# Patient Record
Sex: Female | Born: 1975 | Race: White | Hispanic: No | Marital: Married | Smoking: Former smoker
Health system: Southern US, Community
[De-identification: ages and names within clinical notes are randomized; demographics above are authoritative.]

## PROBLEM LIST (undated history)

## (undated) DIAGNOSIS — Z98891 History of uterine scar from previous surgery: Secondary | ICD-10-CM

## (undated) DIAGNOSIS — G43909 Migraine, unspecified, not intractable, without status migrainosus: Secondary | ICD-10-CM

## (undated) DIAGNOSIS — E739 Lactose intolerance, unspecified: Secondary | ICD-10-CM

## (undated) DIAGNOSIS — G709 Myoneural disorder, unspecified: Secondary | ICD-10-CM

## (undated) DIAGNOSIS — E7849 Other hyperlipidemia: Secondary | ICD-10-CM

## (undated) DIAGNOSIS — E559 Vitamin D deficiency, unspecified: Secondary | ICD-10-CM

## (undated) DIAGNOSIS — D649 Anemia, unspecified: Secondary | ICD-10-CM

## (undated) DIAGNOSIS — R12 Heartburn: Secondary | ICD-10-CM

## (undated) DIAGNOSIS — F419 Anxiety disorder, unspecified: Secondary | ICD-10-CM

## (undated) DIAGNOSIS — K0889 Other specified disorders of teeth and supporting structures: Secondary | ICD-10-CM

## (undated) DIAGNOSIS — N390 Urinary tract infection, site not specified: Secondary | ICD-10-CM

## (undated) DIAGNOSIS — R600 Localized edema: Secondary | ICD-10-CM

## (undated) DIAGNOSIS — F329 Major depressive disorder, single episode, unspecified: Secondary | ICD-10-CM

## (undated) DIAGNOSIS — M797 Fibromyalgia: Secondary | ICD-10-CM

## (undated) DIAGNOSIS — G909 Disorder of the autonomic nervous system, unspecified: Secondary | ICD-10-CM

## (undated) DIAGNOSIS — D219 Benign neoplasm of connective and other soft tissue, unspecified: Secondary | ICD-10-CM

## (undated) DIAGNOSIS — Z8739 Personal history of other diseases of the musculoskeletal system and connective tissue: Secondary | ICD-10-CM

## (undated) DIAGNOSIS — R1310 Dysphagia, unspecified: Secondary | ICD-10-CM

## (undated) DIAGNOSIS — U099 Post covid-19 condition, unspecified: Secondary | ICD-10-CM

## (undated) DIAGNOSIS — K219 Gastro-esophageal reflux disease without esophagitis: Secondary | ICD-10-CM

## (undated) DIAGNOSIS — T7840XA Allergy, unspecified, initial encounter: Secondary | ICD-10-CM

## (undated) DIAGNOSIS — R079 Chest pain, unspecified: Secondary | ICD-10-CM

## (undated) DIAGNOSIS — Z91018 Allergy to other foods: Secondary | ICD-10-CM

## (undated) DIAGNOSIS — R5382 Chronic fatigue, unspecified: Secondary | ICD-10-CM

## (undated) DIAGNOSIS — K589 Irritable bowel syndrome without diarrhea: Secondary | ICD-10-CM

## (undated) DIAGNOSIS — O26899 Other specified pregnancy related conditions, unspecified trimester: Secondary | ICD-10-CM

## (undated) DIAGNOSIS — F32A Depression, unspecified: Secondary | ICD-10-CM

## (undated) DIAGNOSIS — M2550 Pain in unspecified joint: Secondary | ICD-10-CM

## (undated) DIAGNOSIS — I10 Essential (primary) hypertension: Secondary | ICD-10-CM

## (undated) DIAGNOSIS — M549 Dorsalgia, unspecified: Secondary | ICD-10-CM

## (undated) DIAGNOSIS — R0602 Shortness of breath: Secondary | ICD-10-CM

## (undated) DIAGNOSIS — F988 Other specified behavioral and emotional disorders with onset usually occurring in childhood and adolescence: Secondary | ICD-10-CM

## (undated) HISTORY — DX: Irritable bowel syndrome, unspecified: K58.9

## (undated) HISTORY — DX: Fibromyalgia: M79.7

## (undated) HISTORY — DX: Depression, unspecified: F32.A

## (undated) HISTORY — DX: Disorder of the autonomic nervous system, unspecified: G90.9

## (undated) HISTORY — DX: Myoneural disorder, unspecified: G70.9

## (undated) HISTORY — DX: Post covid-19 condition, unspecified: U09.9

## (undated) HISTORY — DX: Allergy to other foods: Z91.018

## (undated) HISTORY — DX: Vitamin D deficiency, unspecified: E55.9

## (undated) HISTORY — PX: OTHER SURGICAL HISTORY: SHX169

## (undated) HISTORY — DX: Gastro-esophageal reflux disease without esophagitis: K21.9

## (undated) HISTORY — DX: Anxiety disorder, unspecified: F41.9

## (undated) HISTORY — PX: POLYPECTOMY: SHX149

## (undated) HISTORY — DX: Lactose intolerance, unspecified: E73.9

## (undated) HISTORY — DX: Personal history of other diseases of the musculoskeletal system and connective tissue: Z87.39

## (undated) HISTORY — DX: Other specified behavioral and emotional disorders with onset usually occurring in childhood and adolescence: F98.8

## (undated) HISTORY — DX: Other hyperlipidemia: E78.49

## (undated) HISTORY — PX: DILATION AND CURETTAGE OF UTERUS: SHX78

## (undated) HISTORY — DX: Chest pain, unspecified: R07.9

## (undated) HISTORY — DX: Chronic fatigue, unspecified: R53.82

## (undated) HISTORY — DX: Other specified disorders of teeth and supporting structures: K08.89

## (undated) HISTORY — DX: Localized edema: R60.0

## (undated) HISTORY — DX: Dorsalgia, unspecified: M54.9

## (undated) HISTORY — DX: Essential (primary) hypertension: I10

## (undated) HISTORY — DX: Dysphagia, unspecified: R13.10

## (undated) HISTORY — DX: Allergy, unspecified, initial encounter: T78.40XA

## (undated) HISTORY — DX: Migraine, unspecified, not intractable, without status migrainosus: G43.909

## (undated) HISTORY — PX: WISDOM TOOTH EXTRACTION: SHX21

## (undated) HISTORY — DX: Major depressive disorder, single episode, unspecified: F32.9

## (undated) HISTORY — PX: COLONOSCOPY: SHX174

## (undated) HISTORY — DX: Shortness of breath: R06.02

## (undated) HISTORY — DX: Pain in unspecified joint: M25.50

---

## 2001-08-06 ENCOUNTER — Other Ambulatory Visit: Admission: RE | Admit: 2001-08-06 | Discharge: 2001-08-06 | Payer: Self-pay | Admitting: Obstetrics and Gynecology

## 2002-10-16 ENCOUNTER — Other Ambulatory Visit: Admission: RE | Admit: 2002-10-16 | Discharge: 2002-10-16 | Payer: Self-pay | Admitting: Obstetrics and Gynecology

## 2003-12-21 ENCOUNTER — Encounter: Admission: RE | Admit: 2003-12-21 | Discharge: 2003-12-21 | Payer: Self-pay | Admitting: Family Medicine

## 2006-02-19 ENCOUNTER — Ambulatory Visit: Payer: Self-pay | Admitting: Internal Medicine

## 2006-03-14 ENCOUNTER — Ambulatory Visit: Payer: Self-pay | Admitting: Internal Medicine

## 2006-03-14 ENCOUNTER — Encounter: Payer: Self-pay | Admitting: Family Medicine

## 2006-03-14 ENCOUNTER — Encounter (INDEPENDENT_AMBULATORY_CARE_PROVIDER_SITE_OTHER): Payer: Self-pay | Admitting: Specialist

## 2006-03-27 ENCOUNTER — Ambulatory Visit: Payer: Self-pay | Admitting: Internal Medicine

## 2007-05-29 ENCOUNTER — Ambulatory Visit (HOSPITAL_COMMUNITY): Admission: RE | Admit: 2007-05-29 | Discharge: 2007-05-29 | Payer: Self-pay | Admitting: Obstetrics and Gynecology

## 2007-05-29 ENCOUNTER — Encounter (INDEPENDENT_AMBULATORY_CARE_PROVIDER_SITE_OTHER): Payer: Self-pay | Admitting: Obstetrics and Gynecology

## 2008-01-06 ENCOUNTER — Inpatient Hospital Stay (HOSPITAL_COMMUNITY): Admission: AD | Admit: 2008-01-06 | Discharge: 2008-01-06 | Payer: Self-pay | Admitting: Obstetrics and Gynecology

## 2008-05-02 ENCOUNTER — Inpatient Hospital Stay (HOSPITAL_COMMUNITY): Admission: AD | Admit: 2008-05-02 | Discharge: 2008-05-07 | Payer: Self-pay | Admitting: Obstetrics and Gynecology

## 2008-05-03 ENCOUNTER — Encounter (INDEPENDENT_AMBULATORY_CARE_PROVIDER_SITE_OTHER): Payer: Self-pay | Admitting: Obstetrics and Gynecology

## 2008-05-12 ENCOUNTER — Encounter: Admission: RE | Admit: 2008-05-12 | Discharge: 2008-06-11 | Payer: Self-pay | Admitting: Obstetrics and Gynecology

## 2008-06-12 ENCOUNTER — Encounter: Admission: RE | Admit: 2008-06-12 | Discharge: 2008-07-09 | Payer: Self-pay | Admitting: Obstetrics and Gynecology

## 2008-07-07 ENCOUNTER — Encounter: Admission: RE | Admit: 2008-07-07 | Discharge: 2008-07-07 | Payer: Self-pay | Admitting: Obstetrics and Gynecology

## 2008-07-10 ENCOUNTER — Encounter: Admission: RE | Admit: 2008-07-10 | Discharge: 2008-08-09 | Payer: Self-pay | Admitting: Obstetrics and Gynecology

## 2008-08-10 ENCOUNTER — Encounter: Admission: RE | Admit: 2008-08-10 | Discharge: 2008-09-08 | Payer: Self-pay | Admitting: Obstetrics and Gynecology

## 2008-09-09 ENCOUNTER — Encounter: Admission: RE | Admit: 2008-09-09 | Discharge: 2008-10-09 | Payer: Self-pay | Admitting: Obstetrics and Gynecology

## 2008-10-10 ENCOUNTER — Encounter: Admission: RE | Admit: 2008-10-10 | Discharge: 2008-11-08 | Payer: Self-pay | Admitting: Obstetrics and Gynecology

## 2008-11-09 ENCOUNTER — Encounter: Admission: RE | Admit: 2008-11-09 | Discharge: 2008-12-09 | Payer: Self-pay | Admitting: Obstetrics and Gynecology

## 2008-12-10 ENCOUNTER — Encounter: Admission: RE | Admit: 2008-12-10 | Discharge: 2009-01-09 | Payer: Self-pay | Admitting: Obstetrics and Gynecology

## 2009-01-10 ENCOUNTER — Encounter: Admission: RE | Admit: 2009-01-10 | Discharge: 2009-02-08 | Payer: Self-pay | Admitting: Obstetrics and Gynecology

## 2009-02-09 ENCOUNTER — Encounter: Admission: RE | Admit: 2009-02-09 | Discharge: 2009-03-11 | Payer: Self-pay | Admitting: Obstetrics and Gynecology

## 2009-03-12 ENCOUNTER — Encounter: Admission: RE | Admit: 2009-03-12 | Discharge: 2009-03-21 | Payer: Self-pay | Admitting: Obstetrics and Gynecology

## 2009-08-29 ENCOUNTER — Ambulatory Visit: Payer: Self-pay | Admitting: Family Medicine

## 2009-08-29 DIAGNOSIS — H6690 Otitis media, unspecified, unspecified ear: Secondary | ICD-10-CM | POA: Insufficient documentation

## 2009-09-20 ENCOUNTER — Ambulatory Visit: Payer: Self-pay | Admitting: Family Medicine

## 2009-09-20 DIAGNOSIS — J019 Acute sinusitis, unspecified: Secondary | ICD-10-CM | POA: Insufficient documentation

## 2009-09-28 ENCOUNTER — Telehealth: Payer: Self-pay | Admitting: Family Medicine

## 2009-10-06 ENCOUNTER — Encounter (INDEPENDENT_AMBULATORY_CARE_PROVIDER_SITE_OTHER): Payer: Self-pay | Admitting: *Deleted

## 2009-10-06 ENCOUNTER — Ambulatory Visit: Payer: Self-pay | Admitting: Family Medicine

## 2009-10-07 LAB — CONVERTED CEMR LAB
AST: 16 units/L (ref 0–37)
Alkaline Phosphatase: 72 units/L (ref 39–117)
Basophils Relative: 0.8 % (ref 0.0–3.0)
Bilirubin, Direct: 0.1 mg/dL (ref 0.0–0.3)
Calcium: 8.8 mg/dL (ref 8.4–10.5)
Eosinophils Absolute: 0.1 10*3/uL (ref 0.0–0.7)
Eosinophils Relative: 1.8 % (ref 0.0–5.0)
Glucose, Bld: 90 mg/dL (ref 70–99)
HDL: 45.9 mg/dL (ref >39.00)
Hemoglobin: 13.3 g/dL (ref 12.0–15.0)
LDL Cholesterol: 109 mg/dL — ABNORMAL HIGH (ref 0–99)
Lymphs Abs: 1.9 10*3/uL (ref 0.7–4.0)
MCHC: 34.2 g/dL (ref 30.0–36.0)
MCV: 88.4 fL (ref 78.0–100.0)
Monocytes Absolute: 0.5 10*3/uL (ref 0.1–1.0)
Neutro Abs: 3.3 10*3/uL (ref 1.4–7.7)
Neutrophils Relative %: 56.7 % (ref 43.0–77.0)
Platelets: 230 10*3/uL (ref 150.0–400.0)
RBC: 4.41 M/uL (ref 3.87–5.11)
RDW: 14 % (ref 11.5–14.6)
Sodium: 143 meq/L (ref 135–145)

## 2009-10-13 ENCOUNTER — Encounter (INDEPENDENT_AMBULATORY_CARE_PROVIDER_SITE_OTHER): Payer: Self-pay | Admitting: *Deleted

## 2009-10-21 ENCOUNTER — Telehealth: Payer: Self-pay | Admitting: Family Medicine

## 2009-10-21 DIAGNOSIS — E041 Nontoxic single thyroid nodule: Secondary | ICD-10-CM | POA: Insufficient documentation

## 2009-10-31 ENCOUNTER — Encounter: Admission: RE | Admit: 2009-10-31 | Discharge: 2009-10-31 | Payer: Self-pay | Admitting: Family Medicine

## 2009-11-09 ENCOUNTER — Encounter (INDEPENDENT_AMBULATORY_CARE_PROVIDER_SITE_OTHER): Payer: Self-pay | Admitting: *Deleted

## 2009-11-09 DIAGNOSIS — IMO0001 Reserved for inherently not codable concepts without codable children: Secondary | ICD-10-CM | POA: Insufficient documentation

## 2009-11-15 ENCOUNTER — Ambulatory Visit: Payer: Self-pay | Admitting: Internal Medicine

## 2009-11-15 DIAGNOSIS — J020 Streptococcal pharyngitis: Secondary | ICD-10-CM | POA: Insufficient documentation

## 2009-11-15 LAB — CONVERTED CEMR LAB: Rapid Strep: POSITIVE

## 2010-04-24 ENCOUNTER — Ambulatory Visit: Payer: Self-pay | Admitting: Family Medicine

## 2010-04-24 LAB — CONVERTED CEMR LAB: Rapid Strep: NEGATIVE

## 2010-05-05 ENCOUNTER — Telehealth: Payer: Self-pay | Admitting: Family Medicine

## 2010-06-06 NOTE — Procedures (Signed)
Summary: Colonoscopy/[facility]  Colonoscopy/[facility]   Imported By: Lanelle Bal 10/13/2009 11:56:48  _____________________________________________________________________  External Attachment:    Type:   Image     Comment:   External Document

## 2010-06-06 NOTE — Progress Notes (Signed)
Summary: Chest Congestion still  Phone Note Call from Patient Call back at Home Phone [phone]   Caller: Patient Summary of Call: Pt called and left a voicemail and stated she saw Dr.Lowne last week and diagnosied with Bronchitis. She was instructed if her chest still hurt at the end of the ATB she needed to call and inform Dr.Charna Neeb. Pt states she has 2 days left on her ATB and her chest is still bothering her. Pt would like to know what to do from now. Does she need a Chest x-ray, or does she need another OV. [name] CMA  Sep 28, 2009 4:26 PM   Follow-up for Phone Call        we can send pt to [facility] for CXR but given that she's on Avelox it is unlikely that there is any bacterial infxn that we are not treating (it kills just about everything!).  if pt feels better but is experiencing hacking cough she may have a post-infectious cough and there's nothing to do for that except give it time and take ibuprofen to decrease airway inflammation.  if pt still has time left on abx i would give this until early next week when she has finished her treatment and then re-evaluate. Follow-up by: Neena Rhymes MD,  Sep 28, 2009 4:34 PM  Additional Follow-up for Phone Call Additional follow up Details #1::        Pt is aware, she is going to try the ibuprofen and finish the antibiotic and call if no better next week. She states she has CPX scheduled next Thursday. [name] CMA  Sep 28, 2009 4:38 PM

## 2010-06-06 NOTE — Assessment & Plan Note (Signed)
Summary: cpx/lab/cbs   Vital Signs:  Patient profile:   35 year old female Height:      63.50 inches Weight:      148 pounds Pulse rate:   66 / minute BP sitting:   104 / 60  (left arm)  Vitals Entered By: Doristine Devoid (October 06, 2009 8:11 AM) CC: CPX AND LAB   History of Present Illness: 35 yo woman here today for CPE.  GYN- Cousins.  no concerns about health  Preventive Screening-Counseling & Management  Alcohol-Tobacco     Alcohol drinks/day: 0     Smoking Status: never  Caffeine-Diet-Exercise     Does Patient Exercise: no      Sexual History:  currently monogamous.        Drug Use:  never.    Current Medications (verified): 1)  None  Allergies (verified): 1)  ! Pcn 2)  ! Erythromycin 3)  ! Sulfa 4)  ! Codeine 5)  ! Jonne Ply  Past History:  Past Medical History: Last updated: 08/29/2009 fibromyalgia hx of migraines G3 P2 (twins) miscarriage x 2  Past Surgical History: Last updated: 08/29/2009 Caesarean section (twins)  Family History: Last updated: 08/29/2009 GF - CHF GF - MI x 4, stroke x 1 M. family -- everyone >64 years old has DM breast ca - no  Past History:  Care Management: Gynecology: Dr. 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    Physical Exam  General:  Well-developed,well-nourished,in no acute distress; alert,appropriate and cooperative throughout examination Head:  Normocephalic and atraumatic without obvious  abnormalities. No apparent alopecia or balding. Eyes:  No corneal or conjunctival inflammation noted. EOMI. Perrla. Funduscopic exam benign, without hemorrhages, exudates or papilledema. Vision grossly normal. Ears:  External ear exam shows no significant lesions or deformities.  Otoscopic examination reveals clear canals, tympanic membranes are intact bilaterally without bulging, retraction, inflammation or discharge. Hearing is grossly normal bilaterally. Nose:  External nasal examination shows no deformity or inflammation. Nasal mucosa are pink and moist without lesions or exudates. Mouth:  Oral mucosa and oropharynx without lesions or exudates.  Teeth in good repair. Neck:  diffuse thyroid fullness, no obvious nodules/masses Breasts:  deferred to gyn Lungs:  Normal respiratory effort, chest expands symmetrically. Lungs are clear to auscultation, no crackles or wheezes. Heart:  Normal rate and regular rhythm. S1 and S2 normal without gallop, murmur, click, rub or other extra sounds. Abdomen:  Bowel sounds positive,abdomen soft and non-tender without masses, organomegaly or hernias noted. Genitalia:  deferred to gyn Msk:  No deformity or scoliosis noted of thoracic or lumbar spine.   Pulses:  +2 carotid, radial, DP Extremities:  No clubbing, cyanosis, edema, or deformity noted with normal full range of motion of all joints.   Neurologic:  No cranial nerve deficits noted. Station and gait are normal. Plantar reflexes are down-going bilaterally. DTRs are symmetrical throughout. Sensory, motor and coordinative functions appear intact. Skin:  Intact without suspicious lesions or rashes Cervical Nodes:  No lymphadenopathy noted Axillary Nodes:  No palpable lymphadenopathy Psych:  Cognition and judgment appear  intact. Alert and cooperative with normal attention span and concentration. No apparent delusions, illusions, hallucinations   Impression & Recommendations:  Problem # 1:  PHYSICAL  EXAMINATION (ICD-V70.0) Assessment New pt's PE WNL w/ exception of thyroid fullness.  check labs.  UTD on GYN screening.  anticipatory guidance provided. Orders: Venipuncture (28315) TLB-Lipid Panel (80061-LIPID) TLB-BMP (Basic Metabolic Panel-BMET) (80048-METABOL) TLB-CBC Platelet - w/Differential (85025-CBCD) TLB-Hepatic/Liver Function Pnl (80076-HEPATIC) TLB-TSH (Thyroid Stimulating Hormone) (84443-TSH) T-Vitamin D (25-Hydroxy) (17616-07371)  Patient Instructions: 1)  We'll notify you of your lab results 2)  Keep up the good work on diet and try and get some regular exercise- you look great! 3)  Call with any questions or concerns 4)  Have a great summer!!

## 2010-06-06 NOTE — Miscellaneous (Signed)
  Clinical Lists Changes  Medications: Added new medication of VITAMIN D (ERGOCALCIFEROL) 50000 UNIT CAPS (ERGOCALCIFEROL) take one tablet weekly x8 weeks - Signed Rx of VITAMIN D (ERGOCALCIFEROL) 50000 UNIT CAPS (ERGOCALCIFEROL) take one tablet weekly x8 weeks;  #8 x 0;  Signed;  Entered by: Doristine Devoid;  Authorized by: Neena Rhymes MD;  Method used: Electronically to [id number] [address], [place], [place]  [address], Ph: [phone] or [phone], Fax: [phone]    Prescriptions: VITAMIN D (ERGOCALCIFEROL) 50000 UNIT CAPS (ERGOCALCIFEROL) take one tablet weekly x8 weeks  #8 x 0   Entered by:   Doristine Devoid   Authorized by:   Neena Rhymes MD   Signed by:   Doristine Devoid on 10/13/2009   Method used:   Electronically to        [id number] (retail)       [address]       [place], [place]  [address]       Ph: [phone] or [phone]       Fax: [phone]   RxID:   [id number]

## 2010-06-06 NOTE — Miscellaneous (Signed)
  Clinical Lists Changes  Problems: Added new problem of FIBROMYALGIA (ICD-729.1) 

## 2010-06-06 NOTE — Assessment & Plan Note (Signed)
Summary: sinus infection still bad//lch   Vital Signs:  Patient profile:   35 year old female Weight:      147 pounds Temp:     98.1 degrees F oral Pulse rate:   76 / minute Pulse rhythm:   regular BP sitting:   110 / 72  (left arm) Cuff size:   regular  Vitals Entered By: [name] CMA (Sep 20, 2009 8:13 AM) CC: Pt here c/o HA, chest and head congestion x 5 weeks., URI symptoms   History of Present Illness:       This is a 35 year old woman who presents with URI symptoms.  The symptoms began 4-8 weeks ago.  Pt never got better from last visit.   Pt took z pack and sudafed with no relief. Pt used Mucinex DM which helped  some but stopped.  She also tried delsym with no relief.  The patient complains of nasal congestion, purulent nasal discharge, productive cough, and sick contacts, but denies sore throat and earache.  Associated symptoms include fever.  The patient denies low-grade fever (<100.5 degrees), fever of 100.5-103 degrees, fever of 103.1-104 degrees, fever to >104 degrees, stiff neck, dyspnea, wheezing, rash, vomiting, diarrhea, use of an antipyretic, and response to antipyretic.  The patient also reports headache and muscle aches.  The patient denies itchy watery eyes, itchy throat, sneezing, seasonal symptoms, response to antihistamine, and severe fatigue.  The patient denies the following risk factors for Strep sinusitis: unilateral facial pain, unilateral nasal discharge, poor response to decongestant, double sickening, tooth pain, Strep exposure, tender adenopathy, and absence of cough.    Allergies: 1)  ! Pcn 2)  ! Erythromycin 3)  ! Sulfa 4)  ! Codeine 5)  ! Jonne Ply  Past History:  Past Medical History: Last updated: 08/29/2009 fibromyalgia hx of migraines G3 P2 (twins) miscarriage x 2  Past Surgical History: Last updated: 08/29/2009 Caesarean section (twins)  Family History: Last updated: 08/29/2009 GF - CHF GF - MI x 4, stroke x 1 M. 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 GF - CHF GF - MI x 4, stroke x 1 M. family -- everyone >39 years old has DM breast ca - no  Social History: Reviewed history from 08/29/2009 and no changes required. 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  Otoscopic examination reveals clear canals, tympanic membranes are intact bilaterally without bulging, retraction, inflammation or discharge. Hearing is grossly normal bilaterally. Nose:  no external deformity, L frontal sinus tenderness, and R frontal sinus tenderness.   Mouth:  Oral mucosa and oropharynx without lesions or exudates.  Teeth in good repair. Neck:  No deformities, masses, or tenderness noted. Lungs:  R decreased breath sounds and L decreased breath sounds.   Heart:  normal rate and no murmur.   Cervical Nodes:  No lymphadenopathy noted Psych:  Cognition and judgment appear intact. Alert and cooperative with normal attention span and concentration. No apparent delusions, illusions, hallucinations   Impression & Recommendations:  Problem # 1:  BRONCHITIS-  ACUTE (ICD-466.0)  Her updated medication list for this problem includes:    Avelox 400 Mg Tabs (Moxifloxacin hcl) .Marland Kitchen... 1 by mouth once daily     Use Mucinex DM for cough and congestion  Take antibiotics and other medications as directed. Encouraged to push clear liquids, get enough rest, and take acetaminophen as needed. To be seen in 5-7 days if no improvement, sooner if  worse.  Problem # 2:  SINUSITIS - ACUTE-NOS (ICD-461.9)  Her updated medication list for this problem includes:    Avelox 400 Mg Tabs (Moxifloxacin hcl) .Marland Kitchen... 1 by mouth once daily    Veramyst 27.5 Mcg/spray Susp (Fluticasone furoate) .Marland Kitchen... 2 sprays each nostril once daily  Instructed on treatment. Call if symptoms persist or worsen.   Complete Medication List: 1)  Avelox 400 Mg Tabs (Moxifloxacin hcl) .Marland Kitchen.. 1 by mouth once daily 2)  Veramyst 27.5 Mcg/spray Susp (Fluticasone furoate) .... 2 sprays each nostril once daily Prescriptions: AVELOX 400 MG TABS (MOXIFLOXACIN HCL) 1 by mouth once daily  #10 x 0   Entered and Authorized by:   Loreen Freud DO   Signed by:   Loreen Freud DO on 09/20/2009   Method used:   Electronically to        [id number] (retail)       [address]       [place], [place]  [address]       Ph: [phone] or [phone]       Fax: [phone]   RxID:   [id number]

## 2010-06-06 NOTE — Assessment & Plan Note (Signed)
Summary: NEW "ACUTE ONLY" SINUS INF,BCBS/RH......   Vital Signs:  Patient profile:   35 year old female Height:      63.5 inches Weight:      148.2 pounds BMI:     25.93 Temp:     97.2 degrees F BP sitting:   100 / 60  Vitals Entered By: Shary Decamp (August 29, 2009 11:48 AM) CC: new pt acute only - cold sxs, ?sinus infection Is Patient Diabetic? No   History of Present Illness: 35 yo woman here today to establish care.  previous MD- Kindl.  pt reports 2 weeks of cough, laryngitis x3.  started w/ fever, sore throat.  also c/o diarrhea- improved w/ immodium.  + nausea, no vomiting.  decreased appetite.  pt denies facial pain or pressure but does note increased migraines.  + ear pain, R>L.  pt reports most bothersome sxs is the sore throat.  + sick contacts.  Preventive Screening-Counseling & Management  Alcohol-Tobacco     Alcohol drinks/day: 0     Smoking Status: never  Current Medications (verified): 1)  Azithromycin 250 Mg  Tabs (Azithromycin) .... 2 By  Mouth Today and Then 1 Daily For 4 Days.  Pt Reports Having Had This in Past  Allergies (verified): 1)  ! Pcn 2)  ! Erythromycin 3)  ! Sulfa 4)  ! Codeine 5)  ! Jonne Ply  Past History:  Past Medical History: fibromyalgia hx of migraines G3 P2 (twins) miscarriage x 2  Past Surgical History: Caesarean section (twins)  Family History: GF - CHF GF - MI x 4, stroke x 1 M. 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  Lungs are clear to auscultation, no crackles or wheezes. Heart:  Normal rate and regular rhythm. S1 and S2 normal without gallop, murmur, click, rub or other extra sounds.   Impression & Recommendations:  Problem # 1:  OTITIS MEDIA (ICD-382.9) Assessment New pt's w/ bilateral ear infxns on exam.  pt's multiple allergies prove to be difficult re: abx choices.  she reports she has had Zpack in past w/out difficulty.  will tx w/ Zpack, pt to call if problems tolerating meds.  reviewed supportive care and red flags that should prompt return.  Pt expresses understanding and is in agreement w/ this plan. Her updated medication list for this problem includes:    Azithromycin 250 Mg Tabs (Azithromycin) .Marland Kitchen... 2 by  mouth today and then 1 daily for 4 days.  pt reports having had this in past  Complete Medication List: 1)  Azithromycin 250 Mg Tabs (Azithromycin) .... 2 by  mouth today and then 1 daily for 4 days.  pt reports having had this in past  Patient Instructions: 1)  Please schedule your physical at your convenience 2)  Take the Azithromycin as directed for the ear infections- call if you are unable to tolerate it 3)  Add an OTC decongestant 4)  Ibuprofen for pain and/or fever 5)  Drink plenty of fluids 6)  Hang in there! Prescriptions:  AZITHROMYCIN 250 MG  TABS (AZITHROMYCIN) 2 by  mouth today and then 1 daily for 4 days.  pt reports having had this in past  #6 x 0   Entered and Authorized by:   Neena Rhymes MD   Signed by:   Neena Rhymes MD on 08/29/2009   Method used:   Print then Give to Patient   RxID:   [id number]

## 2010-06-06 NOTE — Progress Notes (Signed)
Summary: thyroid ultrasound  Phone Note Call from Patient Call back at Home Phone [phone]   Caller: Patient Summary of Call: patient left msg on vm says she just had her visit w/ gyn who also noted enlarged thyroid patient was told to contact us to set ultrasound.  viewed note from 10/06/09 put in referral for ultrasound. Initial call taken by: Doristine Devoid,  October 21, 2009 9:35 AM  Follow-up for Phone Call        agree.  thank you. Follow-up by: Neena Rhymes MD,  October 21, 2009 10:01 AM  New Problems: THYROMEGALY (ICD-240.9)   New Problems: THYROMEGALY (ICD-240.9)  Appended Document: thyroid ultrasound patient aware ultrasound in process

## 2010-06-06 NOTE — Miscellaneous (Signed)
Summary: colonoscopy   Clinical Lists Changes  Observations: Added new observation of COLONOSCOPY: Pathology:  Adenomatous polyp.        Location:  [facility].   (03/14/2006 16:21) Added new observation of TD BOOSTER: Historical (10/31/1993 16:18) Added new observation of MMR #2: Historical (12/22/1987 16:18) Added new observation of OPV #4: Historical (03/16/1981 16:18) Added new observation of DPT #5: Historical (03/16/1981 16:18) Added new observation of OPV #3: Historical (08/01/1977 16:18) Added new observation of DPT #4: Historical (07/12/1977 16:18) Added new observation of MMR #1: Historical (05/17/1977 16:18) Added new observation of DPT #3: Historical (07/20/1976 16:18) Added new observation of OPV #2: Historical (05/18/1976 16:18) Added new observation of DPT #2: Historical (05/18/1976 16:18) Added new observation of OPV #1: Historical (03/16/1976 16:18) Added new observation of DPT #1: Historical (03/16/1976 16:18)      Immunization History:  DPT Immunization History:    DPT # 1:  historical (03/16/1976)    DPT # 2:  historical (05/18/1976)    DPT # 3:  historical (07/20/1976)    DPT # 4:  historical (07/12/1977)    DPT # 5:  historical (03/16/1981)  Polio Immunization History:    Polio # 1:  historical (03/16/1976)    Polio # 2:  historical (05/18/1976)    Polio # 3:  historical (08/01/1977)    Polio # 4:  historical (03/16/1981)  MMR Immunization History:    MMR # 1:  historical (05/17/1977)    MMR # 2:  historical (12/22/1987)  Tetanus/Td Immunization History:    Tetanus/Td:  historical (10/31/1993)        Colonoscopy  Procedure date:  03/14/2006  Findings:      Pathology:  Adenomatous polyp.        Location:  [facility].

## 2010-06-06 NOTE — Assessment & Plan Note (Signed)
Summary: sore throat/cbs   Vital Signs:  Patient profile:   35 year old female Weight:      149.6 pounds BMI:     26.18 Temp:     98.6 degrees F oral Pulse rate:   84 / minute Resp:     14 per minute BP sitting:   100 / 62  (left arm) Cuff size:   large  Vitals Entered By: Shonna Chock CMA (November 15, 2009 1:59 PM) CC: Sore throat since Saturday: swollen glands, fatigue, and ? fever (took nap x 2 yesterday and woke up both times sweating), URI symptoms Comments REVIEWED MED LIST, PATIENT AGREED DOSE AND INSTRUCTION CORRECT    Primary Care Provider:  Beverely Low  CC:  Sore throat since Saturday: swollen glands, fatigue, and ? fever (took nap x 2 yesterday and woke up both times sweating), and URI symptoms.  History of Present Illness: Onset 11/12/2009 as ST; Fibromyalgia flare 07/10 with ST progression. Rx: Mucinex, Nyquil.  The patient reports dry cough, but denies nasal congestion, purulent nasal discharge, and earache.  Associated symptoms include fever, low-grade fever (<100.5 degrees), and stiff neck.  The patient denies dyspnea, wheezing, rash, vomiting, and diarrhea.  The patient also reports muscle aches and severe fatigue.  The patient denies headache.  Risk factors for Strep sinusitis include tender adenopathy.  The patient denies the following risk factors for Strep sinusitis: unilateral facial pain, unilateral nasal discharge, and tooth pain.    Allergies: 1)  ! Pcn 2)  ! Erythromycin 3)  ! Sulfa 4)  ! Codeine 5)  ! Asa  Physical Exam  General:  well-nourished,in no acute distress; alert,appropriate and cooperative throughout examination Eyes:  No corneal or conjunctival inflammation noted. Perrla.  Ears:  External ear exam shows no significant lesions or deformities.  Otoscopic examination reveals clear canals, tympanic membranes are intact bilaterally without bulging, retraction, inflammation or discharge. Hearing is grossly normal bilaterally. Nose:  External nasal  examination shows no deformity or inflammation. Nasal mucosa are pink and moist without lesions or exudates. Mouth:  Marked pharyngeal erythema bilaterally.   Lungs:  Normal respiratory effort, chest expands symmetrically. Lungs are clear to auscultation, no crackles or wheezes. Heart:  Normal rate and regular rhythm. S1 and S2 normal without gallop, murmur, click, rub or other extra sounds. Cervical Nodes:  Shotty LA L >R Axillary Nodes:  No palpable lymphadenopathy   Impression & Recommendations:  Problem # 1:  STREP THROAT (ICD-034.0)  Her updated medication list for this problem includes:    Azithromycin 250 Mg Tabs (Azithromycin) .Marland Kitchen... As per pack  Problem # 2:  FIBROMYALGIA (ICD-729.1) flare with #1  Complete Medication List: 1)  Vitamin D (ergocalciferol) 50000 Unit Caps (Ergocalciferol) .... Take one tablet weekly x8 weeks 2)  Azithromycin 250 Mg Tabs (Azithromycin) .... As per pack  Other Orders: Rapid Strep (72536)  Patient Instructions: 1)  Drink as much fluid as you can tolerate for the next few days. 2)  Take 650-1000mg  of Tylenol every 4-6 hours as needed for relief of pain or comfort of fever AVOID taking more than 4000mg   in a 24 hour period (can cause liver damage in higher doses) OR take 400-600mg  of Ibuprofen (Advil, Motrin) with food every 4-6 hours as needed for relief of pain or comfort of fever.Zicam  or Zinc lozenges as needed for sore throat  Prescriptions: AZITHROMYCIN 250 MG TABS (AZITHROMYCIN) as per pack  #1 x 0   Entered and Authorized by:   Chrissie Noa  Hopper MD   Signed by:   Marga Melnick MD on 11/15/2009   Method used:   Faxed to ...       [id number] (retail[facility]       [address]       [place], [place]  [address]       Ph: [phone] or [phone]       Fax: [phone]   RxID:   [id number]   Laboratory Results    Other Tests  Rapid Strep: positive

## 2010-06-08 NOTE — Progress Notes (Signed)
Summary: still no better  Phone Note Call from Patient Call back at Home Phone [phone]   Caller: Patient Summary of Call: Pt recently seen, finished med and is still no better. Pt still c/o cough and sore throat. Pls advise..........Marland KitchenFelecia Deloach CMA  May 05, 2010 11:35 AM   Follow-up for Phone Call        given that pt has a cough it is unlikely that she has strep.  she completed a Zpack which would treat a bronchitis if it was bacterial.  most likely a viral infection and will improve w/ time.  should take ibuprofen for the sore throat and use Delsym for the cough. Follow-up by: Neena Rhymes MD,  May 05, 2010 11:45 AM  Additional Follow-up for Phone Call Additional follow up Details #1::        Discuss with patient .........Marland KitchenFelecia Deloach CMA  May 05, 2010 3:19 PM

## 2010-06-08 NOTE — Assessment & Plan Note (Signed)
Summary: strep throat?//lch   Vital Signs:  Patient profile:   35 year old female Weight:      155 pounds BMI:     27.12 Temp:     98.3 degrees F oral BP sitting:   110 / 70  (left arm)  Vitals Entered By: Doristine Devoid CMA (April 24, 2010 1:16 PM) CC: sore throat and L ear pain   History of Present Illness: 35 yo woman here today for sore throat and L ear pain.  + exposure to strep throat.  mother in law getting chemo and pt is going to visit her next week.  no fever.  + nasal congestion.  no cough.  has blisters and 'sore spots' on L side of tongue.  daughter is also sick.  upon questioning revealed that she did have rash on hands  ~1 week ago.  Current Medications (verified): 1)  Azithromycin 250 Mg  Tabs (Azithromycin) .... 2 By  Mouth Today and Then 1 Daily For 4 Days  Allergies (verified): 1)  ! Pcn 2)  ! Erythromycin 3)  ! Sulfa 4)  ! Codeine 5)  ! Asa  Review of Systems      See HPI  Physical Exam  General:  well-nourished,in no acute distress; alert,appropriate and cooperative throughout examination Head:  Normocephalic and atraumatic without obvious abnormalities. No apparent alopecia or balding.  no TTP over sinuses Eyes:  no injxn or inflammation Ears:  External ear exam shows no significant lesions or deformities.  Otoscopic examination reveals clear canals, tympanic membranes are intact bilaterally without bulging, retraction, inflammation or discharge. Hearing is grossly normal bilaterally. Nose:  External nasal examination shows no deformity or inflammation. Nasal mucosa are pink and moist without lesions or exudates. Mouth:  + pharyngeal erythema, no tonsilar enlargement or exudate.  + ulcer on L lateral border of tongue Neck:  + shotty LAD Lungs:  Normal respiratory effort, chest expands symmetrically. Lungs are clear to auscultation, no crackles or wheezes. Heart:  Normal rate and regular rhythm. S1 and S2 normal without gallop, murmur, click, rub or  other extra sounds.   Impression & Recommendations:  Problem # 1:  PHARYNGITIS-ACUTE (ICD-462) Assessment New  despite (-) rapid strep will tx w/ abx given that she has been exposed and will be visiting chemo pt.  discussed possibility of hand/foot/mouth which is viral and won't improve w/ abx.  discussed supportive care and illness prevention techniques for her visit- including masks and hand washing.  Pt expresses understanding and is in agreement w/ this plan. The following medications were removed from the medication list:    Azithromycin 250 Mg Tabs (Azithromycin) .Marland Kitchen... As per pack Her updated medication list for this problem includes:    Azithromycin 250 Mg Tabs (Azithromycin) .Marland Kitchen... 2 by  mouth today and then 1 daily for 4 days  Orders: Rapid Strep ([address])  Complete Medication List: 1)  Azithromycin 250 Mg Tabs (Azithromycin) .... 2 by  mouth today and then 1 daily for 4 days  Patient Instructions: 1)  Take the Azithromycin as directed 2)  Drink plenty of fluids 3)  Tylenol/Ibuprofen as needed for pain or fever 4)  Rest as you are able 5)  Call with any questions or concerns 6)  Happy Holidays! 7)  Have a safe trip!!! Prescriptions: AZITHROMYCIN 250 MG  TABS (AZITHROMYCIN) 2 by  mouth today and then 1 daily for 4 days  #6 x 0   Entered and Authorized by:   Neena Rhymes MD  Signed by:   Neena Rhymes MD on 04/24/2010   Method used:   Electronically to        [facility] (retail)       [address]       [place], [place]  [address]       Ph: [phone] or [phone]       Fax: [phone]   RxID:   [id number]    Orders Added: 1)  Est. Patient Level III [[address]] 2)  Rapid Strep [66440]    Laboratory Results    Wet [place]/KOH  Other Tests  Rapid Strep: negative  Kit Test Internal QC: Positive   (Normal Range: Negative)

## 2010-09-19 ENCOUNTER — Encounter: Payer: Self-pay | Admitting: Family Medicine

## 2010-09-19 NOTE — Discharge Summary (Signed)
NAME:  Abigail Singh, Abigail Singh              ACCOUNT NO.:  [id number]   MEDICAL RECORD NO.:  [id number]          PATIENT TYPE:  INP   LOCATION:  9320                          FACILITY:  [facility]   PHYSICIAN:  Maxie Better, M.D.DATE OF BIRTH:  06/16/1975   DATE OF ADMISSION:  05/02/2008  DATE OF DISCHARGE:  05/07/2008                               DISCHARGE SUMMARY   ADMISSION DIAGNOSES:  1. Twin gestation at 27-6/7th weeks.  2. Breech-breech presentation.  3. Preterm labor.   DISCHARGE DIAGNOSES:  1. Preterm twin gestation, delivered.  2. Breech-breech presentation.  3. Intrauterine gestation at 28 weeks.  4. Preterm labor unstoppable.   PROCEDURE:  The primary cesarean section Kerr hysterotomy.   HISTORY OF PRESENT ILLNESS:  A 35 year old gravida 2, para 0-0-1-0  female at 27-6/7th weeks, twin gestation, admitted in preterm labor  after the patient presented with complaint of cramping and was found to  be 3, 70% bulging membranes, breech presentation.  She had to that point  an uncomplicated prenatal course.  Fetal fibronectin was performed.   HOSPITAL COURSE:  The patient was admitted.  She was given  betamethasone, started on magnesium sulfate.  NICU consult was obtained.  Sonogram for estimated fetal weight was also obtained.  The patient was  started on Macrobid for possible urinary tract infection.  Group B strep  culture was performed.  Magnesium for tocolysis as stated was done.  Fetal fibronectin subsequently was positive.  Urine had shown large  blood with few bacteria and Macrobid was started pending a urine  culture.  Ultrasound showed estimated fetal weight of 2 pounds 5 ounces  and 2 pounds 3 ounces footling breech x2.  On May 03, 2008, the  contractions had resolved, good fetal movement, symptoms showed the  magnesium sulfate was noted for the patient.  Repeat pelvic exam showed  that she was 9, dilated with foot palpable and intact bag.  Decision was  then made to  perform a primary cesarean section.  C-section was done  kerr hysterotomy.  Live female x2, Apgars 9 and 9 x2, weight 1 pound 15  ounces, 2 pounds 3 ounces.  Some subserosal fibroids were noted.  Posterior fibroids were noted and large ovaries were noted bilaterally,  normal tubes bilaterally.  Postoperatively, the magnesium was not  continued.  The patient did well postoperatively.  The babies were in  the NICU for prematurity.  Urine culture was continued while awaiting  cultures.  Placenta x2 was noted.  Postop day #3, the patient was  tolerating regular diet and no evidence of infection.  Hemoglobin of  10.6, hematocrit 30.8, white count of 12.7, platelet count of 157,000.  Urine culture was negative.  Group B strep culture was also negative.  The patient complained of anterior thigh numbness and tingling.  She was  seen by Anesthesia, who thought this was possibly related to retraction.  The patient has a neurologist and plan to follow up postpartum on her  own.  By postop day #4, the patient was deemed well to be discharged  home.   DISPOSITION:  Home.   CONDITION:  Stable.   DISCHARGE MEDICATIONS:  1. Percocet 1-2 every 4-6 hours p.r.n. pain.  2. Prenatal vitamins 1 p.o. daily.  3. Ibuprofen 600 mg 1 every 6 hours p.r.n. pain.   FOLLOWUP APPOINTMENT:  At [facility] in 6 weeks.   DISCHARGE INSTRUCTIONS:  Per the postpartum booklet given.      Maxie Better, M.D.  Electronically Signed     [place]/MEDQ  D:  06/27/2008  T:  06/28/2008  Job:  [id number]

## 2010-09-19 NOTE — Op Note (Signed)
NAME:  Abigail Singh, Abigail Singh              ACCOUNT NO.:  [id number]   MEDICAL RECORD NO.:  [id number]          PATIENT TYPE:  INP   LOCATION:  9320                          FACILITY:  [facility]   PHYSICIAN:  Maxie Better, M.D.DATE OF BIRTH:  1975-10-03   DATE OF PROCEDURE:  05/03/2008  DATE OF DISCHARGE:                               OPERATIVE REPORT   PREOPERATIVE DIAGNOSES:  1. Unstoppable preterm labor.  2. Advanced cervical dilatation.  3. Footling breech presentation x2.  4. Twin gestation at 28 weeks.   POSTOPERATIVE DIAGNOSES:  1. Footling breech presentation x2.  2. Twin gestation at 40 weeks,delivered.  3. Unstoppable preterm labor.   PROCEDURE:  1. Primary cesarean section, Kerr hysterotomy.   ANESTHESIA:  Spinal.   SURGEON:  Maxie Better, MD   ASSISTANT:  Marlinda Mike, CNM   INDICATIONS:  A 35 year old gravida 2, para 0-0-1-0 married white female  admitted on May 02, 2008, with twin gestation and cervical  dilatation of 3 cm with a known breech presentation x2.  The patient was  started on magnesium sulfate.  She received the dose of betamethasone to  accelerate lung maturity.  NICU consult was obtained.  The patient  underwent ultrasound that confirmed breech-breech, 2 pounds 5 ounces and  2 pounds 3 ounces.  The patient was started on clindamycin for unknown  group B strep culture.  A group B strep had been done.  An urine culture  was sent.  The urine had large heme suspicious for possible urinary  tract infection with a resultant cause for premature contractions.  The  patient was admitted to the Antepartum Service.  Magnesium sulfate was  started.  She received a dose of sublingual Procardia due to ongoing  contractions.  When the magnesium level came back in the low 4s, the  patient received an additional bolus of 2 g of magnesium sulfate and  kept her magnesium rate at 2 g per hour.  On the morning of May 03, 2008, the patient felt well.  We  thought her contractions have  significantly improved with a change in the regimen.  She had been  started on Motrin, also to help decrease the preterm labor process.  Vaginal exam was performed and the patient was found to be 9 cm bulging  membranes with palpable fetal parts.  Decision was then made to proceed  with urgent primary cesarean section.  Her Foley catheter was draining  blood-tinged urine.  Consent was signed after the risk of the surgical  procedure had been discussed with the patient and her husband.  The  patient was transferred to the operating room.   DESCRIPTION OF PROCEDURE:  Under adequate spinal anesthesia, the patient  was placed in the supine position with the left lateral tilt.  She was  sterilely prepped and draped in usual fashion.  Indwelling Foley  catheter was already in place.  A 0.25% Marcaine was injected along the  planned Pfannenstiel skin incision site.  Skin incision was then made  and carried down through the rectus fascia.  Rectus fascia was opened  transversely.  Rectus fascia  was then bluntly and sharply dissected off  the rectus muscle in superior and inferior fashion.  The rectus muscles  were split in midline.  The parietal peritoneum was entered sharply and  extended.  Vesicouterine peritoneum was opened transversely.  The  bladder was then bluntly dissected off the lower uterine segment and  displaced inferiorly.  While the lower uterine segment was encountered,  a curvilinear low-transverse uterine incision was then made and extended  with bandage scissors.  The membranes remained intact.  Both sac  adjacent to each other was visualized.  Right sac was artificially  ruptured after the location of the feet of the baby.  Subsequent  delivery was done.  Cord was clamped and cut.  The baby was transferred  to the waiting pediatrician who assigned Apgars of 9 and 9.  Artificial  rupture of membranes of second twin sac was then done.  Breech  delivery  of a live female infant was then done.  Cord was clamped and cut.  The  baby was transferred to the awaiting pediatrician who assigned Apgars of  9 and 9 as well.  The placenta was manually removed x2 and sent to  Pathology.  Uterine cavity was cleaned of debris.  Uterine incision had  no extension was closed in 2 layers, the first layer with 0-Monocryl  running locked stitch and second layer with imbricating 0-Monocryl  suture.  Good hemostasis was noted.  Both tubes were noted to be normal.  Both ovaries were noted to be enlarged consistent with polycystic  ovaries.  The abdomen was copiously irrigated and suctioned of debris.  The parietal peritoneum was closed with 2-0 Vicryl.  The rectus fascia  was closed with 0 Vicryl x2.  The subcutaneous area was irrigated.  Small bleeders were cauterized.  Interrupted 2-0 plain suture was placed  and the skin approximated using Ethicon staples.  Specimen was placenta  x2, sent to Pathology.  Estimated blood loss was a liter.  The urine  output was 225 mL.  Intraoperative fluid 3700 mL.  Sponge and instrument  counts x2 was correct.  Complication was none.  Baby was transferred to  the Neonatal Intensive Care due to the prematurity.  The patient  tolerated the procedure well and was transferred to recovery room in  stable condition.      Maxie Better, M.D.  Electronically Signed     [place]/MEDQ  D:  05/03/2008  T:  05/04/2008  Job:  [id number]

## 2010-09-19 NOTE — Op Note (Signed)
NAME:  Abigail Singh, Abigail Singh              ACCOUNT NO.:  [id number]   MEDICAL RECORD NO.:  [id number]          PATIENT TYPE:  AMB   LOCATION:  SDC                           [facility]ACILITY:  [facility]   PHYSICIAN:  Maxie Better, M.D.DATE OF BIRTH:  Oct 27, 1975   DATE OF PROCEDURE:  05/29/2007  DATE OF DISCHARGE:                               OPERATIVE REPORT   PREOPERATIVE DIAGNOSIS:  Blighted ovum.   POSTOPERATIVE DIAGNOSIS:  Blighted ovum.   PROCEDURE:  Ultrasound-guided suction, dilation and evacuation.   ANESTHESIA:  MAC paracervical block.   SURGEON:  Maxie Better, M.D.   INDICATIONS:  A 31-year gravida 1, para 0 female who was found to have a  blighted ovum on ultrasound today after she presented for initial  obstetrical visit and who now desires surgical management.  The risks of  the procedure have been explained and consent was signed. The patient  was transferred to the operating room.   DESCRIPTION OF PROCEDURE:  Under adequate monitored anesthesia, the  patient was placed in the dorsal lithotomy position.  She was sterilely  prepped and draped in the usual fashion.  The bladder was catheterized  with a large amount of urine.  A bivalve speculum was placed in the  vagina.  20 mL of 1% Nesacaine was injected paracervically at 3 and 9  o'clock.  The anterior lip of the cervix was grasped with a single-tooth  tenaculum.  The cervix was then carefully dilated up to a #27 Pratt  dilator. Under ultrasound guidance, a #7 mm suction cannula was  introduced into the uterine cavity. Products of conception were  suctioned. The cavity was subsequently curetted and resuctioned until  all tissue was felt to have been removed at which time all instruments  were removed from the vagina.  Specimen labeled products of conception  was sent to pathology.  Estimated blood loss was minimal.  Complications  none.  The patient tolerated the procedure well and was transferred to  recovery in stable  condition.      Maxie Better, M.D.  Electronically Signed     [place]/MEDQ  D:  05/29/2007  T:  05/30/2007  Job:  [id number]

## 2010-09-22 NOTE — Assessment & Plan Note (Signed)
[facility]                           GASTROENTEROLOGY OFFICE NOTE   NAME:Abigail Singh, Abigail Singh                     MRN:          [id number]  DATE:02/19/2006                            DOB:          20-Mar-1976    REFERRED BY:  Bradd Canary, MD   REASON FOR CONSULTATION:  Rectal bleeding, diarrhea and weight loss.   HISTORY:  This is a 35 year old white female with a history of chronic  migraine headaches, fibromyalgia, anxiety with depression, and presumed  irritable bowel syndrome. She is referred through the courtesy of Dr. Artis Flock  regarding the above-listed issues and consideration of colonoscopy. The  patient reports ten year history of intermittent rectal bleeding generally  associated with rectal discomfort and attributed to a fissure. As well, she  has had chronic problems with loose stools and mentions noticing undigested  foods in her stools such as lettuce, tomatoes and beans. No nocturnal  symptoms. As well, some lower abdominal discomfort, which tends to be  relieved after defecation. She has had a change in her diet and associated  weight loss. She does not feel that she can contribute all of her weight  loss strictly to diet. Some nausea, though no vomiting, heartburn or upper  abdominal pain. Some bloating. Laboratories with Dr. Artis Flock February 06, 2006,  were normal including CBC with differential, comprehensive metabolic panel  and thyroid stimulating hormone. In terms of bleeding, this has been more  prominent recently and occasionally has been described as severe. It does  not occur with every bowel movement.   PAST MEDICAL HISTORY:  As above.   PAST SURGICAL HISTORY:  Bilateral myringotomy tubes as a child.   DRUG ALLERGIES:  PENICILLIN, SULFA, ERYTHROMYCIN, TETRACYCLINE, ASPIRIN AND  CODEINE.   CURRENT MEDICATIONS:  Adderall 10 mg daily, Topamax 100 mg at night,  Restoril 15 mg daily for ten days, Ambien 10 mg daily for ten days,  Amerge  2.5 mg five days per month, Maxalt 10 mg p.r.n., Lidoderm patch p.r.n.,  Skelaxin 800 mg p.r.n., calcium t.i.d., vitamin D b.i.d., magnesium maleate  t.i.d., manganese daily, thiamin, co-enzyme Q12 and birth control.   FAMILY HISTORY:  Grandparents with colon polyps. No history of inflammatory  bowel disease or colon cancer.   SOCIAL HISTORY:  The patient is married without children. 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 She does  not smoke, rarely uses alcohol. She is accompanied today by her mother.   REVIEW OF SYSTEMS:  Per diagnostic evaluation form.   PHYSICAL EXAMINATION:  Well-appearing female in no acute distress. Blood  pressure: 98/52. Heart rate: 100 and regular. Weight: 116.8 pounds. She is  5'3 in height.  HEENT: Sclera anicteric. Conjunctivae are pink. Oral mucosa intact. No  adenopathy.  LUNGS:  Clear.  HEART: Regular.  ABDOMEN: Soft, without tenderness, mass or hernia. Good bowel sounds heard.  RECTAL: Recent rectal examination with Dr. Artis Flock was negative including  Hemoccult negative stools.  EXTREMITIES: The extremities are without edema.   IMPRESSION:  This is a 35 year old female who is referred regarding  diarrhea, rectal bleeding and weight loss. I suspect that the patient does  have irritable bowel and her rectal bleeding is on the basis of benign anal  rectal pathology. In terms of her weight loss, I suspect this is indeed due  to her dietary change. However, underlying organic pathology such as  inflammatory bowel disease must be excluded. Colorectal neoplasia, though  less likely, should also be excluded. Other considerations for her chronic  bowel problems and recent weight loss include celiac sprue.   RECOMMENDATIONS:  1. Obtain tissue transglutaminase antibody as a screening tool for sprue.  2. Schedule colonoscopy to evaluate rectal bleeding, diarrhea and weight      loss.  3. If the above  studies are unrevealing then treatment for irritable bowel      including fiber and antispasmodics.            ______________________________  Wilhemina Bonito. Eda Keys., MD      JNP/MedQ  DD:  02/19/2006  DT:  02/20/2006  Job #:  [id number]   cc:   Quita Skye. Artis Flock, M.D.

## 2010-09-22 NOTE — Assessment & Plan Note (Signed)
[facility]                           GASTROENTEROLOGY OFFICE NOTE   NAME:Zukowski, SHELVY HECKERT                     MRN:          [id number]  DATE:03/27/2006                            DOB:          [birth year]    HISTORY:  Trudy presents today for followup.  She is a 35 year old who  was evaluated on February 19, 2006, regarding rectal bleeding, diarrhea and  weight loss.  See that dictation for details.  Screening for celiac sprue  was negative.  A complete colonoscopy was performed on March 14, 2006.  The colon was grossly normal.  The ileum was grossly normal.  Random  biopsies of the colon were normal, with no evidence of microscopic colitis.  She was noted to have an incidental 8 mm sigmoid colon polyp, which was  removed and found to be a tubular adenoma.  She returns today for followup.   She has been using Levsin for lower abdominal cramping and loose stools.  This seems to help.  She does mention some epigastric soreness which she  thinks she has had since her procedure and is new.  The discomfort seems to  be exacerbated by position or by direct palpation.  She thinks it may be her  fibromyalgia.   CURRENT MEDICATIONS:  Unchanged from her last visit, except for Levsin.   PHYSICAL EXAMINATION:  GENERAL:  A well-appearing female, in no acute  distress.  VITAL SIGNS:  Blood pressure 110/62, heart rate 88, weight 115 pounds.  ABDOMEN:  Is entirely benign.   IMPRESSION:  1. Chronic lower abdominal complaints, consistent with irritable bowel      syndrome.  A negative workup for organic disorders, as discussed.  2. Incidental colonic adenoma, removed on a recent colonoscopy.   RECOMMENDATIONS:  1. Continue fiber and anti-spasmodics for irritable bowel.  2. Follow up surveilance colonoscopy in five years, due to the presence of      a tubular adenoma.  3. Resume general medical care with Dr. Quita Skye. Kindl.     Wilhemina Bonito. Eda Keys., MD  Electronically Signed    JNP/MedQ  DD: 03/27/2006  DT: 03/27/2006  Job #: [id number]   cc:   Quita Skye. Artis Flock, M.D.

## 2010-10-13 ENCOUNTER — Ambulatory Visit (INDEPENDENT_AMBULATORY_CARE_PROVIDER_SITE_OTHER): Payer: BC Managed Care – PPO | Admitting: Family Medicine

## 2010-10-13 DIAGNOSIS — R350 Frequency of micturition: Secondary | ICD-10-CM

## 2010-10-13 DIAGNOSIS — R300 Dysuria: Secondary | ICD-10-CM

## 2010-10-13 LAB — POCT URINALYSIS DIPSTICK: Bilirubin, UA: NEGATIVE

## 2010-10-13 MED ORDER — NITROFURANTOIN MONOHYD MACRO 100 MG PO CAPS: 100.0000 mg | ORAL_CAPSULE | Freq: Two times a day (BID) | ORAL | Status: DC

## 2010-10-13 NOTE — Progress Notes (Signed)
  Subjective:    Patient ID: Abigail Singh, female    DOB: 1975/08/22, 35 y.o.   MRN: [id number]  HPI ?UTI- hx of recurrent infxns, yesterday developed urinary frequency.  Today developed dysuria, pelvic pressure, hesitancy.  No fevers/chills, back pain.   Review of Systems For ROS see HPI     Objective:   Physical Exam  Constitutional: She appears well-developed and well-nourished. No distress.  Abdominal: Soft. Bowel sounds are normal. She exhibits no distension. There is no tenderness (no CVA or suprapubic).          Assessment & Plan:

## 2010-10-13 NOTE — Assessment & Plan Note (Signed)
Pt's sxs and PE consistent w/ UTI.  Start Macrobid for infxn due to pt's allergies.  Reviewed supportive care and red flags that should prompt return. Pt expressed understanding and is in agreement w/ plan.

## 2010-10-13 NOTE — Patient Instructions (Addendum)
This appears to be a UTI Drink plenty of fluids Take the Macrobid as directed- take w/ food to avoid upset stomach Call with any questions or concerns Have a great weekend!

## 2010-10-16 LAB — URINE CULTURE: Colony Count: >=100000

## 2010-10-20 ENCOUNTER — Encounter: Payer: Self-pay | Admitting: Family Medicine

## 2010-10-23 ENCOUNTER — Encounter: Payer: Self-pay | Admitting: Family Medicine

## 2010-10-23 ENCOUNTER — Ambulatory Visit (INDEPENDENT_AMBULATORY_CARE_PROVIDER_SITE_OTHER): Payer: BC Managed Care – PPO | Admitting: Family Medicine

## 2010-10-23 DIAGNOSIS — Z0000 Encounter for general adult medical examination without abnormal findings: Secondary | ICD-10-CM

## 2010-10-23 DIAGNOSIS — E049 Nontoxic goiter, unspecified: Secondary | ICD-10-CM

## 2010-10-23 LAB — CBC WITH DIFFERENTIAL/PLATELET
Eosinophils Relative: 1.5 % (ref 0.0–5.0)
Hemoglobin: 13.6 g/dL (ref 12.0–15.0)
Lymphocytes Relative: 35.6 % (ref 12.0–46.0)
MCHC: 34.4 g/dL (ref 30.0–36.0)
MCV: 89.6 fl (ref 78.0–100.0)
Monocytes Absolute: 0.5 10*3/uL (ref 0.1–1.0)
Monocytes Relative: 7 % (ref 3.0–12.0)
Neutrophils Relative %: 55.4 % (ref 43.0–77.0)
Platelets: 217 10*3/uL (ref 150.0–400.0)
RBC: 4.41 Mil/uL (ref 3.87–5.11)
RDW: 13.2 % (ref 11.5–14.6)
WBC: 6.7 10*3/uL (ref 4.5–10.5)

## 2010-10-23 LAB — BASIC METABOLIC PANEL
BUN: 15 mg/dL (ref 6–23)
Chloride: 97 mEq/L (ref 96–112)
Creatinine, Ser: 0.7 mg/dL (ref 0.4–1.2)
GFR: 104.79 mL/min (ref >60.00)
Potassium: 3.9 mEq/L (ref 3.5–5.1)

## 2010-10-23 LAB — HEPATIC FUNCTION PANEL
ALT: 12 U/L (ref 0–35)
AST: 18 U/L (ref 0–37)
Albumin: 4.3 g/dL (ref 3.5–5.2)
Alkaline Phosphatase: 70 U/L (ref 39–117)
Bilirubin, Direct: 0.1 mg/dL (ref 0.0–0.3)
Total Bilirubin: 0.8 mg/dL (ref 0.3–1.2)
Total Protein: 7.8 g/dL (ref 6.0–8.3)

## 2010-10-23 NOTE — Progress Notes (Signed)
  Subjective:    Patient ID: Abigail Singh, female    DOB: 1975/10/03, 35 y.o.   MRN: [id number]  HPI CPE- GYN Cousins (UTD on pap).  Thyromegaly- had [place] last summer which indicated normal appearance.  Radiologist didn't feel f/u was needed unless pt felt nodule was enlarging.  She reports 'it will come and go', there are times she 'can actually feel the nodule'.  But 'i haven't felt it in a long time'.  Weight gain- had twins 2.5 yrs ago, lost 45-50 lbs while they were in the NICU but has not 'lost a lb since'.  Weight has stayed consistent and pt is worried b/c of the strong family hx of DM.  Just started exercising 1-2 weeks ago.  Has treadmill at home.  Admits to sugar cravings but doesn't allow junk food in the house.   Review of Systems Patient reports no vision/ hearing changes, adenopathy,fever, weight change,  persistant/recurrent hoarseness , swallowing issues, chest pain, palpitations, edema, persistant/recurrent cough, hemoptysis, dyspnea (rest/exertional/paroxysmal nocturnal), gastrointestinal bleeding (melena, rectal bleeding), abdominal pain, significant heartburn, bowel changes, GU symptoms (dysuria, hematuria, incontinence), Gyn symptoms (abnormal  bleeding, pain),  syncope, focal weakness, memory loss, numbness & tingling, skin/hair/nail changes, abnormal bruising or bleeding, anxiety, or depression.     Objective:   Physical Exam  General Appearance:    Alert, cooperative, no distress, appears stated age  Head:    Normocephalic, without obvious abnormality, atraumatic  Eyes:    PERRL, conjunctiva/corneas clear, EOM's intact, fundi    benign, both eyes  Ears:    Normal TM's and external ear canals, both ears  Nose:   Nares normal, septum midline, mucosa normal, no drainage    or sinus tenderness  Throat:   Lips, mucosa, and tongue normal; teeth and gums normal  Neck:   Supple, symmetrical, trachea midline, no adenopathy;    Thyroid: mild fullness but no discernible nodule   Back:     Symmetric, no curvature, ROM normal, no CVA tenderness  Lungs:     Clear to auscultation bilaterally, respirations unlabored  Chest Wall:    No tenderness or deformity   Heart:    Regular rate and rhythm, S1 and S2 normal, no murmur, rub   or gallop  Breast Exam:    GYN  Abdomen:     Soft, non-tender, bowel sounds active all four quadrants,    no masses, no organomegaly  Genitalia:    GYN  Rectal:    GYN  Extremities:   Extremities normal, atraumatic, no cyanosis or edema  Pulses:   2+ and symmetric all extremities  Skin:   Skin color, texture, turgor normal, no rashes or lesions  Lymph nodes:   Cervical, supraclavicular, and axillary nodes normal  Neurologic:   CNII-XII intact, normal strength, sensation and reflexes    throughout          Assessment & Plan:

## 2010-10-23 NOTE — Patient Instructions (Signed)
Follow up in 1 year or as needed If you again develop neck swelling- call and we can repeat the ultrasound Your exam looks great!  Keep up the good work! Don't beat yourself up about the weight- you'll get there! Call with any questions or concerns Have a great summer!!!

## 2010-10-25 ENCOUNTER — Encounter: Payer: Self-pay | Admitting: *Deleted

## 2010-10-25 LAB — VITAMIN D 1,25 DIHYDROXY
Vitamin D 1, 25 (OH)2 Total: 47 pg/mL (ref 18–72)
Vitamin D3 1, 25 (OH)2: 47 pg/mL

## 2010-10-29 NOTE — Assessment & Plan Note (Signed)
Pt's PE WNL.  Check labs.  Anticipatory guidance provided.  

## 2010-10-29 NOTE — Assessment & Plan Note (Signed)
Discussed recommended f/u.  Pt to call if she again feels nodule is enlarged.

## 2010-11-21 ENCOUNTER — Other Ambulatory Visit: Payer: Self-pay | Admitting: Family Medicine

## 2010-11-21 MED ORDER — MUPIROCIN 2 % EX OINT: TOPICAL_OINTMENT | Freq: Three times a day (TID) | CUTANEOUS | Status: AC

## 2010-11-21 NOTE — Telephone Encounter (Signed)
Per Dr Laury Axon

## 2010-11-21 NOTE — Telephone Encounter (Signed)
Pt.notified

## 2010-11-21 NOTE — Telephone Encounter (Addendum)
Ok to send or should pt be seen at urgent care there? FYI-- I cannot find this rx in computer, must have been given elsewhere. Please advise.

## 2010-11-21 NOTE — Telephone Encounter (Signed)
bactroban apply tid for 7 days----15 g If it does not respond to tx after few days she should go to UC there

## 2010-11-21 NOTE — Telephone Encounter (Signed)
Left message on voicemail to call the office

## 2010-11-21 NOTE — Telephone Encounter (Signed)
Patient wants rx called in for mupricion cream - patient is out of town - she has blisters on her leg  & feels it may be Laveda Norman again - [facility] - [place] [phone]

## 2010-12-25 ENCOUNTER — Encounter: Payer: Self-pay | Admitting: Family Medicine

## 2010-12-25 ENCOUNTER — Ambulatory Visit (INDEPENDENT_AMBULATORY_CARE_PROVIDER_SITE_OTHER): Payer: BC Managed Care – PPO | Admitting: Family Medicine

## 2010-12-25 VITALS — BP 100/60 | Temp 98.6°F | Wt 161.0 lb

## 2010-12-25 DIAGNOSIS — L02419 Cutaneous abscess of limb, unspecified: Secondary | ICD-10-CM | POA: Insufficient documentation

## 2010-12-25 DIAGNOSIS — IMO0002 Reserved for concepts with insufficient information to code with codable children: Secondary | ICD-10-CM

## 2010-12-25 MED ORDER — DOXYCYCLINE HYCLATE 100 MG PO TABS: 100.0000 mg | ORAL_TABLET | Freq: Two times a day (BID) | ORAL | Status: DC

## 2010-12-25 NOTE — Progress Notes (Signed)
  Subjective:    Patient ID: Abigail Singh, female    DOB: [birth year], 35 y.o.   MRN: [id number]  HPI ? 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  Now having painful lump under L armpit.  First noticed 1 week ago, thought it was a swollen LN.  Has continually enlarged.  Has been applying mupirocin tid x1 week.   Review of Systems For ROS see HPI     Objective:   Physical Exam  Vitals reviewed. Constitutional: She appears well-developed and well-nourished. No distress.  Skin: Skin is warm and dry. There is erythema (2.5 cm x 1 cm area of redness and induration in L axilla w/ overlying erythema, very TTP).          Assessment & Plan:

## 2010-12-25 NOTE — Assessment & Plan Note (Signed)
Pt's abscess w/out fluctuance- nothing to drain today.  Given recent MRSA will tx as such.  Start doxy.  Reviewed supportive care and red flags that should prompt return.  Pt expressed understanding and is in agreement w/ plan.

## 2010-12-25 NOTE — Patient Instructions (Signed)
Take the doxy as directed- take w/ food to avoid upset stomach Apply hot compresses to facilitate drainage If symptoms change or worsen- call me! Hang in there!

## 2011-01-02 ENCOUNTER — Telehealth: Payer: Self-pay | Admitting: *Deleted

## 2011-01-02 NOTE — Telephone Encounter (Signed)
Pt called left msg that she will be finishing medication on tomorrow. Says area still isn't much improved and seems to be spreading. A would like to know what she should do. Pls advise was told to call if no improvement.  Tried calling pt back left on voicemail to return call.

## 2011-01-03 ENCOUNTER — Encounter: Payer: Self-pay | Admitting: Family Medicine

## 2011-01-03 ENCOUNTER — Telehealth: Payer: Self-pay

## 2011-01-03 ENCOUNTER — Ambulatory Visit (INDEPENDENT_AMBULATORY_CARE_PROVIDER_SITE_OTHER): Payer: BC Managed Care – PPO | Admitting: Family Medicine

## 2011-01-03 DIAGNOSIS — IMO0002 Reserved for concepts with insufficient information to code with codable children: Secondary | ICD-10-CM

## 2011-01-03 DIAGNOSIS — L02419 Cutaneous abscess of limb, unspecified: Secondary | ICD-10-CM

## 2011-01-03 MED ORDER — DOXYCYCLINE HYCLATE 100 MG PO TABS: 100.0000 mg | ORAL_TABLET | Freq: Two times a day (BID) | ORAL | Status: AC

## 2011-01-03 NOTE — Telephone Encounter (Signed)
Would need f/u appt to re-assess.  May need to be drained.

## 2011-01-03 NOTE — Telephone Encounter (Signed)
Appointment scheduled.

## 2011-01-03 NOTE — Assessment & Plan Note (Signed)
Abscess has drained.  Central pore remains open- no current drainage.  Given surrounding erythema will extend course of Doxy for 7 more days.  Reviewed supportive care and red flags that should prompt return.  Pt expressed understanding and is in agreement w/ plan.

## 2011-01-03 NOTE — Telephone Encounter (Signed)
Opened in error

## 2011-01-03 NOTE — Patient Instructions (Signed)
Follow up as needed Continue the Doxy for 7 more days- take w/ food Keep area clean and dry- does not have to be covered Call with any questions or concerns Happy Labor Day!

## 2011-01-03 NOTE — Progress Notes (Signed)
  Subjective:    Patient ID: Abigail Singh, female    DOB: 09-25-1975, 35 y.o.   MRN: [id number]  HPI Abscess- under L arm, drained Friday night while sleeping.  'it was huge'.  Drainage was foul smelling.  Has been keeping area covered- now getting blisters from the tape.  Due to finish doxy tomorrow- wants it looked at to make sure it's improving.   Review of Systems For ROS see HPI     Objective:   Physical Exam  Vitals reviewed. Constitutional: She appears well-developed and well-nourished. No distress.  Skin: Skin is warm and dry. There is erythema (3 cm area of erythema in L axilla w/ central pore- no drainage, no fluctuance, minimal induration.).          Assessment & Plan:

## 2011-01-12 ENCOUNTER — Encounter: Payer: Self-pay | Admitting: Family Medicine

## 2011-01-12 ENCOUNTER — Ambulatory Visit (INDEPENDENT_AMBULATORY_CARE_PROVIDER_SITE_OTHER): Payer: BC Managed Care – PPO | Admitting: Family Medicine

## 2011-01-12 DIAGNOSIS — IMO0002 Reserved for concepts with insufficient information to code with codable children: Secondary | ICD-10-CM

## 2011-01-12 DIAGNOSIS — L02419 Cutaneous abscess of limb, unspecified: Secondary | ICD-10-CM

## 2011-01-14 NOTE — Progress Notes (Signed)
  Subjective:    Patient ID: Abigail Singh, female    DOB: February 14, 1976, 35 y.o.   MRN: [id number]  HPI Abscess- pt reports she is no longer having pain or drainage but the area remains 'reddish-purple' and she isn't sure whether she needs additional abx.  On last day of Doxy.   Review of Systems For ROS see HPI     Objective:   Physical Exam  Vitals reviewed. Constitutional: She appears well-developed and well-nourished. No distress.  Skin: Skin is warm and dry. No erythema.       L axillary abscess resolved w/ residual hyperpigmentation but no erythema, induration, or TTP          Assessment & Plan:

## 2011-01-14 NOTE — Assessment & Plan Note (Signed)
Has resolved.  Discussed that hyperpigmentation will fade slowly over time but may remain to some degree as a scar.  Pt expressed understanding.

## 2011-01-26 LAB — CBC
HCT: 38.2
RBC: 4.22
RDW: 13.6

## 2011-02-09 LAB — CBC
Hemoglobin: 10.6 g/dL — ABNORMAL LOW (ref 12.0–15.0)
MCHC: 34.3 g/dL (ref 30.0–36.0)
MCV: 91.4 fL (ref 78.0–100.0)
RBC: 3.34 MIL/uL — ABNORMAL LOW (ref 3.87–5.11)
RBC: 3.81 MIL/uL — ABNORMAL LOW (ref 3.87–5.11)
WBC: 12.7 10*3/uL — ABNORMAL HIGH (ref 4.0–10.5)

## 2011-02-09 LAB — URINALYSIS, ROUTINE W REFLEX MICROSCOPIC
Bilirubin Urine: NEGATIVE
Ketones, ur: NEGATIVE mg/dL
Nitrite: NEGATIVE
pH: 7 (ref 5.0–8.0)

## 2011-02-09 LAB — URINE CULTURE: Colony Count: NO GROWTH

## 2011-02-09 LAB — CULTURE, BETA STREP (GROUP B ONLY)

## 2011-02-09 LAB — RPR: RPR Ser Ql: NONREACTIVE

## 2011-02-09 LAB — URINE MICROSCOPIC-ADD ON

## 2011-03-09 ENCOUNTER — Ambulatory Visit (INDEPENDENT_AMBULATORY_CARE_PROVIDER_SITE_OTHER): Payer: BC Managed Care – PPO | Admitting: Family Medicine

## 2011-03-09 VITALS — BP 118/65 | HR 90 | Temp 98.6°F | Ht 64.0 in | Wt 160.8 lb

## 2011-03-09 DIAGNOSIS — J019 Acute sinusitis, unspecified: Secondary | ICD-10-CM

## 2011-03-09 MED ORDER — AZITHROMYCIN 250 MG PO TABS: 250.0000 mg | ORAL_TABLET | Freq: Every day | ORAL | Status: AC

## 2011-03-09 MED ORDER — BENZONATATE 200 MG PO CAPS: 200.0000 mg | ORAL_CAPSULE | Freq: Three times a day (TID) | ORAL | Status: AC | PRN

## 2011-03-09 NOTE — Assessment & Plan Note (Signed)
+   sinus tenderness on exam, 4 weeks of sxs.  Will start abx.  Pt reports Zpack worked well for her previously.  Reviewed supportive care and red flags that should prompt return.  Pt expressed understanding and is in agreement w/ plan.

## 2011-03-09 NOTE — Progress Notes (Signed)
  Subjective:    Patient ID: Abigail Singh, female    DOB: [birth year], 35 y.o.   MRN: [id number]  HPI Sore throat- sxs started yesterday.  Pain w/ swallowing.  Alternating chills, sweats.  Took nyquil last night.  Going out of town [address].  Cough x4 weeks w/ laryngitis.  + sick contacts.  R ear pain.  No nasal congestion, facial pain.  Sore throat improved w/ hot chocolate and Sucrets   Review of Systems For ROS see HPI     Objective:   Physical Exam  Vitals reviewed. Constitutional: She appears well-developed and well-nourished. No distress.  HENT:  Head: Normocephalic and atraumatic.  Right Ear: Tympanic membrane normal.  Left Ear: Tympanic membrane normal.  Nose: Mucosal edema and rhinorrhea present. Right sinus exhibits maxillary sinus tenderness and frontal sinus tenderness. Left sinus exhibits maxillary sinus tenderness and frontal sinus tenderness.  Mouth/Throat: Uvula is midline and mucous membranes are normal. Posterior oropharyngeal erythema present. No oropharyngeal exudate.  Eyes: Conjunctivae and EOM are normal. Pupils are equal, round, and reactive to light.  Neck: Normal range of motion. Neck supple.  Cardiovascular: Normal rate, regular rhythm and normal heart sounds.   Pulmonary/Chest: Effort normal and breath sounds normal. No respiratory distress. She has no wheezes.  Lymphadenopathy:    She has no cervical adenopathy.          Assessment & Plan:

## 2011-03-09 NOTE — Patient Instructions (Signed)
Start the Zpack for your sinus infection The strep test was negative- the pain is likely due to the nasal drainage Add Mucinex to thin your congestion Take the Tessalon as needed for cough Drink plenty of fluids REST! Hang in there!

## 2011-03-12 ENCOUNTER — Encounter: Payer: Self-pay | Admitting: Internal Medicine

## 2011-04-05 ENCOUNTER — Encounter: Payer: Self-pay | Admitting: Family Medicine

## 2011-04-05 ENCOUNTER — Ambulatory Visit (INDEPENDENT_AMBULATORY_CARE_PROVIDER_SITE_OTHER): Payer: BC Managed Care – PPO | Admitting: Family Medicine

## 2011-04-05 VITALS — BP 105/70 | HR 68 | Temp 97.9°F | Ht 64.0 in | Wt 160.0 lb

## 2011-04-05 DIAGNOSIS — H6690 Otitis media, unspecified, unspecified ear: Secondary | ICD-10-CM

## 2011-04-05 MED ORDER — CLARITHROMYCIN ER 500 MG PO TB24: 1000.0000 mg | ORAL_TABLET | Freq: Every day | ORAL | Status: AC

## 2011-04-05 NOTE — Patient Instructions (Signed)
Your R ear is infected Take the Biaxin as directed- take both tabs at the same time and take w/ food Add Mucinex to thin your congestion Drink plenty of fluids Continue the tylenol and ibuprofen REST! Hang in there! Happy Holidays!

## 2011-04-05 NOTE — Assessment & Plan Note (Signed)
Pt w/ combo of OM and sinusitis.  Start abx.  Reviewed supportive care and red flags that should prompt return.  Pt expressed understanding and is in agreement w/ plan.

## 2011-04-05 NOTE — Progress Notes (Signed)
  Subjective:    Patient ID: Abigail Singh, female    DOB: [birth year], 35 y.o.   MRN: [id number]  HPI Ear pain- R>L, sxs started a few days ago, no fevers.  + sore throat, nasal congestion, cough- productive of phlegm.  + sick contacts.  + facial pain.  No tooth pain.   Review of Systems For ROS see HPI     Objective:   Physical Exam  Constitutional: She appears well-developed and well-nourished. No distress.  HENT:  Head: Normocephalic and atraumatic.  Right Ear: Tympanic membrane is injected and erythematous. A middle ear effusion is present.  Left Ear: Tympanic membrane normal.  Nose: Mucosal edema and rhinorrhea present. Right sinus exhibits maxillary sinus tenderness and frontal sinus tenderness. Left sinus exhibits maxillary sinus tenderness and frontal sinus tenderness.  Mouth/Throat: Uvula is midline and mucous membranes are normal. Posterior oropharyngeal erythema present. No oropharyngeal exudate.  Eyes: Conjunctivae and EOM are normal. Pupils are equal, round, and reactive to light.  Neck: Normal range of motion. Neck supple.  Cardiovascular: Normal rate, regular rhythm and normal heart sounds.   Pulmonary/Chest: Effort normal and breath sounds normal. No respiratory distress. She has no wheezes.  Lymphadenopathy:    She has no cervical adenopathy.          Assessment & Plan:

## 2011-04-12 ENCOUNTER — Telehealth: Payer: Self-pay | Admitting: Internal Medicine

## 2011-04-12 NOTE — Telephone Encounter (Signed)
Spoke with pt about her colon appt. Pt will call back this afternoon to decide the date for the procedure.

## 2011-04-12 NOTE — Telephone Encounter (Signed)
Pts colon moved to 05/07/11@11 :30am due to needing a propofol slot. Pt aware of appt date and time.

## 2011-04-13 ENCOUNTER — Encounter: Payer: Self-pay | Admitting: Internal Medicine

## 2011-04-13 ENCOUNTER — Ambulatory Visit (AMBULATORY_SURGERY_CENTER): Payer: BC Managed Care – PPO

## 2011-04-13 VITALS — Ht 63.5 in | Wt 161.7 lb

## 2011-04-13 DIAGNOSIS — Z8371 Family history of colonic polyps: Secondary | ICD-10-CM

## 2011-04-13 DIAGNOSIS — Z800 Family history of malignant neoplasm of digestive organs: Secondary | ICD-10-CM

## 2011-04-13 DIAGNOSIS — Z86010 Personal history of colonic polyps: Secondary | ICD-10-CM

## 2011-04-13 MED ORDER — PEG-KCL-NACL-NASULF-NA ASC-C 100 G PO SOLR: 1.0000 | Freq: Once | ORAL | Status: AC

## 2011-04-25 ENCOUNTER — Encounter: Payer: BC Managed Care – PPO | Admitting: Internal Medicine

## 2011-04-27 ENCOUNTER — Other Ambulatory Visit: Payer: BC Managed Care – PPO | Admitting: Internal Medicine

## 2011-05-07 ENCOUNTER — Encounter: Payer: Self-pay | Admitting: Internal Medicine

## 2011-05-07 ENCOUNTER — Ambulatory Visit (AMBULATORY_SURGERY_CENTER): Payer: BC Managed Care – PPO | Admitting: Internal Medicine

## 2011-05-07 VITALS — BP 114/71 | HR 71 | Temp 99.1°F | Resp 19 | Ht 63.5 in | Wt 161.0 lb

## 2011-05-07 DIAGNOSIS — Z1211 Encounter for screening for malignant neoplasm of colon: Secondary | ICD-10-CM

## 2011-05-07 DIAGNOSIS — Z800 Family history of malignant neoplasm of digestive organs: Secondary | ICD-10-CM

## 2011-05-07 DIAGNOSIS — R933 Abnormal findings on diagnostic imaging of other parts of digestive tract: Secondary | ICD-10-CM

## 2011-05-07 DIAGNOSIS — D126 Benign neoplasm of colon, unspecified: Secondary | ICD-10-CM

## 2011-05-07 DIAGNOSIS — Z86010 Personal history of colonic polyps: Secondary | ICD-10-CM

## 2011-05-07 DIAGNOSIS — Z8371 Family history of colonic polyps: Secondary | ICD-10-CM

## 2011-05-07 MED ORDER — SODIUM CHLORIDE 0.9 % IV SOLN: 500.0000 mL | INTRAVENOUS | Status: DC

## 2011-05-07 NOTE — Patient Instructions (Signed)
Follow the instructions on the blue and green instruction sheets.  Continue your medications.  Await pathology results.

## 2011-05-07 NOTE — Op Note (Signed)
[facility] [address] [place], [place]  [address]  COLONOSCOPY PROCEDURE REPORT  PATIENT:  Abigail, Singh  MR#:  [id number] BIRTHDATE:  24-Mar-1976, 35 yrs. old  GENDER:  female ENDOSCOPIST:  Wilhemina Bonito. Eda Keys, MD REF. BY:  Surveillance Program Recall, PROCEDURE DATE:  05/07/2011 PROCEDURE:  Colonoscopy with snare polypectomy x 1, Colonoscopy with biopsies, Colonoscopy with submucosal injection of Spot Ink ASA CLASS:  Class II INDICATIONS:  history of pre-cancerous (adenomatous) colon polyps, surveillance and high-risk screening, family history of colon cancer ; index 03-2006 w/ TA MEDICATIONS:   MAC sedation, administered by CRNA, propofol (Diprivan) 200 mg IV  DESCRIPTION OF PROCEDURE:   After the risks benefits and alternatives of the procedure were thoroughly explained, informed consent was obtained.  Digital rectal exam was performed and revealed no abnormalities.   The Pentax Ped Colon [id number] and LB [id number] endoscope was introduced through the anus and advanced to the cecum, which was identified by both the appendix and ileocecal valve, without limitations.  The quality of the prep was excellent, using MoviPrep.  The instrument was then slowly withdrawn as the colon was fully examined. <<PROCEDUREIMAGES>>  FINDINGS:  A diminutive polyp was found in the sigmoid colon and snared without cautery. Retrieval was successful. Nonspecific irregular appearing mucosal area (1.5cm) in the distal transverse colon at 60cm.This was bx then marked w/ Spot ink tattoo. Retroflexed views in the rectum revealed no abnormalities.  The time to cecum = 3:06  minutes. The scope was then withdrawn in 13:05  minutes from the cecum and the procedure completed.  COMPLICATIONS:  None  ENDOSCOPIC IMPRESSION: 1) Diminutive polyp in the sigmoid colon - removed 2) Abnormal mucosa in the distal transverse colon - biopsied and marked  RECOMMENDATIONS: 1) Await biopsy  results 2) Follow up colonoscopy in 5 years - IF BX NEGATIVE FOR NEOPLASIA  ______________________________ Wilhemina Bonito. Eda Keys, MD  CC:  Neena Rhymes MD; The Patient  n. eSIGNED:   Wilhemina Bonito. Eda Keys at 05/07/2011 12:36 PM  Wayna Chalet, [phone]

## 2011-05-07 NOTE — Progress Notes (Signed)
Patient did not experience any of the following events: a burn prior to discharge; a fall within the facility; wrong site/side/patient/procedure/implant event; or a hospital transfer or hospital admission upon discharge from the facility. (G8907) Patient did not have preoperative order for IV antibiotic SSI prophylaxis. (G8918)  

## 2011-05-09 ENCOUNTER — Telehealth: Payer: Self-pay

## 2011-05-09 NOTE — Telephone Encounter (Signed)
I left a message on her answering machine to please call if any questions or concerns. maw

## 2011-05-16 ENCOUNTER — Telehealth: Payer: Self-pay | Admitting: Internal Medicine

## 2011-05-16 NOTE — Telephone Encounter (Signed)
lmom for pt to call back. Pt with COLON on 05/06/12 with several polyps sent to path. Dr Marina Goodell, I don't think you have signed off on the report yet; OK to tell the pt the report is benign and next COLON is in 5 years? Thanks.

## 2011-05-16 NOTE — Telephone Encounter (Signed)
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 Informed pt her path showed a polyp that was an adenoma meaning it has the potential to become cancerous but it is not! The other polyp was benign. Dr Marina Goodell will send her a letter stating when she should have her next Colonoscopy. Pt stated understanding.

## 2011-05-17 ENCOUNTER — Encounter: Payer: Self-pay | Admitting: Internal Medicine

## 2011-05-17 ENCOUNTER — Telehealth: Payer: Self-pay | Admitting: *Deleted

## 2011-05-17 NOTE — Telephone Encounter (Signed)
I discussed the findings with her by telephone this afternoon. She will need a repeat colonoscopy to have the serrated adenoma removed. She will schedule the examination this summer when she is out of school. I will send her a letter as well.

## 2011-05-17 NOTE — Telephone Encounter (Signed)
Pt stated there was so much noise yesterday, she didn't understand what I told her about her path. We over the path again as stated in yesterday's note; pt stated understanding.

## 2011-08-13 LAB — OB RESULTS CONSOLE ABO/RH: RH Type: POSITIVE

## 2011-08-23 ENCOUNTER — Other Ambulatory Visit: Payer: Self-pay

## 2011-08-23 ENCOUNTER — Telehealth: Payer: Self-pay | Admitting: Internal Medicine

## 2011-08-23 NOTE — Telephone Encounter (Signed)
Left message for pt to call back  °

## 2011-08-24 NOTE — Telephone Encounter (Signed)
After would be fine. Have her call us. Congratulate her for me

## 2011-08-24 NOTE — Telephone Encounter (Signed)
Pt aware and she will call us back to schedule after delivery.

## 2011-08-24 NOTE — Telephone Encounter (Signed)
Pt was supposed to have a colon in June to follow-up on precancerous lesion that was found during colon in December. Pt called to let us know she is 9 1/[redacted] weeks pregnant. She is due in November. She wants to know what should be done. She asked if the colon could be done after the baby is born while she is breast feeding. Dr. Marina Goodell please advise.

## 2011-08-31 ENCOUNTER — Encounter: Payer: Self-pay | Admitting: Internal Medicine

## 2011-09-14 ENCOUNTER — Inpatient Hospital Stay (HOSPITAL_COMMUNITY)
Admission: AD | Admit: 2011-09-14 | Discharge: 2011-09-14 | Disposition: A | Discharge status: Home / Self Care | Payer: BC Managed Care – PPO | Source: Ambulatory Visit | Attending: Obstetrics and Gynecology | Admitting: Obstetrics and Gynecology

## 2011-09-14 ENCOUNTER — Encounter (HOSPITAL_COMMUNITY): Payer: Self-pay | Admitting: *Deleted

## 2011-09-14 DIAGNOSIS — O210 Mild hyperemesis gravidarum: Secondary | ICD-10-CM

## 2011-09-14 HISTORY — DX: Anemia, unspecified: D64.9

## 2011-09-14 HISTORY — DX: Benign neoplasm of connective and other soft tissue, unspecified: D21.9

## 2011-09-14 HISTORY — DX: Urinary tract infection, site not specified: N39.0

## 2011-09-14 LAB — URINE MICROSCOPIC-ADD ON

## 2011-09-14 LAB — URINALYSIS, ROUTINE W REFLEX MICROSCOPIC
Glucose, UA: NEGATIVE mg/dL
Protein, ur: NEGATIVE mg/dL
Specific Gravity, Urine: 1.01 (ref 1.005–1.030)
pH: 7 (ref 5.0–8.0)

## 2011-09-14 MED ORDER — FAMOTIDINE IN NACL 20-0.9 MG/50ML-% IV SOLN
20.0000 mg | Freq: Once | INTRAVENOUS | Status: AC
  Administered 2011-09-14: 20 mg via INTRAVENOUS
  Filled 2011-09-14: qty 50

## 2011-09-14 MED ORDER — ONDANSETRON 8 MG/NS 50 ML IVPB
8.0000 mg | Freq: Once | INTRAVENOUS | Status: AC
  Administered 2011-09-14: 8 mg via INTRAVENOUS
  Filled 2011-09-14: qty 8

## 2011-09-14 MED ORDER — DEXTROSE 5 % IN LACTATED RINGERS IV BOLUS
1000.0000 mL | Freq: Once | INTRAVENOUS | Status: AC
  Administered 2011-09-14: 1000 mL via INTRAVENOUS

## 2011-09-14 MED ORDER — RANITIDINE HCL 150 MG PO CAPS: 150.0000 mg | ORAL_CAPSULE | Freq: Two times a day (BID) | ORAL | Status: DC

## 2011-09-14 MED ORDER — ONDANSETRON HCL 8 MG PO TABS: 8.0000 mg | ORAL_TABLET | Freq: Two times a day (BID) | ORAL | Status: AC | PRN

## 2011-09-14 NOTE — MAU Note (Signed)
Ongoing nausea, persistant vomiting since 9wks. Throwing up every 1-2hrs, stimulated with any movement. Not on any meds.   Benadryl works great at night.  But unable during the day.  dizzy off and on, seeing spots this morning.

## 2011-09-14 NOTE — MAU Provider Note (Signed)
History    Cc: N/v x 3 wk (+) h/a daily. Denies sinus sx. Hx migraine. Had spot befor eyes today  Chief Complaint  Patient presents with  . Emesis During Pregnancy     OB History    Grav Para Term Preterm Abortions TAB SAB Ect Mult Living   4 1 0 1 2 0 2 0 1 2       Past Medical History  Diagnosis Date  . Fibromyalgia   . Migraines   . History of fibromyalgia   . Preterm labor   . Anemia     as teenager  . Urinary tract infection   . Mental disorder     PTSD  . Fibroid     Past Surgical History  Procedure Date  . Cesarean section   . Dilation and curettage of uterus   . Wisdom tooth extraction   . Polypectomy   . Colonoscopy   . Tubes in ears     Family History  Problem Relation Age of Onset  . Heart failure      Grandfather  . Heart attack      x's 4 Grandfather  . Stroke      x's 1 Grandfather  . Diabetes    . Diabetes Mother   . Diabetes Father   . Diabetes Maternal Aunt   . Diabetes Maternal Grandmother   . Diabetes Maternal Grandfather   . Anesthesia problems Neg Hx     History  Substance Use Topics  . Smoking status: Former Smoker    Types: Cigarettes    Quit date: 05/07/1997  . Smokeless tobacco: Never Used  . Alcohol Use: No    Allergies:  Allergies  Allergen Reactions  . Aspirin Anaphylaxis  . Codeine Other (See Comments)    halucinations  . Erythromycin Rash  . Penicillins Rash  . Sulfonamide Derivatives Rash    Childhood reaction, has taken recently with only stomach irritation.    Prescriptions prior to admission  Medication Sig Dispense Refill  . diphenhydrAMINE (BENADRYL) 25 mg capsule Take 25 mg by mouth at bedtime.         Physical Exam   Blood pressure 119/76, pulse 93, temperature 97.6 F (36.4 C), temperature source Oral, resp. rate 20, height 5' 3.5" (1.613 m), weight 78.472 kg (173 lb), last menstrual period 06/17/2011. U/a neg No exam performed today, not indicated for complaint. (+) FHR 159. ED Course    HEG IUP @ 12 5/7 wk P ) IVF bolus. To try zofran and to start Zantac 150 mg  Po bid  Keep sched OB appt. freq small meals d/c home after IV Zofran and Pepcid MDM  Chantille Navarrete A, MD 6:41 PM 09/14/2011

## 2011-09-14 NOTE — Discharge Instructions (Signed)
Hyperemesis Gravidarum Diet Hyperemesis gravidarum is a severe form of morning sickness. It is characterized by frequent and severe vomiting. It happens during the first trimester of pregnancy. It may be caused by the rapid hormone changes that happen during pregnancy. It is associated with a 5% weight loss of pre-pregnancy weight. The hyperemesis diet may be used to lessen symptoms of nausea and vomiting. EATING GUIDELINES  Eat 5 to 6 small meals daily instead of 3 large meals.   Avoid foods with strong smells.   Avoid drinking 30 minutes before and after meals.   Avoid fried or high-fat foods, such as butter and cream sauces.   Starchy foods are usually well-tolerated, such as cereal, toast, bread, potatoes, pasta, rice, and pretzels.   Eat crackers before you get out of bed in the morning.   Avoid spicy foods.   Ginger may help with nausea. Add  tsp ginger to hot tea or choose ginger tea.   Continue to take your prenatal vitamins as directed by your caregiver.  SAMPLE MEAL PLAN Breakfast    cup oatmeal   1 slice toast   1 tsp heart-healthy margarine   1 tsp jelly   1 scrambled egg  Midmorning Snack   1 cup low-fat yogurt  Lunch   Plain ham sandwich   Carrot or celery sticks   1 small apple   3 graham crackers  Midafternoon Snack   Cheese and crackers  Dinner  4 oz pork tenderloin   1 small baked potato   1 tsp margarine    cup broccoli    cup grapes  Evening Snack  1 cup pudding  Document Released: 02/18/2007 Document Revised: 04/12/2011 Document Reviewed: 08/18/2010 [employer] Patient Information 2012 [employer]. 

## 2011-09-14 NOTE — MAU Note (Signed)
Headache improved.  Eyes and stomach no longer hurting.

## 2011-09-14 NOTE — MAU Note (Signed)
Feeling better.  Was up to the bathroom, did not throw up, though was reports retching. IV slowed.

## 2011-10-16 ENCOUNTER — Other Ambulatory Visit (HOSPITAL_COMMUNITY): Payer: Self-pay | Admitting: Obstetrics and Gynecology

## 2011-10-16 DIAGNOSIS — Z3689 Encounter for other specified antenatal screening: Secondary | ICD-10-CM

## 2011-10-16 DIAGNOSIS — R772 Abnormality of alphafetoprotein: Secondary | ICD-10-CM

## 2011-10-19 ENCOUNTER — Ambulatory Visit (HOSPITAL_COMMUNITY)
Admission: RE | Admit: 2011-10-19 | Discharge: 2011-10-19 | Disposition: A | Discharge status: Home / Self Care | Payer: BC Managed Care – PPO | Source: Ambulatory Visit | Attending: Obstetrics and Gynecology | Admitting: Obstetrics and Gynecology

## 2011-10-19 ENCOUNTER — Encounter (HOSPITAL_COMMUNITY): Payer: Self-pay

## 2011-10-19 DIAGNOSIS — Z8751 Personal history of pre-term labor: Secondary | ICD-10-CM | POA: Insufficient documentation

## 2011-10-19 DIAGNOSIS — Z363 Encounter for antenatal screening for malformations: Secondary | ICD-10-CM | POA: Insufficient documentation

## 2011-10-19 DIAGNOSIS — R772 Abnormality of alphafetoprotein: Secondary | ICD-10-CM

## 2011-10-19 DIAGNOSIS — O358XX0 Maternal care for other (suspected) fetal abnormality and damage, not applicable or unspecified: Secondary | ICD-10-CM | POA: Insufficient documentation

## 2011-10-19 DIAGNOSIS — Z1389 Encounter for screening for other disorder: Secondary | ICD-10-CM | POA: Insufficient documentation

## 2011-10-19 DIAGNOSIS — Z3689 Encounter for other specified antenatal screening: Secondary | ICD-10-CM

## 2011-10-19 DIAGNOSIS — O34219 Maternal care for unspecified type scar from previous cesarean delivery: Secondary | ICD-10-CM | POA: Insufficient documentation

## 2011-10-19 NOTE — Progress Notes (Signed)
Patient seen today for  Ultrasound appointment.  See full report in [facility].  Alpha Gula, MD  Patient is referred due to 1:72 risk for Down syndrome by Quad screen.  NIPT (Harmony test ) was drawn and is currently prending.  Single IUP at 17 5/7 weeks Normal anatomic fetal survey No markers associated with Down syndrome noted Normal amniotic fluid volume  Recommend follow up ultrasound as clinically indicated.  If Harmony test returns positive for Down syndrome, recommend follow up for genetic counseling and further evaluation.

## 2011-10-22 ENCOUNTER — Other Ambulatory Visit: Payer: Self-pay | Admitting: Obstetrics and Gynecology

## 2012-01-09 ENCOUNTER — Encounter: Payer: Self-pay | Admitting: *Deleted

## 2012-01-09 ENCOUNTER — Encounter: Payer: BC Managed Care – PPO | Attending: Obstetrics and Gynecology | Admitting: *Deleted

## 2012-01-09 DIAGNOSIS — O99810 Abnormal glucose complicating pregnancy: Secondary | ICD-10-CM | POA: Insufficient documentation

## 2012-01-09 DIAGNOSIS — Z713 Dietary counseling and surveillance: Secondary | ICD-10-CM | POA: Insufficient documentation

## 2012-01-09 NOTE — Patient Instructions (Signed)
Goals:  Check glucose levels per MD as instructed  Follow Gestational Diabetes Diet as instructed  Call for follow-up as needed    

## 2012-01-09 NOTE — Progress Notes (Signed)
  Patient was seen on 01/09/2012 for Gestational Diabetes self-management class at the [facility]. The following learning objectives were met by the patient during this course:   States the definition of Gestational Diabetes  States why dietary management is important in controlling blood glucose  Describes the effects each nutrient has on blood glucose levels  Demonstrates ability to create a balanced meal plan  Demonstrates carbohydrate counting   States when to check blood glucose levels  Demonstrates proper blood glucose monitoring techniques  States the effect of stress and exercise on blood glucose levels  States the importance of limiting caffeine and abstaining from alcohol and smoking  Blood glucose monitor given:  One Touch Ultra Mini Self Monitoring Kit Lot # [id number] X Exp: 08/2012 Blood glucose reading: 74 mg/dl  Patient instructed to monitor glucose levels: FBS: 60 - <90 2 hour: <120  *Patient received handouts:  Nutrition Diabetes and Pregnancy  Carbohydrate Counting List  Patient will be seen for follow-up as needed.

## 2012-01-16 ENCOUNTER — Ambulatory Visit: Payer: BC Managed Care – PPO

## 2012-03-06 ENCOUNTER — Encounter (HOSPITAL_COMMUNITY): Payer: Self-pay | Admitting: Pharmacist

## 2012-03-11 ENCOUNTER — Encounter (HOSPITAL_COMMUNITY)
Admission: RE | Admit: 2012-03-11 | Discharge: 2012-03-11 | Disposition: A | Discharge status: Home / Self Care | Payer: BC Managed Care – PPO | Source: Ambulatory Visit | Attending: Obstetrics and Gynecology | Admitting: Obstetrics and Gynecology

## 2012-03-11 ENCOUNTER — Encounter (HOSPITAL_COMMUNITY): Payer: Self-pay

## 2012-03-11 HISTORY — DX: Other specified pregnancy related conditions, unspecified trimester: O26.899

## 2012-03-11 HISTORY — DX: Heartburn: R12

## 2012-03-11 LAB — BASIC METABOLIC PANEL
CO2: 24 mEq/L (ref 19–32)
Calcium: 9 mg/dL (ref 8.4–10.5)
Creatinine, Ser: 0.6 mg/dL (ref 0.50–1.10)
GFR calc non Af Amer: >90 mL/min (ref >90)
Glucose, Bld: 87 mg/dL (ref 70–99)

## 2012-03-11 LAB — CBC
Hemoglobin: 12.2 g/dL (ref 12.0–15.0)
MCH: 27.5 pg (ref 26.0–34.0)
MCHC: 32.7 g/dL (ref 30.0–36.0)
RDW: 16.4 % — ABNORMAL HIGH (ref 11.5–15.5)

## 2012-03-11 LAB — ABO/RH: ABO/RH(D): A POS

## 2012-03-11 LAB — TYPE AND SCREEN
ABO/RH(D): A POS
Antibody Screen: NEGATIVE

## 2012-03-11 LAB — SURGICAL PCR SCREEN
MRSA, PCR: POSITIVE — AB
Staphylococcus aureus: POSITIVE — AB

## 2012-03-11 LAB — RPR: RPR Ser Ql: NONREACTIVE

## 2012-03-11 NOTE — Patient Instructions (Signed)
Your procedure is scheduled on:03/17/12  Enter through the Main Entrance at :6am Pick up desk phone and dial [phone] and inform us of your arrival.  Please call [phone] if you have any problems the morning of surgery.  Remember: Do not eat or drink after midnight:Sunday   Take these meds the morning of surgery with a sip of water:Zantac  DO NOT wear jewelry, eye make-up, lipstick,body lotion, or dark fingernail polish. Do not shave for 48 hours prior to surgery.  If you are to be admitted after surgery, leave suitcase in car until your room has been assigned.

## 2012-03-14 ENCOUNTER — Other Ambulatory Visit: Payer: Self-pay | Admitting: Obstetrics and Gynecology

## 2012-03-16 ENCOUNTER — Encounter (HOSPITAL_COMMUNITY): Payer: Self-pay | Admitting: Anesthesiology

## 2012-03-16 MED ORDER — GENTAMICIN SULFATE 40 MG/ML IJ SOLN
INTRAVENOUS | Status: AC
  Administered 2012-03-17: 320 mL via INTRAVENOUS
  Filled 2012-03-16: qty 8

## 2012-03-16 NOTE — Anesthesia Preprocedure Evaluation (Addendum)
Anesthesia Evaluation  Patient identified by MRN, date of birth, ID band Patient awake    Reviewed: Allergy & Precautions, H&P , NPO status , Patient's Chart, lab work & pertinent test results  Airway Mallampati: I TM Distance: >3 FB Neck ROM: Full    Dental No notable dental hx. (+) Teeth Intact and Caps   Pulmonary neg pulmonary ROS,  breath sounds clear to auscultation  Pulmonary exam normal       Cardiovascular negative cardio ROS  Rate:Normal     Neuro/Psych  Headaches,  Neuromuscular disease negative psych ROS   GI/Hepatic Neg liver ROS, GERD-  Medicated and Controlled,  Endo/Other  diabetes, Well Controlled, Gestational  Renal/GU negative Renal ROS  negative genitourinary   Musculoskeletal  (+) Fibromyalgia -  Abdominal Normal abdominal exam  (+) + obese,   Peds  Hematology negative hematology ROS (+)   Anesthesia Other Findings   Reproductive/Obstetrics Previous C/Section                          Anesthesia Physical Anesthesia Plan  ASA: II  Anesthesia Plan: Spinal   Post-op Pain Management:    Induction:   Airway Management Planned: Natural Airway  Additional Equipment:   Intra-op Plan:   Post-operative Plan:   Informed Consent: I have reviewed the patients History and Physical, chart, labs and discussed the procedure including the risks, benefits and alternatives for the proposed anesthesia with the patient or authorized representative who has indicated his/her understanding and acceptance.   Dental advisory given  Plan Discussed with: CRNA, Anesthesiologist and Surgeon  Anesthesia Plan Comments:         Anesthesia Quick Evaluation

## 2012-03-17 ENCOUNTER — Inpatient Hospital Stay (HOSPITAL_COMMUNITY)
Admission: AD | Admit: 2012-03-17 | Discharge: 2012-03-20 | DRG: 371 | Disposition: A | Discharge status: Home / Self Care | Payer: BC Managed Care – PPO | Source: Ambulatory Visit | Attending: Obstetrics and Gynecology | Admitting: Obstetrics and Gynecology

## 2012-03-17 ENCOUNTER — Encounter (HOSPITAL_COMMUNITY): Payer: Self-pay | Admitting: Anesthesiology

## 2012-03-17 ENCOUNTER — Encounter (HOSPITAL_COMMUNITY): Payer: Self-pay

## 2012-03-17 ENCOUNTER — Inpatient Hospital Stay (HOSPITAL_COMMUNITY): Payer: BC Managed Care – PPO | Admitting: Anesthesiology

## 2012-03-17 ENCOUNTER — Encounter (HOSPITAL_COMMUNITY)
Admission: AD | Disposition: A | Discharge status: Home / Self Care | Payer: Self-pay | Source: Ambulatory Visit | Attending: Obstetrics and Gynecology

## 2012-03-17 DIAGNOSIS — O3660X0 Maternal care for excessive fetal growth, unspecified trimester, not applicable or unspecified: Secondary | ICD-10-CM | POA: Diagnosis present

## 2012-03-17 DIAGNOSIS — Z98891 History of uterine scar from previous surgery: Secondary | ICD-10-CM

## 2012-03-17 DIAGNOSIS — O34219 Maternal care for unspecified type scar from previous cesarean delivery: Principal | ICD-10-CM | POA: Diagnosis present

## 2012-03-17 DIAGNOSIS — O99814 Abnormal glucose complicating childbirth: Secondary | ICD-10-CM | POA: Diagnosis present

## 2012-03-17 HISTORY — DX: History of uterine scar from previous surgery: Z98.891

## 2012-03-17 LAB — GLUCOSE, CAPILLARY: Glucose-Capillary: 91 mg/dL (ref 70–99)

## 2012-03-17 LAB — TYPE AND SCREEN: ABO/RH(D): A POS

## 2012-03-17 SURGERY — Surgical Case
Anesthesia: Spinal | Site: Abdomen | Wound class: Clean Contaminated

## 2012-03-17 MED ORDER — SODIUM CHLORIDE 0.9 % IV SOLN
1.0000 ug/kg/h | INTRAVENOUS | Status: DC | PRN
  Filled 2012-03-17: qty 2.5

## 2012-03-17 MED ORDER — METHYLERGONOVINE MALEATE 0.2 MG PO TABS: 0.2000 mg | ORAL_TABLET | ORAL | Status: DC | PRN

## 2012-03-17 MED ORDER — GLYCOPYRROLATE 0.2 MG/ML IJ SOLN
INTRAMUSCULAR | Status: AC
  Filled 2012-03-17: qty 1

## 2012-03-17 MED ORDER — SODIUM CHLORIDE 0.9 % IV SOLN: 250.0000 mL | INTRAVENOUS | Status: DC

## 2012-03-17 MED ORDER — EPHEDRINE 5 MG/ML INJ
INTRAVENOUS | Status: AC
  Filled 2012-03-17: qty 10

## 2012-03-17 MED ORDER — SCOPOLAMINE 1 MG/3DAYS TD PT72
1.0000 | MEDICATED_PATCH | TRANSDERMAL | Status: DC
  Administered 2012-03-17: 1.5 mg via TRANSDERMAL

## 2012-03-17 MED ORDER — BISACODYL 10 MG RE SUPP: 10.0000 mg | Freq: Every day | RECTAL | Status: DC | PRN

## 2012-03-17 MED ORDER — DIPHENHYDRAMINE HCL 25 MG PO CAPS
25.0000 mg | ORAL_CAPSULE | ORAL | Status: DC | PRN
  Filled 2012-03-17: qty 1

## 2012-03-17 MED ORDER — SODIUM CHLORIDE 0.9 % IJ SOLN: 3.0000 mL | Freq: Two times a day (BID) | INTRAMUSCULAR | Status: DC

## 2012-03-17 MED ORDER — METHYLERGONOVINE MALEATE 0.2 MG/ML IJ SOLN: 0.2000 mg | INTRAMUSCULAR | Status: DC | PRN

## 2012-03-17 MED ORDER — FERROUS SULFATE 325 (65 FE) MG PO TABS
325.0000 mg | ORAL_TABLET | Freq: Two times a day (BID) | ORAL | Status: DC
  Administered 2012-03-17 – 2012-03-20 (×6): 325 mg via ORAL
  Filled 2012-03-17 (×6): qty 1

## 2012-03-17 MED ORDER — PRENATAL MULTIVITAMIN CH
1.0000 | ORAL_TABLET | Freq: Every day | ORAL | Status: DC
  Administered 2012-03-18 – 2012-03-20 (×3): 1 via ORAL
  Filled 2012-03-17 (×2): qty 1

## 2012-03-17 MED ORDER — BACITRACIN ZINC 500 UNIT/GM EX OINT
TOPICAL_OINTMENT | CUTANEOUS | Status: AC
  Filled 2012-03-17: qty 15

## 2012-03-17 MED ORDER — BUPIVACAINE HCL (PF) 0.25 % IJ SOLN
INTRAMUSCULAR | Status: AC
  Filled 2012-03-17: qty 30

## 2012-03-17 MED ORDER — PHENYLEPHRINE HCL 10 MG/ML IJ SOLN
INTRAMUSCULAR | Status: DC | PRN
  Administered 2012-03-17 (×4): 80 ug via INTRAVENOUS
  Administered 2012-03-17: 40 ug via INTRAVENOUS
  Administered 2012-03-17 (×3): 80 ug via INTRAVENOUS

## 2012-03-17 MED ORDER — FENTANYL CITRATE 0.05 MG/ML IJ SOLN: 25.0000 ug | INTRAMUSCULAR | Status: DC | PRN

## 2012-03-17 MED ORDER — DIBUCAINE 1 % RE OINT: 1.0000 "application " | TOPICAL_OINTMENT | RECTAL | Status: DC | PRN

## 2012-03-17 MED ORDER — SIMETHICONE 80 MG PO CHEW
80.0000 mg | CHEWABLE_TABLET | Freq: Three times a day (TID) | ORAL | Status: DC
  Administered 2012-03-18 – 2012-03-20 (×10): 80 mg via ORAL

## 2012-03-17 MED ORDER — SCOPOLAMINE 1 MG/3DAYS TD PT72
MEDICATED_PATCH | TRANSDERMAL | Status: AC
  Administered 2012-03-17: 1.5 mg via TRANSDERMAL
  Filled 2012-03-17: qty 1

## 2012-03-17 MED ORDER — ONDANSETRON HCL 4 MG/2ML IJ SOLN: 4.0000 mg | INTRAMUSCULAR | Status: DC | PRN

## 2012-03-17 MED ORDER — MORPHINE SULFATE (PF) 0.5 MG/ML IJ SOLN
INTRAMUSCULAR | Status: DC | PRN
  Administered 2012-03-17: .1 mg via INTRATHECAL

## 2012-03-17 MED ORDER — DIPHENHYDRAMINE HCL 50 MG/ML IJ SOLN: 12.5000 mg | INTRAMUSCULAR | Status: DC | PRN

## 2012-03-17 MED ORDER — SENNOSIDES-DOCUSATE SODIUM 8.6-50 MG PO TABS
2.0000 | ORAL_TABLET | Freq: Every day | ORAL | Status: DC
  Administered 2012-03-18 – 2012-03-19 (×3): 2 via ORAL

## 2012-03-17 MED ORDER — LACTATED RINGERS IV SOLN
INTRAVENOUS | Status: DC | PRN
  Administered 2012-03-17 (×3): via INTRAVENOUS

## 2012-03-17 MED ORDER — OXYTOCIN 40 UNITS IN LACTATED RINGERS INFUSION - SIMPLE MED: 62.5000 mL/h | INTRAVENOUS | Status: AC

## 2012-03-17 MED ORDER — FENTANYL CITRATE 0.05 MG/ML IJ SOLN
INTRAMUSCULAR | Status: DC | PRN
  Administered 2012-03-17: 25 ug via INTRATHECAL

## 2012-03-17 MED ORDER — OXYCODONE-ACETAMINOPHEN 5-325 MG PO TABS
1.0000 | ORAL_TABLET | ORAL | Status: DC | PRN
  Administered 2012-03-18: 2 via ORAL
  Administered 2012-03-18 (×4): 1 via ORAL
  Administered 2012-03-19: 2 via ORAL
  Administered 2012-03-19: 1 via ORAL
  Administered 2012-03-19 (×2): 2 via ORAL
  Administered 2012-03-19 – 2012-03-20 (×3): 1 via ORAL
  Filled 2012-03-17 (×2): qty 1
  Filled 2012-03-17: qty 2
  Filled 2012-03-17 (×2): qty 1
  Filled 2012-03-17 (×2): qty 2
  Filled 2012-03-17 (×3): qty 1
  Filled 2012-03-17: qty 2
  Filled 2012-03-17: qty 1

## 2012-03-17 MED ORDER — FLEET ENEMA 7-19 GM/118ML RE ENEM: 1.0000 | ENEMA | Freq: Every day | RECTAL | Status: DC | PRN

## 2012-03-17 MED ORDER — LACTATED RINGERS IV SOLN
INTRAVENOUS | Status: DC
  Administered 2012-03-17: 125 mL/h via INTRAVENOUS

## 2012-03-17 MED ORDER — ONDANSETRON HCL 4 MG PO TABS: 4.0000 mg | ORAL_TABLET | ORAL | Status: DC | PRN

## 2012-03-17 MED ORDER — FENTANYL CITRATE 0.05 MG/ML IJ SOLN
INTRAMUSCULAR | Status: AC
  Filled 2012-03-17: qty 2

## 2012-03-17 MED ORDER — NALBUPHINE HCL 10 MG/ML IJ SOLN
5.0000 mg | INTRAMUSCULAR | Status: DC | PRN
  Filled 2012-03-17: qty 1

## 2012-03-17 MED ORDER — OXYTOCIN 10 UNIT/ML IJ SOLN
40.0000 [IU] | INTRAVENOUS | Status: DC | PRN
  Administered 2012-03-17: 40 [IU] via INTRAVENOUS

## 2012-03-17 MED ORDER — ZOLPIDEM TARTRATE 5 MG PO TABS: 5.0000 mg | ORAL_TABLET | Freq: Every evening | ORAL | Status: DC | PRN

## 2012-03-17 MED ORDER — PHENYLEPHRINE 40 MCG/ML (10ML) SYRINGE FOR IV PUSH (FOR BLOOD PRESSURE SUPPORT)
PREFILLED_SYRINGE | INTRAVENOUS | Status: AC
  Filled 2012-03-17: qty 5

## 2012-03-17 MED ORDER — SIMETHICONE 80 MG PO CHEW: 80.0000 mg | CHEWABLE_TABLET | ORAL | Status: DC | PRN

## 2012-03-17 MED ORDER — BUPIVACAINE HCL (PF) 0.25 % IJ SOLN
INTRAMUSCULAR | Status: DC | PRN
  Administered 2012-03-17: 7 mL

## 2012-03-17 MED ORDER — DIPHENHYDRAMINE HCL 25 MG PO CAPS: 25.0000 mg | ORAL_CAPSULE | Freq: Four times a day (QID) | ORAL | Status: DC | PRN

## 2012-03-17 MED ORDER — PHENYLEPHRINE 40 MCG/ML (10ML) SYRINGE FOR IV PUSH (FOR BLOOD PRESSURE SUPPORT)
PREFILLED_SYRINGE | INTRAVENOUS | Status: AC
  Filled 2012-03-17: qty 10

## 2012-03-17 MED ORDER — ONDANSETRON HCL 4 MG/2ML IJ SOLN: 4.0000 mg | Freq: Three times a day (TID) | INTRAMUSCULAR | Status: DC | PRN

## 2012-03-17 MED ORDER — MORPHINE SULFATE 0.5 MG/ML IJ SOLN
INTRAMUSCULAR | Status: AC
  Filled 2012-03-17: qty 10

## 2012-03-17 MED ORDER — WITCH HAZEL-GLYCERIN EX PADS: 1.0000 "application " | MEDICATED_PAD | CUTANEOUS | Status: DC | PRN

## 2012-03-17 MED ORDER — DIPHENHYDRAMINE HCL 50 MG/ML IJ SOLN: 25.0000 mg | INTRAMUSCULAR | Status: DC | PRN

## 2012-03-17 MED ORDER — SODIUM CHLORIDE 0.9 % IJ SOLN: 3.0000 mL | INTRAMUSCULAR | Status: DC | PRN

## 2012-03-17 MED ORDER — ONDANSETRON HCL 4 MG/2ML IJ SOLN
INTRAMUSCULAR | Status: DC | PRN
  Administered 2012-03-17: 4 mg via INTRAVENOUS

## 2012-03-17 MED ORDER — NALOXONE HCL 0.4 MG/ML IJ SOLN: 0.4000 mg | INTRAMUSCULAR | Status: DC | PRN

## 2012-03-17 MED ORDER — METOCLOPRAMIDE HCL 5 MG/ML IJ SOLN: 10.0000 mg | Freq: Three times a day (TID) | INTRAMUSCULAR | Status: DC | PRN

## 2012-03-17 MED ORDER — ONDANSETRON HCL 4 MG/2ML IJ SOLN
INTRAMUSCULAR | Status: AC
  Filled 2012-03-17: qty 2

## 2012-03-17 MED ORDER — MEPERIDINE HCL 25 MG/ML IJ SOLN: 6.2500 mg | INTRAMUSCULAR | Status: DC | PRN

## 2012-03-17 MED ORDER — IBUPROFEN 600 MG PO TABS
600.0000 mg | ORAL_TABLET | Freq: Four times a day (QID) | ORAL | Status: DC
  Administered 2012-03-17 – 2012-03-20 (×12): 600 mg via ORAL
  Filled 2012-03-17 (×12): qty 1

## 2012-03-17 MED ORDER — OXYTOCIN 10 UNIT/ML IJ SOLN
INTRAMUSCULAR | Status: AC
  Filled 2012-03-17: qty 1

## 2012-03-17 MED ORDER — MENTHOL 3 MG MT LOZG: 1.0000 | LOZENGE | OROMUCOSAL | Status: DC | PRN

## 2012-03-17 MED ORDER — BUPIVACAINE IN DEXTROSE 0.75-8.25 % IT SOLN
INTRATHECAL | Status: DC | PRN
  Administered 2012-03-17: 1.5 mL via INTRATHECAL

## 2012-03-17 MED ORDER — EPHEDRINE SULFATE 50 MG/ML IJ SOLN
INTRAMUSCULAR | Status: DC | PRN
  Administered 2012-03-17: 25 mg via INTRAVENOUS
  Administered 2012-03-17: 10 mg via INTRAVENOUS
  Administered 2012-03-17: 5 mg via INTRAVENOUS
  Administered 2012-03-17: 10 mg via INTRAVENOUS

## 2012-03-17 MED ORDER — LANOLIN HYDROUS EX OINT: 1.0000 "application " | TOPICAL_OINTMENT | CUTANEOUS | Status: DC | PRN

## 2012-03-17 SURGICAL SUPPLY — 46 items
APL SKNCLS STERI-STRIP NONHPOA (GAUZE/BANDAGES/DRESSINGS)
BARRIER ADHS 3X4 INTERCEED (GAUZE/BANDAGES/DRESSINGS) ×1 IMPLANT
BENZOIN TINCTURE PRP APPL 2/3 (GAUZE/BANDAGES/DRESSINGS) IMPLANT
BRR ADH 4X3 ABS CNTRL BYND (GAUZE/BANDAGES/DRESSINGS) ×1
CLOTH BEACON ORANGE TIMEOUT ST (SAFETY) ×2 IMPLANT
CONTAINER PREFILL 10% NBF 15ML (MISCELLANEOUS) IMPLANT
DRAPE SURG 17X23 STRL (DRAPES) ×2 IMPLANT
DRESSING TELFA 8X3 (GAUZE/BANDAGES/DRESSINGS) ×2 IMPLANT
DRSG COVADERM 4X10 (GAUZE/BANDAGES/DRESSINGS) ×2 IMPLANT
DURAPREP 26ML APPLICATOR (WOUND CARE) ×2 IMPLANT
ELECT REM PT RETURN 9FT ADLT (ELECTROSURGICAL) ×2
ELECTRODE REM PT RTRN 9FT ADLT (ELECTROSURGICAL) ×1 IMPLANT
EXTRACTOR VACUUM KIWI (MISCELLANEOUS) IMPLANT
EXTRACTOR VACUUM M CUP 4 TUBE (SUCTIONS) IMPLANT
GAUZE SPONGE 4X4 12PLY STRL LF (GAUZE/BANDAGES/DRESSINGS) ×4 IMPLANT
GLOVE BIO SURGEON STRL SZ 6.5 (GLOVE) ×2 IMPLANT
GLOVE BIOGEL PI IND STRL 7.0 (GLOVE) ×2 IMPLANT
GLOVE BIOGEL PI INDICATOR 7.0 (GLOVE) ×2
GOWN PREVENTION PLUS LG XLONG (DISPOSABLE) ×6 IMPLANT
KIT ABG SYR 3ML LUER SLIP (SYRINGE) IMPLANT
NDL HYPO 25X1 1.5 SAFETY (NEEDLE) ×1 IMPLANT
NDL HYPO 25X5/8 SAFETYGLIDE (NEEDLE) IMPLANT
NEEDLE HYPO 25X1 1.5 SAFETY (NEEDLE) ×2 IMPLANT
NEEDLE HYPO 25X5/8 SAFETYGLIDE (NEEDLE) ×2 IMPLANT
NS IRRIG 1000ML POUR BTL (IV SOLUTION) ×2 IMPLANT
PACK C SECTION WH (CUSTOM PROCEDURE TRAY) ×2 IMPLANT
PAD ABD 7.5X8 STRL (GAUZE/BANDAGES/DRESSINGS) ×2 IMPLANT
PAD OB MATERNITY 4.3X12.25 (PERSONAL CARE ITEMS) ×1 IMPLANT
RTRCTR C-SECT PINK 25CM LRG (MISCELLANEOUS) IMPLANT
SLEEVE SCD COMPRESS KNEE MED (MISCELLANEOUS) IMPLANT
STAPLER VISISTAT 35W (STAPLE) ×1 IMPLANT
STRIP CLOSURE SKIN 1/2X4 (GAUZE/BANDAGES/DRESSINGS) IMPLANT
SUT CHROMIC GUT AB #0 18 (SUTURE) IMPLANT
SUT MNCRL 0 VIOLET CTX 36 (SUTURE) ×3 IMPLANT
SUT MON AB 4-0 PS1 27 (SUTURE) ×1 IMPLANT
SUT MONOCRYL 0 CTX 36 (SUTURE) ×2
SUT PLAIN 2 0 (SUTURE) ×2
SUT PLAIN ABS 2-0 CT1 27XMFL (SUTURE) IMPLANT
SUT VIC AB 0 CT1 27 (SUTURE) ×4
SUT VIC AB 0 CT1 27XBRD ANBCTR (SUTURE) ×2 IMPLANT
SUT VIC AB 2-0 CT1 27 (SUTURE) ×2
SUT VIC AB 2-0 CT1 TAPERPNT 27 (SUTURE) ×1 IMPLANT
SYR CONTROL 10ML LL (SYRINGE) ×2 IMPLANT
TOWEL OR 17X24 6PK STRL BLUE (TOWEL DISPOSABLE) ×4 IMPLANT
TRAY FOLEY CATH 14FR (SET/KITS/TRAYS/PACK) ×1 IMPLANT
WATER STERILE IRR 1000ML POUR (IV SOLUTION) ×2 IMPLANT

## 2012-03-17 NOTE — Brief Op Note (Signed)
03/17/2012  8:24 AM  PATIENT:  Abigail Singh  36 y.o. female  PRE-OPERATIVE DIAGNOSIS:  Previous Cesarean Section, Gestational Diabetes( Class A1), Fetal macrosomia  POST-OPERATIVE DIAGNOSIS:  previous cesarean section, Class A1 GDM PROCEDURE:  Procedure(s) (LRB) with comments: CESAREAN SECTION (N/A) - Repeat C/S.  EDD: 03/23/12 Sharl Ma hysterotomy  SURGEON:  Surgeon(s) and Role:    * Serita Kyle, MD - Primary  PHYSICIAN ASSISTANT:   ASSISTANTS: Arlan Organ, CNM D. Connye Burkitt, CNM   ANESTHESIA:   spinal  Findings:  Live female ROT Apgar 9/10, nl tubes and ovaries, ant placenta  EBL:  Total I/O In: 2000 [I.V.:2000] Out: 600 [Urine:100; Blood:500]  BLOOD ADMINISTERED:none  DRAINS: none   LOCAL MEDICATIONS USED:  MARCAINE     SPECIMEN:  Source of Specimen:  placenta  DISPOSITION OF SPECIMEN:  N/A  COUNTS:  YES  TOURNIQUET:  * No tourniquets in log *  DICTATION: .Other Dictation: Dictation Number   PLAN OF CARE: Admit to inpatient   PATIENT DISPOSITION:  PACU - hemodynamically stable.   Delay start of Pharmacological VTE agent (>24hrs) due to surgical blood loss or risk of bleeding: no

## 2012-03-17 NOTE — Transfer of Care (Signed)
Immediate Anesthesia Transfer of Care Note  Patient: Abigail Singh  Procedure(s) Performed: Procedure(s) (LRB) with comments: CESAREAN SECTION (N/A) - Repeat C/S.  EDD: 03/23/12  Patient Location: PACU  Anesthesia Type:Spinal  Level of Consciousness: awake, alert  and oriented  Airway & Oxygen Therapy: Patient Spontanous Breathing  Post-op Assessment: Report given to PACU RN  Post vital signs: Reviewed and stable  Complications: No apparent anesthesia complications
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
## 2012-03-17 NOTE — Anesthesia Procedure Notes (Signed)
Spinal  Patient location during procedure: OR Start time: 03/17/2012 7:35 AM Staffing Anesthesiologist: Brayton Caves R Performed by: anesthesiologist  Preanesthetic Checklist Completed: patient identified, site marked, surgical consent, pre-op evaluation, timeout performed, IV checked, risks and benefits discussed and monitors and equipment checked Spinal Block Patient position: sitting Prep: DuraPrep Patient monitoring: heart rate, cardiac monitor, continuous pulse ox and blood pressure Approach: midline Location: L3-4 Injection technique: single-shot Needle Needle type: Sprotte  Needle gauge: 24 G Needle length: 9 cm Assessment Sensory level: T4 Additional Notes Patient identified.  Risk benefits discussed including failed block, incomplete pain control, headache, nerve damage, paralysis, blood pressure changes, nausea, vomiting, reactions to medication both toxic or allergic, and postpartum back pain.  Patient expressed understanding and wished to proceed.  All questions were answered.  Sterile technique used throughout procedure.  CSF was clear.  No parasthesia or other complications.  Please see nursing notes for vital signs.

## 2012-03-17 NOTE — Consult Note (Signed)
Neonatology Note:   Attendance at C-section:    I was asked to attend this repeat C/S at term. The mother is a G4P2A2 A pos, GBS neg with diet-controlled GDM, fetal macrosomia, fibromyalgia, IBS, and PTSD. Prenatal screening showed an elevated risk for Downs, but ultrasound showed no specific abnormalities. ROM at delivery, fluid clear. Infant vigorous with good spontaneous cry and tone. Needed only minimal bulb suctioning. Ap 9/10. Lungs clear to ausc in DR. No stigmata of Downs noted, discussed with father. PE remarkable for being LGA and asymmetry of mouth, but feel this may be due to mild facial asymmetry from in utero position, needs recheck. This was also shown to father in OR suite. To CN to care of Pediatrician.   Deatra James, MD

## 2012-03-17 NOTE — Anesthesia Postprocedure Evaluation (Signed)
Anesthesia Post Note  Patient: Abigail Singh  Procedure(s) Performed: Procedure(s) (LRB): CESAREAN SECTION (N/A)  Anesthesia type: Spinal  Patient location: PACU  Post pain: Pain level controlled  Post assessment: Post-op Vital signs reviewed  Last Vitals:  Filed Vitals:   03/17/12 0845  BP: 93/55  Pulse: 75  Temp:   Resp: 20    Post vital signs: Reviewed  Level of consciousness: awake  Complications: No apparent anesthesia complications

## 2012-03-18 ENCOUNTER — Encounter (HOSPITAL_COMMUNITY): Payer: Self-pay | Admitting: *Deleted

## 2012-03-18 LAB — CBC
HCT: 35.3 % — ABNORMAL LOW (ref 36.0–46.0)
MCHC: 32.3 g/dL (ref 30.0–36.0)
Platelets: 152 10*3/uL (ref 150–400)
RDW: 16.3 % — ABNORMAL HIGH (ref 11.5–15.5)
WBC: 12 10*3/uL — ABNORMAL HIGH (ref 4.0–10.5)

## 2012-03-18 LAB — GLUCOSE, CAPILLARY: Glucose-Capillary: 122 mg/dL — ABNORMAL HIGH (ref 70–99)

## 2012-03-18 NOTE — Anesthesia Postprocedure Evaluation (Signed)
  Anesthesia Post-op Note  Patient: Abigail Singh  Procedure(s) Performed: Procedure(s) (LRB) with comments: CESAREAN SECTION (N/A) - Repeat C/S.  EDD: 03/23/12  Patient Location: Mother/Baby  Anesthesia Type:Spinal  Level of Consciousness: awake, alert  and oriented  Airway and Oxygen Therapy: Patient Spontanous Breathing  Post-op Pain: none  Post-op Assessment: Post-op Vital signs reviewed, Patient's Cardiovascular Status Stable, No headache, No backache, No residual numbness and No residual motor weakness  Post-op Vital Signs: Reviewed and stable  Complications: No apparent anesthesia complications

## 2012-03-18 NOTE — Progress Notes (Signed)
Patient ID: Abigail Singh, female   DOB: 05-Dec-1975, 36 y.o.   MRN: [id number] POD # 1  Subjective: Pt reports feeling well/ Pain controlled with ibuprofen; has not taken percocet Tolerating po/ Foley d/c'ed and pt is voiding without problems/ No n/v/Flatus neg Activity: out of bed and ambulate Bleeding is light Newborn info:  Information for the patient's newborn:  Singh, Abigail Abigail [[id number]]  female Feeding: breast   Objective: VS: Blood pressure 98/63, pulse 73, temperature 97.8 F (36.6 C), temperature source Oral, resp. rate 18  Intake/Output Summary (Last 24 hours) at 03/18/12 0934 Last data filed at 03/18/12 0221  Gross per 24 hour  Intake   1805 ml  Output   5200 ml  Net  -3395 ml      Basename 03/18/12 0507  WBC 12.0*  HGB 11.4*  HCT 35.3*  PLT 152    Blood type: --/--/A POS (11/11 0615) Rubella:      Physical Exam:  General: alert, cooperative and no distress CV: Regular rate and rhythm Resp: clear Abdomen: soft, nontender, normal bowel sounds Incision: covered with dressing, C/D/I Uterine Fundus: firm, below umbilicus, nontender Lochia: minimal Ext: edema trace    A/P: POD # 1/ [id number] S/P C/Section d/t planned repeat D/C IV and pt up to shower Doing well Continue routine post op orders   Signed: Demetrius Revel, MSN, [facility] 03/18/2012, 9:34 AM

## 2012-03-18 NOTE — Op Note (Signed)
Abigail Singh, Abigail Singh              ACCOUNT NO.:  [id number]  MEDICAL RECORD NO.:  [id number]  LOCATION:  9130                          FACILITY:  [facility]  PHYSICIAN:  Maxie Better, M.D.DATE OF BIRTH:  Jan 20, 1976  DATE OF PROCEDURE:  03/17/2012 DATE OF DISCHARGE:                              OPERATIVE REPORT   PREOPERATIVE DIAGNOSES:  Previous cesarean section, class A1 gestational diabetes, fetal macrosomia.  PROCEDURE:  Repeat cesarean section, Kerr hysterotomy.  POSTOPERATIVE DIAGNOSES:  Previous cesarean section, class A1 gestational diabetes, fetal macrosomia.  ANESTHESIA:  Spinal.  SURGEON:  Maxie Better, MD  ASSISTANT:  Arlan Organ, CNM and Katherine Roan, CNM  PROCEDURE:  Under adequate spinal anesthesia, the patient was placed in the supine position with a left lateral tilt.  She was sterilely prepped and draped in usual fashion.  An indwelling Foley catheter was sterilely placed.  A 0.25% Marcaine was injected along the previous Pfannenstiel skin incision site.  Pfannenstiel skin incision was then made, carried down to the rectus fascia.  Rectus fascia was opened transversely.  The rectus fascia was then bluntly and sharply dissected off the rectus muscle in the inferior fashion.  Superiorly there was already separation of the rectus muscle with parietal peritoneum being seen.  At that point, the parietal peritoneum was opened bluntly and the dissection of the rectus fascia off of the rectus muscles were then continued under direct visualization.  The incision in the parietal peritoneum was extended superiorly and inferiorly.  The vesicouterine peritoneum was then opened transversely.  The bladder was then gently dissected off the lower uterine segment and displaced with a bladder retractor.  A curvilinear low transverse uterine incision was then made and extended with bandage scissors.  Thin lower uterine segment was noted.  The baby was subsequently  delivered en caul and subsequently with spontaneous rupture of membranes clear fluid, live female was delivered, bulb suctioned the abdomen.  The cord was clamped and cut.  The baby was transferred to the awaiting pediatrician, who assigned Apgars of 9 and 10 at 1 and 5 minutes.  The placenta which was anterior was manually removed.  Uterine cavity was cleaned of debris.  Uterine incision had no extension, was closed in 2 layers, the first layer with 0 Monocryl running locked stitch, second layer was imbricated using 0 Monocryl suture.  Good hemostasis noted along the incision line.  Normal tubes and ovaries were noted bilaterally.  The abdomen was irrigated and suctioned of debris.  The paracolic gutters were suctioned of debris. Small bleeders were cauterized.  Interceed was placed overlying the incision.  There were omental adhesions that was on the anterior abdominal wall which was lysed.  The parietal peritoneum was then closed with 2-0 Vicryl.  The rectus fascia was closed with 0 Vicryl x2.  The subcutaneous area was irrigated, small bleeders cauterized. Interrupted 2-0 plain sutures placed and the skin approximated with Ethicon staples.  SPECIMENS:  Placenta not sent to Pathology.  ESTIMATED BLOOD LOSS:  500 mL.  INTRAOPERATIVE FLUID:  2 L.  URINE OUTPUT:  100 mL clear yellow urine.  Sponge and instrument counts x2 was correct.  COMPLICATION:  None.  The patient tolerated the  procedure well and was transferred to recovery in stable condition.     Maxie Better, M.D.     [place]/MEDQ  D:  03/17/2012  T:  03/18/2012  Job:  [id number]

## 2012-03-18 NOTE — Addendum Note (Signed)
Addendum  created 03/18/12 0850 by Shanon Payor, CRNA   Modules edited:Notes Section

## 2012-03-19 NOTE — Progress Notes (Signed)
Subjective: Postpartum Day 2: Cesarean Delivery 9 Repeat C/S. Patient reports tolerating PO, + flatus and no problems voiding.  No BM as yet. no complaints, up ad lib without syncope Pain well controlled with po meds BF:  Baby feeding on demand. Mood stable, bonding well  Objective: Vital signs in last 24 hours: Temp:  [97.2 F (36.2 C)-98 F (36.7 C)] 97.6 F (36.4 C) (11/13 0533) Pulse Rate:  [56-77] 77  (11/13 0533) Resp:  [18] 18  (11/13 0533) BP: (106-116)/(56-77) 106/70 mmHg (11/13 0533)  Physical Exam:  General: alert, cooperative and no distress Breasts:  Heart: RRR Lungs: CTAB Abdomen: BS x4 Uterine Fundus: firm Incision: healing well, no significant drainage, Staples in place and no signs of Irritation from sam. R/o xat 7 days as per instruction Camilla MD. Patient to return to the office on Monday.  Lochia: appropriate DVT Evaluation: No evidence of DVT seen on physical exam. Negative Homan's sign. No significant calf/ankle edema.   Basename 03/18/12 0507  HGB 11.4*  HCT 35.3*  Patient has standing order for ferrous Sulfate in PP orders.  Assessment/Plan: Status post Cesarean section. Doing well postoperatively.  Continue current care. Will discharge in am on POD 3  Ercell Perlman, CNM. 03/19/2012, 8:43 AM

## 2012-03-20 MED ORDER — POLYSACCHARIDE IRON COMPLEX 150 MG PO CAPS: 150.0000 mg | ORAL_CAPSULE | Freq: Every day | ORAL | Status: DC

## 2012-03-20 MED ORDER — OXYCODONE-ACETAMINOPHEN 5-325 MG PO TABS: 1.0000 | ORAL_TABLET | Freq: Four times a day (QID) | ORAL | Status: DC | PRN

## 2012-03-20 MED ORDER — IBUPROFEN 600 MG PO TABS: 600.0000 mg | ORAL_TABLET | Freq: Four times a day (QID) | ORAL | Status: DC

## 2012-03-20 NOTE — Progress Notes (Signed)
Subjective: Postpartum Day3 : Cesarean Delivery - RLTCS Patient reports incisional pain, + flatus, + BM and no problems voiding.  Po analgesia for incision pain. no complaints, up ad lib without syncope Pain well controlled with po meds BF: infant had been cluster feeding over night and had been supplemented with formula. The patient has been advised to supplement with Similac Supplemental by Peds as baby had lost 11% of birth weight. With F/u with peds in am. Baby has been discharged. Mood stable, bonding well   Objective: Vital signs in last 24 hours: Temp:  [97.7 F (36.5 C)-98.2 F (36.8 C)] 97.7 F (36.5 C) (11/14 0502) Pulse Rate:  [84-97] 86  (11/14 0502) Resp:  [18-20] 18  (11/14 0502) BP: (107-122)/(56-64) 107/58 mmHg (11/14 0502)  Physical Exam:  General: patient is alert and feels with some incisional pain. Has been ambulating well in the halls Breasts: areolar area slightly tender this morning as the baby has been cluster feeding all night. ADvised Colostrum to be applied to area and offered" Dr Allene Pyo Compounded Nipple Cream". [facility] [employer]) Patient states that she will see how is goes before using ointment on nipples. May call for this cream in the future. Heart: RRR Lungs: CTAB Abdomen: BS x4 and BM last night Uterine Fundus: firm Incision: healing well, no significant drainage, no dehiscence, no significant erythema, to return to office x 7 days for R/O staples. (RLTCS)   Lochia: appropriate DVT Evaluation: No evidence of DVT seen on physical exam. Negative Homan's sign. No cords or calf tenderness. No significant calf/ankle edema.   Basename 03/18/12 0507  HGB 11.4*  HCT 35.3*    Assessment/Plan: Status post Cesarean section. Doing well postoperatively.  Discharge home with standard precautions and return to clinic x 7 days for r/o staples and 6 weeks postpartum visit.  Bion Todorov, CNM. 03/20/2012, 10:14 AM

## 2012-03-20 NOTE — Discharge Summary (Signed)
Obstetric Discharge Summary Reason for Admission: cesarean section scheduled RLTCS Prenatal Procedures: ultrasound Intrapartum Procedures: cesarean: low cervical, transverse Postpartum Procedures: none Complications-Operative and Postpartum: none Hemoglobin  Date Value Range Status  03/18/2012 11.4* 12.0 - 15.0 g/dL Final     HCT  Date Value Range Status  03/18/2012 35.3* 36.0 - 46.0 % Final    Physical Exam:  General: alert, cooperative and no distress Lochia: appropriate Uterine Fundus: firm Incision: healing well, no significant drainage, no dehiscence, no significant erythema, for R/O staples POD 7 at office. DVT Evaluation: No evidence of DVT seen on physical exam. Negative Homan's sign. No cords or calf tenderness. No significant calf/ankle edema.  Discharge Diagnoses: Term Pregnancy-delivered  Discharge Information: Date: 03/20/2012 Activity: pelvic rest and to avoid core abdominal exercises x 6 weeks Diet: routine - advised iron rich diet and continue on PNV and po iron supplement. Medications: PNV, Ibuprofen and Percocet Condition: stable Instructions: refer to practice specific booklet Discharge to: home Follow-up Information    Follow up with [facility] Schedule an appointment as soon as possible for a visit in 7 days. (R/o staples)    Contact information:   [address] [place] [address] [phone]         Newborn Data: Live born female  Birth Weight: 8 lb 13.8 oz (4020 g) APGAR: 9, 10  Home with mother.  Joao Mccurdy, CNM. 03/20/2012, 10:31 AM

## 2012-03-24 ENCOUNTER — Encounter (HOSPITAL_COMMUNITY)
Admission: RE | Admit: 2012-03-24 | Discharge: 2012-03-24 | Disposition: A | Discharge status: Home / Self Care | Payer: BC Managed Care – PPO | Source: Ambulatory Visit | Attending: Obstetrics and Gynecology | Admitting: Obstetrics and Gynecology

## 2012-03-24 ENCOUNTER — Ambulatory Visit (HOSPITAL_COMMUNITY)
Admission: RE | Admit: 2012-03-24 | Discharge: 2012-03-24 | Disposition: A | Discharge status: Home / Self Care | Payer: BC Managed Care – PPO | Source: Ambulatory Visit | Attending: Obstetrics and Gynecology | Admitting: Obstetrics and Gynecology

## 2012-03-24 DIAGNOSIS — O923 Agalactia: Secondary | ICD-10-CM | POA: Insufficient documentation

## 2012-03-24 NOTE — Progress Notes (Signed)
Adult Lactation Consultation Outpatient Visit Note  Patient Name: Abigail Singh Date of Birth: June 11, 1975 Gestational Age at Delivery: Unknown Type of Delivery:   Breastfeeding History: Frequency of Breastfeeding: q 1-2 hours- feeds a lot and is fussy through the night, Is giving about 2 oz of formula at night Length of Feeding: 30-60 minutes Voids: 6-8 Stools: 4  Supplementing / Method: Pumping:  Type of Pump:Manual pump   Frequency: 4 x/day  Volume:  1 oz  Comments:    Consultation Evaluation:  Initial Feeding Assessment: Pre-feed Weight: 8-5.7  3790g Post-feed Weight: 8-6.9  3826 Amount Transferred: 36 cc's Comments: Baby nursed for 15 minutes on right breast.Assisted mom with getting deeper latch and mom reports that feels much better. Reviewed football hold and wide open mouth, most of the areola in baby's mouth. Nipple round when baby comes off the breast.  Additional Feeding Assessment: Pre-feed Weight: 8-6.9  3826g Post-feed Weight: 8-8.4  3868g Amount Transferred:42 cc's Comments: She nursed for 20 minutes on the left breast. Mom doing better with deeper latch. She reports that feels much better. Nipple round when baby comes off the breast.   Total Breast milk Transferred this Visit: 78 cc's Total Supplement Given: 0  Additional Interventions: Baby was rooting so she nursed for a few more minutes after weight and she had her clothes on- did not reweigh. [employer] completes. Encouraged mom to pump after feedings during the daytime for stimulation. As she makes more milk can decreasr pumping. Rest as much as possible.No further questions. To call prn  Follow-Up   With Ped.   Pamelia Hoit 03/24/2012, 5:16 PM

## 2012-03-31 ENCOUNTER — Ambulatory Visit (HOSPITAL_COMMUNITY): Admission: RE | Admit: 2012-03-31 | Payer: BC Managed Care – PPO | Source: Ambulatory Visit

## 2012-04-23 ENCOUNTER — Encounter (HOSPITAL_COMMUNITY)
Admission: RE | Admit: 2012-04-23 | Discharge: 2012-04-23 | Disposition: A | Discharge status: Home / Self Care | Payer: BC Managed Care – PPO | Source: Ambulatory Visit | Attending: Obstetrics and Gynecology | Admitting: Obstetrics and Gynecology

## 2012-04-23 DIAGNOSIS — O923 Agalactia: Secondary | ICD-10-CM | POA: Insufficient documentation

## 2012-04-25 ENCOUNTER — Encounter: Payer: Self-pay | Admitting: Internal Medicine

## 2012-05-07 LAB — HM COLONOSCOPY

## 2012-05-13 ENCOUNTER — Ambulatory Visit (INDEPENDENT_AMBULATORY_CARE_PROVIDER_SITE_OTHER): Payer: BC Managed Care – PPO | Admitting: Family Medicine

## 2012-05-13 ENCOUNTER — Encounter: Payer: Self-pay | Admitting: Family Medicine

## 2012-05-13 VITALS — BP 104/62 | HR 80 | Temp 97.8°F | Wt 172.0 lb

## 2012-05-13 DIAGNOSIS — H6690 Otitis media, unspecified, unspecified ear: Secondary | ICD-10-CM

## 2012-05-13 MED ORDER — AZITHROMYCIN 250 MG PO TABS: ORAL_TABLET | ORAL | Status: DC

## 2012-05-13 MED ORDER — CLARITHROMYCIN ER 500 MG PO TB24: 1000.0000 mg | ORAL_TABLET | Freq: Every day | ORAL | Status: DC

## 2012-05-13 NOTE — Patient Instructions (Addendum)
Otitis Externa Otitis externa is a bacterial or fungal infection of the outer ear canal. This is the area from the eardrum to the outside of the ear. Otitis externa is sometimes called "swimmer's ear." CAUSES  Possible causes of infection include:  Swimming in dirty water.  Moisture remaining in the ear after swimming or bathing.  Mild injury (trauma) to the ear.  Objects stuck in the ear (foreign body).  Cuts or scrapes (abrasions) on the outside of the ear. SYMPTOMS  The first symptom of infection is often itching in the ear canal. Later signs and symptoms may include swelling and redness of the ear canal, ear pain, and yellowish-white fluid (pus) coming from the ear. The ear pain may be worse when pulling on the earlobe. DIAGNOSIS  Your caregiver will perform a physical exam. A sample of fluid may be taken from the ear and examined for bacteria or fungi. TREATMENT  Antibiotic ear drops are often given for 10 to 14 days. Treatment may also include pain medicine or corticosteroids to reduce itching and swelling. PREVENTION   Keep your ear dry. Use the corner of a towel to absorb water out of the ear canal after swimming or bathing.  Avoid scratching or putting objects inside your ear. This can damage the ear canal or remove the protective wax that lines the canal. This makes it easier for bacteria and fungi to grow.  Avoid swimming in lakes, polluted water, or poorly chlorinated pools.  You may use ear drops made of rubbing alcohol and vinegar after swimming. Combine equal parts of white vinegar and alcohol in a bottle. Put 3 or 4 drops into each ear after swimming. HOME CARE INSTRUCTIONS   Apply antibiotic ear drops to the ear canal as prescribed by your caregiver.  Only take over-the-counter or prescription medicines for pain, discomfort, or fever as directed by your caregiver.  If you have diabetes, follow any additional treatment instructions from your caregiver.  Keep all  follow-up appointments as directed by your caregiver. SEEK MEDICAL CARE IF:   You have a fever.  Your ear is still red, swollen, painful, or draining pus after 3 days.  Your redness, swelling, or pain gets worse.  You have a severe headache.  You have redness, swelling, pain, or tenderness in the area behind your ear. MAKE SURE YOU:   Understand these instructions.  Will watch your condition.  Will get help right away if you are not doing well or get worse. Document Released: 04/23/2005 Document Revised: 07/16/2011 Document Reviewed: 05/10/2011 Exit[employer] Patient Information 2013 [employer].  

## 2012-05-13 NOTE — Progress Notes (Signed)
  Subjective:     LAINE FONNER is a 37 y.o. female who presents with ear pain and possible ear infection. Symptoms include: right ear pain, congestion, cough, plugged sensation in the right ear and sore throat. Onset of symptoms was several days ago, and have been gradually worsening since that time. Associated symptoms include: congestion, post nasal drip and sore throat.  Patient denies: achiness, chills, coryza, fever , headache, low grade fever, non productive cough and sneezing. She is drinking plenty of fluids.  The following portions of the patient's history were reviewed and updated as appropriate: allergies, current medications, past family history, past medical history, past social history, past surgical history and problem list.  Review of Systems Pertinent items are noted in HPI.   Objective:    BP 104/62  Pulse 80  Temp 97.8 F (36.6 C) (Oral)  Wt 172 lb (78.019 kg)  SpO2 98%  Breastfeeding? Yes General:  alert, cooperative, appears stated age and no distress  Right Ear: diminished mobility  Left Ear: normal landmarks and mobility  Mouth:  abnormal findings: mild oropharyngeal erythema  Neck: mild anterior cervical adenopathy, supple, symmetrical, trachea midline and thyroid not enlarged, symmetric, no tenderness/mass/nodules     Assessment:    Right acute otitis media   Plan:    Treatment: Azithromycin. Antihistamine and nasonex OTC analgesia as needed. Fluids, rest, avoid carbonated/alcoholic and caffeinated beverages.  Follow up in a few weeks if not improving.

## 2012-05-20 ENCOUNTER — Ambulatory Visit (AMBULATORY_SURGERY_CENTER): Payer: BC Managed Care – PPO

## 2012-05-20 ENCOUNTER — Encounter: Payer: Self-pay | Admitting: Internal Medicine

## 2012-05-20 VITALS — Ht 64.0 in | Wt 169.8 lb

## 2012-05-20 DIAGNOSIS — Z8371 Family history of colonic polyps: Secondary | ICD-10-CM

## 2012-05-20 DIAGNOSIS — Z1211 Encounter for screening for malignant neoplasm of colon: Secondary | ICD-10-CM

## 2012-05-20 DIAGNOSIS — Z800 Family history of malignant neoplasm of digestive organs: Secondary | ICD-10-CM

## 2012-05-20 DIAGNOSIS — N879 Dysplasia of cervix uteri, unspecified: Secondary | ICD-10-CM

## 2012-05-20 DIAGNOSIS — Z86010 Personal history of colonic polyps: Secondary | ICD-10-CM

## 2012-05-20 DIAGNOSIS — R933 Abnormal findings on diagnostic imaging of other parts of digestive tract: Secondary | ICD-10-CM

## 2012-05-20 DIAGNOSIS — R87619 Unspecified abnormal cytological findings in specimens from cervix uteri: Secondary | ICD-10-CM

## 2012-05-20 MED ORDER — MOVIPREP 100 G PO SOLR: ORAL | Status: DC

## 2012-05-22 ENCOUNTER — Encounter: Payer: Self-pay | Admitting: Internal Medicine

## 2012-05-22 ENCOUNTER — Ambulatory Visit (AMBULATORY_SURGERY_CENTER): Payer: BC Managed Care – PPO | Admitting: Internal Medicine

## 2012-05-22 VITALS — BP 114/67 | HR 54 | Temp 97.6°F | Resp 15 | Ht 64.0 in | Wt 169.0 lb

## 2012-05-22 DIAGNOSIS — D126 Benign neoplasm of colon, unspecified: Secondary | ICD-10-CM

## 2012-05-22 DIAGNOSIS — Z800 Family history of malignant neoplasm of digestive organs: Secondary | ICD-10-CM

## 2012-05-22 DIAGNOSIS — K635 Polyp of colon: Secondary | ICD-10-CM

## 2012-05-22 DIAGNOSIS — Z86010 Personal history of colonic polyps: Secondary | ICD-10-CM

## 2012-05-22 DIAGNOSIS — Z1211 Encounter for screening for malignant neoplasm of colon: Secondary | ICD-10-CM

## 2012-05-22 MED ORDER — SODIUM CHLORIDE 0.9 % IV SOLN: 500.0000 mL | INTRAVENOUS | Status: DC

## 2012-05-22 NOTE — Progress Notes (Addendum)
Patient did not have preoperative order for IV antibiotic SSI prophylaxis. (G8918)  Patient did not experience any of the following events: a burn prior to discharge; a fall within the facility; wrong site/side/patient/procedure/implant event; or a hospital transfer or hospital admission upon discharge from the facility. (G8907)  

## 2012-05-22 NOTE — Op Note (Signed)
[facility] [address] [place], [address]   COLONOSCOPY PROCEDURE REPORT  PATIENT: Abigail Singh, Abigail Singh  MR#: [id number] BIRTHDATE: 1975/07/25 , 36  yrs. old GENDER: Female ENDOSCOPIST: Roxy Cedar, MD REFERRED GE:[id number] Program Recall PROCEDURE DATE:  05/22/2012 PROCEDURE:   Colonoscopy with snare polypectomy    x 5 ASA CLASS:   Class I INDICATIONS:Patient's personal history of adenomatous colon polyps (03-2006 w/ TA) and excision of colonic polyps (BX indeterminite fold 11-2006 w/ SA - needs removed). MEDICATIONS: MAC sedation, administered by CRNA and propofol (Diprivan) 450mg  IV  DESCRIPTION OF PROCEDURE:   After the risks benefits and alternatives of the procedure were thoroughly explained, informed consent was obtained.  A digital rectal exam revealed no abnormalities of the rectum.   The LB CF-[id number]  endoscope was introduced through the anus and advanced to the cecum, which was identified by both the appendix and ileocecal valve. No adverse events experienced.   The quality of the prep was excellent, using MoviPrep  The instrument was then slowly withdrawn as the colon was fully examined.      COLON FINDINGS: A sessile polyp measuring 15 mm in size was found in the PROXIMAL transverse colon adjacent to the previously placed marking tattoo..  A polypectomy was performed using snare cautery. The resection was complete and the polyp tissue was completely retrieved.   Four additional polyps ranging between 5-36mm in size were found in the mid and distal transverse colon (3) and rectum. A polypectomy was performed with a cold snare.  The resection was complete and the polyp tissue was completely retrieved.   The colon mucosa was otherwise normal.  Retroflexed views revealed no abnormalities. The time to cecum=8 minutes 13 seconds.  Withdrawal time=15 minutes 48 seconds.  The scope was withdrawn and the procedure  completed. COMPLICATIONS: There were no complications.  ENDOSCOPIC IMPRESSION: 1.   Sessile polyp measuring 15 mm in size was found in the transverse colon; polypectomy was performed using snare cautery 2.   Four polyps ranging between 5-33mm in size were found in the transverse colon and rectum; polypectomy was performed with a cold snare 3.   The colon mucosa was otherwise normal  RECOMMENDATIONS: 1. Repeat Colonoscopy in 3 years.   eSigned:  Roxy Cedar, MD 05/22/2012 12:16 PM   cc: Sheliah Hatch, MD and The Patient   PATIENT NAME:  Abigail Singh, Abigail Singh MR#: [id number]

## 2012-05-22 NOTE — Progress Notes (Signed)
Called to room to assist during endoscopic procedure.  Patient ID and intended procedure confirmed with present staff. Received instructions for my participation in the procedure from the performing physician.  

## 2012-05-22 NOTE — Patient Instructions (Addendum)
YOU HAD AN ENDOSCOPIC PROCEDURE TODAY AT THE [facility]: Refer to the procedure report that was given to you for any specific questions about what was found during the examination.  If the procedure report does not answer your questions, please call your gastroenterologist to clarify.  If you requested that your care partner not be given the details of your procedure findings, then the procedure report has been included in a sealed envelope for you to review at your convenience later.  YOU SHOULD EXPECT: Some feelings of bloating in the abdomen. Passage of more gas than usual.  Walking can help get rid of the air that was put into your GI tract during the procedure and reduce the bloating. If you had a lower endoscopy (such as a colonoscopy or flexible sigmoidoscopy) you may notice spotting of blood in your stool or on the toilet paper. If you underwent a bowel prep for your procedure, then you may not have a normal bowel movement for a few days.  DIET: Your first meal following the procedure should be a light meal and then it is ok to progress to your normal diet.  A half-sandwich or bowl of soup is an example of a good first meal.  Heavy or fried foods are harder to digest and may make you feel nauseous or bloated.  Likewise meals heavy in dairy and vegetables can cause extra gas to form and this can also increase the bloating.  Drink plenty of fluids but you should avoid alcoholic beverages for 24 hours.  ACTIVITY: Your care partner should take you home directly after the procedure.  You should plan to take it easy, moving slowly for the rest of the day.  You can resume normal activity the day after the procedure however you should NOT DRIVE or use heavy machinery for 24 hours (because of the sedation medicines used during the test).    SYMPTOMS TO REPORT IMMEDIATELY: A gastroenterologist can be reached at any hour.  During normal business hours, 8:30 AM to 5:00 PM Monday through Friday,  call [phone].  After hours and on weekends, please call the GI answering service at [phone] who will take a message and have the physician on call contact you.   Following lower endoscopy (colonoscopy or flexible sigmoidoscopy):  Excessive amounts of blood in the stool  Significant tenderness or worsening of abdominal pains  Swelling of the abdomen that is new, acute  Fever of 100F or higher  DO NOT TAKE ASPIRIN NOR NSAIDS FOR 1 WEEK POST PROCEDURE.   FOLLOW UP: If any biopsies were taken you will be contacted by phone or by letter within the next 1-3 weeks.  Call your gastroenterologist if you have not heard about the biopsies in 3 weeks.  Our staff will call the home number listed on your records the next business day following your procedure to check on you and address any questions or concerns that you may have at that time regarding the information given to you following your procedure. This is a courtesy call and so if there is no answer at the home number and we have not heard from you through the emergency physician on call, we will assume that you have returned to your regular daily activities without incident.  SIGNATURES/CONFIDENTIALITY: You and/or your care partner have signed paperwork which will be entered into your electronic medical record.  These signatures attest to the fact that that the information above on your After Visit Summary has  been reviewed and is understood.  Full responsibility of the confidentiality of this discharge information lies with you and/or your care-partner.   Pump and discard breast milk for 4 hours after the procedure per Cathlyn Parsons, CRNA (per Patient)

## 2012-05-22 NOTE — Progress Notes (Signed)
[facility]VS pt A&O Pleased with MAC. Report to April

## 2012-05-23 ENCOUNTER — Telehealth: Payer: Self-pay | Admitting: *Deleted

## 2012-05-23 NOTE — Telephone Encounter (Signed)
  Follow up Call-  Call back number 05/22/2012 05/07/2011  Post procedure Call Back phone  # [phone] ok to leave a msg  Permission to leave phone message Yes -     Patient questions:  Do you have a fever, pain , or abdominal swelling? yes Pain Score  2 *  Have you tolerated food without any problems? yes  Have you been able to return to your normal activities? yes  Do you have any questions about your discharge instructions: Diet   no Medications  no Follow up visit  no  Do you have questions or concerns about your Care? no  Actions: * If pain score is 4 or above: No action needed, pain <4. Pt did c/o pain left abdomen 2-3 on pain scale that tylenol relieves.pt said she felt like it was because cautery was uses to remove a polyp during colonoscopy. Pt did not feel like this was gas pain or trapped air. Encouraged pt to call us back if this pain gets any worse or does not get better today or if she has any other pain or symptoms.

## 2012-05-23 NOTE — Telephone Encounter (Signed)
Pt states she continues to have abd pain like menstrual cramps. Gives it a 2 on the pain scale.  Tylenol does relieve pain but once it wears off the pain comes back.  Instructed her we would talk with Abigail Singh and give her a call back.  1615 Abigail Frame RN spoke with Abigail Singh and he stated he removed a polyp and used cautery. Please explain to pt that sometimes when using cautery they will have the type of pain she is experiencing and it should resolve in next couple of days.  If pain gets worse or does not resolve please call on call Abigail or call the office.  1620 Spoke with pt and explained to her Abigail Singh instructions.  She verbalized understanding and states she will call if she doesn't get better.

## 2012-05-24 ENCOUNTER — Encounter (HOSPITAL_COMMUNITY)
Admission: RE | Admit: 2012-05-24 | Discharge: 2012-05-24 | Disposition: A | Discharge status: Home / Self Care | Payer: BC Managed Care – PPO | Source: Ambulatory Visit | Attending: Obstetrics and Gynecology | Admitting: Obstetrics and Gynecology

## 2012-05-24 DIAGNOSIS — O923 Agalactia: Secondary | ICD-10-CM | POA: Insufficient documentation

## 2012-05-28 ENCOUNTER — Encounter: Payer: Self-pay | Admitting: Internal Medicine

## 2012-06-03 ENCOUNTER — Telehealth: Payer: Self-pay | Admitting: Internal Medicine

## 2012-06-03 ENCOUNTER — Encounter: Payer: BC Managed Care – PPO | Admitting: Internal Medicine

## 2012-06-03 NOTE — Telephone Encounter (Signed)
Discussed with pt path report and recall date. Let pt know that the letter was mailed out to her yesterday. Pt verbalized understanding.

## 2012-06-24 ENCOUNTER — Encounter (HOSPITAL_COMMUNITY)
Admission: RE | Admit: 2012-06-24 | Discharge: 2012-06-24 | Disposition: A | Discharge status: Home / Self Care | Payer: BC Managed Care – PPO | Source: Ambulatory Visit | Attending: Obstetrics and Gynecology | Admitting: Obstetrics and Gynecology

## 2012-06-24 DIAGNOSIS — O923 Agalactia: Secondary | ICD-10-CM | POA: Insufficient documentation

## 2012-07-17 ENCOUNTER — Ambulatory Visit (INDEPENDENT_AMBULATORY_CARE_PROVIDER_SITE_OTHER): Payer: BC Managed Care – PPO | Admitting: Internal Medicine

## 2012-07-17 ENCOUNTER — Encounter: Payer: Self-pay | Admitting: Internal Medicine

## 2012-07-17 ENCOUNTER — Ambulatory Visit: Payer: BC Managed Care – PPO | Admitting: Family Medicine

## 2012-07-17 VITALS — BP 122/80 | HR 70 | Temp 97.7°F | Wt 173.0 lb

## 2012-07-17 DIAGNOSIS — J029 Acute pharyngitis, unspecified: Secondary | ICD-10-CM

## 2012-07-17 DIAGNOSIS — J209 Acute bronchitis, unspecified: Secondary | ICD-10-CM

## 2012-07-17 LAB — POCT RAPID STREP A (OFFICE): Rapid Strep A Screen: NEGATIVE

## 2012-07-17 MED ORDER — AZITHROMYCIN 250 MG PO TABS: ORAL_TABLET | ORAL | Status: DC

## 2012-07-17 NOTE — Progress Notes (Signed)
Subjective:    Patient ID: Abigail Singh, female    DOB: 26-Nov-1975, 37 y.o.   MRN: [id number]  HPI The respiratory tract symptoms began 2 weeks as rhinitis followed by sore throat,head congestion,  chest congestion,  & cough with thick clear  sputum.  Exposures reported  to sick family & students  Significant active  associated symptoms include nasal purulence & R earache. Cough is not  associated with wheezing  & dyspnea reported.                                                                                                                        Flu shot current   No treatment as breast feeding There is no history of asthma . She has  seasonal  allergies.  The patient had quit smoking in 1999                   Review of Systems  Symptoms not present include frontal headache,  facial pain, dental pain,  &  otic discharge. Fever, chills, sweats were not present . Itchy/  watery eyes &  sneezing were not noted. Myalgias and arthralgias are present as part of fibromyalgia.     Objective:   Physical Exam General appearance:good health ;well nourished; no acute distress or increased work of breathing is present.   Eyes: No conjunctival inflammation or lid edema is present.  Ears:  External ear exam shows no significant lesions or deformities.  Otoscopic examination reveals clear canals, tympanic membranes are intact bilaterally without bulging, retraction, inflammation or discharge. Nose:  External nasal examination shows no deformity or inflammation. Nasal mucosa are pink and moist without lesions or exudates. No septal dislocation or deviation.No obstruction to airflow.  Hyponasal speech Oral exam: Dental hygiene is good; lips and gums are healthy appearing.There is no oropharyngeal erythema or exudate noted.  Neck:  No deformities, masses, or tenderness noted.     Heart:  Normal rate and regular rhythm. S1 and S2 normal without gallop, click, rub or murmur.  S4  with slurring Lungs:Chest clear to auscultation; no wheezes, rhonchi,rales ,or rubs present.No increased work of breathing.   Extremities:  No cyanosis, edema, or clubbing  noted  L cervical  lymphadenopathy ; none in  axilla .  Skin: Warm & dry                                                                                                                 Assessment & Plan:  #  1 acute bronchitis w/o bronchospasm #2 URI, acute Plan: See orders and recommendations

## 2012-07-17 NOTE — Patient Instructions (Addendum)
Plain Mucinex (NOT D) for thick secretions ;force NON dairy fluids .   Nasal cleansing in the shower as discussed with lather of mild shampoo.After 10 seconds wash off lather while  exhaling through nostrils. Make sure that all residual soap is removed to prevent irritation.  Nasonex 1 spray in each nostril twice a day as needed. Use the "crossover" technique into opposite nostril spraying toward opposite ear @ 45 degree angle, not straight up into nostril.  Use a Neti pot daily only  as needed for significant sinus congestion; going from open side to congested side . Plain Allegra (NOT D )  160 daily , Loratidine 10 mg , OR Zyrtec 10 mg @ bedtime  as needed for itchy eyes & sneezing.    

## 2012-07-23 ENCOUNTER — Encounter (HOSPITAL_COMMUNITY)
Admission: RE | Admit: 2012-07-23 | Discharge: 2012-07-23 | Disposition: A | Discharge status: Home / Self Care | Payer: BC Managed Care – PPO | Source: Ambulatory Visit | Attending: Obstetrics and Gynecology | Admitting: Obstetrics and Gynecology

## 2012-07-23 DIAGNOSIS — O923 Agalactia: Secondary | ICD-10-CM | POA: Insufficient documentation

## 2012-08-23 ENCOUNTER — Encounter (HOSPITAL_COMMUNITY)
Admission: RE | Admit: 2012-08-23 | Discharge: 2012-08-23 | Disposition: A | Discharge status: Home / Self Care | Payer: BC Managed Care – PPO | Source: Ambulatory Visit | Attending: Obstetrics and Gynecology | Admitting: Obstetrics and Gynecology

## 2012-08-23 DIAGNOSIS — O923 Agalactia: Secondary | ICD-10-CM | POA: Insufficient documentation

## 2012-09-23 ENCOUNTER — Encounter (HOSPITAL_COMMUNITY)
Admission: RE | Admit: 2012-09-23 | Discharge: 2012-09-23 | Disposition: A | Discharge status: Home / Self Care | Payer: BC Managed Care – PPO | Source: Ambulatory Visit | Attending: Obstetrics and Gynecology | Admitting: Obstetrics and Gynecology

## 2012-09-23 DIAGNOSIS — O923 Agalactia: Secondary | ICD-10-CM | POA: Insufficient documentation

## 2012-10-24 ENCOUNTER — Encounter (HOSPITAL_COMMUNITY)
Admission: RE | Admit: 2012-10-24 | Discharge: 2012-10-24 | Disposition: A | Discharge status: Home / Self Care | Payer: BC Managed Care – PPO | Source: Ambulatory Visit | Attending: Obstetrics and Gynecology | Admitting: Obstetrics and Gynecology

## 2012-10-24 DIAGNOSIS — O923 Agalactia: Secondary | ICD-10-CM | POA: Insufficient documentation

## 2012-10-30 LAB — HM PAP SMEAR: HM Pap smear: NORMAL

## 2012-11-13 ENCOUNTER — Telehealth: Payer: Self-pay | Admitting: *Deleted

## 2012-11-13 NOTE — Telephone Encounter (Signed)
Pt left VM that her daughter has thrush and she is currently breast feeding. Pt spoke with her daughter pediatric and they recommend Rx for diflucan which is used to treat a yeast infection.Marland KitchenMarland KitchenPlease advise if willing to Rx med

## 2012-11-13 NOTE — Telephone Encounter (Signed)
Patient states that her infant was diagnosed with Thrush and the ped provider advised mom to be treated as well. She states that she has yeast on her nipple. Per Dr. Beverely Low we will treat with Diflucan 200 x 1 then one tab for 14 days. Sent prescription to [facility] on [address]. GF/RN

## 2012-11-13 NOTE — Telephone Encounter (Signed)
Is med for pt or for the daughter?  We only treat mom if there is breast pain/soreness.

## 2012-11-23 ENCOUNTER — Encounter (HOSPITAL_COMMUNITY)
Admission: RE | Admit: 2012-11-23 | Discharge: 2012-11-23 | Disposition: A | Discharge status: Home / Self Care | Payer: BC Managed Care – PPO | Source: Ambulatory Visit | Attending: Obstetrics and Gynecology | Admitting: Obstetrics and Gynecology

## 2012-11-23 DIAGNOSIS — O923 Agalactia: Secondary | ICD-10-CM | POA: Insufficient documentation

## 2012-12-01 ENCOUNTER — Ambulatory Visit (INDEPENDENT_AMBULATORY_CARE_PROVIDER_SITE_OTHER): Payer: BC Managed Care – PPO | Admitting: Family Medicine

## 2012-12-01 ENCOUNTER — Encounter: Payer: Self-pay | Admitting: Family Medicine

## 2012-12-01 VITALS — BP 90/60 | HR 81 | Temp 98.2°F | Ht 63.75 in | Wt 155.8 lb

## 2012-12-01 DIAGNOSIS — R350 Frequency of micturition: Secondary | ICD-10-CM

## 2012-12-01 DIAGNOSIS — R300 Dysuria: Secondary | ICD-10-CM

## 2012-12-01 LAB — POCT URINALYSIS DIPSTICK
Spec Grav, UA: 1.015
Urobilinogen, UA: 0.2

## 2012-12-01 MED ORDER — NITROFURANTOIN MONOHYD MACRO 100 MG PO CAPS: 100.0000 mg | ORAL_CAPSULE | Freq: Two times a day (BID) | ORAL | Status: AC

## 2012-12-01 NOTE — Progress Notes (Signed)
  Subjective:    Patient ID: Abigail Singh, female    DOB: 1976/02/19, 37 y.o.   MRN: [id number]  HPI UTI- sxs started Friday night w/ frequency.  Urgency, hesitancy, incomplete emptying, suprapubic pressure.  No fevers.  LBP began this afternoon.  No blood in urine.   Review of Systems For ROS see HPI     Objective:   Physical Exam  Vitals reviewed. Constitutional: She appears well-developed and well-nourished. No distress.  Abdominal: Soft. She exhibits no distension. There is no tenderness (no suprapubic or CVA tenderness).          Assessment & Plan:

## 2012-12-01 NOTE — Assessment & Plan Note (Signed)
Pt's sxs and UA consistent w/ UTI.  Start Macrobid as pt is breast feeding.  Reviewed supportive care and red flags that should prompt return.  Pt expressed understanding and is in agreement w/ plan.

## 2012-12-01 NOTE — Patient Instructions (Addendum)
This appears to be a UTI Start the Macrobid twice daily Drink plenty of fluids Call with any questions or concerns Hang in there!!!

## 2012-12-04 LAB — URINE CULTURE

## 2012-12-05 ENCOUNTER — Telehealth: Payer: Self-pay | Admitting: *Deleted

## 2012-12-05 MED ORDER — CIPROFLOXACIN HCL 500 MG PO TABS: 500.0000 mg | ORAL_TABLET | Freq: Two times a day (BID) | ORAL | Status: DC

## 2012-12-05 NOTE — Telephone Encounter (Signed)
Message copied by Verdie Shire on Fri Dec 05, 2012  8:43 AM ------      Message from: Sheliah Hatch      Created: Thu Dec 04, 2012  8:08 AM       Pt's UTI is resistant to Macrobid.  Needs to switch to Cipro 500mg  BID x3 days (safe in breast feeding) ------

## 2012-12-05 NOTE — Telephone Encounter (Signed)
Spoke with the pt and informed her of recent UC results and note.   Pt understood and agreed.  New rx sent to the pharmacy by e-script.//AB/CMA

## 2012-12-24 ENCOUNTER — Encounter (HOSPITAL_COMMUNITY)
Admission: RE | Admit: 2012-12-24 | Discharge: 2012-12-24 | Disposition: A | Discharge status: Home / Self Care | Payer: BC Managed Care – PPO | Source: Ambulatory Visit | Attending: Obstetrics and Gynecology | Admitting: Obstetrics and Gynecology

## 2012-12-24 DIAGNOSIS — O923 Agalactia: Secondary | ICD-10-CM | POA: Insufficient documentation

## 2013-01-24 ENCOUNTER — Encounter (HOSPITAL_COMMUNITY)
Admission: RE | Admit: 2013-01-24 | Discharge: 2013-01-24 | Disposition: A | Discharge status: Home / Self Care | Payer: BC Managed Care – PPO | Source: Ambulatory Visit | Attending: Obstetrics and Gynecology | Admitting: Obstetrics and Gynecology

## 2013-01-24 DIAGNOSIS — O923 Agalactia: Secondary | ICD-10-CM | POA: Insufficient documentation

## 2013-02-24 ENCOUNTER — Encounter (HOSPITAL_COMMUNITY)
Admission: RE | Admit: 2013-02-24 | Discharge: 2013-02-24 | Disposition: A | Discharge status: Home / Self Care | Payer: BC Managed Care – PPO | Source: Ambulatory Visit | Attending: Obstetrics and Gynecology | Admitting: Obstetrics and Gynecology

## 2013-02-24 ENCOUNTER — Encounter (HOSPITAL_COMMUNITY): Payer: BC Managed Care – PPO

## 2013-02-24 DIAGNOSIS — O923 Agalactia: Secondary | ICD-10-CM | POA: Insufficient documentation

## 2013-03-03 ENCOUNTER — Encounter: Payer: Self-pay | Admitting: Family Medicine

## 2013-03-03 ENCOUNTER — Ambulatory Visit (INDEPENDENT_AMBULATORY_CARE_PROVIDER_SITE_OTHER): Payer: BC Managed Care – PPO | Admitting: Family Medicine

## 2013-03-03 VITALS — BP 104/62 | HR 87 | Temp 98.3°F | Wt 150.2 lb

## 2013-03-03 DIAGNOSIS — J019 Acute sinusitis, unspecified: Secondary | ICD-10-CM

## 2013-03-03 MED ORDER — BUDESONIDE 32 MCG/ACT NA SUSP: NASAL | Status: DC

## 2013-03-03 MED ORDER — AZITHROMYCIN 250 MG PO TABS: ORAL_TABLET | ORAL | Status: DC

## 2013-03-03 NOTE — Patient Instructions (Signed)

## 2013-03-03 NOTE — Progress Notes (Signed)
  Subjective:     Abigail Singh is a 37 y.o. female who presents for evaluation of sinus pain. Symptoms include: congestion, cough, facial pain, itchy eyes, nasal congestion, purulent rhinorrhea, sinus pressure and sore throat. Onset of symptoms was 7 days ago. Symptoms have been gradually worsening since that time. Past history is significant for no history of pneumonia or bronchitis. Patient is a non-smoker.  Pt is breast feeding.   The following portions of the patient's history were reviewed and updated as appropriate: allergies, current medications, past family history, past medical history, past social history, past surgical history and problem list.  Review of Systems Pertinent items are noted in HPI.   Objective:    BP 104/62  Pulse 87  Temp(Src) 98.3 F (36.8 C) (Oral)  Wt 150 lb 3.2 oz (68.13 kg)  BMI 25.99 kg/m2  SpO2 97% General appearance: alert, cooperative, appears stated age and no distress Ears: normal TM's and external ear canals both ears Nose: green discharge, moderate congestion, turbinates red, swollen, sinus tenderness bilateral Throat: lips, mucosa, and tongue normal; teeth and gums normal Neck: moderate anterior cervical adenopathy, no JVD and thyroid not enlarged, symmetric, no tenderness/mass/nodules Lungs: clear to auscultation bilaterally Heart: S1, S2 normal    Assessment:    Acute bacterial sinusitis.    Plan:    Neti pot recommended. Instructions given. Nasal steroids per medication orders. Antihistamines per medication orders. Zithromax per medication orders.

## 2013-03-12 ENCOUNTER — Other Ambulatory Visit: Payer: Self-pay

## 2013-03-27 ENCOUNTER — Encounter (HOSPITAL_COMMUNITY)
Admission: RE | Admit: 2013-03-27 | Discharge: 2013-03-27 | Disposition: A | Discharge status: Home / Self Care | Payer: BC Managed Care – PPO | Source: Ambulatory Visit | Attending: Obstetrics and Gynecology | Admitting: Obstetrics and Gynecology

## 2013-03-27 DIAGNOSIS — O923 Agalactia: Secondary | ICD-10-CM | POA: Insufficient documentation

## 2013-07-17 ENCOUNTER — Encounter: Payer: Self-pay | Admitting: Physician Assistant

## 2013-07-17 ENCOUNTER — Ambulatory Visit (INDEPENDENT_AMBULATORY_CARE_PROVIDER_SITE_OTHER): Payer: BC Managed Care – PPO | Admitting: Physician Assistant

## 2013-07-17 VITALS — BP 112/76 | HR 68 | Temp 97.6°F | Resp 16 | Ht 63.5 in | Wt 165.4 lb

## 2013-07-17 DIAGNOSIS — H6690 Otitis media, unspecified, unspecified ear: Secondary | ICD-10-CM

## 2013-07-17 DIAGNOSIS — J40 Bronchitis, not specified as acute or chronic: Secondary | ICD-10-CM

## 2013-07-17 MED ORDER — AZITHROMYCIN 250 MG PO TABS: ORAL_TABLET | ORAL | Status: DC

## 2013-07-17 NOTE — Patient Instructions (Signed)
Take antibiotic as prescribed with food.  Take a probiotic daily.  Continue nasal spray.  Take a daily claritin.  Mucinex if needed.  Place a humidifier in the bedroom.  Call or return to clinic if symptoms are not improving.  Otitis Media, Adult Otitis media is redness, soreness, and swelling (inflammation) of the middle ear. Otitis media may be caused by allergies or, most commonly, by infection. Often it occurs as a complication of the common cold. SIGNS AND SYMPTOMS Symptoms of otitis media may include:  Earache.  Fever.  Ringing in your ear.  Headache.  Leakage of fluid from the ear. DIAGNOSIS To diagnose otitis media, your health care provider will examine your ear with an otoscope. This is an instrument that allows your health care provider to see into your ear in order to examine your eardrum. Your health care provider also will ask you questions about your symptoms. TREATMENT  Typically, otitis media resolves on its own within 3 5 days. Your health care provider may prescribe medicine to ease your symptoms of pain. If otitis media does not resolve within 5 days or is recurrent, your health care provider may prescribe antibiotic medicines if he or she suspects that a bacterial infection is the cause. HOME CARE INSTRUCTIONS   Take your medicine as directed until it is gone, even if you feel better after the first few days.  Only take over-the-counter or prescription medicines for pain, discomfort, or fever as directed by your health care provider.  Follow up with your health care provider as directed. SEEK MEDICAL CARE IF:  You have otitis media only in one ear or bleeding from your nose or both.  You notice a lump on your neck.  You are not getting better in 3 5 days.  You feel worse instead of better. SEEK IMMEDIATE MEDICAL CARE IF:   You have pain that is not controlled with medicine.  You have swelling, redness, or pain around your ear or stiffness in your  neck.  You notice that part of your face is paralyzed.  You notice that the bone behind your ear (mastoid) is tender when you touch it. MAKE SURE YOU:   Understand these instructions.  Will watch your condition.  Will get help right away if you are not doing well or get worse. Document Released: 01/27/2004 Document Revised: 02/11/2013 Document Reviewed: 11/18/2012 [facility] Patient Information 2014 [place], [place].  Acute Bronchitis Bronchitis is inflammation of the airways that extend from the windpipe into the lungs (bronchi). The inflammation often causes mucus to develop. This leads to a cough, which is the most common symptom of bronchitis.  In acute bronchitis, the condition usually develops suddenly and goes away over time, usually in a couple weeks. Smoking, allergies, and asthma can make bronchitis worse. Repeated episodes of bronchitis may cause further lung problems.  CAUSES Acute bronchitis is most often caused by the same virus that causes a cold. The virus can spread from person to person (contagious).  SIGNS AND SYMPTOMS   Cough.   Fever.   Coughing up mucus.   Body aches.   Chest congestion.   Chills.   Shortness of breath.   Sore throat.  DIAGNOSIS  Acute bronchitis is usually diagnosed through a physical exam. Tests, such as chest X-rays, are sometimes done to rule out other conditions.  TREATMENT  Acute bronchitis usually goes away in a couple weeks. Often times, no medical treatment is necessary. Medicines are sometimes given for relief of fever or  cough. Antibiotics are usually not needed but may be prescribed in certain situations. In some cases, an inhaler may be recommended to help reduce shortness of breath and control the cough. A cool mist vaporizer may also be used to help thin bronchial secretions and make it easier to clear the chest.  HOME CARE INSTRUCTIONS  Get plenty of rest.   Drink enough fluids to keep your urine clear or pale  yellow (unless you have a medical condition that requires fluid restriction). Increasing fluids may help thin your secretions and will prevent dehydration.   Only take over-the-counter or prescription medicines as directed by your health care provider.   Avoid smoking and secondhand smoke. Exposure to cigarette smoke or irritating chemicals will make bronchitis worse. If you are a smoker, consider using nicotine gum or skin patches to help control withdrawal symptoms. Quitting smoking will help your lungs heal faster.   Reduce the chances of another bout of acute bronchitis by washing your hands frequently, avoiding people with cold symptoms, and trying not to touch your hands to your mouth, nose, or eyes.   Follow up with your health care provider as directed.  SEEK MEDICAL CARE IF: Your symptoms do not improve after 1 week of treatment.  SEEK IMMEDIATE MEDICAL CARE IF:  You develop an increased fever or chills.   You have chest pain.   You have severe shortness of breath.  You have bloody sputum.   You develop dehydration.  You develop fainting.  You develop repeated vomiting.  You develop a severe headache. MAKE SURE YOU:   Understand these instructions.  Will watch your condition.  Will get help right away if you are not doing well or get worse. Document Released: 05/31/2004 Document Revised: 12/24/2012 Document Reviewed: 10/14/2012 [facility] Patient Information 2014 [place].

## 2013-07-17 NOTE — Progress Notes (Signed)
Patient presents to clinic today c/o one-week of sinus pressure, sinus pain, chest congestion, productive cough and bilateral ear pain. Pain is greatest in right ear. Denies tinnitus or change in hearing. Patient endorses fever at onset of symptoms. Denies recent travel. Denies history of asthma. Patient does experience ear infections occasionally.  Past Medical History  Diagnosis Date  . Fibromyalgia   . Migraines   . History of fibromyalgia   . Preterm labor   . Urinary tract infection   . Fibroid   . Diabetes mellitus     gest. DM  . Heartburn in pregnancy   . Anemia     as teenager  . Status post cesarean delivery (repeat x 2) 03/17/2012  . Postpartum care following cesarean delivery 03/17/2012    Current Outpatient Prescriptions on File Prior to Visit  Medication Sig Dispense Refill  . acetaminophen (TYLENOL) 325 MG tablet Take 650 mg by mouth every 6 (six) hours as needed.       No current facility-administered medications on file prior to visit.    Allergies  Allergen Reactions  . Aspirin Anaphylaxis    Pt reports being able to take ibuprofen with no problems.  . Codeine Other (See Comments)    halucinations  . Erythromycin Rash  . Penicillins Rash  . Sulfonamide Derivatives Rash    Childhood reaction, has taken recently with only stomach irritation.    Family History  Problem Relation Age of Onset  . Heart failure      Grandfather  . Heart attack      x's 4 Grandfather  . Stroke      x's 1 Grandfather  . Diabetes    . Diabetes Mother   . Diabetes Father   . Diabetes Maternal Aunt   . Diabetes Maternal Grandmother   . Colon polyps Maternal Grandmother   . Diabetes Maternal Grandfather   . Colon polyps Maternal Grandfather   . Anesthesia problems Neg Hx     History   Social History  . Marital Status: Married    Spouse Name: N/A    Number of Children: N/A  . Years of Education: N/A   Social History Main Topics  . Smoking status: Former Smoker   Types: Cigarettes    Quit date: 05/07/1997  . Smokeless tobacco: Never Used  . Alcohol Use: No  . Drug Use: No  . Sexual Activity: Yes   Other Topics Concern  . None   Social History Narrative  . None   Review of Systems - See HPI.  All other ROS are negative.  BP 112/76  Pulse 68  Temp(Src) 97.6 F (36.4 C) (Oral)  Resp 16  Ht 5' 3.5" (1.613 m)  Wt 165 lb 6 oz (75.014 kg)  BMI 28.83 kg/m2  SpO2 100%  LMP 06/21/2013  Physical Exam  Vitals reviewed. Constitutional: She is oriented to person, place, and time and well-developed, well-nourished, and in no distress.  HENT:  Head: Normocephalic and atraumatic.  Right Ear: External ear and ear canal normal. Tympanic membrane is erythematous and bulging.  Left Ear: Tympanic membrane, external ear and ear canal normal.  Nose: Right sinus exhibits frontal sinus tenderness. Right sinus exhibits no maxillary sinus tenderness. Left sinus exhibits frontal sinus tenderness. Left sinus exhibits no maxillary sinus tenderness.  Mouth/Throat: Uvula is midline, oropharynx is clear and moist and mucous membranes are normal.  Eyes: Conjunctivae are normal. Pupils are equal, round, and reactive to light.  Neck: Neck supple.  Cardiovascular: Normal  rate, regular rhythm and normal heart sounds.   Pulmonary/Chest: Effort normal and breath sounds normal.  Lymphadenopathy:    She has no cervical adenopathy.  Neurological: She is alert and oriented to person, place, and time.  Skin: Skin is warm and dry. No rash noted.  Psychiatric: Affect normal.    No results found for this or any previous visit (from the past 2160 hour(s)).  Assessment/Plan: Otitis media With bronchitis.  Rx Azithromycin.  Increase fluids.  Rest.  Saline nasal spray.  Probiotic.  Mucinex.  Humidifier in bedroom.

## 2013-07-17 NOTE — Progress Notes (Signed)
Pre visit review using our clinic review tool, if applicable. No additional management support is needed unless otherwise documented below in the visit note/SLS  

## 2013-07-19 DIAGNOSIS — H6690 Otitis media, unspecified, unspecified ear: Secondary | ICD-10-CM | POA: Insufficient documentation

## 2013-07-19 NOTE — Assessment & Plan Note (Signed)
With bronchitis.  Rx Azithromycin.  Increase fluids.  Rest.  Saline nasal spray.  Probiotic.  Mucinex.  Humidifier in bedroom.

## 2013-09-21 ENCOUNTER — Encounter: Payer: Self-pay | Admitting: Internal Medicine

## 2013-09-21 ENCOUNTER — Telehealth: Payer: Self-pay

## 2013-09-21 ENCOUNTER — Ambulatory Visit (INDEPENDENT_AMBULATORY_CARE_PROVIDER_SITE_OTHER): Payer: BC Managed Care – PPO | Admitting: Internal Medicine

## 2013-09-21 VITALS — BP 106/72 | HR 65 | Temp 98.5°F | Wt 170.0 lb

## 2013-09-21 DIAGNOSIS — K115 Sialolithiasis: Secondary | ICD-10-CM

## 2013-09-21 DIAGNOSIS — E041 Nontoxic single thyroid nodule: Secondary | ICD-10-CM

## 2013-09-21 NOTE — Patient Instructions (Signed)
PLEASE SCHEDULE A PHYSICAL WITH YOUR PCP WILL SCHEDULE A ULTRASOUND

## 2013-09-21 NOTE — Progress Notes (Signed)
Subjective:    Patient ID: Abigail Singh, female    DOB: 1975/07/01, 38 y.o.   MRN: [id number]  DOS:  09/21/2013 Type of  visit: Acute visit 3 months ago, her right jaw was swollen, at the time she was taken antibiotics for a sinus infection in the symptoms resolved within 2 weeks. 3 days ago noted swelling area on the left side of the neck, it got better after a massage, is still slightly tender.   Also concerned about her history of a thyroid nodule  ROS Denies fever or chills No sinus pain or congestion No dental pain per se  Past Medical History  Diagnosis Date  . Fibromyalgia   . Migraines   . History of fibromyalgia   . Preterm labor   . Urinary tract infection   . Fibroid   . Diabetes mellitus     gest. DM  . Heartburn in pregnancy   . Anemia     as teenager  . Status post cesarean delivery (repeat x 2) 03/17/2012  . Postpartum care following cesarean delivery 03/17/2012    Past Surgical History  Procedure Laterality Date  . Cesarean section    . Dilation and curettage of uterus    . Wisdom tooth extraction    . Polypectomy    . Colonoscopy    . Tubes in ears    . Cesarean section  03/17/2012    Procedure: CESAREAN SECTION;  Surgeon: Marvene Staff, MD;  Location: [facility];  Service: Obstetrics;  Laterality: N/A;  Repeat C/S.  EDD: 03/23/12  . Abnormal cells in colon/tatoo area      2012    History   Social History  . Marital Status: Married    Spouse Name: N/A    Number of Children: N/A  . Years of Education: N/A   Occupational History  . Not on file.   Social History Main Topics  . Smoking status: Former Smoker    Types: Cigarettes    Quit date: 05/07/1997  . Smokeless tobacco: Never Used  . Alcohol Use: No  . Drug Use: No  . Sexual Activity: Yes   Other Topics Concern  . Not on file   Social History Narrative  . No narrative on file        Medication List       This list is accurate as of: 09/21/13 11:59 PM.  Always use  your most recent med list.               acetaminophen 325 MG tablet  Commonly known as:  TYLENOL  Take 650 mg by mouth every 6 (six) hours as needed.     multivitamin-prenatal 27-0.8 MG Tabs tablet  Take 1 tablet by mouth daily at 12 noon.           Objective:   Physical Exam  Neck:     BP 106/72  Pulse 65  Temp(Src) 98.5 F (36.9 C) (Oral)  Wt 170 lb (77.111 kg)  SpO2 96%  LMP 09/19/2013 General -- alert, well-developed, NAD.  Neck --no thyromegaly , no mass Palpation of the mouth showed a normal floor, mild tenderness by the gum of the L molars, no obvious abscess. HEENT-- Not pale.  Neurologic--  alert & oriented X3. Speech normal, gait normal, strength normal in all extremities.  Psych-- Cognition and judgment appear intact. Cooperative with normal attention span and concentration. No anxious or depressed appearing.        Assessment &  Plan:   Thyroid nodule, 6 mm thyroid nodule by ultrasound 2011, patient is concerned, exam today is benign. Plan: Repeat for ultrasound  Salivary stones? On and off inflammation on the neck could be LADs vs salivary stones, history suggests rather stones. Plan:  Proced w/ a neck ultrasound which will also give [place] information about the gland in the neck. Followup with PCP She does have some tenderness at the gum, that is a dental issue----> recommend to see her dentist

## 2013-09-21 NOTE — Telephone Encounter (Signed)
Spoke with patient who states that the "sore" in her mouth is painful but not obstructing the airway. Has an 11am appt today. Gave precautions for obstructive airway. Patient verbalizes understanding.

## 2013-09-24 ENCOUNTER — Ambulatory Visit
Admission: RE | Admit: 2013-09-24 | Discharge: 2013-09-24 | Disposition: A | Discharge status: Home / Self Care | Payer: BC Managed Care – PPO | Source: Ambulatory Visit | Attending: Internal Medicine | Admitting: Internal Medicine

## 2013-09-24 DIAGNOSIS — E041 Nontoxic single thyroid nodule: Secondary | ICD-10-CM

## 2013-10-21 ENCOUNTER — Ambulatory Visit (INDEPENDENT_AMBULATORY_CARE_PROVIDER_SITE_OTHER): Payer: BC Managed Care – PPO | Admitting: Family Medicine

## 2013-10-21 ENCOUNTER — Encounter: Payer: Self-pay | Admitting: Family Medicine

## 2013-10-21 ENCOUNTER — Other Ambulatory Visit: Payer: Self-pay | Admitting: Family Medicine

## 2013-10-21 VITALS — BP 102/68 | HR 91 | Temp 97.9°F | Resp 16 | Wt 169.5 lb

## 2013-10-21 DIAGNOSIS — R599 Enlarged lymph nodes, unspecified: Secondary | ICD-10-CM

## 2013-10-21 DIAGNOSIS — Z0000 Encounter for general adult medical examination without abnormal findings: Secondary | ICD-10-CM

## 2013-10-21 DIAGNOSIS — K1120 Sialoadenitis, unspecified: Secondary | ICD-10-CM

## 2013-10-21 DIAGNOSIS — R590 Localized enlarged lymph nodes: Secondary | ICD-10-CM

## 2013-10-21 LAB — CBC WITH DIFFERENTIAL/PLATELET
Basophils Absolute: 0 10*3/uL (ref 0.0–0.1)
Basophils Relative: 0.3 % (ref 0.0–3.0)
EOS ABS: 0.1 10*3/uL (ref 0.0–0.7)
EOS PCT: 0.6 % (ref 0.0–5.0)
HCT: 37.3 % (ref 36.0–46.0)
HEMOGLOBIN: 12.5 g/dL (ref 12.0–15.0)
Lymphocytes Relative: 13.1 % (ref 12.0–46.0)
Lymphs Abs: 1.5 10*3/uL (ref 0.7–4.0)
MCHC: 33.6 g/dL (ref 30.0–36.0)
MCV: 88.7 fl (ref 78.0–100.0)
Monocytes Absolute: 0.9 10*3/uL (ref 0.1–1.0)
Monocytes Relative: 8.1 % (ref 3.0–12.0)
NEUTROS ABS: 8.8 10*3/uL — AB (ref 1.4–7.7)
Neutrophils Relative %: 77.9 % — ABNORMAL HIGH (ref 43.0–77.0)
Platelets: 178 10*3/uL (ref 150.0–400.0)
RBC: 4.2 Mil/uL (ref 3.87–5.11)
RDW: 13.6 % (ref 11.5–15.5)
WBC: 11.4 10*3/uL — AB (ref 4.0–10.5)

## 2013-10-21 LAB — HEPATIC FUNCTION PANEL
ALT: 10 U/L (ref 0–35)
AST: 16 U/L (ref 0–37)
Albumin: 3.8 g/dL (ref 3.5–5.2)
Alkaline Phosphatase: 62 U/L (ref 39–117)
BILIRUBIN DIRECT: 0 mg/dL (ref 0.0–0.3)
BILIRUBIN TOTAL: 0.2 mg/dL (ref 0.2–1.2)
Total Protein: 7.3 g/dL (ref 6.0–8.3)

## 2013-10-21 LAB — LIPID PANEL
CHOL/HDL RATIO: 3
CHOLESTEROL: 141 mg/dL (ref 0–200)
HDL: 41.6 mg/dL (ref >39.00)
LDL Cholesterol: 79 mg/dL (ref 0–99)
NonHDL: 99.4
Triglycerides: 100 mg/dL (ref 0.0–149.0)
VLDL: 20 mg/dL (ref 0.0–40.0)

## 2013-10-21 LAB — BASIC METABOLIC PANEL
BUN: 9 mg/dL (ref 6–23)
CALCIUM: 8.6 mg/dL (ref 8.4–10.5)
CO2: 27 mEq/L (ref 19–32)
CREATININE: 0.6 mg/dL (ref 0.4–1.2)
Chloride: 103 mEq/L (ref 96–112)
GFR: 114.64 mL/min (ref >60.00)
Glucose, Bld: 86 mg/dL (ref 70–99)
POTASSIUM: 3.7 meq/L (ref 3.5–5.1)
Sodium: 136 mEq/L (ref 135–145)

## 2013-10-21 LAB — TSH: TSH: 1.06 u[IU]/mL (ref 0.35–4.50)

## 2013-10-21 LAB — VITAMIN D 25 HYDROXY (VIT D DEFICIENCY, FRACTURES): VITD: 17.54 ng/mL

## 2013-10-21 MED ORDER — VITAMIN D (ERGOCALCIFEROL) 1.25 MG (50000 UNIT) PO CAPS: 50000.0000 [IU] | ORAL_CAPSULE | ORAL | Status: DC

## 2013-10-21 NOTE — Assessment & Plan Note (Signed)
New.  No clear bacterial etiology- may be viral illness.  Check CBC.  Reviewed supportive care and red flags that should prompt return.  Pt expressed understanding and is in agreement w/ plan.

## 2013-10-21 NOTE — Patient Instructions (Signed)
Follow up as scheduled for your physical We'll call you with your ENT appt Continue to drink plenty of fluids Tylenol/ibuprofen as needed for pain/inflammation REST when able! We'll notify you of your lab results and make any changes if needed Call with any questions or concerns Hang in there!

## 2013-10-21 NOTE — Progress Notes (Signed)
   Subjective:    Patient ID: Abigail Singh, female    DOB: Aug 19, 1975, 38 y.o.   MRN: [id number]  HPI Pt had appt w/ Dr Larose Kells last month due to painful neck swelling both inside mouth and under jaw bilaterally.  Had normal thyroid US.  Sxs improved between visits but then pain returned on Monday.  Pain caused pt to have fibro flare.  Was having severe pain w/ swallowing.  sxs have improved but continues to have L sided tenderness and sensation of something stuck in throat.  This Monday pt had cold chills and fever- 'like i had the flu'.  Pt reports that's typical of her fibro flares.   Review of Systems For ROS see HPI     Objective:   Physical Exam  Vitals reviewed. Constitutional: She appears well-developed and well-nourished. No distress.  HENT:  Head: Normocephalic and atraumatic.  Nose: Nose normal.  Mouth/Throat: Oropharynx is clear and moist. No oropharyngeal exudate.  TMs WNL bilaterally No TTP over sinuses  Neck: Normal range of motion. Neck supple. No thyromegaly present.  Lymphadenopathy:    She has cervical adenopathy (L anterior cervical node, ~1 cm, mildly TTP).          Assessment & Plan:

## 2013-10-21 NOTE — Progress Notes (Signed)
Pre visit review using our clinic review tool, if applicable. No additional management support is needed unless otherwise documented below in the visit note. 

## 2013-10-21 NOTE — Assessment & Plan Note (Signed)
Will check labs for upcoming CPE

## 2013-10-21 NOTE — Assessment & Plan Note (Signed)
New.  Pt reports at least 2, maybe 3 episodes since March.  + family hx of recurring salivary stones.  Will refer to ENT for complete evaluation and tx.  Pt expressed understanding and is in agreement w/ plan.

## 2013-10-29 ENCOUNTER — Telehealth: Payer: Self-pay

## 2013-10-29 NOTE — Telephone Encounter (Signed)
Left message for call back Non-identifiable   Pap- 10/20/09- negative CCS- 05/22/12- multiple polpys--Dr. Perry--recommended to repeat in 3 years (05/2015) Td- 10/31/1993- OVERDUE

## 2013-10-30 ENCOUNTER — Ambulatory Visit (INDEPENDENT_AMBULATORY_CARE_PROVIDER_SITE_OTHER): Payer: BC Managed Care – PPO | Admitting: Family Medicine

## 2013-10-30 ENCOUNTER — Encounter: Payer: Self-pay | Admitting: Family Medicine

## 2013-10-30 VITALS — BP 104/78 | HR 76 | Temp 98.4°F | Resp 16 | Ht 64.25 in | Wt 167.1 lb

## 2013-10-30 DIAGNOSIS — Z0000 Encounter for general adult medical examination without abnormal findings: Secondary | ICD-10-CM

## 2013-10-30 NOTE — Patient Instructions (Signed)
Follow up in 1 year or as needed Keep up the good work!!  You can do this! Try and schedule your exercise- and involve the girls! Your labs look great! Make sure you have a mid morning and mid afternoon snack to stabilize your blood sugar Call with any questions or concerns Have a great summer!!!

## 2013-10-30 NOTE — Progress Notes (Signed)
   Subjective:    Patient ID: Abigail Singh, female    DOB: [birth year], 38 y.o.   MRN: [id number]  HPI CPE- UTD on GYN.  Had colonoscopy 2014- due for repeat in 3 yrs.   Review of Systems Patient reports no vision/ hearing changes, adenopathy,fever, weight change,  persistant/recurrent hoarseness , swallowing issues, chest pain, palpitations, edema, persistant/recurrent cough, hemoptysis, dyspnea (rest/exertional/paroxysmal nocturnal), gastrointestinal bleeding (melena, rectal bleeding), abdominal pain, significant heartburn, bowel changes, GU symptoms (dysuria, hematuria, incontinence), Gyn symptoms (abnormal  bleeding, pain),  syncope, focal weakness, memory loss, numbness & tingling, skin/hair/nail changes, abnormal bruising or bleeding, anxiety, or depression.     Objective:   Physical Exam General Appearance:    Alert, cooperative, no distress, appears stated age  Head:    Normocephalic, without obvious abnormality, atraumatic  Eyes:    PERRL, conjunctiva/corneas clear, EOM's intact, fundi    benign, both eyes  Ears:    Normal TM's and external ear canals, both ears  Nose:   Nares normal, septum midline, mucosa normal, no drainage    or sinus tenderness  Throat:   Lips, mucosa, and tongue normal; teeth and gums normal  Neck:   Supple, symmetrical, trachea midline, no adenopathy;    Thyroid: no enlargement/tenderness/nodules  Back:     Symmetric, no curvature, ROM normal, no CVA tenderness  Lungs:     Clear to auscultation bilaterally, respirations unlabored  Chest Wall:    No tenderness or deformity   Heart:    Regular rate and rhythm, S1 and S2 normal, no murmur, rub   or gallop  Breast Exam:    Deferred to GYN  Abdomen:     Soft, non-tender, bowel sounds active all four quadrants,    no masses, no organomegaly  Genitalia:    Deferred to GYN  Rectal:    Extremities:   Extremities normal, atraumatic, no cyanosis or edema  Pulses:   2+ and symmetric all extremities  Skin:    Skin color, texture, turgor normal, no rashes or lesions  Lymph nodes:   Cervical, supraclavicular, and axillary nodes normal  Neurologic:   CNII-XII intact, normal strength, sensation and reflexes    throughout          Assessment & Plan:

## 2013-10-30 NOTE — Progress Notes (Signed)
Pre visit review using our clinic review tool, if applicable. No additional management support is needed unless otherwise documented below in the visit note. 

## 2013-10-30 NOTE — Telephone Encounter (Signed)
Unable to reach patient pre visit.  

## 2013-11-01 ENCOUNTER — Encounter: Payer: Self-pay | Admitting: Family Medicine

## 2013-11-01 NOTE — Assessment & Plan Note (Signed)
Pt's PE WNL w/ exception of weight.  Discussed healthy diet and regular exercise.  Check labs.  Anticipatory guidance provided.

## 2014-02-05 ENCOUNTER — Encounter: Payer: Self-pay | Admitting: Family

## 2014-02-05 ENCOUNTER — Ambulatory Visit (INDEPENDENT_AMBULATORY_CARE_PROVIDER_SITE_OTHER): Payer: BC Managed Care – PPO | Admitting: Family

## 2014-02-05 VITALS — BP 95/62 | HR 58 | Temp 98.1°F | Resp 16 | Ht 64.25 in | Wt 173.8 lb

## 2014-02-05 DIAGNOSIS — R110 Nausea: Secondary | ICD-10-CM

## 2014-02-05 DIAGNOSIS — R109 Unspecified abdominal pain: Secondary | ICD-10-CM

## 2014-02-05 NOTE — Patient Instructions (Signed)
Please start taking an over the counter probiotic. You may also try Zantac as needed. If you would like the Zofran please call and let us now. They will call you to schedule your ultrasound. If your condition worsens please let us know or contact the Emergency Department.

## 2014-02-05 NOTE — Assessment & Plan Note (Signed)
Likely flare of IBS; obtain US to r/o gall bladder and pancreatic issues. Will start probiotic and try to decrease gluten in diet to determine if this helps. Will follow-up following [place]. Instructed to seek further medical care if condition worsens or doesn't improve.

## 2014-02-05 NOTE — Progress Notes (Signed)
Pre visit review using our clinic review tool, if applicable. No additional management support is needed unless otherwise documented below in the visit note. 

## 2014-02-05 NOTE — Progress Notes (Addendum)
   Subjective:    Patient ID: Abigail Singh, female    DOB: [birth year], 38 y.o.   MRN: [id number]  HPI  Abigail Singh is a 38 y/o female who presents today for an acute visit.   Has been experiencing nausea and abdominal cramping following meals for the past 2 weeks. Currently eating a BRAT like diet. She indicates that she has gained 20 pounds since the end of August. She participated in a weight loss group and gotten her weight down to 150. Reports continued fluctuation between diarrhea and constipation. Used to take Activa which helped keep her regular. Denies fever, chills vomiting or any other medications use.   Her diet yesterday consisted of 1 banana, 1 buscuit, small cup of applesace, 2 crackers with peanut butter, 4 grilled chicken nuggets, some french fries, and 2 sodas.   Previously diagnosed with IBS and was being seen by a GI specialist. Has not had occurrence in years and is now currently being followed for polyps  Wt Readings from Last 3 Encounters:  02/05/14 173 lb 12.8 oz (78.835 kg)  10/30/13 167 lb 2 oz (75.807 kg)  10/21/13 169 lb 8 oz (76.885 kg)    Review of Systems    See HPI  BP 95/62  Pulse 58  Temp(Src) 98.1 F (36.7 C) (Oral)  Resp 16  Ht 5' 4.25" (1.632 m)  Wt 173 lb 12.8 oz (78.835 kg)  BMI 29.60 kg/m2  SpO2 98%  LMP 01/29/2014 Vital signs and nursing notes reviewed.   Objective:   Physical Exam  Constitutional: She appears well-developed and well-nourished. No distress.  HENT:  Head: Normocephalic.  Eyes: Conjunctivae are normal. Pupils are equal, round, and reactive to light.  Cardiovascular: Normal rate, regular rhythm and normal heart sounds.   Pulmonary/Chest: Effort normal and breath sounds normal.  Abdominal: Soft. Bowel sounds are normal. There is tenderness (mild epigastric tenderness). There is no rigidity, no rebound, no guarding, no tenderness at McBurney's point and negative Murphy's sign.  Skin: Skin is warm and dry.  Psychiatric:  She has a normal mood and affect. Her behavior is normal. Judgment and thought content normal.      Assessment & Plan:

## 2014-02-06 ENCOUNTER — Encounter: Payer: Self-pay | Admitting: Internal Medicine

## 2014-02-09 ENCOUNTER — Ambulatory Visit (HOSPITAL_BASED_OUTPATIENT_CLINIC_OR_DEPARTMENT_OTHER): Payer: BC Managed Care – PPO

## 2014-02-10 ENCOUNTER — Ambulatory Visit (HOSPITAL_BASED_OUTPATIENT_CLINIC_OR_DEPARTMENT_OTHER): Payer: BC Managed Care – PPO

## 2014-02-11 ENCOUNTER — Encounter: Payer: Self-pay | Admitting: Family

## 2014-02-11 ENCOUNTER — Ambulatory Visit (HOSPITAL_BASED_OUTPATIENT_CLINIC_OR_DEPARTMENT_OTHER)
Admission: RE | Admit: 2014-02-11 | Discharge: 2014-02-11 | Disposition: A | Discharge status: Home / Self Care | Payer: BC Managed Care – PPO | Source: Ambulatory Visit | Attending: Family | Admitting: Family

## 2014-02-11 DIAGNOSIS — R110 Nausea: Secondary | ICD-10-CM | POA: Diagnosis not present

## 2014-02-11 DIAGNOSIS — R109 Unspecified abdominal pain: Secondary | ICD-10-CM

## 2014-02-11 DIAGNOSIS — R1013 Epigastric pain: Secondary | ICD-10-CM | POA: Diagnosis present

## 2014-02-19 ENCOUNTER — Other Ambulatory Visit: Payer: Self-pay

## 2014-03-08 ENCOUNTER — Encounter: Payer: Self-pay | Admitting: Family

## 2014-10-11 ENCOUNTER — Telehealth: Payer: Self-pay | Admitting: Family Medicine

## 2014-10-11 NOTE — Telephone Encounter (Signed)
Pre Visit letter sent  °

## 2014-10-18 ENCOUNTER — Telehealth: Payer: Self-pay | Admitting: Family Medicine

## 2014-10-18 MED ORDER — AZITHROMYCIN 250 MG PO TABS: ORAL_TABLET | ORAL | Status: DC

## 2014-10-18 NOTE — Telephone Encounter (Signed)
[place] for [employer] but please verify that pt is not allergic as she has erythromycin on allergy list and is asking for Azithromycin.

## 2014-10-18 NOTE — Telephone Encounter (Signed)
Caller name: Ismael Treptow Relationship to patient: self Can be reached: [phone] Pharmacy: [facility] [address]  Reason for call: Pt is traveling out of the country. Leaving tomorrow morning 10/19/14. She would like to know if a zpac can be prescribed for her as she is already feeling like she is getting a sinus infection. I advised pt that she has not been seen in 1 year and may not be able to do this without being seen. Please contact pt.

## 2014-10-18 NOTE — Telephone Encounter (Signed)
Spoke with pt who advised that she has taken the zpack in the past with problems. Medication filled to the pharmacy as requested and pt notified.

## 2014-11-01 ENCOUNTER — Ambulatory Visit (INDEPENDENT_AMBULATORY_CARE_PROVIDER_SITE_OTHER): Payer: BC Managed Care – PPO | Admitting: Family Medicine

## 2014-11-01 ENCOUNTER — Encounter: Payer: Self-pay | Admitting: Family Medicine

## 2014-11-01 VITALS — BP 102/72 | HR 71 | Temp 98.2°F | Resp 16 | Ht 64.5 in | Wt 176.2 lb

## 2014-11-01 DIAGNOSIS — Z0000 Encounter for general adult medical examination without abnormal findings: Secondary | ICD-10-CM | POA: Diagnosis not present

## 2014-11-01 DIAGNOSIS — M722 Plantar fascial fibromatosis: Secondary | ICD-10-CM | POA: Insufficient documentation

## 2014-11-01 DIAGNOSIS — R635 Abnormal weight gain: Secondary | ICD-10-CM

## 2014-11-01 LAB — CBC WITH DIFFERENTIAL/PLATELET
BASOS ABS: 0 10*3/uL (ref 0.0–0.1)
Basophils Relative: 0.5 % (ref 0.0–3.0)
EOS ABS: 0.1 10*3/uL (ref 0.0–0.7)
Eosinophils Relative: 1.4 % (ref 0.0–5.0)
HEMATOCRIT: 38.5 % (ref 36.0–46.0)
Hemoglobin: 13 g/dL (ref 12.0–15.0)
LYMPHS ABS: 1.7 10*3/uL (ref 0.7–4.0)
Lymphocytes Relative: 30.9 % (ref 12.0–46.0)
MCHC: 33.8 g/dL (ref 30.0–36.0)
MCV: 87.7 fl (ref 78.0–100.0)
Monocytes Absolute: 0.4 10*3/uL (ref 0.1–1.0)
Monocytes Relative: 6.6 % (ref 3.0–12.0)
Neutro Abs: 3.2 10*3/uL (ref 1.4–7.7)
Neutrophils Relative %: 60.6 % (ref 43.0–77.0)
PLATELETS: 246 10*3/uL (ref 150.0–400.0)
RBC: 4.39 Mil/uL (ref 3.87–5.11)
RDW: 13.9 % (ref 11.5–15.5)
WBC: 5.4 10*3/uL (ref 4.0–10.5)

## 2014-11-01 LAB — HEPATIC FUNCTION PANEL
ALBUMIN: 3.9 g/dL (ref 3.5–5.2)
ALT: 10 U/L (ref 0–35)
AST: 15 U/L (ref 0–37)
Alkaline Phosphatase: 67 U/L (ref 39–117)
Bilirubin, Direct: 0 mg/dL (ref 0.0–0.3)
TOTAL PROTEIN: 7.1 g/dL (ref 6.0–8.3)
Total Bilirubin: 0.4 mg/dL (ref 0.2–1.2)

## 2014-11-01 LAB — LIPID PANEL
Cholesterol: 142 mg/dL (ref 0–200)
HDL: 39.4 mg/dL (ref >39.00)
LDL CALC: 83 mg/dL (ref 0–99)
NonHDL: 102.6
Total CHOL/HDL Ratio: 4
Triglycerides: 100 mg/dL (ref 0.0–149.0)
VLDL: 20 mg/dL (ref 0.0–40.0)

## 2014-11-01 LAB — BASIC METABOLIC PANEL
BUN: 8 mg/dL (ref 6–23)
CALCIUM: 8.9 mg/dL (ref 8.4–10.5)
CO2: 27 mEq/L (ref 19–32)
CREATININE: 0.69 mg/dL (ref 0.40–1.20)
Chloride: 105 mEq/L (ref 96–112)
GFR: 100.77 mL/min (ref >60.00)
GLUCOSE: 88 mg/dL (ref 70–99)
Potassium: 3.9 mEq/L (ref 3.5–5.1)
Sodium: 136 mEq/L (ref 135–145)

## 2014-11-01 LAB — TSH: TSH: 0.77 u[IU]/mL (ref 0.35–4.50)

## 2014-11-01 LAB — VITAMIN D 25 HYDROXY (VIT D DEFICIENCY, FRACTURES): VITD: 15.43 ng/mL — ABNORMAL LOW (ref 30.00–100.00)

## 2014-11-01 NOTE — Progress Notes (Signed)
   Subjective:    Patient ID: Abigail Singh, female    DOB: 05/14/1975, 39 y.o.   MRN: [id number]  HPI CPE- Due for GYN ([facility]).  UTD on colonoscopy (due 2017)   Review of Systems Patient reports no vision/ hearing changes, adenopathy,fever,  persistant/recurrent hoarseness, swallowing issues, chest pain, palpitations, edema, persistant/recurrent cough, hemoptysis, dyspnea (rest/exertional/paroxysmal nocturnal), gastrointestinal bleeding (melena, rectal bleeding), abdominal pain, significant heartburn, bowel changes, GU symptoms (dysuria, hematuria, incontinence), Gyn symptoms (abnormal  bleeding, pain),  syncope, focal weakness, memory loss, numbness & tingling, skin/hair/nail changes, abnormal bruising or bleeding, anxiety, or depression.   + weight gain- exercising 30-60 minutes 6 days/week, has eliminated sodas.  'nothing's helping'  Foot pain- bilateral, L>R.  Pt reports pain is worse first thing in the AM, has to walk on outside of feet to avoid putting pressure on medial insoles.  Has bought new shoes w/o relief.    Objective:   Physical Exam General Appearance:    Alert, cooperative, no distress, appears stated age  Head:    Normocephalic, without obvious abnormality, atraumatic  Eyes:    PERRL, conjunctiva/corneas clear, EOM's intact, fundi    benign, both eyes  Ears:    Normal TM's and external ear canals, both ears  Nose:   Nares normal, septum midline, mucosa normal, no drainage    or sinus tenderness  Throat:   Lips, mucosa, and tongue normal; teeth and gums normal  Neck:   Supple, symmetrical, trachea midline, no adenopathy;    Thyroid: no enlargement/tenderness/nodules  Back:     Symmetric, no curvature, ROM normal, no CVA tenderness  Lungs:     Clear to auscultation bilaterally, respirations unlabored  Chest Wall:    No tenderness or deformity   Heart:    Regular rate and rhythm, S1 and S2 normal, no murmur, rub   or gallop  Breast Exam:    Deferred to GYN    Abdomen:     Soft, non-tender, bowel sounds active all four quadrants,    no masses, no organomegaly  Genitalia:    Deferred to GYN  Rectal:    Extremities:   Extremities normal, atraumatic, no cyanosis or edema, TTP over plantar fascia insertion bilaterally  Pulses:   2+ and symmetric all extremities  Skin:   Skin color, texture, turgor normal, no rashes or lesions  Lymph nodes:   Cervical, supraclavicular, and axillary nodes normal  Neurologic:   CNII-XII intact, normal strength, sensation and reflexes    throughout          Assessment & Plan:

## 2014-11-01 NOTE — Assessment & Plan Note (Signed)
New.  Pt's sxs and PE consistent w/ plantar fasciitis.  Reviewed importance of supportive footwear- no flipflops or flats, sneakers as much as possible.  Discussed NSAIDs, ice.  Demonstrated stretches.  If no improvement in 2 weeks, pt to contact me so we can refer to sports med.

## 2014-11-01 NOTE — Assessment & Plan Note (Signed)
Pt's PE WNL w/ exception of being overweight and plantar fasciitis bilaterally.  Due for GYN- pt encouraged to schedule.  UTD on colonoscopy.  Check labs.  Anticipatory guidance provided.

## 2014-11-01 NOTE — Progress Notes (Signed)
Pre visit review using our clinic review tool, if applicable. No additional management support is needed unless otherwise documented below in the visit note. 

## 2014-11-01 NOTE — Patient Instructions (Signed)
Follow up in 3-6 months to recheck weight We'll notify you of your lab results and make any changes if needed Continue to exercise regularly- you're doing great! Try and eat a low carb, veggie and lean meat based diet to assist w/ weight loss For the feet- start Aleve twice daily (w/ food), roll over a frozen water bottle, wear sneakers or other shoes w/ a good arch support, and do calf raises and heel drops off a step (3 sets of 10) If no improvement after 2 weeks, let me know so we can send you to sports med Call with any questions or concerns Have a great summer!!!

## 2014-11-01 NOTE — Assessment & Plan Note (Signed)
Pt has gained 11 lbs since last year's CPE.  Reports exercising regularly but was reluctant to give me a food recall.  Suspect dietary choices are playing a role.  Reviewed need for low carb diet, regular exercise.  Check labs to r/o metabolic cause of weight gain such as thyroid.  Will follow.

## 2014-11-02 ENCOUNTER — Other Ambulatory Visit: Payer: Self-pay | Admitting: General Practice

## 2014-11-02 MED ORDER — VITAMIN D (ERGOCALCIFEROL) 1.25 MG (50000 UNIT) PO CAPS: 50000.0000 [IU] | ORAL_CAPSULE | ORAL | Status: DC

## 2014-12-29 ENCOUNTER — Ambulatory Visit (INDEPENDENT_AMBULATORY_CARE_PROVIDER_SITE_OTHER): Payer: BC Managed Care – PPO | Admitting: Family Medicine

## 2014-12-29 ENCOUNTER — Encounter: Payer: Self-pay | Admitting: Family Medicine

## 2014-12-29 VITALS — BP 100/70 | HR 71 | Temp 98.0°F | Resp 16 | Wt 178.0 lb

## 2014-12-29 DIAGNOSIS — G43009 Migraine without aura, not intractable, without status migrainosus: Secondary | ICD-10-CM | POA: Diagnosis not present

## 2014-12-29 DIAGNOSIS — G43909 Migraine, unspecified, not intractable, without status migrainosus: Secondary | ICD-10-CM | POA: Insufficient documentation

## 2014-12-29 MED ORDER — SUMATRIPTAN SUCCINATE 50 MG PO TABS: 50.0000 mg | ORAL_TABLET | ORAL | Status: DC | PRN

## 2014-12-29 MED ORDER — TOPIRAMATE 25 MG PO TABS: ORAL_TABLET | ORAL | Status: DC

## 2014-12-29 NOTE — Progress Notes (Signed)
Pre visit review using our clinic review tool, if applicable. No additional management support is needed unless otherwise documented below in the visit note. 

## 2014-12-29 NOTE — Assessment & Plan Note (Signed)
Deteriorated.  Pt has hx of similar and was previously on Topamax w/ good control of HAs but stopped when she attempted to get pregnant.  sxs are now occuring almost daily and not relieved w/ OTC measures.  Restart Topamax and titrate to 50mg  BID.  imitrex prn for rescue.  Reviewed supportive care and red flags that should prompt return.  Pt expressed understanding and is in agreement w/ plan.

## 2014-12-29 NOTE — Progress Notes (Signed)
   Subjective:    Patient ID: Abigail Singh, female    DOB: 1975-10-03, 39 y.o.   MRN: [id number]  HPI Migraine- pt has hx of similar.  Pt has not been on any meds for 7-8 years.  'they're getting worse'.  Previously on Topamax.  Pt is having almost daily HAs at this time.  Used Imitrex as rescue med.  Denies current N/V, photo or phonophobia.  No dizziness. No aura.  Pt is using ibuprofen and tylenol as needed w/o improvement.  Not able to tolerate ASA due to allergy.   Review of Systems For ROS see HPI     Objective:   Physical Exam  Constitutional: She is oriented to person, place, and time. She appears well-developed and well-nourished. No distress.  HENT:  Head: Normocephalic and atraumatic.  TMs WNL No TTP over sinuses Minimal nasal congestion  Eyes: Conjunctivae and EOM are normal. Pupils are equal, round, and reactive to light.  Neck: Normal range of motion. Neck supple.  Cardiovascular: Normal rate, regular rhythm, normal heart sounds and intact distal pulses.   Pulmonary/Chest: Effort normal and breath sounds normal. No respiratory distress. She has no wheezes. She has no rales.  Lymphadenopathy:    She has no cervical adenopathy.  Neurological: She is alert and oriented to person, place, and time. She has normal reflexes. No cranial nerve deficit. Coordination normal.  Psychiatric: She has a normal mood and affect. Her behavior is normal. Judgment and thought content normal.  Vitals reviewed.         Assessment & Plan:

## 2014-12-29 NOTE — Patient Instructions (Signed)
Follow up via phone or MyChart in 2-3 weeks to let me know how the migraines are doing Start the Topamax as directed and titrate up to 'standard dose' At the first sign of headache, take a tylenol w/ some caffeine.  If no improvement in 20-30 minutes, take an Imitrex Drink plenty of fluids Call with any questions or concerns Hang in there! Happy Early [place]!!!

## 2015-01-29 ENCOUNTER — Other Ambulatory Visit: Payer: Self-pay | Admitting: Family Medicine

## 2015-01-31 NOTE — Telephone Encounter (Signed)
Med denied, pt needs to continue otc vitamin d 2000iu daily.

## 2015-02-10 ENCOUNTER — Ambulatory Visit (INDEPENDENT_AMBULATORY_CARE_PROVIDER_SITE_OTHER): Payer: BC Managed Care – PPO | Admitting: Medical

## 2015-02-10 ENCOUNTER — Encounter: Payer: Self-pay | Admitting: Medical

## 2015-02-10 VITALS — BP 100/70 | HR 64 | Temp 98.0°F | Ht 64.25 in | Wt 178.0 lb

## 2015-02-10 DIAGNOSIS — H6501 Acute serous otitis media, right ear: Secondary | ICD-10-CM | POA: Diagnosis not present

## 2015-02-10 DIAGNOSIS — J3089 Other allergic rhinitis: Secondary | ICD-10-CM | POA: Diagnosis not present

## 2015-02-10 DIAGNOSIS — J0100 Acute maxillary sinusitis, unspecified: Secondary | ICD-10-CM

## 2015-02-10 MED ORDER — BENZONATATE 100 MG PO CAPS: 100.0000 mg | ORAL_CAPSULE | Freq: Three times a day (TID) | ORAL | Status: DC | PRN

## 2015-02-10 MED ORDER — AZITHROMYCIN 250 MG PO TABS: ORAL_TABLET | ORAL | Status: DC

## 2015-02-10 MED ORDER — FLUTICASONE PROPIONATE 50 MCG/ACT NA SUSP: 2.0000 | Freq: Every day | NASAL | Status: DC

## 2015-02-10 NOTE — Progress Notes (Signed)
Subjective:    Patient ID: Abigail Singh, female    DOB: 11-Jun-1975, 39 y.o.   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 Got nasal congested and sinus pressure. Pt states took otc meds since august. She states nasal congestion started to improve but then 1 wk again the severe nasal congestion returned with bilateral sinus pressure. Also some rt ear pain.  She does report watery eyes. Rare sneeze. Pt has productive cough.  Pt states saw dentist las Wednesday. She had 2 cavities filled. Then day later had some viral stomatitis. Pt was given some mouthwash that helped.  LMP- Pt little late on menses. She states impossible since children sick in her house. And her kids are in bed.   Review of Systems  Constitutional: Positive for fever. Negative for chills and fatigue.       Subjective.  HENT: Positive for congestion, ear pain, sinus pressure and sneezing. Negative for sore throat.   Respiratory: Negative for cough, chest tightness, shortness of breath and wheezing.   Cardiovascular: Negative for chest pain and palpitations.  Musculoskeletal: Negative for back pain.  Skin: Negative for rash.  Neurological: Negative for dizziness, seizures and light-headedness.  Hematological: Negative for adenopathy. Does not bruise/bleed easily.    Past Medical History  Diagnosis Date  . Fibromyalgia   . Migraines   . History of fibromyalgia   . Preterm labor   . Urinary tract infection   . Fibroid   . Heartburn in pregnancy   . Anemia     as teenager  . Status post cesarean delivery (repeat x 2) 03/17/2012  . Postpartum care following cesarean delivery 03/17/2012    Social History   Social History  . Marital Status: Married    Spouse Name: N/A  . Number of Children: N/A  . Years of Education: N/A   Occupational History  . Not on file.   Social History Main Topics  . Smoking status: Former Smoker    Types: Cigarettes    Quit date:  05/07/1997  . Smokeless tobacco: Never Used  . Alcohol Use: No  . Drug Use: No  . Sexual Activity: Yes   Other Topics Concern  . Not on file   Social History Narrative    Past Surgical History  Procedure Laterality Date  . Cesarean section    . Dilation and curettage of uterus    . Wisdom tooth extraction    . Polypectomy    . Colonoscopy    . Tubes in ears    . Cesarean section  03/17/2012    Procedure: CESAREAN SECTION;  Surgeon: Marvene Staff, MD;  Location: [facility];  Service: Obstetrics;  Laterality: N/A;  Repeat C/S.  EDD: 03/23/12  . Abnormal cells in colon/tatoo area      2012    Family History  Problem Relation Age of Onset  . Heart failure      Grandfather  . Heart attack      x's 4 Grandfather  . Stroke      x's 1 Grandfather  . Diabetes    . Diabetes Mother   . Diabetes Father   . Diabetes Maternal Aunt   . Diabetes Maternal Grandmother   . Colon polyps Maternal Grandmother   . Diabetes Maternal Grandfather   . Colon polyps Maternal Grandfather   . Anesthesia problems Neg Hx     Allergies  Allergen Reactions  . Aspirin Anaphylaxis  Pt reports being able to take ibuprofen with no problems.  . Codeine Other (See Comments)    halucinations  . Erythromycin Rash  . Penicillins Rash  . Sulfonamide Derivatives Rash    Childhood reaction, has taken recently with only stomach irritation.    Current Outpatient Prescriptions on File Prior to Visit  Medication Sig Dispense Refill  . acetaminophen (TYLENOL) 325 MG tablet Take 650 mg by mouth every 6 (six) hours as needed.    . Cholecalciferol (VITAMIN D) 2000 UNITS CAPS Take 2,000 Units by mouth daily.    . SUMAtriptan (IMITREX) 50 MG tablet Take 1 tablet (50 mg total) by mouth every 2 (two) hours as needed for migraine. May repeat in 2 hours if headache persists or recurs. 10 tablet 6  . topiramate (TOPAMAX) 25 MG tablet Take 1 tab QHS x1 week and then increase to 1 tab BID x1 week and then 1 tab  qAM and 2 tabs QHS x1 week and then 2 tabs BID 120 tablet 3  . Vitamin D, Ergocalciferol, (DRISDOL) 50000 UNITS CAPS capsule Take 1 capsule (50,000 Units total) by mouth every 7 (seven) days. 4 capsule 2   No current facility-administered medications on file prior to visit.    BP 100/70 mmHg  Pulse 64  Temp(Src) 98 F (36.7 C) (Oral)  Ht 5' 4.25" (1.632 m)  Wt 178 lb (80.74 kg)  BMI 30.31 kg/m2  SpO2 99%       Objective:   Physical Exam  General  Mental Status - Alert. General Appearance - Well groomed. Not in acute distress.  Skin Rashes- No Rashes.  HEENT Head- Normal. Ear Auditory Canal - Left- Normal. Right - Normal.Tympanic Membrane- Left- Normal. Right- mild red Eye Sclera/Conjunctiva- Left- Normal. Right- Normal. Nose & Sinuses Nasal Mucosa- Left-  Boggy and Congested. Right-  Boggy and  Congested.Bilateral maxillary sinus pressure worse on rt side. No  frontal sinus pressure. Mouth & Throat Lips: Upper Lip- Normal: no dryness, cracking, pallor, cyanosis, or vesicular eruption. Lower Lip-Normal: no dryness, cracking, pallor, cyanosis or vesicular eruption. Buccal Mucosa- Bilateral- No Aphthous ulcers. Oropharynx- No Discharge or Erythema. +pnd. Tonsils: Characteristics- Bilateral- No Erythema or Congestion. Size/Enlargement- Bilateral- No enlargement. Discharge- bilateral-None.  Neck Neck- Supple. No Masses.   Chest and Lung Exam Auscultation: Breath Sounds:-Clear even and unlabored.  Cardiovascular Auscultation:Rythm- Regular, rate and rhythm. Murmurs & Other Heart Sounds:Ausculatation of the heart reveal- No Murmurs.  Lymphatic Head & Neck General Head & Neck Lymphatics: Bilateral: Description- No Localized lymphadenopathy.       Assessment & Plan:  You appear to have a sinus infection. I am prescribing azithromycin  antibiotic for the infection. To help with the nasal congestion I prescribed  flonse nasal steroid. For your associated cough, I  prescribed cough medicine benzonatate.  Above should help for OM as well.  Early onset of above may be related to nasal congestion from allergies. Use nasal spray. Hold antihistamine presently until symptoms resolve.    Rest, hydrate, tylenol for fever.  Follow up in 7 days or as needed.

## 2015-02-10 NOTE — Patient Instructions (Addendum)
You appear to have a sinus infection. I am prescribing azithromycin  antibiotic for the infection. To help with the nasal congestion I prescribed  flonse nasal steroid. For your associated cough, I prescribed cough medicine benzonatate.  Above should help for OM as well.  Early onset of above may be related to nasal congestion from allergies. Use nasal spray. Hold antihistamine presently until symptoms resolve.    Rest, hydrate, tylenol for fever.  Follow up in 7 days or as needed.

## 2015-02-10 NOTE — Progress Notes (Signed)
Pre visit review using our clinic review tool, if applicable. No additional management support is needed unless otherwise documented below in the visit note. 

## 2015-05-05 ENCOUNTER — Encounter: Payer: Self-pay | Admitting: Family Medicine

## 2015-05-05 ENCOUNTER — Ambulatory Visit (INDEPENDENT_AMBULATORY_CARE_PROVIDER_SITE_OTHER): Payer: BC Managed Care – PPO | Admitting: Family Medicine

## 2015-05-05 VITALS — BP 106/80 | HR 100 | Temp 98.1°F | Resp 16 | Ht 64.0 in | Wt 172.2 lb

## 2015-05-05 DIAGNOSIS — M722 Plantar fascial fibromatosis: Secondary | ICD-10-CM | POA: Diagnosis not present

## 2015-05-05 DIAGNOSIS — J019 Acute sinusitis, unspecified: Secondary | ICD-10-CM | POA: Diagnosis not present

## 2015-05-05 DIAGNOSIS — G43009 Migraine without aura, not intractable, without status migrainosus: Secondary | ICD-10-CM | POA: Diagnosis not present

## 2015-05-05 MED ORDER — PROMETHAZINE-DM 6.25-15 MG/5ML PO SYRP: 5.0000 mL | ORAL_SOLUTION | Freq: Four times a day (QID) | ORAL | Status: DC | PRN

## 2015-05-05 MED ORDER — TOPIRAMATE 50 MG PO TABS: 50.0000 mg | ORAL_TABLET | Freq: Two times a day (BID) | ORAL | Status: DC

## 2015-05-05 MED ORDER — AZITHROMYCIN 250 MG PO TABS: ORAL_TABLET | ORAL | Status: DC

## 2015-05-05 NOTE — Assessment & Plan Note (Signed)
Improved since starting Topamax.  Will change dosing to make it more convenient.  Script sent.  Pt appreciative.

## 2015-05-05 NOTE — Progress Notes (Signed)
   Subjective:    Patient ID: Abigail Singh, female    DOB: October 20, 1975, 39 y.o.   MRN: [id number]  HPI Migraines- pt would like to change her Topamax from 25mg  to 50mg  BID.  Reports Topamax is working 'really really well'.  No HAs as long as she is taken regularly.  Plantar fasciitis- chronic problem, worsening despite icing and stretching and wearing inserts.  URI- sxs started early December but 'has gotten progressively worse'.  + sinus pain/pressure, worse R maxillary.  Unable to sleep due to cough- cough is productive.  No known sick contacts.  No ear pain.   Review of Systems For ROS see HPI     Objective:   Physical Exam  Constitutional: She is oriented to person, place, and time. She appears well-developed and well-nourished. No distress.  HENT:  Head: Normocephalic and atraumatic.  Right Ear: Tympanic membrane normal.  Left Ear: Tympanic membrane normal.  Nose: Mucosal edema and rhinorrhea present. Right sinus exhibits maxillary sinus tenderness. Right sinus exhibits no frontal sinus tenderness. Left sinus exhibits maxillary sinus tenderness. Left sinus exhibits no frontal sinus tenderness.  Mouth/Throat: Uvula is midline and mucous membranes are normal. Posterior oropharyngeal erythema present. No oropharyngeal exudate.  Eyes: Conjunctivae and EOM are normal. Pupils are equal, round, and reactive to light.  Neck: Normal range of motion. Neck supple.  Cardiovascular: Normal rate, regular rhythm and normal heart sounds.   Pulmonary/Chest: Effort normal and breath sounds normal. No respiratory distress. She has no wheezes.  Dry, hacking cough  Lymphadenopathy:    She has no cervical adenopathy.  Neurological: She is alert and oriented to person, place, and time.  Skin: Skin is warm and dry.  Psychiatric: She has a normal mood and affect. Her behavior is normal. Thought content normal.  Vitals reviewed.         Assessment & Plan:

## 2015-05-05 NOTE — Assessment & Plan Note (Signed)
sxs and PE consistent w/ infxn.  Start abx.  Cough meds prn.  Reviewed supportive care and red flags that should prompt return.  Pt expressed understanding and is in agreement w/ plan.  

## 2015-05-05 NOTE — Patient Instructions (Signed)
Schedule your complete physical for June Start the Zpack as directed Use the cough syrup for nights/weekends (will cause drowsiness) Mucinex DM for daytime cough Drink plenty of fluids REST!!! We'll call you with your Sports Medicine appt The new Topamax dose is 1 tab twice daily Call with any questions or concerns If you want to join [place] at the new [place] office, any scheduled appointments will automatically transfer and we will see you at [address] [place] [address] [place], [place], [place] [address] (OPENING FEB) Happy New Year!!!

## 2015-05-05 NOTE — Assessment & Plan Note (Signed)
Deteriorated.  Pt is icing and stretching and wearing inserts w/o relief.  Refer to sports med for complete evaluation and tx.  Pt expressed understanding and is in agreement w/ plan.

## 2015-05-05 NOTE — Progress Notes (Signed)
Pre visit review using our clinic review tool, if applicable. No additional management support is needed unless otherwise documented below in the visit note. 

## 2015-05-11 ENCOUNTER — Encounter: Payer: Self-pay | Admitting: Family Medicine

## 2015-05-11 ENCOUNTER — Ambulatory Visit (INDEPENDENT_AMBULATORY_CARE_PROVIDER_SITE_OTHER): Payer: BC Managed Care – PPO | Admitting: Family Medicine

## 2015-05-11 ENCOUNTER — Ambulatory Visit: Payer: BC Managed Care – PPO | Admitting: Family Medicine

## 2015-05-11 VITALS — BP 139/83 | HR 71 | Ht 63.0 in | Wt 172.0 lb

## 2015-05-11 DIAGNOSIS — M722 Plantar fascial fibromatosis: Secondary | ICD-10-CM | POA: Diagnosis not present

## 2015-05-11 NOTE — Patient Instructions (Signed)
You have plantar fasciitis Take tylenol or aleve as needed for pain  Plantar fascia stretch for 20-30 seconds (do 3 of these) in morning Lowering/raise on a step exercises 3 x 10 once or twice a day - this is very important for long term recovery. Can add heel walks, toe walks forward and backward as well Ice heel for 15 minutes as needed. Avoid flat shoes/barefoot walking as much as possible. Arch straps have been shown to help with pain - wear regularly when up and walking around. Heel lifts may help with pain by avoiding fully stretching the plantar fascia except when doing home exercises. Use your orthotics regularly - you can consider the green insoles or custom orthotics we have as well. Consider night splints. Steroid injection is a consideration for short term pain relief if you are struggling. Physical therapy is also an option. Follow up with me in 6 weeks for reevaluation.

## 2015-05-13 NOTE — Progress Notes (Signed)
PCP and consultation requested by: Annye Asa, MD  Subjective:   HPI: Patient is a 40 y.o. female here for bilateral foot pain.  Patient reports she started to get bilateral plantar foot pain in August 2015. 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 Worse by end of day. Floor is concrete surface. Pain is worse on right than left. Pain level 6/10, sharp. Tried inserts from [employer] which help some, rolling on iced bottle, stretching. Wakes her up at night at times. Radiates up leg. No skin changes, fever, other complaints.  Past Medical History  Diagnosis Date  . Fibromyalgia   . Migraines   . History of fibromyalgia   . Preterm labor   . Urinary tract infection   . Fibroid   . Heartburn in pregnancy   . Anemia     as teenager  . Status post cesarean delivery (repeat x 2) 03/17/2012  . Postpartum care following cesarean delivery 03/17/2012    Current Outpatient Prescriptions on File Prior to Visit  Medication Sig Dispense Refill  . acetaminophen (TYLENOL) 325 MG tablet Take 650 mg by mouth every 6 (six) hours as needed.    Marland Kitchen azithromycin (ZITHROMAX) 250 MG tablet 2 tabs on day 1, 1 tab on day 2-5 6 tablet 0  . Cholecalciferol (VITAMIN D) 2000 UNITS CAPS Take 2,000 Units by mouth daily.    . fluticasone (FLONASE) 50 MCG/ACT nasal spray Place 2 sprays into both nostrils daily. 16 g 1  . promethazine-dextromethorphan (PROMETHAZINE-DM) 6.25-15 MG/5ML syrup Take 5 mLs by mouth 4 (four) times daily as needed. 240 mL 0  . SUMAtriptan (IMITREX) 50 MG tablet Take 1 tablet (50 mg total) by mouth every 2 (two) hours as needed for migraine. May repeat in 2 hours if headache persists or recurs. 10 tablet 6  . topiramate (TOPAMAX) 50 MG tablet Take 1 tablet (50 mg total) by mouth 2 (two) times daily. 60 tablet 3   No current facility-administered medications on file prior to visit.    Past Surgical History  Procedure Laterality Date  . Cesarean section    . Dilation and  curettage of uterus    . Wisdom tooth extraction    . Polypectomy    . Colonoscopy    . Tubes in ears    . Cesarean section  03/17/2012    Procedure: CESAREAN SECTION;  Surgeon: Marvene Staff, MD;  Location: [facility];  Service: Obstetrics;  Laterality: N/A;  Repeat C/S.  EDD: 03/23/12  . Abnormal cells in colon/tatoo area      2012    Allergies  Allergen Reactions  . Aspirin Anaphylaxis    Pt reports being able to take ibuprofen with no problems.  . Codeine Other (See Comments)    halucinations  . Erythromycin Rash  . Penicillins Rash  . Sulfonamide Derivatives Rash    Childhood reaction, has taken recently with only stomach irritation.    Social History   Social History  . Marital Status: Married    Spouse Name: N/A  . Number of Children: N/A  . Years of Education: N/A   Occupational History  . Not on file.   Social History Main Topics  . Smoking status: Former Smoker    Types: Cigarettes    Quit date: 05/07/1997  . Smokeless tobacco: Never Used  . Alcohol Use: No  . Drug Use: No  . Sexual Activity: Yes   Other Topics Concern  . Not on file   Social History  Narrative    Family History  Problem Relation Age of Onset  . Heart failure      Grandfather  . Heart attack      x's 4 Grandfather  . Stroke      x's 1 Grandfather  . Diabetes    . Diabetes Mother   . Diabetes Father   . Diabetes Maternal Aunt   . Diabetes Maternal Grandmother   . Colon polyps Maternal Grandmother   . Diabetes Maternal Grandfather   . Colon polyps Maternal Grandfather   . Anesthesia problems Neg Hx     BP 139/83 mmHg  Pulse 71  Ht 5\' 3"  (1.6 m)  Wt 172 lb (78.019 kg)  BMI 30.48 kg/m2  Review of Systems: See HPI above.    Objective:  Physical Exam:  Gen: NAD, comfortable in exam room  Bilateral feet/ankles: Mild overpronation.  No bruising, swelling, other deformity. FROM ankle without pain. TTP plantar fascia at insertion on calcanei medially. Negative  ant drawer and talar tilt.   Negative syndesmotic compression. Negative calcaneal squeeze. Thompsons test negative. NV intact distally.    Assessment & Plan:  1. Bilateral plantar fasciitis - Reviewed home exercises to do daily, stretches as well.  Arch binders.  Continue with orthotics she has - consider sports insoles, custom orthotics.  Consider injections, physical therapy.  Tylenol/nsaids and icing.  F/u in 6 weeks for reevaluation.

## 2015-05-13 NOTE — Assessment & Plan Note (Signed)
Reviewed home exercises to do daily, stretches as well.  Arch binders.  Continue with orthotics she has - consider sports insoles, custom orthotics.  Consider injections, physical therapy.  Tylenol/nsaids and icing.  F/u in 6 weeks for reevaluation.

## 2015-06-29 ENCOUNTER — Encounter: Payer: Self-pay | Admitting: Internal Medicine

## 2015-07-01 ENCOUNTER — Ambulatory Visit (INDEPENDENT_AMBULATORY_CARE_PROVIDER_SITE_OTHER): Payer: BC Managed Care – PPO | Admitting: Behavioral Health

## 2015-07-01 DIAGNOSIS — Z23 Encounter for immunization: Secondary | ICD-10-CM | POA: Diagnosis not present

## 2015-07-01 NOTE — Progress Notes (Signed)
Pre visit review using our clinic review tool, if applicable. No additional management support is needed unless otherwise documented below in the visit note. 

## 2015-10-28 ENCOUNTER — Other Ambulatory Visit: Payer: Self-pay | Admitting: General Practice

## 2015-10-28 DIAGNOSIS — G43009 Migraine without aura, not intractable, without status migrainosus: Secondary | ICD-10-CM

## 2015-10-28 MED ORDER — TOPIRAMATE 50 MG PO TABS: 50.0000 mg | ORAL_TABLET | Freq: Two times a day (BID) | ORAL | Status: DC

## 2015-11-17 ENCOUNTER — Other Ambulatory Visit: Payer: Self-pay | Admitting: General Practice

## 2015-11-17 DIAGNOSIS — G43009 Migraine without aura, not intractable, without status migrainosus: Secondary | ICD-10-CM

## 2015-11-17 NOTE — Telephone Encounter (Signed)
Last OV 05/05/15 (pt is overdue for a CPE last 10/2014) topimax last filled 10/28/15 #60 with 3

## 2015-11-21 ENCOUNTER — Telehealth: Payer: Self-pay | Admitting: Family Medicine

## 2015-11-21 NOTE — Telephone Encounter (Signed)
Spoke with the pharmacist and advised that this medication has been denied because on 10/28/15 pt was given #60 with 3 refills. Pharmacist apologized when she looked in pt's record and seen the refills present.

## 2015-11-21 NOTE — Telephone Encounter (Signed)
Raven, with [facility], calling to check the status for refill request for topiramate (TOPAMAX) 50 MG tablet.

## 2015-11-29 ENCOUNTER — Other Ambulatory Visit: Payer: Self-pay | Admitting: General Practice

## 2015-11-29 DIAGNOSIS — G43009 Migraine without aura, not intractable, without status migrainosus: Secondary | ICD-10-CM

## 2015-11-29 MED ORDER — TOPIRAMATE 50 MG PO TABS: 50.0000 mg | ORAL_TABLET | Freq: Two times a day (BID) | ORAL | 3 refills | Status: DC

## 2016-01-06 ENCOUNTER — Ambulatory Visit (INDEPENDENT_AMBULATORY_CARE_PROVIDER_SITE_OTHER): Payer: BC Managed Care – PPO | Admitting: Psychology

## 2016-01-06 DIAGNOSIS — F4310 Post-traumatic stress disorder, unspecified: Secondary | ICD-10-CM | POA: Diagnosis not present

## 2016-01-27 ENCOUNTER — Ambulatory Visit (INDEPENDENT_AMBULATORY_CARE_PROVIDER_SITE_OTHER): Payer: BC Managed Care – PPO | Admitting: Psychology

## 2016-01-27 ENCOUNTER — Ambulatory Visit (INDEPENDENT_AMBULATORY_CARE_PROVIDER_SITE_OTHER): Payer: BC Managed Care – PPO | Admitting: Family Medicine

## 2016-01-27 ENCOUNTER — Encounter: Payer: Self-pay | Admitting: Family Medicine

## 2016-01-27 VITALS — BP 118/73 | HR 63 | Temp 98.1°F | Resp 16 | Ht 63.0 in | Wt 177.4 lb

## 2016-01-27 DIAGNOSIS — F4310 Post-traumatic stress disorder, unspecified: Secondary | ICD-10-CM

## 2016-01-27 DIAGNOSIS — G43009 Migraine without aura, not intractable, without status migrainosus: Secondary | ICD-10-CM | POA: Diagnosis not present

## 2016-01-27 DIAGNOSIS — Z23 Encounter for immunization: Secondary | ICD-10-CM | POA: Diagnosis not present

## 2016-01-27 DIAGNOSIS — E66811 Obesity, class 1: Secondary | ICD-10-CM

## 2016-01-27 DIAGNOSIS — E669 Obesity, unspecified: Secondary | ICD-10-CM | POA: Diagnosis not present

## 2016-01-27 LAB — CBC WITH DIFFERENTIAL/PLATELET
BASOS PCT: 0.9 % (ref 0.0–3.0)
Basophils Absolute: 0.1 10*3/uL (ref 0.0–0.1)
EOS PCT: 0.8 % (ref 0.0–5.0)
Eosinophils Absolute: 0.1 10*3/uL (ref 0.0–0.7)
HEMATOCRIT: 38.5 % (ref 36.0–46.0)
HEMOGLOBIN: 13 g/dL (ref 12.0–15.0)
LYMPHS PCT: 34.7 % (ref 12.0–46.0)
Lymphs Abs: 2.4 10*3/uL (ref 0.7–4.0)
MCHC: 33.9 g/dL (ref 30.0–36.0)
MCV: 87.8 fl (ref 78.0–100.0)
MONO ABS: 0.4 10*3/uL (ref 0.1–1.0)
Monocytes Relative: 6.2 % (ref 3.0–12.0)
Neutro Abs: 4 10*3/uL (ref 1.4–7.7)
Neutrophils Relative %: 57.4 % (ref 43.0–77.0)
Platelets: 227 10*3/uL (ref 150.0–400.0)
RBC: 4.38 Mil/uL (ref 3.87–5.11)
RDW: 13.3 % (ref 11.5–15.5)
WBC: 7 10*3/uL (ref 4.0–10.5)

## 2016-01-27 LAB — HEPATIC FUNCTION PANEL
ALT: 13 U/L (ref 0–35)
AST: 17 U/L (ref 0–37)
Albumin: 4 g/dL (ref 3.5–5.2)
Alkaline Phosphatase: 68 U/L (ref 39–117)
Bilirubin, Direct: 0.1 mg/dL (ref 0.0–0.3)
Total Bilirubin: 0.4 mg/dL (ref 0.2–1.2)
Total Protein: 7.1 g/dL (ref 6.0–8.3)

## 2016-01-27 LAB — BASIC METABOLIC PANEL
BUN: 9 mg/dL (ref 6–23)
CHLORIDE: 102 meq/L (ref 96–112)
CO2: 31 mEq/L (ref 19–32)
CREATININE: 0.67 mg/dL (ref 0.40–1.20)
Calcium: 9 mg/dL (ref 8.4–10.5)
GFR: 103.59 mL/min (ref >60.00)
Glucose, Bld: 83 mg/dL (ref 70–99)
Potassium: 4.3 mEq/L (ref 3.5–5.1)
Sodium: 139 mEq/L (ref 135–145)

## 2016-01-27 LAB — LIPID PANEL
Cholesterol: 153 mg/dL (ref 0–200)
HDL: 51.1 mg/dL (ref >39.00)
LDL Cholesterol: 90 mg/dL (ref 0–99)
NonHDL: 102.16
Total CHOL/HDL Ratio: 3
Triglycerides: 60 mg/dL (ref 0.0–149.0)
VLDL: 12 mg/dL (ref 0.0–40.0)

## 2016-01-27 LAB — TSH: TSH: 1.3 u[IU]/mL (ref 0.35–4.50)

## 2016-01-27 LAB — VITAMIN D 25 HYDROXY (VIT D DEFICIENCY, FRACTURES): VITD: 18.84 ng/mL — ABNORMAL LOW (ref 30.00–100.00)

## 2016-01-27 MED ORDER — PROPRANOLOL HCL ER 80 MG PO CP24: 80.0000 mg | ORAL_CAPSULE | Freq: Every day | ORAL | 3 refills | Status: DC

## 2016-01-27 NOTE — Progress Notes (Signed)
   Subjective:    Patient ID: Abigail Singh, female    DOB: 1975/06/17, 40 y.o.   MRN: [id number]  HPI Migraine- pt has been on Topamax since January and has still be having really bad headaches.  Headaches made it difficult to fall asleep, was waking her.  Stopped the Topamax and there has been no change in headaches.  Previously saw Dr Erling Cruz.  Overweight- pt is exercising 5-6 days/week, eating well and is very frustrated that she has gained more weight (5 lbs).  Not eating excessive carbs.  Eating a lot of salad and chicken.  Eating yogurt and banana or oatmeal for breakfast.  Having morning snack- granola, cheese, fruit.  Lunch- leftovers, sandwiches, salads.  Dinner- 2 vegetables and protein.   Review of Systems For ROS see HPI      Objective:   Physical Exam  Constitutional: She is oriented to person, place, and time. She appears well-developed and well-nourished. No distress.  obese  HENT:  Head: Normocephalic and atraumatic.  Eyes: Conjunctivae and EOM are normal. Pupils are equal, round, and reactive to light.  Neck: Normal range of motion. Neck supple. No thyromegaly present.  Cardiovascular: Normal rate, regular rhythm, normal heart sounds and intact distal pulses.   No murmur heard. Pulmonary/Chest: Effort normal and breath sounds normal. No respiratory distress.  Abdominal: Soft. She exhibits no distension. There is no tenderness.  Musculoskeletal: She exhibits no edema.  Lymphadenopathy:    She has no cervical adenopathy.  Neurological: She is alert and oriented to person, place, and time.  Skin: Skin is warm and dry.  Psychiatric: She has a normal mood and affect. Her behavior is normal.  Vitals reviewed.         Assessment & Plan:

## 2016-01-27 NOTE — Progress Notes (Signed)
Pre visit review using our clinic review tool, if applicable. No additional management support is needed unless otherwise documented below in the visit note. 

## 2016-01-27 NOTE — Patient Instructions (Signed)
We'll notify you of your lab results and make any changes if needed Restart the Topamax twice daily Start the Propranolol once daily to prevent headaches If no improvement after 2-3 weeks in the headaches, let me know and we'll refer to Neurology Keep up the good work on healthy food choices and regular exercise- you're doing great! We'll add the phentermine if the labs all look good Call with any questions or concerns Hang in there!!!

## 2016-01-29 NOTE — Assessment & Plan Note (Signed)
Deteriorated.  Pt is having more frequent headaches recently.  She stopped her Topamax b/c she was concerned that this was contributing to weight gain and was no longer controlling her headaches thinking that the medicine may actually be causing them- but there was no change in HAs w/ stopping medication.  She will restart Topamax twice daily.  We will also add low dose beta blocker to decrease frequency.  If no improvement, will see neuro.  Pt expressed understanding and is in agreement w/ plan.

## 2016-01-29 NOTE — Assessment & Plan Note (Signed)
Ongoing issue for pt despite what sounds like a large lifestyle change.  She is exercising regularly, eating much better but not losing weight.  Check labs to r/o metabolic cause.  If no obvious obstacle to weight loss on labs (thyroid abnormality), will proceed w/ Phentermine to jump start metabolism.  Pt expressed understanding and is in agreement w/ plan.

## 2016-01-30 ENCOUNTER — Other Ambulatory Visit: Payer: Self-pay | Admitting: General Practice

## 2016-01-30 MED ORDER — PHENTERMINE HCL 37.5 MG PO CAPS: 37.5000 mg | ORAL_CAPSULE | ORAL | 3 refills | Status: DC

## 2016-01-30 MED ORDER — VITAMIN D (ERGOCALCIFEROL) 1.25 MG (50000 UNIT) PO CAPS: 50000.0000 [IU] | ORAL_CAPSULE | ORAL | 0 refills | Status: DC

## 2016-02-10 ENCOUNTER — Ambulatory Visit (INDEPENDENT_AMBULATORY_CARE_PROVIDER_SITE_OTHER): Payer: BC Managed Care – PPO | Admitting: Psychology

## 2016-02-10 DIAGNOSIS — F4310 Post-traumatic stress disorder, unspecified: Secondary | ICD-10-CM

## 2016-02-22 ENCOUNTER — Ambulatory Visit (INDEPENDENT_AMBULATORY_CARE_PROVIDER_SITE_OTHER): Payer: BC Managed Care – PPO | Admitting: Psychology

## 2016-02-22 DIAGNOSIS — F4320 Adjustment disorder, unspecified: Secondary | ICD-10-CM

## 2016-03-05 ENCOUNTER — Ambulatory Visit (INDEPENDENT_AMBULATORY_CARE_PROVIDER_SITE_OTHER): Payer: BC Managed Care – PPO | Admitting: Family Medicine

## 2016-03-05 ENCOUNTER — Encounter: Payer: Self-pay | Admitting: Family Medicine

## 2016-03-05 VITALS — BP 122/80 | HR 76 | Temp 97.7°F | Resp 16 | Ht 63.0 in | Wt 168.5 lb

## 2016-03-05 DIAGNOSIS — E669 Obesity, unspecified: Secondary | ICD-10-CM

## 2016-03-05 NOTE — Assessment & Plan Note (Signed)
Pt is doing much better since starting Phentermine.  She was previously on Adderall for her migraines and since starting the phentermine, she reports better concentration, improved energy, less HAs, and improved fibromyalgia sxs.  We discussed that when the phentermine runs out it would be a good idea to restart a low dose extended release Adderall.  Pt expressed understanding and is in agreement w/ plan.

## 2016-03-05 NOTE — Progress Notes (Signed)
Pre visit review using our clinic review tool, if applicable. No additional management support is needed unless otherwise documented below in the visit note. 

## 2016-03-05 NOTE — Progress Notes (Signed)
   Subjective:    Patient ID: Abigail Singh, female    DOB: 24-Apr-1976, 40 y.o.   MRN: [id number]  HPI Obesity- pt was started on Phentermine at last visit.  Pt has lost 9 lbs since 9/22.  BP and HR are normal.  Pt reports feeling better since starting med- she is again able to focus (previously on Adderall)  Denies insomnia, palpitations.  HAs have actually improved since starting Phentermine.  Pt reports improved energy.     Review of Systems For ROS see HPI     Objective:   Physical Exam  Constitutional: She is oriented to person, place, and time. She appears well-developed and well-nourished. No distress.  HENT:  Head: Normocephalic and atraumatic.  Eyes: Conjunctivae and EOM are normal. Pupils are equal, round, and reactive to light.  Neck: Normal range of motion. Neck supple. No thyromegaly present.  Cardiovascular: Normal rate, regular rhythm, normal heart sounds and intact distal pulses.   No murmur heard. Pulmonary/Chest: Effort normal and breath sounds normal. No respiratory distress.  Abdominal: Soft. She exhibits no distension. There is no tenderness.  Musculoskeletal: She exhibits no edema.  Lymphadenopathy:    She has no cervical adenopathy.  Neurological: She is alert and oriented to person, place, and time.  Skin: Skin is warm and dry.  Psychiatric: She has a normal mood and affect. Her behavior is normal.  Vitals reviewed.         Assessment & Plan:

## 2016-03-05 NOTE — Patient Instructions (Signed)
Follow up as needed Let me know when the phentermine script runs out and we can start the low dose Adderall Keep up the good work on healthy diet and regular exercise- you look great!! Call with any questions or concerns Happy Halloween!!!

## 2016-03-09 ENCOUNTER — Ambulatory Visit (INDEPENDENT_AMBULATORY_CARE_PROVIDER_SITE_OTHER): Payer: BC Managed Care – PPO | Admitting: Psychology

## 2016-03-09 DIAGNOSIS — F4320 Adjustment disorder, unspecified: Secondary | ICD-10-CM

## 2016-03-23 ENCOUNTER — Ambulatory Visit (INDEPENDENT_AMBULATORY_CARE_PROVIDER_SITE_OTHER): Payer: BC Managed Care – PPO | Admitting: Psychology

## 2016-03-23 DIAGNOSIS — F4320 Adjustment disorder, unspecified: Secondary | ICD-10-CM | POA: Diagnosis not present

## 2016-04-06 ENCOUNTER — Ambulatory Visit (INDEPENDENT_AMBULATORY_CARE_PROVIDER_SITE_OTHER): Payer: BC Managed Care – PPO | Admitting: Psychology

## 2016-04-06 DIAGNOSIS — F4320 Adjustment disorder, unspecified: Secondary | ICD-10-CM | POA: Diagnosis not present

## 2016-04-11 ENCOUNTER — Telehealth: Payer: Self-pay | Admitting: Family Medicine

## 2016-04-11 MED ORDER — AZITHROMYCIN 250 MG PO TABS: ORAL_TABLET | ORAL | 0 refills | Status: DC

## 2016-04-11 NOTE — Telephone Encounter (Signed)
Pt states that she a sinus infection and on vacation at [facility], she is asking if [facility] will call in a zpac, [facility] ph [phone], fax [phone], pt states she does not transportation to get to a Dr/Urgent Care.

## 2016-04-11 NOTE — Telephone Encounter (Signed)
Called pt and LMOVM with PCP recommendations. Medication was filled to [facility] as requested.

## 2016-04-11 NOTE — Telephone Encounter (Signed)
[place] for [employer] but if no improvement after medication, will need appt

## 2016-05-04 ENCOUNTER — Ambulatory Visit (INDEPENDENT_AMBULATORY_CARE_PROVIDER_SITE_OTHER): Payer: BC Managed Care – PPO | Admitting: Psychology

## 2016-05-04 DIAGNOSIS — F4320 Adjustment disorder, unspecified: Secondary | ICD-10-CM

## 2016-05-21 ENCOUNTER — Ambulatory Visit (INDEPENDENT_AMBULATORY_CARE_PROVIDER_SITE_OTHER): Payer: BC Managed Care – PPO | Admitting: Psychology

## 2016-05-21 DIAGNOSIS — F4320 Adjustment disorder, unspecified: Secondary | ICD-10-CM | POA: Diagnosis not present

## 2016-06-01 ENCOUNTER — Other Ambulatory Visit: Payer: Self-pay | Admitting: Family Medicine

## 2016-06-01 DIAGNOSIS — G43009 Migraine without aura, not intractable, without status migrainosus: Secondary | ICD-10-CM

## 2016-06-03 ENCOUNTER — Encounter: Payer: Self-pay | Admitting: Family Medicine

## 2016-06-05 ENCOUNTER — Other Ambulatory Visit: Payer: Self-pay | Admitting: Family Medicine

## 2016-06-05 MED ORDER — AMPHETAMINE-DEXTROAMPHET ER 10 MG PO CP24: 10.0000 mg | ORAL_CAPSULE | Freq: Every day | ORAL | 0 refills | Status: DC

## 2016-06-05 NOTE — Progress Notes (Signed)
v

## 2016-06-08 ENCOUNTER — Ambulatory Visit (INDEPENDENT_AMBULATORY_CARE_PROVIDER_SITE_OTHER): Payer: BC Managed Care – PPO | Admitting: Psychology

## 2016-06-08 DIAGNOSIS — F4320 Adjustment disorder, unspecified: Secondary | ICD-10-CM | POA: Diagnosis not present

## 2016-06-20 ENCOUNTER — Ambulatory Visit (INDEPENDENT_AMBULATORY_CARE_PROVIDER_SITE_OTHER): Payer: BC Managed Care – PPO | Admitting: Psychology

## 2016-06-20 DIAGNOSIS — F4320 Adjustment disorder, unspecified: Secondary | ICD-10-CM | POA: Diagnosis not present

## 2016-06-22 ENCOUNTER — Ambulatory Visit: Payer: BC Managed Care – PPO | Admitting: Psychology

## 2016-07-02 ENCOUNTER — Ambulatory Visit (INDEPENDENT_AMBULATORY_CARE_PROVIDER_SITE_OTHER): Payer: BC Managed Care – PPO | Admitting: Psychology

## 2016-07-02 DIAGNOSIS — F4320 Adjustment disorder, unspecified: Secondary | ICD-10-CM

## 2016-07-12 ENCOUNTER — Encounter: Payer: Self-pay | Admitting: Family Medicine

## 2016-07-12 NOTE — Telephone Encounter (Signed)
Last OV 03/05/16 adderall last filled 06/05/16 #30 with 0

## 2016-07-13 MED ORDER — AMPHETAMINE-DEXTROAMPHET ER 10 MG PO CP24: 10.0000 mg | ORAL_CAPSULE | Freq: Every day | ORAL | 0 refills | Status: DC

## 2016-07-16 ENCOUNTER — Ambulatory Visit (INDEPENDENT_AMBULATORY_CARE_PROVIDER_SITE_OTHER): Payer: BC Managed Care – PPO | Admitting: Psychology

## 2016-07-16 DIAGNOSIS — F4320 Adjustment disorder, unspecified: Secondary | ICD-10-CM | POA: Diagnosis not present

## 2016-08-03 ENCOUNTER — Ambulatory Visit (INDEPENDENT_AMBULATORY_CARE_PROVIDER_SITE_OTHER): Payer: BC Managed Care – PPO | Admitting: Psychology

## 2016-08-03 DIAGNOSIS — F4320 Adjustment disorder, unspecified: Secondary | ICD-10-CM | POA: Diagnosis not present

## 2016-08-08 ENCOUNTER — Encounter: Payer: Self-pay | Admitting: Family Medicine

## 2016-08-08 MED ORDER — AMPHETAMINE-DEXTROAMPHET ER 10 MG PO CP24: 10.0000 mg | ORAL_CAPSULE | Freq: Every day | ORAL | 0 refills | Status: DC

## 2016-08-08 NOTE — Telephone Encounter (Signed)
Last OV 03/05/16 (obesity) adderall last filled 07/13/16 #30 with 0

## 2016-08-17 ENCOUNTER — Ambulatory Visit (INDEPENDENT_AMBULATORY_CARE_PROVIDER_SITE_OTHER): Payer: BC Managed Care – PPO | Admitting: Psychology

## 2016-08-17 DIAGNOSIS — F4320 Adjustment disorder, unspecified: Secondary | ICD-10-CM | POA: Diagnosis not present

## 2016-08-29 ENCOUNTER — Ambulatory Visit (INDEPENDENT_AMBULATORY_CARE_PROVIDER_SITE_OTHER): Payer: BC Managed Care – PPO | Admitting: Psychology

## 2016-08-29 DIAGNOSIS — F4320 Adjustment disorder, unspecified: Secondary | ICD-10-CM

## 2016-08-31 ENCOUNTER — Encounter: Payer: Self-pay | Admitting: Internal Medicine

## 2016-08-31 ENCOUNTER — Other Ambulatory Visit: Payer: Self-pay | Admitting: Obstetrics and Gynecology

## 2016-08-31 DIAGNOSIS — Z1231 Encounter for screening mammogram for malignant neoplasm of breast: Secondary | ICD-10-CM

## 2016-09-12 ENCOUNTER — Encounter: Payer: Self-pay | Admitting: Family Medicine

## 2016-09-12 MED ORDER — AMPHETAMINE-DEXTROAMPHET ER 10 MG PO CP24: 10.0000 mg | ORAL_CAPSULE | Freq: Every day | ORAL | 0 refills | Status: DC

## 2016-09-12 NOTE — Telephone Encounter (Signed)
Last OV 03/05/16 adderall last filled 08/08/16 #30 with 0

## 2016-09-28 ENCOUNTER — Ambulatory Visit: Payer: BC Managed Care – PPO | Admitting: Psychology

## 2016-10-09 ENCOUNTER — Ambulatory Visit: Payer: BC Managed Care – PPO | Admitting: Psychology

## 2016-10-10 ENCOUNTER — Encounter: Payer: Self-pay | Admitting: General Practice

## 2016-10-10 ENCOUNTER — Encounter: Payer: Self-pay | Admitting: Family Medicine

## 2016-10-10 MED ORDER — AMPHETAMINE-DEXTROAMPHET ER 10 MG PO CP24: 10.0000 mg | ORAL_CAPSULE | Freq: Every day | ORAL | 0 refills | Status: DC

## 2016-10-10 NOTE — Telephone Encounter (Signed)
Last OV 03/05/16 adderall last filled 09/12/16 #30 with 0

## 2016-10-10 NOTE — Telephone Encounter (Signed)
Not sure if a letter for Adderall will be of any assistance if it truly is illegal in [place].  I am happy to provide a letter that she takes this regularly for medical purposes, but if it is truly against the law, not sure it will help.

## 2016-10-11 ENCOUNTER — Encounter: Payer: Self-pay | Admitting: Family Medicine

## 2016-10-11 ENCOUNTER — Ambulatory Visit (INDEPENDENT_AMBULATORY_CARE_PROVIDER_SITE_OTHER): Payer: BC Managed Care – PPO | Admitting: Family Medicine

## 2016-10-11 VITALS — BP 121/83 | HR 82 | Temp 98.2°F | Resp 16 | Ht 63.0 in | Wt 151.1 lb

## 2016-10-11 DIAGNOSIS — J0100 Acute maxillary sinusitis, unspecified: Secondary | ICD-10-CM

## 2016-10-11 MED ORDER — AZITHROMYCIN 250 MG PO TABS: ORAL_TABLET | ORAL | 0 refills | Status: DC

## 2016-10-11 NOTE — Patient Instructions (Signed)
Follow up as needed/scheduled Start the Zpack as directed Drink plenty of fluids Mucinex DM or Delsym as needed for cough REST! Call with any questions or concerns Hang in there!! Have a great trip!!

## 2016-10-11 NOTE — Progress Notes (Signed)
Pre visit review using our clinic review tool, if applicable. No additional management support is needed unless otherwise documented below in the visit note. 

## 2016-10-11 NOTE — Assessment & Plan Note (Signed)
Pt's sxs and PE consistent w/ infxn.  Despite allergy to erythromycin pt has taken Zpacks in the past w/o difficulty.  Start abx.  Reviewed supportive care and red flags that should prompt return.  Pt expressed understanding and is in agreement w/ plan.

## 2016-10-11 NOTE — Progress Notes (Signed)
   Subjective:    Patient ID: Abigail Singh, female    DOB: May 15, 1975, 41 y.o.   MRN: [id number]  HPI URI- sxs started ~10 days ago.  + sick contacts.  Pt thought she was getting better but then things worsened again.  Now having R ear pain.  + sinus pain/pressure- slept w/ ice pack on face last night.  Taking benadryl x1 week.  Having bloody nasal drainage.  + fevers early on- continues to have chills and sweats.  + cough- sporadic- productive.   Review of Systems For ROS see HPI     Objective:   Physical Exam  Constitutional: She appears well-developed and well-nourished. No distress.  HENT:  Head: Normocephalic and atraumatic.  Right Ear: Tympanic membrane normal.  Left Ear: Tympanic membrane normal.  Nose: Mucosal edema and rhinorrhea present. Right sinus exhibits maxillary sinus tenderness. Right sinus exhibits no frontal sinus tenderness. Left sinus exhibits maxillary sinus tenderness. Left sinus exhibits no frontal sinus tenderness.  Mouth/Throat: Uvula is midline and mucous membranes are normal. Posterior oropharyngeal erythema present. No oropharyngeal exudate.  Eyes: Conjunctivae and EOM are normal. Pupils are equal, round, and reactive to light.  Neck: Normal range of motion. Neck supple.  Cardiovascular: Normal rate, regular rhythm and normal heart sounds.   Pulmonary/Chest: Effort normal and breath sounds normal. No respiratory distress. She has no wheezes.  Lymphadenopathy:    She has no cervical adenopathy.  Vitals reviewed.         Assessment & Plan:

## 2016-10-12 ENCOUNTER — Ambulatory Visit (INDEPENDENT_AMBULATORY_CARE_PROVIDER_SITE_OTHER): Payer: BC Managed Care – PPO | Admitting: Psychology

## 2016-10-12 DIAGNOSIS — F4320 Adjustment disorder, unspecified: Secondary | ICD-10-CM | POA: Diagnosis not present

## 2016-10-14 ENCOUNTER — Encounter: Payer: Self-pay | Admitting: Family Medicine

## 2016-10-15 MED ORDER — FLUCONAZOLE 150 MG PO TABS: 150.0000 mg | ORAL_TABLET | Freq: Once | ORAL | 0 refills | Status: AC

## 2016-10-29 ENCOUNTER — Encounter: Payer: Self-pay | Admitting: Internal Medicine

## 2016-10-29 ENCOUNTER — Ambulatory Visit (AMBULATORY_SURGERY_CENTER): Payer: Self-pay | Admitting: *Deleted

## 2016-10-29 VITALS — Ht 63.5 in | Wt 152.8 lb

## 2016-10-29 DIAGNOSIS — Z86010 Personal history of colonic polyps: Secondary | ICD-10-CM

## 2016-10-29 MED ORDER — NA SULFATE-K SULFATE-MG SULF 17.5-3.13-1.6 GM/177ML PO SOLN: ORAL | 0 refills | Status: DC

## 2016-10-29 NOTE — Progress Notes (Signed)
Pt has allergies to eggs, but takes flu shots. Denies any issues regarding respiratory . Denies difficulty with sedation or anesthesia. Denies any diet or weight loss medications. Denies use of supplemental oxygen.  Emmi instructions given for procedure.

## 2016-10-31 LAB — HM PAP SMEAR

## 2016-10-31 LAB — HM MAMMOGRAPHY: HM MAMMO: NORMAL (ref 0–4)

## 2016-11-01 ENCOUNTER — Ambulatory Visit: Payer: Self-pay

## 2016-11-05 ENCOUNTER — Ambulatory Visit (INDEPENDENT_AMBULATORY_CARE_PROVIDER_SITE_OTHER): Payer: BC Managed Care – PPO | Admitting: Psychology

## 2016-11-05 DIAGNOSIS — F4320 Adjustment disorder, unspecified: Secondary | ICD-10-CM | POA: Diagnosis not present

## 2016-11-12 ENCOUNTER — Encounter: Payer: Self-pay | Admitting: Internal Medicine

## 2016-11-12 ENCOUNTER — Ambulatory Visit (AMBULATORY_SURGERY_CENTER): Payer: BC Managed Care – PPO | Admitting: Internal Medicine

## 2016-11-12 VITALS — BP 107/74 | HR 49 | Temp 96.8°F | Resp 12 | Ht 63.5 in | Wt 152.0 lb

## 2016-11-12 DIAGNOSIS — D122 Benign neoplasm of ascending colon: Secondary | ICD-10-CM | POA: Diagnosis not present

## 2016-11-12 DIAGNOSIS — Z86010 Personal history of colonic polyps: Secondary | ICD-10-CM | POA: Diagnosis not present

## 2016-11-12 DIAGNOSIS — D125 Benign neoplasm of sigmoid colon: Secondary | ICD-10-CM

## 2016-11-12 MED ORDER — SODIUM CHLORIDE 0.9 % IV SOLN: 500.0000 mL | INTRAVENOUS | Status: DC

## 2016-11-12 NOTE — Progress Notes (Signed)
To PACU VSS, Report to Rn

## 2016-11-12 NOTE — Progress Notes (Signed)
Called to room to assist during endoscopic procedure.  Patient ID and intended procedure confirmed with present staff. Received instructions for my participation in the procedure from the performing physician.  

## 2016-11-12 NOTE — Progress Notes (Signed)
Pt's states no medical or surgical changes since previsit or office visit.  No soy allergy- no true egg allergy just sensitive to eggs- take flu shot with no issues, etas egg containg products with no problems

## 2016-11-12 NOTE — Op Note (Signed)
[facility] Patient Name: Abigail Singh Procedure Date: 11/12/2016 8:53 AM MRN: [id number] Endoscopist: Docia Chuck. Henrene Pastor , MD Age: 41 Referring MD:  Date of Birth: 09-Mar-1976 Gender: Female Account #: [id number] Procedure:                Colonoscopy, with snare polypectomy x 2 Indications:              High risk colon cancer surveillance: Personal                            history of non-advanced adenoma, High risk colon                            cancer surveillance: Personal history of sessile                            serrated colon polyp (10 mm or greater in size).                            Previous examinations 2007, 2012, and 2014 Medicines:                Monitored Anesthesia Care Procedure:                Pre-Anesthesia Assessment:                           - Prior to the procedure, a History and Physical                            was performed, and patient medications and                            allergies were reviewed. The patient's tolerance of                            previous anesthesia was also reviewed. The risks                            and benefits of the procedure and the sedation                            options and risks were discussed with the patient.                            All questions were answered, and informed consent                            was obtained. Prior Anticoagulants: The patient has                            taken no previous anticoagulant or antiplatelet                            agents. ASA Grade Assessment: II - A patient with  mild systemic disease. After reviewing the risks                            and benefits, the patient was deemed in                            satisfactory condition to undergo the procedure.                           After obtaining informed consent, the colonoscope                            was passed under direct vision. Throughout the   procedure, the patient's blood pressure, pulse, and                            oxygen saturations were monitored continuously. The                            Colonoscope was introduced through the anus and                            advanced to the the cecum, identified by                            appendiceal orifice and ileocecal valve. The                            ileocecal valve, appendiceal orifice, and rectum                            were photographed. The quality of the bowel                            preparation was excellent. The colonoscopy was                            performed without difficulty. The patient tolerated                            the procedure well. The bowel preparation used was                            SUPREP. Scope In: 9:09:08 AM Scope Out: 9:25:42 AM Scope Withdrawal Time: 0 hours 13 minutes 18 seconds  Total Procedure Duration: 0 hours 16 minutes 34 seconds  Findings:                 Two sessile polyps were found in the sigmoid colon                            and ascending colon. The polyps were 3 to 4 mm in                            size. These  polyps were removed with a cold snare.                            Resection and retrieval were complete.                           Internal hemorrhoids were found during retroflexion.                           The exam was otherwise without abnormality on                            direct and retroflexion views. Complications:            No immediate complications. Estimated blood loss:                            None. Estimated Blood Loss:     Estimated blood loss: none. Impression:               - Two 3 to 4 mm polyps in the sigmoid colon and in                            the ascending colon, removed with a cold snare.                            Resected and retrieved.                           - Internal hemorrhoids.                           - The examination was otherwise normal on direct                             and retroflexion views. Recommendation:           - Repeat colonoscopy in 3 years for surveillance.                           - Patient has a contact number available for                            emergencies. The signs and symptoms of potential                            delayed complications were discussed with the                            patient. Return to normal activities tomorrow.                            Written discharge instructions were provided to the                            patient.                           -  Resume previous diet.                           - Continue present medications.                           - Await pathology results. Docia Chuck. Henrene Pastor, MD 11/12/2016 9:30:58 AM This report has been signed electronically.

## 2016-11-12 NOTE — Patient Instructions (Signed)
YOU HAD AN ENDOSCOPIC PROCEDURE TODAY AT THE [facility]:   Refer to the procedure report that was given to you for any specific questions about what was found during the examination.  If the procedure report does not answer your questions, please call your gastroenterologist to clarify.  If you requested that your care partner not be given the details of your procedure findings, then the procedure report has been included in a sealed envelope for you to review at your convenience later.  YOU SHOULD EXPECT: Some feelings of bloating in the abdomen. Passage of more gas than usual.  Walking can help get rid of the air that was put into your GI tract during the procedure and reduce the bloating. If you had a lower endoscopy (such as a colonoscopy or flexible sigmoidoscopy) you may notice spotting of blood in your stool or on the toilet paper. If you underwent a bowel prep for your procedure, you may not have a normal bowel movement for a few days.  Please Note:  You might notice some irritation and congestion in your nose or some drainage.  This is from the oxygen used during your procedure.  There is no need for concern and it should clear up in a day or so.  SYMPTOMS TO REPORT IMMEDIATELY:   Following lower endoscopy (colonoscopy or flexible sigmoidoscopy):  Excessive amounts of blood in the stool  Significant tenderness or worsening of abdominal pains  Swelling of the abdomen that is new, acute  Fever of 100F or higher   For urgent or emergent issues, a gastroenterologist can be reached at any hour by calling [phone]. Please read all handouts given to you by your recovery nurse.   DIET:  We do recommend a small meal at first, but then you may proceed to your regular diet.  Drink plenty of fluids but you should avoid alcoholic beverages for 24 hours.  ACTIVITY:  You should plan to take it easy for the rest of today and you should NOT DRIVE or use heavy machinery until  tomorrow (because of the sedation medicines used during the test).    FOLLOW UP: Our staff will call the number listed on your records the next business day following your procedure to check on you and address any questions or concerns that you may have regarding the information given to you following your procedure. If we do not reach you, we will leave a message.  However, if you are feeling well and you are not experiencing any problems, there is no need to return our call.  We will assume that you have returned to your regular daily activities without incident.  If any biopsies were taken you will be contacted by phone or by letter within the next 1-3 weeks.  Please call us at [phone] if you have not heard about the biopsies in 3 weeks.    SIGNATURES/CONFIDENTIALITY: You and/or your care partner have signed paperwork which will be entered into your electronic medical record.  These signatures attest to the fact that that the information above on your After Visit Summary has been reviewed and is understood.  Full responsibility of the confidentiality of this discharge information lies with you and/or your care-partner.  Thank you for letting us take care of your healthcare needs today. 

## 2016-11-13 ENCOUNTER — Telehealth: Payer: Self-pay

## 2016-11-13 ENCOUNTER — Telehealth: Payer: Self-pay | Admitting: *Deleted

## 2016-11-13 NOTE — Telephone Encounter (Signed)
  Follow up Call-  Call back number 11/12/2016  Post procedure Call Back phone  # [phone]  Permission to leave phone message Yes  Some recent data might be hidden     Patient questions:  Left message to call us if necessary.

## 2016-11-13 NOTE — Telephone Encounter (Signed)
  Follow up Call-  Call back number 11/12/2016  Post procedure Call Back phone  # [phone]  Permission to leave phone message Yes  Some recent data might be hidden     Patient questions:  Do you have a fever, pain , or abdominal swelling? No. Pain Score  0 *  Have you tolerated food without any problems? Yes.    Have you been able to return to your normal activities? Yes.    Do you have any questions about your discharge instructions: Diet   No. Medications  No. Follow up visit  No.  Do you have questions or concerns about your Care? No.  Actions: * If pain score is 4 or above: No action needed, pain <4.

## 2016-11-15 ENCOUNTER — Encounter: Payer: Self-pay | Admitting: Internal Medicine

## 2016-11-16 ENCOUNTER — Encounter: Payer: Self-pay | Admitting: Family Medicine

## 2016-11-16 MED ORDER — AMPHETAMINE-DEXTROAMPHET ER 10 MG PO CP24
10.0000 mg | ORAL_CAPSULE | Freq: Every day | ORAL | 0 refills | Status: DC
Start: 2016-11-16 — End: 2016-11-26

## 2016-11-16 NOTE — Telephone Encounter (Signed)
Pt made aware available at front desk for pick up.

## 2016-11-16 NOTE — Telephone Encounter (Signed)
Last OV 10/11/16 adderall last filled 10/10/16 #30 with 0

## 2016-11-23 ENCOUNTER — Ambulatory Visit (INDEPENDENT_AMBULATORY_CARE_PROVIDER_SITE_OTHER): Payer: BC Managed Care – PPO | Admitting: Psychology

## 2016-11-23 DIAGNOSIS — F4320 Adjustment disorder, unspecified: Secondary | ICD-10-CM | POA: Diagnosis not present

## 2016-11-26 ENCOUNTER — Encounter: Payer: Self-pay | Admitting: Family Medicine

## 2016-11-26 ENCOUNTER — Telehealth: Payer: Self-pay | Admitting: General Practice

## 2016-11-26 ENCOUNTER — Ambulatory Visit (INDEPENDENT_AMBULATORY_CARE_PROVIDER_SITE_OTHER): Payer: BC Managed Care – PPO | Admitting: Family Medicine

## 2016-11-26 VITALS — BP 110/78 | HR 66 | Temp 98.1°F | Resp 16 | Ht 64.0 in | Wt 152.1 lb

## 2016-11-26 DIAGNOSIS — E010 Iodine-deficiency related diffuse (endemic) goiter: Secondary | ICD-10-CM | POA: Diagnosis not present

## 2016-11-26 DIAGNOSIS — E559 Vitamin D deficiency, unspecified: Secondary | ICD-10-CM | POA: Diagnosis not present

## 2016-11-26 DIAGNOSIS — G4700 Insomnia, unspecified: Secondary | ICD-10-CM | POA: Diagnosis not present

## 2016-11-26 DIAGNOSIS — F900 Attention-deficit hyperactivity disorder, predominantly inattentive type: Secondary | ICD-10-CM | POA: Diagnosis not present

## 2016-11-26 DIAGNOSIS — Z0000 Encounter for general adult medical examination without abnormal findings: Secondary | ICD-10-CM

## 2016-11-26 DIAGNOSIS — F909 Attention-deficit hyperactivity disorder, unspecified type: Secondary | ICD-10-CM | POA: Insufficient documentation

## 2016-11-26 LAB — CBC WITH DIFFERENTIAL/PLATELET
BASOS PCT: 0.6 % (ref 0.0–3.0)
Basophils Absolute: 0 10*3/uL (ref 0.0–0.1)
EOS ABS: 0.1 10*3/uL (ref 0.0–0.7)
EOS PCT: 2.5 % (ref 0.0–5.0)
HCT: 39.7 % (ref 36.0–46.0)
Hemoglobin: 13 g/dL (ref 12.0–15.0)
LYMPHS ABS: 2.1 10*3/uL (ref 0.7–4.0)
Lymphocytes Relative: 38 % (ref 12.0–46.0)
MCHC: 32.7 g/dL (ref 30.0–36.0)
MCV: 89.8 fl (ref 78.0–100.0)
MONO ABS: 0.4 10*3/uL (ref 0.1–1.0)
Monocytes Relative: 7.9 % (ref 3.0–12.0)
NEUTROS PCT: 51 % (ref 43.0–77.0)
Neutro Abs: 2.8 10*3/uL (ref 1.4–7.7)
Platelets: 213 10*3/uL (ref 150.0–400.0)
RBC: 4.42 Mil/uL (ref 3.87–5.11)
RDW: 13.6 % (ref 11.5–15.5)
WBC: 5.5 10*3/uL (ref 4.0–10.5)

## 2016-11-26 LAB — BASIC METABOLIC PANEL
BUN: 12 mg/dL (ref 6–23)
CHLORIDE: 106 meq/L (ref 96–112)
CO2: 26 meq/L (ref 19–32)
Calcium: 8.6 mg/dL (ref 8.4–10.5)
Creatinine, Ser: 0.71 mg/dL (ref 0.40–1.20)
GFR: 96.48 mL/min (ref >60.00)
Glucose, Bld: 90 mg/dL (ref 70–99)
POTASSIUM: 3.9 meq/L (ref 3.5–5.1)
Sodium: 138 mEq/L (ref 135–145)

## 2016-11-26 LAB — HEPATIC FUNCTION PANEL
ALBUMIN: 3.9 g/dL (ref 3.5–5.2)
ALK PHOS: 60 U/L (ref 39–117)
ALT: 7 U/L (ref 0–35)
AST: 11 U/L (ref 0–37)
Bilirubin, Direct: 0.1 mg/dL (ref 0.0–0.3)
Total Bilirubin: 0.3 mg/dL (ref 0.2–1.2)
Total Protein: 6.4 g/dL (ref 6.0–8.3)

## 2016-11-26 LAB — LIPID PANEL
CHOL/HDL RATIO: 3
CHOLESTEROL: 136 mg/dL (ref 0–200)
HDL: 42.7 mg/dL (ref >39.00)
LDL Cholesterol: 78 mg/dL (ref 0–99)
NonHDL: 93.72
TRIGLYCERIDES: 79 mg/dL (ref 0.0–149.0)
VLDL: 15.8 mg/dL (ref 0.0–40.0)

## 2016-11-26 LAB — VITAMIN D 25 HYDROXY (VIT D DEFICIENCY, FRACTURES): VITD: 25.64 ng/mL — AB (ref 30.00–100.00)

## 2016-11-26 LAB — TSH: TSH: 1.19 u[IU]/mL (ref 0.35–4.50)

## 2016-11-26 MED ORDER — SUVOREXANT 10 MG PO TABS: 1.0000 | ORAL_TABLET | Freq: Every day | ORAL | 3 refills | Status: DC

## 2016-11-26 MED ORDER — AMPHETAMINE-DEXTROAMPHET ER 15 MG PO CP24
15.0000 mg | ORAL_CAPSULE | ORAL | 0 refills | Status: DC
Start: 2016-11-26 — End: 2017-01-14

## 2016-11-26 NOTE — Telephone Encounter (Signed)
PA for Belsomra began today in covermymeds [facility])

## 2016-11-26 NOTE — Assessment & Plan Note (Signed)
Pt is not getting relief w/ OTC sleep aides but is hesitant to restart Ambien b/c she is fearful of not being able to wake up if needed.  Will start Belsomra and monitor for improvement.

## 2016-11-26 NOTE — Progress Notes (Signed)
   Subjective:    Patient ID: Abigail Singh, female    DOB: 11/20/1975, 41 y.o.   MRN: [id number]  HPI CPE- UTD on pap, mammo.  UTD on Tdap.     Review of Systems Patient reports no vision/ hearing changes, adenopathy,fever, weight change,  persistant/recurrent hoarseness , swallowing issues, chest pain, palpitations, edema, persistant/recurrent cough, hemoptysis, dyspnea (rest/exertional/paroxysmal nocturnal), gastrointestinal bleeding (melena, rectal bleeding), abdominal pain, significant heartburn, bowel changes, GU symptoms (dysuria, hematuria, incontinence), Gyn symptoms (abnormal  bleeding, pain),  syncope, focal weakness, memory loss, numbness & tingling, skin/hair/nail changes, abnormal bruising or bleeding, anxiety, or depression.   Pt feels she needs to increase dose of Adderall after discussion w/ Dr Cheryln Manly Insomnia- pt previously took Ambien for sleep but stopped after having kids.  Will take Tylenol PM or benadryl but will only get 4 hrs.    Objective:   Physical Exam General Appearance:    Alert, cooperative, no distress, appears stated age  Head:    Normocephalic, without obvious abnormality, atraumatic  Eyes:    PERRL, conjunctiva/corneas clear, EOM's intact, fundi    benign, both eyes  Ears:    Normal TM's and external ear canals, both ears  Nose:   Nares normal, septum midline, mucosa normal, no drainage    or sinus tenderness  Throat:   Lips, mucosa, and tongue normal; teeth and gums normal  Neck:   Supple, symmetrical, trachea midline, no adenopathy;    Thyroid: mild enlargement  Back:     Symmetric, no curvature, ROM normal, no CVA tenderness  Lungs:     Clear to auscultation bilaterally, respirations unlabored  Chest Wall:    No tenderness or deformity   Heart:    Regular rate and rhythm, S1 and S2 normal, no murmur, rub   or gallop  Breast Exam:    Deferred to GYN  Abdomen:     Soft, non-tender, bowel sounds active all four quadrants,    no masses, no  organomegaly  Genitalia:    Deferred to GYN  Rectal:    Extremities:   Extremities normal, atraumatic, no cyanosis or edema  Pulses:   2+ and symmetric all extremities  Skin:   Skin color, texture, turgor normal, no rashes or lesions  Lymph nodes:   Cervical, supraclavicular, and axillary nodes normal  Neurologic:   CNII-XII intact, normal strength, sensation and reflexes    throughout          Assessment & Plan:

## 2016-11-26 NOTE — Assessment & Plan Note (Signed)
Pt's PE unchanged from previous and WNL.  UTD on GYN and immunizations.  Check labs.  Anticipatory guidance provided.

## 2016-11-26 NOTE — Progress Notes (Signed)
Pre visit review using our clinic review tool, if applicable. No additional management support is needed unless otherwise documented below in the visit note. 

## 2016-11-26 NOTE — Patient Instructions (Signed)
Follow up in 6 months to recheck ADHD medication We'll notify you of your lab results and make any changes if needed Increase the Adderall from 10mg  --> 15mg  daily Use the Belsomra nightly to help w/ sleep (coupon card provided) Please let me know if either medication is not working Call with any questions or concerns Enjoy the rest of your summer!!!

## 2016-11-26 NOTE — Telephone Encounter (Signed)
PA for Belsomra was approved from 11/26/16 until 11/27/2019.   Pt pharmacy informed.

## 2016-11-26 NOTE — Assessment & Plan Note (Signed)
Pt has had multiple stable ultrasounds.  Check labs to determine if thyroid abnormality.

## 2016-11-26 NOTE — Assessment & Plan Note (Signed)
Pt reports feeling well but continues to have excessive exhaustion despite stimulants.  This may be due to her poor sleep.  Dr Cheryln Manly had mentioned increasing Adderall dose- will increase to 15mg  daily and monitor for improvement.  Pt expressed understanding and is in agreement w/ plan.

## 2016-11-26 NOTE — Assessment & Plan Note (Signed)
Noted on previous labs.  Check levels and replete prn.

## 2016-11-27 ENCOUNTER — Encounter: Payer: Self-pay | Admitting: Family Medicine

## 2016-11-27 ENCOUNTER — Other Ambulatory Visit: Payer: Self-pay | Admitting: General Practice

## 2016-11-27 MED ORDER — VITAMIN D (ERGOCALCIFEROL) 1.25 MG (50000 UNIT) PO CAPS: 50000.0000 [IU] | ORAL_CAPSULE | ORAL | 0 refills | Status: DC

## 2016-12-21 ENCOUNTER — Ambulatory Visit (INDEPENDENT_AMBULATORY_CARE_PROVIDER_SITE_OTHER): Payer: BC Managed Care – PPO | Admitting: Psychology

## 2016-12-21 DIAGNOSIS — F4320 Adjustment disorder, unspecified: Secondary | ICD-10-CM | POA: Diagnosis not present

## 2016-12-31 ENCOUNTER — Ambulatory Visit (INDEPENDENT_AMBULATORY_CARE_PROVIDER_SITE_OTHER): Payer: BC Managed Care – PPO | Admitting: Psychology

## 2016-12-31 DIAGNOSIS — F4320 Adjustment disorder, unspecified: Secondary | ICD-10-CM | POA: Diagnosis not present

## 2017-01-13 ENCOUNTER — Other Ambulatory Visit: Payer: Self-pay | Admitting: Family Medicine

## 2017-01-13 ENCOUNTER — Encounter: Payer: Self-pay | Admitting: Family Medicine

## 2017-01-13 DIAGNOSIS — G43009 Migraine without aura, not intractable, without status migrainosus: Secondary | ICD-10-CM

## 2017-01-14 MED ORDER — AMPHETAMINE-DEXTROAMPHET ER 15 MG PO CP24: 15.0000 mg | ORAL_CAPSULE | ORAL | 0 refills | Status: DC

## 2017-01-14 NOTE — Telephone Encounter (Signed)
Last OV 11/26/16 adderall last filled 11/26/16 #30 with 0  No CSC on file

## 2017-01-14 NOTE — Telephone Encounter (Signed)
Will refill but needs CSC since we are now filling this regularly

## 2017-01-18 ENCOUNTER — Ambulatory Visit (INDEPENDENT_AMBULATORY_CARE_PROVIDER_SITE_OTHER): Payer: BC Managed Care – PPO | Admitting: Psychology

## 2017-01-18 DIAGNOSIS — F4320 Adjustment disorder, unspecified: Secondary | ICD-10-CM | POA: Diagnosis not present

## 2017-01-23 ENCOUNTER — Encounter: Payer: Self-pay | Admitting: Family Medicine

## 2017-01-23 ENCOUNTER — Ambulatory Visit (INDEPENDENT_AMBULATORY_CARE_PROVIDER_SITE_OTHER): Payer: BC Managed Care – PPO | Admitting: Family Medicine

## 2017-01-23 VITALS — BP 102/72 | HR 73 | Temp 98.0°F | Resp 16 | Ht 64.0 in | Wt 151.5 lb

## 2017-01-23 DIAGNOSIS — B353 Tinea pedis: Secondary | ICD-10-CM | POA: Diagnosis not present

## 2017-01-23 DIAGNOSIS — Z23 Encounter for immunization: Secondary | ICD-10-CM

## 2017-01-23 MED ORDER — CLOTRIMAZOLE-BETAMETHASONE 1-0.05 % EX CREA: 1.0000 "application " | TOPICAL_CREAM | Freq: Two times a day (BID) | CUTANEOUS | 0 refills | Status: DC

## 2017-01-23 NOTE — Progress Notes (Signed)
   Subjective:    Patient ID: Abigail Singh, female    DOB: [birth year], 41 y.o.   MRN: [id number]  HPI Foot issue- pt reports she has 'little dots' on her feet bilaterally.  Has brittle toe nails.  Using Tinactin 'for awhile now'.  After using the Tinactin, she is having cracking skin on the soles of both feet.  She is very distressed by this.   Review of Systems For ROS see HPI     Objective:   Physical Exam  Constitutional: She is oriented to person, place, and time. She appears well-developed and well-nourished. No distress.  HENT:  Head: Normocephalic and atraumatic.  Neurological: She is alert and oriented to person, place, and time.  Skin: Skin is warm and dry.  Cracking of skin on soles bilaterally w/ erythematous macules along peeling edges.  Brittle toe nails bilaterally  Psychiatric: She has a normal mood and affect. Her behavior is normal. Thought content normal.  Vitals reviewed.         Assessment & Plan:  Tinea pedis- new.  Pt's sxs and PE consistent w/ fungal infxn.  Start Lotrisone cream twice daily to improve dryness, cracking and fungal infxn.  OTC Funginails on the nails.  Reviewed supportive care and red flags that should prompt return.  Pt expressed understanding and is in agreement w/ plan.

## 2017-01-23 NOTE — Patient Instructions (Signed)
Follow up as needed or as scheduled Start the Lotrisone cream twice daily Use OTC Funginail to improve nail quality Call with any questions or concerns Hang in there!! Happy [birth year] Birthday!!!

## 2017-01-23 NOTE — Progress Notes (Signed)
Pre visit review using our clinic review tool, if applicable. No additional management support is needed unless otherwise documented below in the visit note. 

## 2017-01-28 ENCOUNTER — Ambulatory Visit (INDEPENDENT_AMBULATORY_CARE_PROVIDER_SITE_OTHER): Payer: BC Managed Care – PPO | Admitting: Psychology

## 2017-01-28 DIAGNOSIS — F4320 Adjustment disorder, unspecified: Secondary | ICD-10-CM

## 2017-02-11 ENCOUNTER — Ambulatory Visit (INDEPENDENT_AMBULATORY_CARE_PROVIDER_SITE_OTHER): Payer: BC Managed Care – PPO | Admitting: Psychology

## 2017-02-11 DIAGNOSIS — F4320 Adjustment disorder, unspecified: Secondary | ICD-10-CM | POA: Diagnosis not present

## 2017-02-13 ENCOUNTER — Encounter: Payer: Self-pay | Admitting: Family Medicine

## 2017-02-14 MED ORDER — AMPHETAMINE-DEXTROAMPHET ER 15 MG PO CP24: 15.0000 mg | ORAL_CAPSULE | ORAL | 0 refills | Status: DC

## 2017-02-14 NOTE — Telephone Encounter (Signed)
Bruno for refill but needs CSC and UDS.  Thanks!

## 2017-02-14 NOTE — Telephone Encounter (Signed)
Last OV 01/23/17 adderall last filled 01/14/17 #30 with 0  NO CSC or UDS

## 2017-02-15 ENCOUNTER — Other Ambulatory Visit: Payer: Self-pay | Admitting: Family Medicine

## 2017-02-25 ENCOUNTER — Ambulatory Visit: Payer: Self-pay | Admitting: Psychology

## 2017-03-01 ENCOUNTER — Ambulatory Visit (INDEPENDENT_AMBULATORY_CARE_PROVIDER_SITE_OTHER): Payer: BC Managed Care – PPO | Admitting: Psychology

## 2017-03-01 DIAGNOSIS — F4320 Adjustment disorder, unspecified: Secondary | ICD-10-CM

## 2017-03-11 ENCOUNTER — Ambulatory Visit (INDEPENDENT_AMBULATORY_CARE_PROVIDER_SITE_OTHER): Payer: BC Managed Care – PPO | Admitting: Psychology

## 2017-03-11 DIAGNOSIS — F4320 Adjustment disorder, unspecified: Secondary | ICD-10-CM | POA: Diagnosis not present

## 2017-03-13 ENCOUNTER — Telehealth: Payer: Self-pay | Admitting: Family Medicine

## 2017-03-13 ENCOUNTER — Other Ambulatory Visit: Payer: Self-pay

## 2017-03-13 ENCOUNTER — Encounter: Payer: Self-pay | Admitting: Physician Assistant

## 2017-03-13 ENCOUNTER — Other Ambulatory Visit: Payer: Self-pay | Admitting: Family Medicine

## 2017-03-13 ENCOUNTER — Ambulatory Visit: Payer: BC Managed Care – PPO | Admitting: Physician Assistant

## 2017-03-13 VITALS — BP 100/78 | HR 98 | Temp 98.0°F | Resp 14 | Ht 64.0 in | Wt 157.0 lb

## 2017-03-13 DIAGNOSIS — G43009 Migraine without aura, not intractable, without status migrainosus: Secondary | ICD-10-CM

## 2017-03-13 DIAGNOSIS — J019 Acute sinusitis, unspecified: Secondary | ICD-10-CM

## 2017-03-13 DIAGNOSIS — B9689 Other specified bacterial agents as the cause of diseases classified elsewhere: Secondary | ICD-10-CM | POA: Diagnosis not present

## 2017-03-13 MED ORDER — AMPHETAMINE-DEXTROAMPHET ER 15 MG PO CP24: 15.0000 mg | ORAL_CAPSULE | ORAL | 0 refills | Status: DC

## 2017-03-13 MED ORDER — DOXYCYCLINE HYCLATE 100 MG PO CAPS: 100.0000 mg | ORAL_CAPSULE | Freq: Two times a day (BID) | ORAL | 0 refills | Status: DC

## 2017-03-13 NOTE — Progress Notes (Signed)
Patient presents to clinic today c/o almost 3 weeks of sinus congestion, sore throat, chills and now with chest congestion and productive cough. Has noted R ear pressure/pain and maxillary pain. Has not checked for fever with a thermometer but has felt feverish. Has noted thicker nasal drainage. Has history of significant for yearly sinus infections. Notes this feels similar. Has been taking Nyquil/Dayquil for symptom relief that helps somewhat. .   Past Medical History:  Diagnosis Date  . Allergy    sseasonal  . Anemia    as teenager  . Anxiety   . Depression   . Fibroid   . Fibromyalgia   . Heartburn in pregnancy   . History of fibromyalgia   . Migraines   . Neuromuscular disorder (HCC)    fibromyalgia  . Postpartum care following cesarean delivery 03/17/2012  . Preterm labor   . Status post cesarean delivery (repeat x 2) 03/17/2012  . Urinary tract infection     Current Outpatient Medications on File Prior to Visit  Medication Sig Dispense Refill  . acetaminophen (TYLENOL) 325 MG tablet Take 650 mg by mouth every 6 (six) hours as needed.    Marland Kitchen amphetamine-dextroamphetamine (ADDERALL XR) 15 MG 24 hr capsule Take 1 capsule by mouth every morning. 30 capsule 0  . CAMILA 0.35 MG tablet     . Cholecalciferol (VITAMIN D) 2000 UNITS CAPS Take 2,000 Units by mouth daily.    . clotrimazole-betamethasone (LOTRISONE) cream Apply 1 application topically 2 (two) times daily. 45 g 0  . Suvorexant (BELSOMRA) 10 MG TABS Take 1 tablet by mouth at bedtime. 30 tablet 3  . topiramate (TOPAMAX) 50 MG tablet TAKE 1 TABLET BY MOUTH TWICE A DAY 60 tablet 3   No current facility-administered medications on file prior to visit.     Allergies  Allergen Reactions  . Aspirin Anaphylaxis    Pt reports being able to take ibuprofen with no problems.  . Codeine Other (See Comments)    halucinations  . Eggs Or Egg-Derived Products Other (See Comments)    Patient states ok with flu shots, etc. No  anaphalaxis.  Eggs give stomach cramps with diarrhea. After diarrhea ok.-pt eats baked goods that contains eggs with no issues   . Erythromycin Rash  . Lactose Intolerance (Gi) Diarrhea    Diarrhea with lactose.  Marland Kitchen Penicillins Rash  . Sulfonamide Derivatives Rash    Childhood reaction, has taken recently with only stomach irritation.    Family History  Problem Relation Age of Onset  . Diabetes Mother   . Diabetes Father   . Diabetes Maternal Grandmother   . Colon polyps Maternal Grandmother   . Diabetes Maternal Grandfather   . Colon polyps Maternal Grandfather   . Heart disease Maternal Grandfather   . Colon cancer Paternal Grandmother   . Heart disease Paternal Grandfather   . Heart failure Unknown        Grandfather  . Heart attack Unknown        x's 4 Grandfather  . Stroke Unknown        x's 1 Grandfather  . Diabetes Unknown   . Diabetes Maternal Aunt     Social History   Socioeconomic History  . Marital status: Married    Spouse name: None  . Number of children: None  . Years of education: None  . Highest education level: None  Social Needs  . Financial resource strain: None  . Food insecurity - worry: None  . Food  insecurity - inability: None  . Transportation needs - medical: None  . Transportation needs - non-medical: None  Occupational History  . None  Tobacco Use  . Smoking status: Former Smoker    Types: Cigarettes    Last attempt to quit: 05/07/1997    Years since quitting: 19.8  . Smokeless tobacco: Never Used  . Tobacco comment: no E cigs  Substance and Sexual Activity  . Alcohol use: No    Alcohol/week: 0.0 oz  . Drug use: No  . Sexual activity: Yes  Other Topics Concern  . None  Social History Narrative  . None   Review of Systems - See HPI.  All other ROS are negative.  BP 100/78   Pulse 98   Temp 98 F (36.7 C) (Oral)   Resp 14   Ht 5\' 4"  (1.626 m)   Wt 157 lb (71.2 kg)   SpO2 99%   BMI 26.95 kg/m   Physical Exam    Constitutional: She is oriented to person, place, and time and well-developed, well-nourished, and in no distress.  HENT:  Head: Normocephalic and atraumatic.  Right Ear: Tympanic membrane normal.  Left Ear: Tympanic membrane normal.  Nose: Right sinus exhibits maxillary sinus tenderness. Left sinus exhibits maxillary sinus tenderness.  Mouth/Throat: Uvula is midline, oropharynx is clear and moist and mucous membranes are normal.  Eyes: Conjunctivae are normal.  Neck: Neck supple.  Cardiovascular: Normal rate, regular rhythm, normal heart sounds and intact distal pulses.  Pulmonary/Chest: Effort normal and breath sounds normal. No respiratory distress. She has no wheezes. She has no rales. She exhibits no tenderness.  Lymphadenopathy:    She has no cervical adenopathy.  Neurological: She is alert and oriented to person, place, and time.  Skin: Skin is warm and dry. No rash noted.  Psychiatric: Affect normal.  Vitals reviewed.  Assessment/Plan: 1. Acute bacterial sinusitis Rx doxycycline..  Increase fluids.  Rest.  Saline nasal spray.  Probiotic.  Mucinex as directed.  Humidifier in bedroom.  Call or return to clinic if symptoms are not improving.  - doxycycline (VIBRAMYCIN) 100 MG capsule; Take 1 capsule (100 mg total) 2 (two) times daily by mouth.  Dispense: 20 capsule; Refill: 0   Leeanne Rio, [place]

## 2017-03-13 NOTE — Progress Notes (Signed)
Pre visit review using our clinic review tool, if applicable. No additional management support is needed unless otherwise documented below in the visit note. 

## 2017-03-13 NOTE — Telephone Encounter (Signed)
Pt asking while in office this morning seeing Einar Pheasant if KT would give her a refill on adderall, I did make pt aware that all refills require a 72 hr notice.

## 2017-03-13 NOTE — Patient Instructions (Signed)
Please take antibiotic as directed.  Increase fluid intake.  Use Saline nasal spray.  Take a daily multivitamin.  Place a humidifier in the bedroom.  Please call or return clinic if symptoms are not improving.  Sinusitis Sinusitis is redness, soreness, and swelling (inflammation) of the paranasal sinuses. Paranasal sinuses are air pockets within the bones of your face (beneath the eyes, the middle of the forehead, or above the eyes). In healthy paranasal sinuses, mucus is able to drain out, and air is able to circulate through them by way of your nose. However, when your paranasal sinuses are inflamed, mucus and air can become trapped. This can allow bacteria and other germs to grow and cause infection. Sinusitis can develop quickly and last only a short time (acute) or continue over a long period (chronic). Sinusitis that lasts for more than 12 weeks is considered chronic.  CAUSES  Causes of sinusitis include:  Allergies.  Structural abnormalities, such as displacement of the cartilage that separates your nostrils (deviated septum), which can decrease the air flow through your nose and sinuses and affect sinus drainage.  Functional abnormalities, such as when the small hairs (cilia) that line your sinuses and help remove mucus do not work properly or are not present. SYMPTOMS  Symptoms of acute and chronic sinusitis are the same. The primary symptoms are pain and pressure around the affected sinuses. Other symptoms include:  Upper toothache.  Earache.  Headache.  Bad breath.  Decreased sense of smell and taste.  A cough, which worsens when you are lying flat.  Fatigue.  Fever.  Thick drainage from your nose, which often is green and may contain pus (purulent).  Swelling and warmth over the affected sinuses. DIAGNOSIS  Your caregiver will perform a physical exam. During the exam, your caregiver may:  Look in your nose for signs of abnormal growths in your nostrils (nasal  polyps).  Tap over the affected sinus to check for signs of infection.  View the inside of your sinuses (endoscopy) with a special imaging device with a light attached (endoscope), which is inserted into your sinuses. If your caregiver suspects that you have chronic sinusitis, one or more of the following tests may be recommended:  Allergy tests.  Nasal culture A sample of mucus is taken from your nose and sent to a lab and screened for bacteria.  Nasal cytology A sample of mucus is taken from your nose and examined by your caregiver to determine if your sinusitis is related to an allergy. TREATMENT  Most cases of acute sinusitis are related to a viral infection and will resolve on their own within 10 days. Sometimes medicines are prescribed to help relieve symptoms (pain medicine, decongestants, nasal steroid sprays, or saline sprays).  However, for sinusitis related to a bacterial infection, your caregiver will prescribe antibiotic medicines. These are medicines that will help kill the bacteria causing the infection.  Rarely, sinusitis is caused by a fungal infection. In theses cases, your caregiver will prescribe antifungal medicine. For some cases of chronic sinusitis, surgery is needed. Generally, these are cases in which sinusitis recurs more than 3 times per year, despite other treatments. HOME CARE INSTRUCTIONS   Drink plenty of water. Water helps thin the mucus so your sinuses can drain more easily.  Use a humidifier.  Inhale steam 3 to 4 times a day (for example, sit in the bathroom with the shower running).  Apply a warm, moist washcloth to your face 3 to 4 times a day,  or as directed by your caregiver.  Use saline nasal sprays to help moisten and clean your sinuses.  Take over-the-counter or prescription medicines for pain, discomfort, or fever only as directed by your caregiver. SEEK IMMEDIATE MEDICAL CARE IF:  You have increasing pain or severe headaches.  You have  nausea, vomiting, or drowsiness.  You have swelling around your face.  You have vision problems.  You have a stiff neck.  You have difficulty breathing. MAKE SURE YOU:   Understand these instructions.  Will watch your condition.  Will get help right away if you are not doing well or get worse. Document Released: 04/23/2005 Document Revised: 07/16/2011 Document Reviewed: 05/08/2011 [facility] Patient Information 2014 [place], [place].

## 2017-03-13 NOTE — Telephone Encounter (Signed)
Completed by PA this morning.

## 2017-03-17 ENCOUNTER — Other Ambulatory Visit: Payer: Self-pay | Admitting: Physician Assistant

## 2017-03-17 DIAGNOSIS — J019 Acute sinusitis, unspecified: Principal | ICD-10-CM

## 2017-03-17 DIAGNOSIS — B9689 Other specified bacterial agents as the cause of diseases classified elsewhere: Secondary | ICD-10-CM

## 2017-03-18 MED ORDER — CEFDINIR 300 MG PO CAPS: 300.0000 mg | ORAL_CAPSULE | Freq: Two times a day (BID) | ORAL | 0 refills | Status: AC

## 2017-03-18 NOTE — Telephone Encounter (Signed)
Is she taking with food? If not, needs to start. If so, we can stop and switch to [facility] giving other allergies. 300 mg BID x 7 days.

## 2017-03-18 NOTE — Telephone Encounter (Signed)
Spoke with patient and she was taking the abx with food. Once she took the abx she began having side effects (stomach upset, vomiting about 15 mins after taking the medication). She is agreeable with stopping the Doxycycline and will start Omnicef 300 mg bid x 7 days. Rx sent to pharmacy.

## 2017-03-18 NOTE — Telephone Encounter (Signed)
Patient started Doxycycline. Patient is having upset stomach and nausea and vomiting on abx. Please advise of changing abx

## 2017-03-25 ENCOUNTER — Ambulatory Visit (INDEPENDENT_AMBULATORY_CARE_PROVIDER_SITE_OTHER): Payer: BC Managed Care – PPO | Admitting: Psychology

## 2017-03-25 DIAGNOSIS — F4320 Adjustment disorder, unspecified: Secondary | ICD-10-CM | POA: Diagnosis not present

## 2017-04-08 ENCOUNTER — Ambulatory Visit (INDEPENDENT_AMBULATORY_CARE_PROVIDER_SITE_OTHER): Payer: BC Managed Care – PPO | Admitting: Psychology

## 2017-04-08 DIAGNOSIS — F4320 Adjustment disorder, unspecified: Secondary | ICD-10-CM

## 2017-04-10 ENCOUNTER — Other Ambulatory Visit: Payer: Self-pay | Admitting: Family Medicine

## 2017-04-11 ENCOUNTER — Encounter: Payer: Self-pay | Admitting: Family Medicine

## 2017-04-11 MED ORDER — AMPHETAMINE-DEXTROAMPHET ER 15 MG PO CP24: 15.0000 mg | ORAL_CAPSULE | ORAL | 0 refills | Status: DC

## 2017-04-11 NOTE — Telephone Encounter (Signed)
Last OV 01/23/17 adderall last filled 03/13/17 #30 with 0 belsomra last filled 11/26/16 #30 with 3

## 2017-04-11 NOTE — Telephone Encounter (Signed)
Medication filled to pharmacy as requested.   

## 2017-04-11 NOTE — Telephone Encounter (Signed)
Last OV 11/26/16 adderall last filled 03/13/17 #30 with 3

## 2017-04-22 ENCOUNTER — Ambulatory Visit (INDEPENDENT_AMBULATORY_CARE_PROVIDER_SITE_OTHER): Payer: BC Managed Care – PPO | Admitting: Psychology

## 2017-04-22 DIAGNOSIS — F4320 Adjustment disorder, unspecified: Secondary | ICD-10-CM

## 2017-05-06 ENCOUNTER — Ambulatory Visit: Payer: Self-pay | Admitting: Psychology

## 2017-05-07 ENCOUNTER — Encounter: Payer: Self-pay | Admitting: Family Medicine

## 2017-05-08 ENCOUNTER — Other Ambulatory Visit: Payer: Self-pay | Admitting: Family Medicine

## 2017-05-08 MED ORDER — AMPHETAMINE-DEXTROAMPHET ER 15 MG PO CP24: 15.0000 mg | ORAL_CAPSULE | ORAL | 0 refills | Status: DC

## 2017-05-08 NOTE — Telephone Encounter (Signed)
Medication filled to pharmacy as requested.   

## 2017-05-08 NOTE — Progress Notes (Signed)
Refill provided at pt request- sent electronically

## 2017-05-08 NOTE — Telephone Encounter (Signed)
Last OV 03/13/17 adderall last filled 04/11/17 #30 with 0

## 2017-05-20 ENCOUNTER — Ambulatory Visit (INDEPENDENT_AMBULATORY_CARE_PROVIDER_SITE_OTHER): Payer: BC Managed Care – PPO | Admitting: Psychology

## 2017-05-20 DIAGNOSIS — F4320 Adjustment disorder, unspecified: Secondary | ICD-10-CM

## 2017-05-27 ENCOUNTER — Other Ambulatory Visit: Payer: Self-pay

## 2017-05-27 ENCOUNTER — Ambulatory Visit (INDEPENDENT_AMBULATORY_CARE_PROVIDER_SITE_OTHER): Payer: BC Managed Care – PPO | Admitting: Family Medicine

## 2017-05-27 ENCOUNTER — Encounter: Payer: Self-pay | Admitting: Family Medicine

## 2017-05-27 VITALS — BP 110/78 | HR 78 | Temp 98.1°F | Resp 16 | Ht 64.0 in | Wt 158.0 lb

## 2017-05-27 DIAGNOSIS — F900 Attention-deficit hyperactivity disorder, predominantly inattentive type: Secondary | ICD-10-CM

## 2017-05-27 NOTE — Progress Notes (Signed)
   Subjective:    Patient ID: Abigail Singh, female    DOB: October 20, 1975, 42 y.o.   MRN: [id number]  HPI ADHD- chronic problem.  On Adderall XR 15mg  daily.  Pt reports Adderall is 'working well'.  Some days will have 'breakthrough pain' by the end of the day.  Pt isn't sure if this is b/c she's overdoing or bc the dose is too low and not appropriate.  In the last 2 weeks she has been redoing 'the whole house'.  No palpitations, insomnia, anorexia.   Review of Systems For ROS see HPI     Objective:   Physical Exam  Constitutional: She is oriented to person, place, and time. She appears well-developed and well-nourished. No distress.  HENT:  Head: Normocephalic and atraumatic.  Eyes: Conjunctivae and EOM are normal. Pupils are equal, round, and reactive to light.  Neck: Normal range of motion. Neck supple. No thyromegaly present.  Cardiovascular: Normal rate, regular rhythm, normal heart sounds and intact distal pulses.  No murmur heard. Pulmonary/Chest: Effort normal and breath sounds normal. No respiratory distress.  Abdominal: Soft. She exhibits no distension. There is no tenderness.  Musculoskeletal: She exhibits no edema.  Lymphadenopathy:    She has no cervical adenopathy.  Neurological: She is alert and oriented to person, place, and time.  Skin: Skin is warm and dry.  Psychiatric: She has a normal mood and affect. Her behavior is normal.  Vitals reviewed.         Assessment & Plan:

## 2017-05-27 NOTE — Patient Instructions (Signed)
Schedule your complete physical in 6 months Continue the Adderall 15mg  daily and let me know if we need to make changes Call with any questions or concerns Happy New Year!!!

## 2017-05-27 NOTE — Assessment & Plan Note (Signed)
Chronic problem.  Doing well on Adderall.  Having some breakthrough Fibro pain at the end of the day but this is likely b/c she is overdoing it.  No dose change at this time but will follow.  Pt expressed understanding and is in agreement w/ plan.

## 2017-06-03 ENCOUNTER — Ambulatory Visit (INDEPENDENT_AMBULATORY_CARE_PROVIDER_SITE_OTHER): Payer: BC Managed Care – PPO | Admitting: Psychology

## 2017-06-03 DIAGNOSIS — F4320 Adjustment disorder, unspecified: Secondary | ICD-10-CM | POA: Diagnosis not present

## 2017-06-10 ENCOUNTER — Encounter: Payer: Self-pay | Admitting: Family Medicine

## 2017-06-11 MED ORDER — AMPHETAMINE-DEXTROAMPHET ER 15 MG PO CP24: 15.0000 mg | ORAL_CAPSULE | ORAL | 0 refills | Status: DC

## 2017-06-11 NOTE — Telephone Encounter (Signed)
Last OV 05/27/17 adderall last filled 05/08/17 #30 with 0

## 2017-06-17 ENCOUNTER — Ambulatory Visit (INDEPENDENT_AMBULATORY_CARE_PROVIDER_SITE_OTHER): Payer: BC Managed Care – PPO | Admitting: Psychology

## 2017-06-17 DIAGNOSIS — F4320 Adjustment disorder, unspecified: Secondary | ICD-10-CM

## 2017-07-01 ENCOUNTER — Ambulatory Visit (INDEPENDENT_AMBULATORY_CARE_PROVIDER_SITE_OTHER): Payer: BC Managed Care – PPO | Admitting: Psychology

## 2017-07-01 DIAGNOSIS — F4320 Adjustment disorder, unspecified: Secondary | ICD-10-CM

## 2017-07-05 ENCOUNTER — Other Ambulatory Visit: Payer: Self-pay | Admitting: General Practice

## 2017-07-05 DIAGNOSIS — G43009 Migraine without aura, not intractable, without status migrainosus: Secondary | ICD-10-CM

## 2017-07-05 MED ORDER — TOPIRAMATE 50 MG PO TABS: 50.0000 mg | ORAL_TABLET | Freq: Two times a day (BID) | ORAL | 3 refills | Status: DC

## 2017-07-14 ENCOUNTER — Encounter: Payer: Self-pay | Admitting: Family Medicine

## 2017-07-15 ENCOUNTER — Ambulatory Visit (INDEPENDENT_AMBULATORY_CARE_PROVIDER_SITE_OTHER): Payer: BC Managed Care – PPO | Admitting: Psychology

## 2017-07-15 DIAGNOSIS — F4320 Adjustment disorder, unspecified: Secondary | ICD-10-CM

## 2017-07-15 MED ORDER — AMPHETAMINE-DEXTROAMPHET ER 15 MG PO CP24: 15.0000 mg | ORAL_CAPSULE | ORAL | 0 refills | Status: DC

## 2017-07-15 NOTE — Telephone Encounter (Signed)
Last OV 05/27/17 adderall last filled 06/11/17 #30 with 0

## 2017-07-29 ENCOUNTER — Ambulatory Visit (INDEPENDENT_AMBULATORY_CARE_PROVIDER_SITE_OTHER): Payer: BC Managed Care – PPO | Admitting: Psychology

## 2017-07-29 DIAGNOSIS — F4320 Adjustment disorder, unspecified: Secondary | ICD-10-CM

## 2017-08-12 ENCOUNTER — Ambulatory Visit (INDEPENDENT_AMBULATORY_CARE_PROVIDER_SITE_OTHER): Payer: BC Managed Care – PPO | Admitting: Psychology

## 2017-08-12 DIAGNOSIS — F4320 Adjustment disorder, unspecified: Secondary | ICD-10-CM | POA: Diagnosis not present

## 2017-08-16 ENCOUNTER — Encounter: Payer: Self-pay | Admitting: Family Medicine

## 2017-08-16 MED ORDER — AMPHETAMINE-DEXTROAMPHET ER 15 MG PO CP24: 15.0000 mg | ORAL_CAPSULE | ORAL | 0 refills | Status: DC

## 2017-08-16 NOTE — Telephone Encounter (Signed)
Last OV 05/27/17 adderall last filled 07/15/17 #30 with 0

## 2017-08-26 ENCOUNTER — Ambulatory Visit: Payer: BC Managed Care – PPO | Admitting: Psychology

## 2017-08-28 ENCOUNTER — Ambulatory Visit (INDEPENDENT_AMBULATORY_CARE_PROVIDER_SITE_OTHER): Payer: BC Managed Care – PPO | Admitting: Psychology

## 2017-08-28 DIAGNOSIS — F4320 Adjustment disorder, unspecified: Secondary | ICD-10-CM

## 2017-09-09 ENCOUNTER — Ambulatory Visit (INDEPENDENT_AMBULATORY_CARE_PROVIDER_SITE_OTHER): Payer: BC Managed Care – PPO | Admitting: Psychology

## 2017-09-09 DIAGNOSIS — F4320 Adjustment disorder, unspecified: Secondary | ICD-10-CM | POA: Diagnosis not present

## 2017-09-11 ENCOUNTER — Encounter: Payer: Self-pay | Admitting: Family Medicine

## 2017-09-11 MED ORDER — SUVOREXANT 10 MG PO TABS: 1.0000 | ORAL_TABLET | Freq: Every day | ORAL | 1 refills | Status: DC

## 2017-09-11 MED ORDER — AMPHETAMINE-DEXTROAMPHET ER 15 MG PO CP24: 15.0000 mg | ORAL_CAPSULE | ORAL | 0 refills | Status: DC

## 2017-09-11 NOTE — Telephone Encounter (Signed)
Last OV 05/27/17 adderall last filled 08/16/17 #30 with 0 belsomra last filled 04/11/17 #30 with 1

## 2017-09-23 ENCOUNTER — Ambulatory Visit (INDEPENDENT_AMBULATORY_CARE_PROVIDER_SITE_OTHER): Payer: BC Managed Care – PPO | Admitting: Psychology

## 2017-09-23 DIAGNOSIS — F4320 Adjustment disorder, unspecified: Secondary | ICD-10-CM

## 2017-10-05 ENCOUNTER — Other Ambulatory Visit: Payer: Self-pay | Admitting: Family Medicine

## 2017-10-05 DIAGNOSIS — G43009 Migraine without aura, not intractable, without status migrainosus: Secondary | ICD-10-CM

## 2017-10-07 ENCOUNTER — Ambulatory Visit (INDEPENDENT_AMBULATORY_CARE_PROVIDER_SITE_OTHER): Payer: BC Managed Care – PPO | Admitting: Psychology

## 2017-10-07 DIAGNOSIS — F4320 Adjustment disorder, unspecified: Secondary | ICD-10-CM

## 2017-10-14 ENCOUNTER — Encounter: Payer: Self-pay | Admitting: Family Medicine

## 2017-10-14 MED ORDER — SUVOREXANT 10 MG PO TABS: 1.0000 | ORAL_TABLET | Freq: Every day | ORAL | 1 refills | Status: DC

## 2017-10-14 MED ORDER — AMPHETAMINE-DEXTROAMPHET ER 15 MG PO CP24: 15.0000 mg | ORAL_CAPSULE | ORAL | 0 refills | Status: DC

## 2017-10-21 ENCOUNTER — Ambulatory Visit (INDEPENDENT_AMBULATORY_CARE_PROVIDER_SITE_OTHER): Payer: BC Managed Care – PPO | Admitting: Psychology

## 2017-10-21 DIAGNOSIS — F4320 Adjustment disorder, unspecified: Secondary | ICD-10-CM | POA: Diagnosis not present

## 2017-11-04 ENCOUNTER — Ambulatory Visit (INDEPENDENT_AMBULATORY_CARE_PROVIDER_SITE_OTHER): Payer: BC Managed Care – PPO | Admitting: Psychology

## 2017-11-04 DIAGNOSIS — F4320 Adjustment disorder, unspecified: Secondary | ICD-10-CM

## 2017-11-11 ENCOUNTER — Encounter: Payer: Self-pay | Admitting: Family Medicine

## 2017-11-12 MED ORDER — AMPHETAMINE-DEXTROAMPHET ER 15 MG PO CP24: 15.0000 mg | ORAL_CAPSULE | ORAL | 0 refills | Status: DC

## 2017-11-12 NOTE — Telephone Encounter (Signed)
Last OV 05/27/17 adderall last filled 10/14/17 #30 with 0 belsomra last filled 10/14/17 #30 with 1

## 2017-11-18 ENCOUNTER — Ambulatory Visit (INDEPENDENT_AMBULATORY_CARE_PROVIDER_SITE_OTHER): Payer: BC Managed Care – PPO | Admitting: Psychology

## 2017-11-18 DIAGNOSIS — F4320 Adjustment disorder, unspecified: Secondary | ICD-10-CM | POA: Diagnosis not present

## 2017-11-20 ENCOUNTER — Ambulatory Visit (INDEPENDENT_AMBULATORY_CARE_PROVIDER_SITE_OTHER): Payer: BC Managed Care – PPO | Admitting: Psychology

## 2017-11-20 DIAGNOSIS — F4310 Post-traumatic stress disorder, unspecified: Secondary | ICD-10-CM | POA: Diagnosis not present

## 2017-12-02 ENCOUNTER — Ambulatory Visit: Payer: BC Managed Care – PPO | Admitting: Psychology

## 2017-12-02 ENCOUNTER — Encounter: Payer: BC Managed Care – PPO | Admitting: Physician Assistant

## 2017-12-05 ENCOUNTER — Encounter: Payer: Self-pay | Admitting: Physician Assistant

## 2017-12-05 ENCOUNTER — Other Ambulatory Visit: Payer: Self-pay

## 2017-12-05 ENCOUNTER — Ambulatory Visit (INDEPENDENT_AMBULATORY_CARE_PROVIDER_SITE_OTHER): Payer: BC Managed Care – PPO | Admitting: Physician Assistant

## 2017-12-05 VITALS — BP 104/72 | HR 94 | Temp 97.7°F | Resp 16 | Ht 64.0 in | Wt 156.8 lb

## 2017-12-05 DIAGNOSIS — M797 Fibromyalgia: Secondary | ICD-10-CM | POA: Diagnosis not present

## 2017-12-05 DIAGNOSIS — J019 Acute sinusitis, unspecified: Secondary | ICD-10-CM | POA: Diagnosis not present

## 2017-12-05 DIAGNOSIS — F900 Attention-deficit hyperactivity disorder, predominantly inattentive type: Secondary | ICD-10-CM | POA: Diagnosis not present

## 2017-12-05 DIAGNOSIS — Z0000 Encounter for general adult medical examination without abnormal findings: Secondary | ICD-10-CM | POA: Diagnosis not present

## 2017-12-05 DIAGNOSIS — B9689 Other specified bacterial agents as the cause of diseases classified elsewhere: Secondary | ICD-10-CM | POA: Diagnosis not present

## 2017-12-05 LAB — LIPID PANEL
Cholesterol: 159 mg/dL (ref 0–200)
HDL: 46 mg/dL (ref >39.00)
LDL Cholesterol: 86 mg/dL (ref 0–99)
NonHDL: 113.22
TRIGLYCERIDES: 135 mg/dL (ref 0.0–149.0)
Total CHOL/HDL Ratio: 3
VLDL: 27 mg/dL (ref 0.0–40.0)

## 2017-12-05 LAB — CBC WITH DIFFERENTIAL/PLATELET
BASOS ABS: 0 10*3/uL (ref 0.0–0.1)
Basophils Relative: 0.3 % (ref 0.0–3.0)
EOS PCT: 1.8 % (ref 0.0–5.0)
Eosinophils Absolute: 0.1 10*3/uL (ref 0.0–0.7)
HCT: 39.8 % (ref 36.0–46.0)
Hemoglobin: 13.5 g/dL (ref 12.0–15.0)
LYMPHS ABS: 1.6 10*3/uL (ref 0.7–4.0)
Lymphocytes Relative: 23.5 % (ref 12.0–46.0)
MCHC: 33.8 g/dL (ref 30.0–36.0)
MCV: 87.2 fl (ref 78.0–100.0)
MONOS PCT: 8.9 % (ref 3.0–12.0)
Monocytes Absolute: 0.6 10*3/uL (ref 0.1–1.0)
NEUTROS ABS: 4.6 10*3/uL (ref 1.4–7.7)
Neutrophils Relative %: 65.5 % (ref 43.0–77.0)
Platelets: 208 10*3/uL (ref 150.0–400.0)
RBC: 4.56 Mil/uL (ref 3.87–5.11)
RDW: 13.7 % (ref 11.5–15.5)
WBC: 7 10*3/uL (ref 4.0–10.5)

## 2017-12-05 LAB — HEMOGLOBIN A1C: Hgb A1c MFr Bld: 5.6 % (ref 4.6–6.5)

## 2017-12-05 LAB — COMPREHENSIVE METABOLIC PANEL
ALT: 10 U/L (ref 0–35)
AST: 13 U/L (ref 0–37)
Albumin: 4.2 g/dL (ref 3.5–5.2)
Alkaline Phosphatase: 70 U/L (ref 39–117)
BILIRUBIN TOTAL: 0.4 mg/dL (ref 0.2–1.2)
BUN: 8 mg/dL (ref 6–23)
CALCIUM: 9 mg/dL (ref 8.4–10.5)
CO2: 26 mEq/L (ref 19–32)
CREATININE: 0.74 mg/dL (ref 0.40–1.20)
Chloride: 104 mEq/L (ref 96–112)
GFR: 91.52 mL/min (ref >60.00)
Glucose, Bld: 92 mg/dL (ref 70–99)
Potassium: 4 mEq/L (ref 3.5–5.1)
Sodium: 138 mEq/L (ref 135–145)
TOTAL PROTEIN: 7.1 g/dL (ref 6.0–8.3)

## 2017-12-05 MED ORDER — AMPHETAMINE-DEXTROAMPHET ER 20 MG PO CP24: 20.0000 mg | ORAL_CAPSULE | ORAL | 0 refills | Status: DC

## 2017-12-05 MED ORDER — DOXYCYCLINE HYCLATE 100 MG PO CAPS: 100.0000 mg | ORAL_CAPSULE | Freq: Two times a day (BID) | ORAL | 0 refills | Status: DC

## 2017-12-05 NOTE — Patient Instructions (Signed)
Please go to the lab for blood work.  Our office will call you with your results unless you have chosen to receive results via MyChart. If your blood work is normal we will follow-up each year for physicals and as scheduled for chronic medical problems. If anything is abnormal we will treat accordingly and get you in for a follow-up.  Start the new dose of the Adderall XR daily. Follow-up with Dr. Birdie Riddle in 1 month. Try to look into a Tai Chi class to help with fibromyalgia. I am hoping this will be very helpful.      Preventive Care 40-64 Years, Female Preventive care refers to lifestyle choices and visits with your health care provider that can promote health and wellness. What does preventive care include?  A yearly physical exam. This is also called an annual well check.  Dental exams once or twice a year.  Routine eye exams. Ask your health care provider how often you should have your eyes checked.  Personal lifestyle choices, including: ? Daily care of your teeth and gums. ? Regular physical activity. ? Eating a healthy diet. ? Avoiding tobacco and drug use. ? Limiting alcohol use. ? Practicing safe sex. ? Taking low-dose aspirin daily starting at age 41. ? Taking vitamin and mineral supplements as recommended by your health care provider. What happens during an annual well check? The services and screenings done by your health care provider during your annual well check will depend on your age, overall health, lifestyle risk factors, and family history of disease. Counseling Your health care provider may ask you questions about your:  Alcohol use.  Tobacco use.  Drug use.  Emotional well-being.  Home and relationship well-being.  Sexual activity.  Eating habits.  Work and work Statistician.  Method of birth control.  Menstrual cycle.  Pregnancy history.  Screening You may have the following tests or measurements:  Height, weight, and BMI.  Blood  pressure.  Lipid and cholesterol levels. These may be checked every 5 years, or more frequently if you are over 2 years old.  Skin check.  Lung cancer screening. You may have this screening every year starting at age 40 if you have a 30-pack-year history of smoking and currently smoke or have quit within the past 15 years.  Fecal occult blood test (FOBT) of the stool. You may have this test every year starting at age 55.  Flexible sigmoidoscopy or colonoscopy. You may have a sigmoidoscopy every 5 years or a colonoscopy every 10 years starting at age 14.  Hepatitis C blood test.  Hepatitis B blood test.  Sexually transmitted disease (STD) testing.  Diabetes screening. This is done by checking your blood sugar (glucose) after you have not eaten for a while (fasting). You may have this done every 1-3 years.  Mammogram. This may be done every 1-2 years. Talk to your health care provider about when you should start having regular mammograms. This may depend on whether you have a family history of breast cancer.  BRCA-related cancer screening. This may be done if you have a family history of breast, ovarian, tubal, or peritoneal cancers.  Pelvic exam and Pap test. This may be done every 3 years starting at age 51. Starting at age 33, this may be done every 5 years if you have a Pap test in combination with an HPV test.  Bone density scan. This is done to screen for osteoporosis. You may have this scan if you are at high risk  for osteoporosis.  Discuss your test results, treatment options, and if necessary, the need for more tests with your health care provider. Vaccines Your health care provider may recommend certain vaccines, such as:  Influenza vaccine. This is recommended every year.  Tetanus, diphtheria, and acellular pertussis (Tdap, Td) vaccine. You may need a Td booster every 10 years.  Varicella vaccine. You may need this if you have not been vaccinated.  Zoster vaccine. You  may need this after age 80.  Measles, mumps, and rubella (MMR) vaccine. You may need at least one dose of MMR if you were born in 1957 or later. You may also need a second dose.  Pneumococcal 13-valent conjugate (PCV13) vaccine. You may need this if you have certain conditions and were not previously vaccinated.  Pneumococcal polysaccharide (PPSV23) vaccine. You may need one or two doses if you smoke cigarettes or if you have certain conditions.  Meningococcal vaccine. You may need this if you have certain conditions.  Hepatitis A vaccine. You may need this if you have certain conditions or if you travel or work in places where you may be exposed to hepatitis A.  Hepatitis B vaccine. You may need this if you have certain conditions or if you travel or work in places where you may be exposed to hepatitis B.  Haemophilus influenzae type b (Hib) vaccine. You may need this if you have certain conditions.  Talk to your health care provider about which screenings and vaccines you need and how often you need them. This information is not intended to replace advice given to you by your health care provider. Make sure you discuss any questions you have with your health care provider. Document Released: 05/20/2015 Document Revised: 01/11/2016 Document Reviewed: 02/22/2015 Elsevier Interactive Patient Education  [employer].

## 2017-12-05 NOTE — Progress Notes (Signed)
Patient presents to clinic today for annual exam.  Patient is fasting for labs.  Diet -- Endorses well-balanced diet overall. Exercise -- Tries to stay active. Swims daily in the summer. Exercise is limited during flares of her fibromyalgia.   Acute Concerns: Patient endorses 2 weeks of sinus pressure, sinus pain, PND. Denies fever, chills, Endorses sinus pain. Denies current eat pain. Has been taking Benadryl which is only helping slightly.   Chronic Issues: ADD -- Patient is currently on are gimen of Adderall XR 15 mg daily. Is taking as directed. Notes having done well overall on this regimen -- helps with focus and helps with pain with fibromyalgia. Has had some breakthrough symptoms. States she and Dr. Birdie Riddle had previosuly discussed increase in medication due to these breakthrough episodes. Would like to revisit this today. Is starting EMDR therapy in two weeks. Marland Kitchen   Health Maintenance: Immunizations --up-to-date PAP -- up-to-date  Past Medical History:  Diagnosis Date  . Allergy    sseasonal  . Anemia    as teenager  . Anxiety   . Depression   . Fibroid   . Fibromyalgia   . Heartburn in pregnancy   . History of fibromyalgia   . Migraines   . Neuromuscular disorder (HCC)    fibromyalgia  . Postpartum care following cesarean delivery 03/17/2012  . Preterm labor   . Status post cesarean delivery (repeat x 2) 03/17/2012  . Urinary tract infection     Past Surgical History:  Procedure Laterality Date  . abnormal cells in colon/tatoo area     2012  . CESAREAN SECTION    . CESAREAN SECTION  03/17/2012   Procedure: CESAREAN SECTION;  Surgeon: Marvene Staff, MD;  Location: [facility];  Service: Obstetrics;  Laterality: N/A;  Repeat C/S.  EDD: 03/23/12  . COLONOSCOPY    . DILATION AND CURETTAGE OF UTERUS    . POLYPECTOMY    . tubes in ears    . WISDOM TOOTH EXTRACTION      Current Outpatient Medications on File Prior to Visit  Medication Sig Dispense Refill  .  acetaminophen (TYLENOL) 325 MG tablet Take 650 mg by mouth every 6 (six) hours as needed.    Marland Kitchen amphetamine-dextroamphetamine (ADDERALL XR) 15 MG 24 hr capsule Take 1 capsule by mouth every morning. 30 capsule 0  . Calcium Carbonate-Vit D-Min (CALCIUM 1200 PO) Take 1 capsule by mouth daily.    . Cholecalciferol (VITAMIN D) 2000 UNITS CAPS Take 2,000 Units by mouth daily.    . MULTIPLE VITAMIN PO Take 1 capsule by mouth daily.    . Suvorexant (BELSOMRA) 10 MG TABS Take 1 tablet by mouth at bedtime. 30 tablet 1  . topiramate (TOPAMAX) 50 MG tablet TAKE 1 TABLET BY MOUTH TWICE A DAY 60 tablet 2  . vitamin B-12 (CYANOCOBALAMIN) 500 MCG tablet Take 500 mcg by mouth daily.     No current facility-administered medications on file prior to visit.     Allergies  Allergen Reactions  . Aspirin Anaphylaxis    Pt reports being able to take ibuprofen with no problems.  . Codeine Other (See Comments)    halucinations  . Eggs Or Egg-Derived Products Other (See Comments)    Patient states ok with flu shots, etc. No anaphalaxis.  Eggs give stomach cramps with diarrhea. After diarrhea ok.-pt eats baked goods that contains eggs with no issues   . Erythromycin Rash  . Lactose Intolerance (Gi) Diarrhea    Diarrhea with lactose.  Marland Kitchen  Penicillins Rash  . Sulfonamide Derivatives Rash    Childhood reaction, has taken recently with only stomach irritation.    Family History  Problem Relation Age of Onset  . Diabetes Mother   . Diabetes Father   . Diabetes Maternal Grandmother   . Colon polyps Maternal Grandmother   . Diabetes Maternal Grandfather   . Colon polyps Maternal Grandfather   . Heart disease Maternal Grandfather   . Colon cancer Paternal Grandmother   . Heart disease Paternal Grandfather   . Heart failure Unknown        Grandfather  . Heart attack Unknown        x's 4 Grandfather  . Stroke Unknown        x's 1 Grandfather  . Diabetes Unknown   . Diabetes Maternal Aunt     Social History     Socioeconomic History  . Marital status: Married    Spouse name: Not on file  . Number of children: Not on file  . Years of education: Not on file  . Highest education level: Not on file  Occupational History  . Not on file  Social Needs  . Financial resource strain: Not on file  . Food insecurity:    Worry: Not on file    Inability: Not on file  . Transportation needs:    Medical: Not on file    Non-medical: Not on file  Tobacco Use  . Smoking status: Former Smoker    Types: Cigarettes    Last attempt to quit: 05/07/1997    Years since quitting: 20.5  . Smokeless tobacco: Never Used  . Tobacco comment: no E cigs  Substance and Sexual Activity  . Alcohol use: No    Alcohol/week: 0.0 oz  . Drug use: No  . Sexual activity: Yes  Lifestyle  . Physical activity:    Days per week: Not on file    Minutes per session: Not on file  . Stress: Not on file  Relationships  . Social connections:    Talks on phone: Not on file    Gets together: Not on file    Attends religious service: Not on file    Active member of club or organization: Not on file    Attends meetings of clubs or organizations: Not on file    Relationship status: Not on file  . Intimate partner violence:    Fear of current or ex partner: Not on file    Emotionally abused: Not on file    Physically abused: Not on file    Forced sexual activity: Not on file  Other Topics Concern  . Not on file  Social History Narrative  . Not on file   Review of Systems  Constitutional: Negative for fever and weight loss.  HENT: Negative for ear discharge, ear pain, hearing loss and tinnitus.   Eyes: Negative for blurred vision, double vision, photophobia and pain.  Respiratory: Negative for cough and shortness of breath.   Cardiovascular: Negative for chest pain and palpitations.  Gastrointestinal: Negative for abdominal pain, blood in stool, constipation, diarrhea, heartburn, melena, nausea and vomiting.  Genitourinary:  Negative for dysuria, flank pain, frequency, hematuria and urgency.  Musculoskeletal: Negative for falls.  Neurological: Negative for dizziness, loss of consciousness and headaches.  Endo/Heme/Allergies: Negative for environmental allergies.  Psychiatric/Behavioral: Negative for depression, hallucinations, substance abuse and suicidal ideas. The patient is not nervous/anxious and does not have insomnia.    BP 104/72   Pulse 94  Temp 97.7 F (36.5 C) (Oral)   Resp 16   Ht 5' 4" (1.626 m)   Wt 156 lb 12.8 oz (71.1 kg)   SpO2 99%   BMI 26.91 kg/m   Physical Exam  Constitutional: She is oriented to person, place, and time.  HENT:  Head: Normocephalic and atraumatic.  Right Ear: Tympanic membrane, external ear and ear canal normal.  Left Ear: Tympanic membrane, external ear and ear canal normal.  Nose: Nose normal. No mucosal edema.  Mouth/Throat: Uvula is midline, oropharynx is clear and moist and mucous membranes are normal. No oropharyngeal exudate or posterior oropharyngeal erythema.  Eyes: Pupils are equal, round, and reactive to light. Conjunctivae are normal.  Neck: Neck supple. No thyromegaly present.  Cardiovascular: Normal rate, regular rhythm, normal heart sounds and intact distal pulses.  Pulmonary/Chest: Effort normal and breath sounds normal. No respiratory distress. She has no wheezes. She has no rales.  Abdominal: Soft. Bowel sounds are normal. She exhibits no distension and no mass. There is no tenderness. There is no rebound and no guarding.  Lymphadenopathy:    She has no cervical adenopathy.  Neurological: She is alert and oriented to person, place, and time. No cranial nerve deficit.  Skin: Skin is warm and dry. No rash noted.  Vitals reviewed.  Assessment/Plan: 1. Visit for preventive health examination Depression screen negative. Health Maintenance reviewed. Preventive schedule discussed and handout given in AVS. Will obtain fasting labs today.  - CBC  with Differential/Platelet - Comprehensive metabolic panel - Lipid panel - Hemoglobin A1c  2. Attention deficit hyperactivity disorder (ADHD), predominantly inattentive type Will increase from 15 mg to 20 mg XR to see if this helps further with focus and her fibromyalgia. Follow-up with PCP in 1 month. RX printed and signed.   3. Fibromyalgia Recommended Tai Chi as there are studies showing benefit on baseline fibromyalgia symptoms and helping to reduce frequency of exacerbations. Will monitor.   4. Acute bacterial sinusitis Rx doxycycline.  Increase fluids.  Rest.  Saline nasal spray.  Probiotic.  Mucinex as directed.  Humidifier in bedroom.  Call or return to clinic if symptoms are not improving.    Leeanne Rio, PA-C

## 2017-12-06 ENCOUNTER — Ambulatory Visit (INDEPENDENT_AMBULATORY_CARE_PROVIDER_SITE_OTHER): Payer: BC Managed Care – PPO | Admitting: Psychology

## 2017-12-06 DIAGNOSIS — F4320 Adjustment disorder, unspecified: Secondary | ICD-10-CM

## 2017-12-16 ENCOUNTER — Ambulatory Visit (INDEPENDENT_AMBULATORY_CARE_PROVIDER_SITE_OTHER): Payer: BC Managed Care – PPO | Admitting: Psychology

## 2017-12-16 DIAGNOSIS — F4320 Adjustment disorder, unspecified: Secondary | ICD-10-CM | POA: Diagnosis not present

## 2017-12-30 ENCOUNTER — Ambulatory Visit: Payer: BC Managed Care – PPO | Admitting: Psychology

## 2018-01-04 ENCOUNTER — Encounter: Payer: Self-pay | Admitting: Family Medicine

## 2018-01-07 MED ORDER — AMPHETAMINE-DEXTROAMPHET ER 20 MG PO CP24: 20.0000 mg | ORAL_CAPSULE | ORAL | 0 refills | Status: DC

## 2018-01-07 MED ORDER — SUVOREXANT 10 MG PO TABS: 1.0000 | ORAL_TABLET | Freq: Every day | ORAL | 1 refills | Status: DC

## 2018-01-07 NOTE — Telephone Encounter (Signed)
Last OV 12/05/17 adderall last filled 12/05/17 #30 with 0 belsomra last filled 10/14/17 #30 with 1

## 2018-01-07 NOTE — Addendum Note (Signed)
Addended by: Davis Gourd on: 01/07/2018 10:51 AM   Modules accepted: Orders

## 2018-01-08 ENCOUNTER — Other Ambulatory Visit: Payer: Self-pay | Admitting: Family Medicine

## 2018-01-08 DIAGNOSIS — G43009 Migraine without aura, not intractable, without status migrainosus: Secondary | ICD-10-CM

## 2018-01-09 ENCOUNTER — Ambulatory Visit: Payer: BC Managed Care – PPO | Admitting: Psychology

## 2018-01-09 ENCOUNTER — Ambulatory Visit: Payer: Self-pay | Admitting: Family Medicine

## 2018-01-13 ENCOUNTER — Ambulatory Visit (INDEPENDENT_AMBULATORY_CARE_PROVIDER_SITE_OTHER): Payer: BC Managed Care – PPO | Admitting: Psychology

## 2018-01-13 DIAGNOSIS — F4320 Adjustment disorder, unspecified: Secondary | ICD-10-CM | POA: Diagnosis not present

## 2018-01-15 ENCOUNTER — Ambulatory Visit: Payer: Self-pay | Admitting: Family Medicine

## 2018-01-20 ENCOUNTER — Ambulatory Visit (INDEPENDENT_AMBULATORY_CARE_PROVIDER_SITE_OTHER): Payer: BC Managed Care – PPO | Admitting: Family Medicine

## 2018-01-20 ENCOUNTER — Encounter: Payer: Self-pay | Admitting: Family Medicine

## 2018-01-20 ENCOUNTER — Other Ambulatory Visit: Payer: Self-pay

## 2018-01-20 VITALS — BP 110/81 | HR 78 | Temp 98.4°F | Resp 16 | Ht 64.0 in | Wt 160.0 lb

## 2018-01-20 DIAGNOSIS — F900 Attention-deficit hyperactivity disorder, predominantly inattentive type: Secondary | ICD-10-CM

## 2018-01-20 DIAGNOSIS — Z23 Encounter for immunization: Secondary | ICD-10-CM | POA: Diagnosis not present

## 2018-01-20 DIAGNOSIS — R479 Unspecified speech disturbances: Secondary | ICD-10-CM

## 2018-01-20 NOTE — Patient Instructions (Signed)
Follow up by MyChart or phone in 1 month (sooner if needed) Decrease the Topamax to 1/2 tab twice daily Continue the Adderall at your current dose Monitor your speech (or have those around you do it) as we make the medication changes Call with any questions or concerns Happy [birth year] Birthday!

## 2018-01-20 NOTE — Progress Notes (Signed)
   Subjective:    Patient ID: Abigail Singh, female    DOB: Mar 02, 1976, 42 y.o.   MRN: [id number]  HPI ADHD- chronic problem, on Adderall XR 20mg  daily.  Pt feels the Adderall is working well after increasing the dose.  Pt's pain has improved since increasing the dose.  No palpitations, insomnia above baseline.  No change in appetite.  Speech change- pt reports that friends, family, and coworkers have all noticed a change in her dialect, mouth movements, and 'speech' in general.  No slurring of speech.   Review of Systems For ROS see HPI     Objective:   Physical Exam  Constitutional: She is oriented to person, place, and time. She appears well-developed and well-nourished. No distress.  HENT:  Head: Normocephalic and atraumatic.  Eyes: Pupils are equal, round, and reactive to light. Conjunctivae and EOM are normal.  Neck: Normal range of motion. Neck supple. No thyromegaly present.  Cardiovascular: Normal rate, regular rhythm, normal heart sounds and intact distal pulses.  No murmur heard. Pulmonary/Chest: Effort normal and breath sounds normal. No respiratory distress.  Musculoskeletal: She exhibits no edema.  Lymphadenopathy:    She has no cervical adenopathy.  Neurological: She is alert and oriented to person, place, and time. No cranial nerve deficit. Coordination normal.  Normal speech pattern and motor movements of mouth and tongue  Skin: Skin is warm and dry.  Psychiatric: She has a normal mood and affect. Her behavior is normal.  Vitals reviewed.         Assessment & Plan:  Speech changes- new.  Pt's speech pattern is normal- no slurring or abnormal pattern.  Pt reports family and friends have pointed this out to her over the last year.  No obvious changes today.  Topamax can change speech so based on this, we will decrease to 1/2 tab twice daily.  If no improvement, will refer to Neuro.  Pt expressed understanding and is in agreement w/ plan.

## 2018-01-21 NOTE — Assessment & Plan Note (Signed)
Chronic problem.  Pt reports her pain level has improved since increasing dose and she is not experiencing any side effects.  Will continue current dose.  Pt expressed understanding and is in agreement w/ plan.

## 2018-01-22 ENCOUNTER — Ambulatory Visit: Payer: BC Managed Care – PPO | Admitting: Psychology

## 2018-01-24 ENCOUNTER — Ambulatory Visit (INDEPENDENT_AMBULATORY_CARE_PROVIDER_SITE_OTHER): Payer: BC Managed Care – PPO | Admitting: Psychology

## 2018-01-24 DIAGNOSIS — F4310 Post-traumatic stress disorder, unspecified: Secondary | ICD-10-CM

## 2018-01-27 ENCOUNTER — Ambulatory Visit (INDEPENDENT_AMBULATORY_CARE_PROVIDER_SITE_OTHER): Payer: BC Managed Care – PPO | Admitting: Psychology

## 2018-01-27 DIAGNOSIS — F4320 Adjustment disorder, unspecified: Secondary | ICD-10-CM | POA: Diagnosis not present

## 2018-01-29 ENCOUNTER — Ambulatory Visit (INDEPENDENT_AMBULATORY_CARE_PROVIDER_SITE_OTHER): Payer: BC Managed Care – PPO | Admitting: Psychology

## 2018-01-29 DIAGNOSIS — F4310 Post-traumatic stress disorder, unspecified: Secondary | ICD-10-CM | POA: Diagnosis not present

## 2018-02-07 ENCOUNTER — Encounter: Payer: Self-pay | Admitting: Family Medicine

## 2018-02-07 ENCOUNTER — Other Ambulatory Visit: Payer: Self-pay | Admitting: Obstetrics and Gynecology

## 2018-02-07 DIAGNOSIS — R4789 Other speech disturbances: Secondary | ICD-10-CM

## 2018-02-07 MED ORDER — AMPHETAMINE-DEXTROAMPHET ER 20 MG PO CP24: 20.0000 mg | ORAL_CAPSULE | ORAL | 0 refills | Status: DC

## 2018-02-07 NOTE — Telephone Encounter (Signed)
Last OV 01/20/18 adderall last filled 01/07/18 #30 with 0

## 2018-02-07 NOTE — Telephone Encounter (Signed)
Referral placed, pt informed.

## 2018-02-07 NOTE — Telephone Encounter (Signed)
Please refer to [facility] (Dr Erling Cruz has retired but they will see her since she's been there before).  Dx change in speech

## 2018-02-10 ENCOUNTER — Ambulatory Visit (INDEPENDENT_AMBULATORY_CARE_PROVIDER_SITE_OTHER): Payer: BC Managed Care – PPO | Admitting: Psychology

## 2018-02-10 DIAGNOSIS — F4320 Adjustment disorder, unspecified: Secondary | ICD-10-CM | POA: Diagnosis not present

## 2018-02-12 ENCOUNTER — Ambulatory Visit (INDEPENDENT_AMBULATORY_CARE_PROVIDER_SITE_OTHER): Payer: BC Managed Care – PPO | Admitting: Psychology

## 2018-02-12 DIAGNOSIS — F4310 Post-traumatic stress disorder, unspecified: Secondary | ICD-10-CM | POA: Diagnosis not present

## 2018-02-17 ENCOUNTER — Ambulatory Visit (INDEPENDENT_AMBULATORY_CARE_PROVIDER_SITE_OTHER): Payer: BC Managed Care – PPO | Admitting: Psychology

## 2018-02-17 DIAGNOSIS — F4310 Post-traumatic stress disorder, unspecified: Secondary | ICD-10-CM

## 2018-02-24 ENCOUNTER — Ambulatory Visit (INDEPENDENT_AMBULATORY_CARE_PROVIDER_SITE_OTHER): Payer: BC Managed Care – PPO | Admitting: Psychology

## 2018-02-24 DIAGNOSIS — F4320 Adjustment disorder, unspecified: Secondary | ICD-10-CM

## 2018-02-26 ENCOUNTER — Ambulatory Visit (INDEPENDENT_AMBULATORY_CARE_PROVIDER_SITE_OTHER): Payer: BC Managed Care – PPO | Admitting: Psychology

## 2018-02-26 DIAGNOSIS — F4310 Post-traumatic stress disorder, unspecified: Secondary | ICD-10-CM | POA: Diagnosis not present

## 2018-03-03 ENCOUNTER — Ambulatory Visit (INDEPENDENT_AMBULATORY_CARE_PROVIDER_SITE_OTHER): Payer: BC Managed Care – PPO | Admitting: Psychology

## 2018-03-03 DIAGNOSIS — F4310 Post-traumatic stress disorder, unspecified: Secondary | ICD-10-CM | POA: Diagnosis not present

## 2018-03-10 ENCOUNTER — Ambulatory Visit (INDEPENDENT_AMBULATORY_CARE_PROVIDER_SITE_OTHER): Payer: BC Managed Care – PPO | Admitting: Psychology

## 2018-03-10 DIAGNOSIS — F4320 Adjustment disorder, unspecified: Secondary | ICD-10-CM

## 2018-03-12 ENCOUNTER — Encounter: Payer: Self-pay | Admitting: Family Medicine

## 2018-03-12 ENCOUNTER — Ambulatory Visit (INDEPENDENT_AMBULATORY_CARE_PROVIDER_SITE_OTHER): Payer: BC Managed Care – PPO | Admitting: Psychology

## 2018-03-12 DIAGNOSIS — F4310 Post-traumatic stress disorder, unspecified: Secondary | ICD-10-CM | POA: Diagnosis not present

## 2018-03-12 MED ORDER — AMPHETAMINE-DEXTROAMPHET ER 20 MG PO CP24: 20.0000 mg | ORAL_CAPSULE | ORAL | 0 refills | Status: DC

## 2018-03-12 NOTE — Telephone Encounter (Signed)
Last OV 01/20/18 adderall last filled 02/07/18 #30 with 0

## 2018-03-24 ENCOUNTER — Ambulatory Visit (INDEPENDENT_AMBULATORY_CARE_PROVIDER_SITE_OTHER): Payer: BC Managed Care – PPO | Admitting: Psychology

## 2018-03-24 DIAGNOSIS — F4320 Adjustment disorder, unspecified: Secondary | ICD-10-CM

## 2018-03-26 ENCOUNTER — Ambulatory Visit (INDEPENDENT_AMBULATORY_CARE_PROVIDER_SITE_OTHER): Payer: BC Managed Care – PPO | Admitting: Psychology

## 2018-03-26 DIAGNOSIS — F4310 Post-traumatic stress disorder, unspecified: Secondary | ICD-10-CM

## 2018-04-07 ENCOUNTER — Encounter: Payer: Self-pay | Admitting: Family Medicine

## 2018-04-07 ENCOUNTER — Ambulatory Visit (INDEPENDENT_AMBULATORY_CARE_PROVIDER_SITE_OTHER): Payer: BC Managed Care – PPO | Admitting: Psychology

## 2018-04-07 ENCOUNTER — Other Ambulatory Visit: Payer: Self-pay | Admitting: Family Medicine

## 2018-04-07 DIAGNOSIS — G43009 Migraine without aura, not intractable, without status migrainosus: Secondary | ICD-10-CM

## 2018-04-07 DIAGNOSIS — F4320 Adjustment disorder, unspecified: Secondary | ICD-10-CM

## 2018-04-07 MED ORDER — SUVOREXANT 10 MG PO TABS: 1.0000 | ORAL_TABLET | Freq: Every day | ORAL | 1 refills | Status: DC

## 2018-04-07 NOTE — Telephone Encounter (Signed)
Last OV 01/20/18 belsomra last filled 01/07/18 #30 with 1

## 2018-04-09 ENCOUNTER — Ambulatory Visit (INDEPENDENT_AMBULATORY_CARE_PROVIDER_SITE_OTHER): Payer: BC Managed Care – PPO | Admitting: Psychology

## 2018-04-09 DIAGNOSIS — F4310 Post-traumatic stress disorder, unspecified: Secondary | ICD-10-CM | POA: Diagnosis not present

## 2018-04-10 ENCOUNTER — Ambulatory Visit (INDEPENDENT_AMBULATORY_CARE_PROVIDER_SITE_OTHER): Payer: BC Managed Care – PPO | Admitting: Neurology

## 2018-04-10 ENCOUNTER — Encounter: Payer: Self-pay | Admitting: Neurology

## 2018-04-10 VITALS — BP 139/89 | HR 105 | Ht 64.0 in | Wt 162.0 lb

## 2018-04-10 DIAGNOSIS — R4789 Other speech disturbances: Secondary | ICD-10-CM

## 2018-04-10 DIAGNOSIS — H532 Diplopia: Secondary | ICD-10-CM | POA: Diagnosis not present

## 2018-04-10 DIAGNOSIS — R000 Tachycardia, unspecified: Secondary | ICD-10-CM

## 2018-04-10 NOTE — Progress Notes (Signed)
Reason for visit: Speech alteration  Referring physician: Dr. Maryjean Ka is a 42 y.o. female  History of present illness:  [name] is a 42 year old right-handed white female with a history of onset of some speech alteration that has occurred within the last 4 months.  The patient herself does not note any change in her speech, those around her have noted that she may mispronounce the name, she may have some slurring of speech, and that her speech is more nasal in quality.  The patient has a sensation that her tongue is swollen or heavy.  She denies any change in swallowing, she has always had trouble swallowing pills.  She does have a history of migraine headaches, she was on Topamax but stopped the medication without any change in her speech.  The patient has been seen previously by Dr. Erling Cruz, she was followed for migraine, and an essential tremor affecting the right arm.  She has a history of restless leg syndrome and fibromyalgia.  The patient reports some blurring of vision that seems to improve when she covers one eye or the other, she does not have overt double vision.  The patient may have visual disturbances with tunnel vision associated with her migraine.  The patient has 2 or 3 migraine headaches a week, she may have on average one severe headache every month or 2.  Past Medical History:  Diagnosis Date  . Allergy    sseasonal  . Anemia    as teenager  . Anxiety   . Depression   . Fibroid   . Fibromyalgia   . Heartburn in pregnancy   . History of fibromyalgia   . Migraines   . Neuromuscular disorder (HCC)    fibromyalgia  . Postpartum care following cesarean delivery 03/17/2012  . Preterm labor   . Status post cesarean delivery (repeat x 2) 03/17/2012  . Urinary tract infection     Past Surgical History:  Procedure Laterality Date  . abnormal cells in colon/tatoo area     2012  . CESAREAN SECTION    . CESAREAN SECTION  03/17/2012   Procedure:  CESAREAN SECTION;  Surgeon: Marvene Staff, MD;  Location: [facility];  Service: Obstetrics;  Laterality: N/A;  Repeat C/S.  EDD: 03/23/12  . COLONOSCOPY    . DILATION AND CURETTAGE OF UTERUS    . POLYPECTOMY    . tubes in ears    . WISDOM TOOTH EXTRACTION      Family History  Problem Relation Age of Onset  . Diabetes Mother   . Diabetes Father   . Diabetes Maternal Grandmother   . Colon polyps Maternal Grandmother   . Diabetes Maternal Grandfather   . Colon polyps Maternal Grandfather   . Heart disease Maternal Grandfather   . Colon cancer Paternal Grandmother   . Heart disease Paternal Grandfather   . Heart failure Unknown        Grandfather  . Heart attack Unknown        x's 4 Grandfather  . Stroke Unknown        x's 1 Grandfather  . Diabetes Unknown   . Diabetes Maternal Aunt     Social history:  reports that she quit smoking about 20 years ago. Her smoking use included cigarettes. She has never used smokeless tobacco. She reports that she does not drink alcohol or use drugs.  Medications:  Prior to Admission medications   Medication Sig Start Date End Date Taking?  Authorizing Provider  acetaminophen (TYLENOL) 325 MG tablet Take 650 mg by mouth every 6 (six) hours as needed.    [provider]  amphetamine-dextroamphetamine (ADDERALL XR) 20 MG 24 hr capsule Take 1 capsule (20 mg total) by mouth every morning. 03/12/18   Midge Minium, MD  Calcium Carbonate-Vit D-Min (CALCIUM 1200 PO) Take 1 capsule by mouth daily.    [provider]  MULTIPLE VITAMIN PO Take 1 capsule by mouth daily.    [provider]  Suvorexant (BELSOMRA) 10 MG TABS Take 1 tablet by mouth at bedtime. 04/07/18   Midge Minium, MD  topiramate (TOPAMAX) 50 MG tablet TAKE 1 TABLET BY MOUTH TWICE A DAY 04/07/18   Midge Minium, MD  vitamin B-12 (CYANOCOBALAMIN) 500 MCG tablet Take 500 mcg by mouth daily.    [provider]      Allergies  Allergen  Reactions  . Aspirin Anaphylaxis    Pt reports being able to take ibuprofen with no problems.  . Codeine Other (See Comments)    halucinations  . Eggs Or Egg-Derived Products Other (See Comments)    Patient states ok with flu shots, etc. No anaphalaxis.  Eggs give stomach cramps with diarrhea. After diarrhea ok.-pt eats baked goods that contains eggs with no issues   . Erythromycin Rash  . Lactose Intolerance (Gi) Diarrhea    Diarrhea with lactose.  Marland Kitchen Penicillins Rash  . Sulfonamide Derivatives Rash    Childhood reaction, has taken recently with only stomach irritation.    ROS:  Out of a complete 14 system review of symptoms, the patient complains only of the following symptoms, and all other reviewed systems are negative.   Fatigue Joint pain, muscle cramps, aching muscles Headache, slurred speech Not enough sleep, decreased energy Restless legs  Blood pressure 139/89, pulse (!) 105, height 5\' 4"  (1.626 m), weight 162 lb (73.5 kg).  Physical Exam  General: The patient is alert and cooperative at the time of the examination.  The patient is mildly obese.  Eyes: Pupils are equal, round, and reactive to light. Discs are flat bilaterally.  Neck: The neck is supple, no carotid bruits are noted.  Respiratory: The respiratory examination is clear.  Cardiovascular: The cardiovascular examination reveals a regular rate and rhythm, no obvious murmurs or rubs are noted.  Skin: Extremities are without significant edema.  Neurologic Exam  Mental status: The patient is alert and oriented x 3 at the time of the examination. The patient has apparent normal recent and remote memory, with an apparently normal attention span and concentration ability.  Cranial nerves: Facial symmetry is present. There is good sensation of the face to pinprick and soft touch bilaterally. The strength of the facial muscles and the muscles to head turning and shoulder shrug are normal bilaterally. Speech is  well enunciated, no aphasia or dysarthria is noted. Extraocular movements are full. Visual fields are full. The tongue is midline, and the patient has symmetric elevation of the soft palate. No obvious hearing deficits are noted.  Motor: The motor testing reveals 5 over 5 strength of all 4 extremities. Good symmetric motor tone is noted throughout.  Sensory: Sensory testing is intact to pinprick, soft touch, vibration sensation, and position sense on all 4 extremities. No evidence of extinction is noted.  Coordination: Cerebellar testing reveals good finger-nose-finger and heel-to-shin bilaterally.  Gait and station: Gait is normal. Tandem gait is normal. Romberg is negative. No drift is seen.  Reflexes: Deep tendon reflexes  are symmetric and normal bilaterally. Toes are downgoing bilaterally.   Assessment/Plan:  1.  Reported speech disturbance  2.  Blurred vision, double vision  3.  Intractable migraine headache  The patient reports some changes in speech that are not apparent to the examiner, she has had some sensation that her tongue is swollen.  The patient has had some mild double vision issues, history of migraine.  The patient will undergo blood work today, MRI of the brain will be done with and without gadolinium enhancement.  Demyelinating disease, metabolic disturbance such as thyroid disease or even myasthenia gravis need to be considered.  If the speech changes progressed over time, bulbar onset ALS should be consideration.  The patient will be followed over time, she will follow-up in about 4 or 5 months.  Jill Alexanders MD 04/10/2018 10:27 AM  [facility] [address] [place], [place] [address]  Phone [phone] Fax [phone]

## 2018-04-13 ENCOUNTER — Encounter: Payer: Self-pay | Admitting: Family Medicine

## 2018-04-14 ENCOUNTER — Telehealth: Payer: Self-pay | Admitting: Neurology

## 2018-04-14 ENCOUNTER — Ambulatory Visit (INDEPENDENT_AMBULATORY_CARE_PROVIDER_SITE_OTHER): Payer: BC Managed Care – PPO | Admitting: Psychology

## 2018-04-14 DIAGNOSIS — F4310 Post-traumatic stress disorder, unspecified: Secondary | ICD-10-CM

## 2018-04-14 LAB — RHEUMATOID FACTOR

## 2018-04-14 LAB — ACETYLCHOLINE RECEPTOR, BINDING: AChR Binding Ab, Serum: <0.03 nmol/L (ref 0.00–0.24)

## 2018-04-14 LAB — ANGIOTENSIN CONVERTING ENZYME: ANGIO CONVERT ENZYME: 20 U/L (ref 14–82)

## 2018-04-14 LAB — ANA W/REFLEX: ANA: NEGATIVE

## 2018-04-14 LAB — T4, FREE: Free T4: 1.32 ng/dL (ref 0.82–1.77)

## 2018-04-14 LAB — TSH: TSH: 1.92 u[IU]/mL (ref 0.450–4.500)

## 2018-04-14 LAB — B. BURGDORFI ANTIBODIES: Lyme IgG/IgM Ab: <0.91 {ISR} (ref 0.00–0.90)

## 2018-04-14 LAB — SEDIMENTATION RATE: Sed Rate: 9 mm/hr (ref 0–32)

## 2018-04-14 LAB — T3, FREE: T3 FREE: 3.7 pg/mL (ref 2.0–4.4)

## 2018-04-14 MED ORDER — AMPHETAMINE-DEXTROAMPHET ER 20 MG PO CP24: 20.0000 mg | ORAL_CAPSULE | ORAL | 0 refills | Status: DC

## 2018-04-14 NOTE — Telephone Encounter (Signed)
MR Brain w/wo contrast Dr. Stephanie Acre Auth: [id number] (exp. 04/10/18 to 05/09/18). Patient is scheduled at [facility] for 04/15/18.

## 2018-04-14 NOTE — Addendum Note (Signed)
Addended by: Midge Minium on: 04/14/2018 02:15 PM   Modules accepted: Orders

## 2018-04-15 ENCOUNTER — Ambulatory Visit: Payer: BC Managed Care – PPO

## 2018-04-15 DIAGNOSIS — R4789 Other speech disturbances: Secondary | ICD-10-CM | POA: Diagnosis not present

## 2018-04-15 DIAGNOSIS — H532 Diplopia: Secondary | ICD-10-CM | POA: Diagnosis not present

## 2018-04-15 MED ORDER — GADOBENATE DIMEGLUMINE 529 MG/ML IV SOLN
15.0000 mL | Freq: Once | INTRAVENOUS | Status: AC | PRN
  Administered 2018-04-15: 15 mL via INTRAVENOUS

## 2018-04-17 ENCOUNTER — Telehealth: Payer: Self-pay | Admitting: Neurology

## 2018-04-17 NOTE — Telephone Encounter (Signed)
I called the patient.  MRI of the brain was completely normal.  The patient will follow-up in 4 or 5 months, she will call sooner if her speech changes are more prominent.

## 2018-04-21 ENCOUNTER — Ambulatory Visit: Payer: BC Managed Care – PPO | Admitting: Psychology

## 2018-05-05 ENCOUNTER — Ambulatory Visit (INDEPENDENT_AMBULATORY_CARE_PROVIDER_SITE_OTHER): Payer: BC Managed Care – PPO | Admitting: Psychology

## 2018-05-05 DIAGNOSIS — F4320 Adjustment disorder, unspecified: Secondary | ICD-10-CM

## 2018-05-13 ENCOUNTER — Encounter: Payer: Self-pay | Admitting: Family Medicine

## 2018-05-13 MED ORDER — AMPHETAMINE-DEXTROAMPHET ER 20 MG PO CP24: 20.0000 mg | ORAL_CAPSULE | ORAL | 0 refills | Status: DC

## 2018-05-13 NOTE — Telephone Encounter (Signed)
Last OV 01/20/18 adderall last filled 04/14/18 #30 with 0

## 2018-05-19 ENCOUNTER — Ambulatory Visit (INDEPENDENT_AMBULATORY_CARE_PROVIDER_SITE_OTHER): Payer: BC Managed Care – PPO | Admitting: Psychology

## 2018-05-19 ENCOUNTER — Ambulatory Visit: Payer: BC Managed Care – PPO | Admitting: Psychology

## 2018-05-19 DIAGNOSIS — F4320 Adjustment disorder, unspecified: Secondary | ICD-10-CM

## 2018-05-21 ENCOUNTER — Ambulatory Visit (INDEPENDENT_AMBULATORY_CARE_PROVIDER_SITE_OTHER): Payer: BC Managed Care – PPO | Admitting: Psychology

## 2018-05-21 DIAGNOSIS — F4310 Post-traumatic stress disorder, unspecified: Secondary | ICD-10-CM

## 2018-06-02 ENCOUNTER — Ambulatory Visit (INDEPENDENT_AMBULATORY_CARE_PROVIDER_SITE_OTHER): Payer: BC Managed Care – PPO | Admitting: Psychology

## 2018-06-02 DIAGNOSIS — F4320 Adjustment disorder, unspecified: Secondary | ICD-10-CM

## 2018-06-04 ENCOUNTER — Encounter: Payer: Self-pay | Admitting: Family Medicine

## 2018-06-04 ENCOUNTER — Ambulatory Visit (INDEPENDENT_AMBULATORY_CARE_PROVIDER_SITE_OTHER): Payer: BC Managed Care – PPO | Admitting: Psychology

## 2018-06-04 DIAGNOSIS — F4310 Post-traumatic stress disorder, unspecified: Secondary | ICD-10-CM | POA: Diagnosis not present

## 2018-06-09 ENCOUNTER — Encounter: Payer: Self-pay | Admitting: Family Medicine

## 2018-06-09 ENCOUNTER — Ambulatory Visit: Payer: BC Managed Care – PPO | Admitting: Family Medicine

## 2018-06-09 ENCOUNTER — Other Ambulatory Visit: Payer: Self-pay

## 2018-06-09 VITALS — BP 112/73 | HR 83 | Temp 98.3°F | Resp 16 | Ht 64.0 in | Wt 165.1 lb

## 2018-06-09 DIAGNOSIS — F900 Attention-deficit hyperactivity disorder, predominantly inattentive type: Secondary | ICD-10-CM | POA: Diagnosis not present

## 2018-06-09 DIAGNOSIS — R4789 Other speech disturbances: Secondary | ICD-10-CM | POA: Diagnosis not present

## 2018-06-09 DIAGNOSIS — E041 Nontoxic single thyroid nodule: Secondary | ICD-10-CM | POA: Diagnosis not present

## 2018-06-09 DIAGNOSIS — R220 Localized swelling, mass and lump, head: Secondary | ICD-10-CM

## 2018-06-09 MED ORDER — AMPHETAMINE-DEXTROAMPHET ER 20 MG PO CP24: 20.0000 mg | ORAL_CAPSULE | ORAL | 0 refills | Status: DC

## 2018-06-09 MED ORDER — EPINEPHRINE 0.3 MG/0.3ML IJ SOAJ: 0.3000 mg | INTRAMUSCULAR | 1 refills | Status: AC | PRN

## 2018-06-09 NOTE — Patient Instructions (Addendum)
Follow up as needed or as scheduled We'll call you with your Ultrasound appt to assess the thyroid nodule We'll call you with your Speech appt We'll call you with your allergy appt START daily Claritin or Zyrtec daily to decrease the histamine response USE the Epipen if you again have swelling of mouth or tongue and then go to ER Call with any questions or concerns Hang in there!  We'll figure this out!!

## 2018-06-09 NOTE — Assessment & Plan Note (Signed)
Chronic problem.  Database reviewed and prescription filled.

## 2018-06-09 NOTE — Assessment & Plan Note (Signed)
Pt has hx of this.  Repeat US to assess for growth/change

## 2018-06-09 NOTE — Progress Notes (Signed)
   Subjective:    Patient ID: Abigail Singh, female    DOB: 1975/10/26, 43 y.o.   MRN: [id number]  HPI Speech issues- pt saw Neuro and had MRI of brain and optic nerve.  Pt reports speech is worse at night, children are starting to notice and correct her.  Neuro weaned her off Topamax.  Pt reports that her mouth will move differently at the end of the day.  Words will jumble at time.  Neuro is considering myasthenia gravis  Thyroid nodule- pt has not had imaging since 2015.  Asking for repeat imaging.  ADHD- pt is due for Adderall refill.  Tongue swelling- occurred on Thursday.  She ate a Lemon girl scout cookie prior to tongue swelling.  Took benadryl w/ some improvement- took 24 hrs to resolve.  Some difficulty w/ swallowing.  Developed ulcers on lateral edge of tongue.  Pt reports she has reacted to oranges previously.   Review of Systems For ROS see HPI     Objective:   Physical Exam Vitals signs reviewed.  Constitutional:      General: She is not in acute distress.    Appearance: Normal appearance. She is not ill-appearing.  HENT:     Head: Normocephalic and atraumatic.     Mouth/Throat:     Mouth: Mucous membranes are moist.     Pharynx: Oropharynx is clear. No oropharyngeal exudate or posterior oropharyngeal erythema.  Neck:     Musculoskeletal: Normal range of motion and neck supple.     Thyroid: No thyromegaly or thyroid tenderness.  Skin:    General: Skin is warm and dry.  Neurological:     Mental Status: She is alert.     Comments: Some jumble of words during otherwise normal conversation  Psychiatric:        Mood and Affect: Mood normal.        Behavior: Behavior normal.        Thought Content: Thought content normal.           Assessment & Plan:  Change in speech- pt is seeing neuro for this but sxs are not improving and now children are noticing a change.  Question whether she has myasthenia gravis but this is likely being worked up by Designer, fashion/clothing.  Refer to  SLP.  Pt expressed understanding and is in agreement w/ plan.   Tongue swelling- new.  It sounds as if pt had mild anaphylactic rxn but exact cause is unknown.  Start daily antihistamine.  Epipen prescribed.  Allergy referral placed.  Pt expressed understanding and is in agreement w/ plan.

## 2018-06-11 ENCOUNTER — Ambulatory Visit (HOSPITAL_BASED_OUTPATIENT_CLINIC_OR_DEPARTMENT_OTHER)
Admission: RE | Admit: 2018-06-11 | Discharge: 2018-06-11 | Disposition: A | Discharge status: Home / Self Care | Payer: BC Managed Care – PPO | Source: Ambulatory Visit | Attending: Family Medicine | Admitting: Family Medicine

## 2018-06-11 DIAGNOSIS — E041 Nontoxic single thyroid nodule: Secondary | ICD-10-CM | POA: Diagnosis present

## 2018-06-13 ENCOUNTER — Ambulatory Visit (INDEPENDENT_AMBULATORY_CARE_PROVIDER_SITE_OTHER): Payer: BC Managed Care – PPO | Admitting: Psychology

## 2018-06-13 DIAGNOSIS — F4310 Post-traumatic stress disorder, unspecified: Secondary | ICD-10-CM

## 2018-06-16 ENCOUNTER — Ambulatory Visit (INDEPENDENT_AMBULATORY_CARE_PROVIDER_SITE_OTHER): Payer: BC Managed Care – PPO | Admitting: Psychology

## 2018-06-16 DIAGNOSIS — F4310 Post-traumatic stress disorder, unspecified: Secondary | ICD-10-CM | POA: Diagnosis not present

## 2018-06-18 ENCOUNTER — Ambulatory Visit (INDEPENDENT_AMBULATORY_CARE_PROVIDER_SITE_OTHER): Payer: BC Managed Care – PPO | Admitting: Psychology

## 2018-06-18 DIAGNOSIS — F4310 Post-traumatic stress disorder, unspecified: Secondary | ICD-10-CM

## 2018-06-24 ENCOUNTER — Encounter: Payer: Self-pay | Admitting: Pediatrics

## 2018-06-24 ENCOUNTER — Ambulatory Visit (INDEPENDENT_AMBULATORY_CARE_PROVIDER_SITE_OTHER): Payer: BC Managed Care – PPO | Admitting: Pediatrics

## 2018-06-24 ENCOUNTER — Encounter: Payer: Self-pay | Admitting: Family Medicine

## 2018-06-24 VITALS — BP 98/70 | HR 106 | Temp 98.2°F | Resp 18 | Ht 63.0 in | Wt 164.7 lb

## 2018-06-24 DIAGNOSIS — R1319 Other dysphagia: Secondary | ICD-10-CM | POA: Diagnosis not present

## 2018-06-24 DIAGNOSIS — H1010 Acute atopic conjunctivitis, unspecified eye: Secondary | ICD-10-CM | POA: Diagnosis not present

## 2018-06-24 DIAGNOSIS — T7800XD Anaphylactic reaction due to unspecified food, subsequent encounter: Secondary | ICD-10-CM

## 2018-06-24 DIAGNOSIS — J3089 Other allergic rhinitis: Secondary | ICD-10-CM | POA: Diagnosis not present

## 2018-06-24 DIAGNOSIS — T7800XA Anaphylactic reaction due to unspecified food, initial encounter: Secondary | ICD-10-CM | POA: Insufficient documentation

## 2018-06-24 MED ORDER — FLUTICASONE PROPIONATE 50 MCG/ACT NA SUSP: NASAL | 5 refills | Status: DC

## 2018-06-24 MED ORDER — OLOPATADINE HCL 0.7 % OP SOLN: 1.0000 [drp] | OPHTHALMIC | 5 refills | Status: DC | PRN

## 2018-06-24 NOTE — Progress Notes (Signed)
[place] [id number] Dept: [phone]  New Patient Note  Patient ID: Abigail Singh, female    DOB: 12/29/1975  Age: 43 y.o. MRN: [id number] Date of Office Visit: 06/24/2018 Referring provider: Midge Minium, MD [address] [place], [place] [address]    Chief Complaint: Food Intolerance (pt. ate a girl scout cookie, it was a lemonade and pt.started experiencing tongue swelling 15 minutes within eating it. pt. states thats the only thing new she had eaten. Pt.'s mom stated she couldn't eat oranges when she was little not sure of her reactions. If pt. eats oranges now her stomach hurts and has diarrhea.) and Nasal Congestion  HPI Abigail Singh presents for evaluation of a reaction which occurred on January 30 of this year.  She ate a lemon Girl Scout cookie and within 10 minutes noted some tongue swelling and hurting of her throat.  20 minutes later she felt that her throat was swelling.  She took Benadryl and over 24 hours her symptoms improved.  24 hours later she had some ulceration in the inside of her tongue.  She has noted some ulcerations in her tongue several times in the past few years.  She has a lactose intolerance with milk and ice cream but can tolerate cheese.  She can eat foods cooked with egg but otherwise eggs give her diarrhea.  As a child she had a rash from orange and raspberries.  She has had some swelling of her throat from aspirin but she can take ibuprofen without any problems.  She has a history of eczema.  She does not have gastroesophageal reflux.  She has never had asthmatic symptoms.. She has not had chronic urticaria.  She has not had tick bites  At times she has noted some difficulty swallowing depending on how she positions her neck.  She has been evaluated for double vision at times, paresthesias, difficulties with her speech, poor balance.  She is being evaluated for myasthenia gravis.  She has a history of fibromyalgia   Review of  Systems  Constitutional: Negative.   HENT:       Rhinitis since childhood.  Swelling of her tongue and throat after eating eating lemon Girl Scout cookie  Eyes:       Double vision at times  Respiratory: Negative.   Cardiovascular: Negative.   Gastrointestinal:       Difficulty swallowing at times.  Abdominal pain from eggs, milk , ice cream  Genitourinary: Negative.   Musculoskeletal:       Fibromyalgia  Skin:       Rash  in childhood from orange and raspberries .  History of eczema  Neurological:       Numbness at times  Endo/Heme/Allergies:       No diabetes or thyroid disease  Psychiatric/Behavioral: Negative.     Outpatient Encounter Medications as of 06/24/2018  Medication Sig  . acetaminophen (TYLENOL) 325 MG tablet Take 650 mg by mouth every 6 (six) hours as needed.  Marland Kitchen amphetamine-dextroamphetamine (ADDERALL XR) 20 MG 24 hr capsule Take 1 capsule (20 mg total) by mouth every morning.  . Calcium Carbonate-Vit D-Min (CALCIUM 1200 PO) Take 1 capsule by mouth daily.  Marland Kitchen EPINEPHrine 0.3 mg/0.3 mL IJ SOAJ injection Inject 0.3 mLs (0.3 mg total) into the muscle as needed for anaphylaxis.  Marland Kitchen MULTIPLE VITAMIN PO Take 1 capsule by mouth daily.  . Suvorexant (BELSOMRA) 10 MG TABS Take 1 tablet by mouth at  bedtime.  . vitamin B-12 (CYANOCOBALAMIN) 500 MCG tablet Take 500 mcg by mouth daily.  . fluticasone (FLONASE) 50 MCG/ACT nasal spray 2 sprays per nostril once a day if needed for stuffy nose.  Marland Kitchen Olopatadine HCl (PAZEO) 0.7 % SOLN Place 1 drop into both eyes as needed (at least 10 minutes before putting in the contact lenses if needed for itchy eyes).   No facility-administered encounter medications on file as of 06/24/2018.      Drug Allergies:  Allergies  Allergen Reactions  . Aspirin Anaphylaxis    Pt reports being able to take ibuprofen with no problems.  . Codeine Other (See Comments)    halucinations  . Eggs Or Egg-Derived Products Other (See Comments)    Patient states  ok with flu shots, etc. No anaphalaxis.  Eggs give stomach cramps with diarrhea. After diarrhea ok.-pt eats baked goods that contains eggs with no issues   . Erythromycin Rash  . Lactose Intolerance (Gi) Diarrhea    Diarrhea with lactose.  Marland Kitchen Penicillins Rash  . Sulfonamide Derivatives Rash    Childhood reaction, has taken recently with only stomach irritation.    Family History: [name]'s family history includes Allergic rhinitis in her daughter; Asthma in her daughter; Colon cancer in her paternal grandmother; Colon polyps in her maternal grandfather and maternal grandmother; Diabetes in her father, maternal aunt, maternal grandfather, maternal grandmother, mother, and another family member; Eczema in her daughter; Food Allergy in her father; Heart attack in an other family member; Heart disease in her maternal grandfather and paternal grandfather; Heart failure in an other family member; Stroke in an other family member; Urticaria in her daughter..  Family history is negative for angioedema , food allergies, lupus, chronic bronchitis or emphysema.  Social and environmental.  There is a dog in the home.  She is not exposed to cigarette smoking She used to smoke cigarettes but quit 20 years ago.  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  Constitutional:      Appearance: Normal appearance. She is normal weight.  HENT:     Head:     Comments: Eyes normal.  Ears normal.  Nose normal.  Pharynx normal. Neck:     Musculoskeletal: Neck supple.     Comments: No thyromegaly Cardiovascular:     Comments: S1-S2 normal no murmurs Pulmonary:     Comments: Clear to percussion and auscultation Abdominal:     Palpations:  Abdomen is soft.     Tenderness: There is no abdominal tenderness.     Comments: No hepatosplenomegaly  Lymphadenopathy:     Cervical: No cervical adenopathy.  Skin:    Comments: Clear  Neurological:     General: No focal deficit present.     Mental Status: She is alert and oriented to person, place, and time.  Psychiatric:        Mood and Affect: Mood normal.        Behavior: Behavior normal.        Thought Content: Thought content normal.        Judgment: Judgment normal.     Diagnostics: Allergy  skin test were positive to grass pollens, weed pollen, dust mite, cat.  Minimal reactivity to dog on intradermal testing only   Assessment  Assessment and Plan: 1. Other allergic rhinitis   2. Anaphylactic shock due to food, subsequent encounter   3. Seasonal allergic conjunctivitis   4. Other dysphagia     Meds ordered this encounter  Medications  . fluticasone (FLONASE) 50 MCG/ACT nasal spray    Sig: 2 sprays per nostril once a day if needed for stuffy nose.    Dispense:  16 g    Refill:  5  . Olopatadine HCl (PAZEO) 0.7 % SOLN    Sig: Place 1 drop into both eyes as needed (at least 10 minutes before putting in the contact lenses if needed for itchy eyes).    Dispense:  1 Bottle    Refill:  5    Patient Instructions  Environmental control of dust mite Zyrtec 10 mg-take 1 tablet once a day for runny nose or itchy eyes Fluticasone 2 sprays per nostril once a day if needed for stuffy nose Pazeo 0.7% -1 drop in each eye at least 10 minutes before putting in the contact lenses if needed for itchy eyes  Call us if you are not doing well on this treatment plan Continue on your other medications  Avoid the lemon Girl Scout cookie and aspirin.  If you have an allergic reaction take Benadryl 4 teaspoonfuls every 4 hours and if you have life-threatening symptoms inject with EpiPen 0.3 mg.  Then write what you had to eat or drink the previous 4 hours Avoid large amounts of foods  with citric acid at the same time If you continue to have difficulty swallowing, a barium swallow would be helpful You appear to have a lactose intolerance and an intolerance to eggs when not baked in foods   Return in about 4 weeks (around 07/22/2018).   Thank you for the opportunity to care for this patient.  Please do not hesitate to contact me with questions.  Penne Lash, M.D.  [facility] [address] [place], [place] [address] [phone]

## 2018-06-24 NOTE — Patient Instructions (Addendum)
Environmental control of dust mite Zyrtec 10 mg-take 1 tablet once a day for runny nose or itchy eyes Fluticasone 2 sprays per nostril once a day if needed for stuffy nose Pazeo 0.7% -1 drop in each eye at least 10 minutes before putting in the contact lenses if needed for itchy eyes  Call us if you are not doing well on this treatment plan Continue on your other medications  Avoid the lemon Girl Scout cookie and aspirin.  If you have an allergic reaction take Benadryl 4 teaspoonfuls every 4 hours and if you have life-threatening symptoms inject with EpiPen 0.3 mg.  Then write what you had to eat or drink the previous 4 hours Avoid large amounts of foods with citric acid at the same time If you continue to have difficulty swallowing, a barium swallow would be helpful You appear to have a lactose intolerance and an intolerance to eggs when not baked in foods

## 2018-06-26 NOTE — Telephone Encounter (Signed)
[facility] states they will call me back with info on pt referral.

## 2018-06-30 ENCOUNTER — Ambulatory Visit (INDEPENDENT_AMBULATORY_CARE_PROVIDER_SITE_OTHER): Payer: BC Managed Care – PPO | Admitting: Psychology

## 2018-06-30 DIAGNOSIS — F4310 Post-traumatic stress disorder, unspecified: Secondary | ICD-10-CM

## 2018-07-02 ENCOUNTER — Ambulatory Visit (INDEPENDENT_AMBULATORY_CARE_PROVIDER_SITE_OTHER): Payer: BC Managed Care – PPO | Admitting: Psychology

## 2018-07-02 DIAGNOSIS — F4310 Post-traumatic stress disorder, unspecified: Secondary | ICD-10-CM

## 2018-07-09 ENCOUNTER — Ambulatory Visit (INDEPENDENT_AMBULATORY_CARE_PROVIDER_SITE_OTHER): Payer: BC Managed Care – PPO | Admitting: Psychology

## 2018-07-09 DIAGNOSIS — F4310 Post-traumatic stress disorder, unspecified: Secondary | ICD-10-CM

## 2018-07-11 ENCOUNTER — Encounter: Payer: Self-pay | Admitting: Family Medicine

## 2018-07-11 MED ORDER — AMPHETAMINE-DEXTROAMPHET ER 20 MG PO CP24: 20.0000 mg | ORAL_CAPSULE | ORAL | 0 refills | Status: DC

## 2018-07-11 NOTE — Telephone Encounter (Signed)
Last OV 06/09/18 adderall last filled 06/09/18 #30 with 0

## 2018-07-14 ENCOUNTER — Other Ambulatory Visit: Payer: Self-pay

## 2018-07-14 ENCOUNTER — Ambulatory Visit: Payer: BC Managed Care – PPO | Admitting: Psychology

## 2018-07-14 ENCOUNTER — Ambulatory Visit: Payer: BC Managed Care – PPO | Attending: Family Medicine | Admitting: Speech Pathology

## 2018-07-14 DIAGNOSIS — R4789 Other speech disturbances: Secondary | ICD-10-CM

## 2018-07-16 ENCOUNTER — Ambulatory Visit (INDEPENDENT_AMBULATORY_CARE_PROVIDER_SITE_OTHER): Payer: BC Managed Care – PPO | Admitting: Psychology

## 2018-07-16 ENCOUNTER — Other Ambulatory Visit: Payer: Self-pay

## 2018-07-16 DIAGNOSIS — F4310 Post-traumatic stress disorder, unspecified: Secondary | ICD-10-CM

## 2018-07-16 NOTE — Therapy (Signed)
[facility] [address] [place], [place], [address] Phone: [phone]   Fax:  [phone]  Speech Language Pathology Evaluation  Patient Details  Name: Abigail Singh MRN: [id number] Date of Birth: 1976/03/22 Referring Provider (SLP): Annye Asa, MD   Encounter Date: 07/14/2018  End of Session - 07/16/18 1316    Visit Number  1    Number of Visits  9    Date for SLP Re-Evaluation  09/12/18    Authorization Type  BCBS    SLP Start Time  1615    SLP Stop Time   1700    SLP Time Calculation (min)  45 min    Activity Tolerance  Patient tolerated treatment well       Past Medical History:  Diagnosis Date  . Allergy    sseasonal  . Anemia    as teenager  . Anxiety   . Depression   . Fibroid   . Fibromyalgia   . Heartburn in pregnancy   . History of fibromyalgia   . Migraines   . Neuromuscular disorder (HCC)    fibromyalgia  . Postpartum care following cesarean delivery 03/17/2012  . Preterm labor   . Status post cesarean delivery (repeat x 2) 03/17/2012  . Urinary tract infection     Past Surgical History:  Procedure Laterality Date  . abnormal cells in colon/tatoo area     2012  . CESAREAN SECTION    . CESAREAN SECTION  03/17/2012   Procedure: CESAREAN SECTION;  Surgeon: Marvene Staff, MD;  Location: [facility];  Service: Obstetrics;  Laterality: N/A;  Repeat C/S.  EDD: 03/23/12  . COLONOSCOPY    . DILATION AND CURETTAGE OF UTERUS    . POLYPECTOMY    . tubes in ears    . WISDOM TOOTH EXTRACTION      There were no vitals filed for this visit.  Subjective Assessment - 07/16/18 1212    Subjective  "I have a list." Pt takes out list of speech changes told to her by friends/family. "I can't hear it unless I listen to a recording"         SLP Evaluation [facility] - 07/16/18 1212      SLP Visit Information   SLP Received On  07/14/18    Referring Provider (SLP)  Annye Asa, MD     Onset Date  06/09/18    Medical Diagnosis  speech disturbance      Pain Assessment   Currently in Pain?  No/denies      General Information   HPI  Pt is a 43 year old female referred for speech disturbance. History of migraines, essential tremor affecting the right arm, restless leg syndrome, fibromyalgia. Recent reported episode of angioedema after eating a [employer] cookie. Neurology also following, considering demyelinating disease, metabolic disturbance, myasthenia gravis, although MD does not appreciate speech changes. MRI normal, bloodwork unremarkable. Pt reports decreased balance, fall yesterday when opening her car door.     Behavioral/Cognition  alert, cooperative    Mobility Status  ambulated to session      Balance Screen   Has the patient fallen in the past 6 months  Yes    How many times?  2   reports tripping, "my balance is off"   Has the patient had a decrease in activity level because of a fear of falling?   No    Is the patient reluctant to leave their home because of a fear  of falling?   No      Prior Functional Status   Cognitive/Linguistic Baseline  Within functional limits      Auditory Comprehension   Overall Auditory Comprehension  Appears within functional limits for tasks assessed      Visual Recognition/Discrimination   Discrimination  Within Function Limits      Reading Comprehension   Reading Status  Within funtional limits      Expression   Primary Mode of Expression  Verbal      Verbal Expression   Overall Verbal Expression  Appears within functional limits for tasks assessed    Other Verbal Expression Comments  Pt: "sometimes the wrong word comes out." No dysnomia appreciated today      Written Expression   Dominant Hand  Right    Written Expression  Not tested      Oral Motor/Sensory Function   Overall Oral Motor/Sensory Function  Appears within functional limits for tasks assessed    Labial ROM  Within Functional Limits    Labial Symmetry   Within Functional Limits    Labial Strength  Within Functional Limits    Labial Sensation  Within Functional Limits    Labial Coordination  WFL    Lingual ROM  Within Functional Limits    Lingual Symmetry  Within Functional Limits    Lingual Strength  Within Functional Limits    Lingual Coordination  WFL    Facial ROM  Within Functional Limits    Facial Symmetry  Within Functional Limits    Facial Strength  Within Functional Limits    Facial Coordination  WFL    Velum  Within Functional Limits    Mandible  Within Functional Limits    Overall Oral Motor/Sensory Function  normal oral motor examination      Motor Speech   Overall Motor Speech  Appears within functional limits for tasks assessed    Respiration  Within functional limits    Phonation  Normal;Other (comment)   intermittent hoarseness/ glottal fry   Resonance  Within functional limits   some atypical nasalized vowels   Articulation  Within functional limitis    Intelligibility  Intelligible    Motor Planning  Witnin functional limits    Motor Speech Errors  Inconsistent;Unaware    Effective Techniques  --   Oral resonance   Phonation  WFL    Volume  Appropriate    Pitch  Appropriate      Standardized Assessments   Standardized Assessments   Other Assessment   V-RQOL score is 77.5 (>80 is WNL). Maximum phonation time is 11.7 seconds (>14 is WNL). /s/ to /z/ ratio is 2.15 (>1.4 may indicate laryngeal pathology). Oral motor examination is unremarkable; adequate strength, range of motion, and coordination. No focal weakness or tremors. Pt reports speech is worse later in the day or if she drinks too much coffee. No changes in swallowing ("I've never been able to swallow a pill." "I can't swallow at all when my head is back"). Speech is fluent without dysarthria or word-finding difficulties appreciated. Glottal fry, throaty focus appreciated in vocal quality. Intermittently, pt did have some minor vowel distortions. The most  common distortion was inconsistent nasalization of a vowel  (ex: "did" "close" "so" "play"). Also noted vowel replacement: nansense/nonsense, und/and, ond/and). Pt played SLP a video recording of her speech prior to reported changes where these were not noted.   SLP worked with pt on oral resonance techniques, using nasalized nonsense words and progressing  to words and short phrases. Pt stimulable for oral resonance with demonstration and min cues; vocal quality within normal limits and no vowel distortions appreciated during this diagnostic intervention.         SLP Education - 07/16/18 1317    Education Details  oral resonance    Person(s) Educated  Patient    Methods  Explanation;Handout;Demonstration;Verbal cues    Comprehension  Verbalized understanding;Returned demonstration;Need further instruction       SLP Short Term Goals - 07/16/18 1301      SLP SHORT TERM GOAL #1   Title  Pt will use oral resonance for [facility] vocal quality in 18/20 sentences x 2 visits.    Time  4    Period  Weeks    Status  New      SLP SHORT TERM GOAL #2   Title  Pt will use abdominal breathing in 5 minutes simple conversation x2 visits.    Time  4    Period  Weeks    Status  New       SLP Long Term Goals - 07/16/18 1304      SLP LONG TERM GOAL #1   Title  Pt will report fewer communication breakdowns/requests to repeat herself x 2 sessions.    Time  8    Period  Weeks    Status  New      SLP LONG TERM GOAL #2   Title  Pt's voice related quality of life (V-RQOL) will improve to >/= 80 (>80 is WNL)    Time  8    Period  Weeks    Status  New      SLP LONG TERM GOAL #3   Title  Pt will report receiving fewer inquiries/negative comments about her speech.    Time  8    Period  Weeks    Status  New       Plan - 07/16/18 1256    Clinical Impression Statement  Patient presents with speech disturbance of unknown etiology; based on reported symptoms, would consider neurologic cause or  conversion disorder. Reported non-speech symptoms include decreased balance, double vision, numbness/tingling in hands and feet. SLP appreciated inconsistent vowel distortions intermittently in connected speech, typically nasalized. Pt also has a throaty/glottal-focused resonance. Pt arrived with a list of feedback she has received from others. She states she isn't able to hear the changes in her speech unless she hears a recording, but can sometimes feel it "like my tongue swollen or moving differently." When pt listened to a recording of her speech today, she rated her speech as "6 or 7" /10, with 10 being her normal speech. Per pt, she began receiving feedback about changes in her speech approximately 2-3 years ago. 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 "People say, 'Has your accent changed?'" "People that don't know me say, 'What is that accent?'" She also receives comments that her voice sounds "gutteral" and "hoarse." Her sister tells her "the way I hold my mouth is misaligned." Others say "I enunciate certain words, click or smack my tongue between words with some slight slurring." Pt reports decreased quality of life due to others' perceptions, feeling anxious, frustrated and embarrassed due to problems with her speech. I recommend skilled ST to train pt in techniques to adjust resonance and increase control/efficiency of respiration and phonation, in order to improve patient's voice and speech-related  quality of life. MD may wish to consider PT referral should pt report continued falls and balance difficulties.     Speech Therapy Frequency  1x /week    Duration  --   8 weeks or 9 visits total   Treatment/Interventions  Cueing hierarchy;Functional tasks;SLP instruction and feedback;Compensatory strategies;Patient/family education    Potential to Achieve Goals  Good    SLP Home Exercise Plan   oral resonance exercises provided    Consulted and Agree with Plan of Care  Patient       Patient will benefit from skilled therapeutic intervention in order to improve the following deficits and impairments:   Other speech disturbance    Problem List Patient Active Problem List   Diagnosis Date Noted  . 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 Seasonal allergic conjunctivitis 06/24/2018  . Other allergic rhinitis 06/24/2018  . Other dysphagia 06/24/2018  . Visit for preventive health examination 12/05/2017  . Fibromyalgia 12/05/2017  . Vitamin D deficiency 11/26/2016  . Insomnia 11/26/2016  . ADHD 11/26/2016  . Obesity (BMI 30.0-34.9) 01/27/2016  . Migraine 12/29/2014  . Plantar fasciitis, bilateral 11/01/2014  . Weight gain 11/01/2014  . Abdominal cramping 02/05/2014  . Sialoadenitis 10/21/2013  . Physical exam 10/23/2010  . Dysuria 10/13/2010  . FIBROMYALGIA 11/09/2009  . Thyroid nodule 10/21/2009    Deneise Lever, [place], [place] 07/16/2018, 1:17 PM  [facility] [address] [place], [place], [address] Phone: [phone]   Fax:  [phone]  Name: Abigail Singh MRN: [id number] Date of Birth: 1976-03-14

## 2018-07-23 ENCOUNTER — Other Ambulatory Visit: Payer: Self-pay

## 2018-07-23 ENCOUNTER — Ambulatory Visit (INDEPENDENT_AMBULATORY_CARE_PROVIDER_SITE_OTHER): Payer: BC Managed Care – PPO | Admitting: Psychology

## 2018-07-23 DIAGNOSIS — F4310 Post-traumatic stress disorder, unspecified: Secondary | ICD-10-CM | POA: Diagnosis not present

## 2018-07-28 ENCOUNTER — Telehealth: Payer: Self-pay | Admitting: Speech Pathology

## 2018-07-28 ENCOUNTER — Ambulatory Visit (INDEPENDENT_AMBULATORY_CARE_PROVIDER_SITE_OTHER): Payer: BC Managed Care – PPO | Admitting: Psychology

## 2018-07-28 DIAGNOSIS — F4310 Post-traumatic stress disorder, unspecified: Secondary | ICD-10-CM

## 2018-07-28 NOTE — Telephone Encounter (Signed)
Fallynn Gravett was contacted today regarding the temporary closing of OP Rehab Services due to Covid-19.  Therapist discussed:  Pt's concerns about her ability to initiate a dry swallow. SLP reviewed video pt posted to [facility]. Pt denies difficulty eating or drinking, overt signs of aspiration. Pt reporting numbness to "top left inside" of mouth and tongue. Pt states she has always had trouble with pills. SLP suggested pt attempt medications whole in applesauce, pudding, or yogurt should this aid transit. SLP informed pt that as she is not experiencing difficulties eating/drinking or coughing with meals, no immediate concerns for swallowing at this time. Pt has HEP for speech at home and does not have questions about this program.   Patient expressed interested in further information for an e-visit, virtual check in, or telehealth visit, if those services become available.    [facility] Services will follow up with patients when we are able to resume care.  Deneise Lever, [place], [facility] [address] [place], [place]  [address] Phone:  [phone] Fax:  [phone] \

## 2018-07-29 ENCOUNTER — Ambulatory Visit: Payer: BC Managed Care – PPO | Admitting: Speech Pathology

## 2018-07-30 ENCOUNTER — Ambulatory Visit (INDEPENDENT_AMBULATORY_CARE_PROVIDER_SITE_OTHER): Payer: BC Managed Care – PPO | Admitting: Psychology

## 2018-07-30 DIAGNOSIS — F4310 Post-traumatic stress disorder, unspecified: Secondary | ICD-10-CM | POA: Diagnosis not present

## 2018-08-04 ENCOUNTER — Encounter: Payer: Self-pay | Admitting: Speech Pathology

## 2018-08-05 ENCOUNTER — Ambulatory Visit: Payer: BC Managed Care – PPO | Admitting: Pediatrics

## 2018-08-06 ENCOUNTER — Ambulatory Visit (INDEPENDENT_AMBULATORY_CARE_PROVIDER_SITE_OTHER): Payer: BC Managed Care – PPO | Admitting: Psychology

## 2018-08-06 DIAGNOSIS — F4310 Post-traumatic stress disorder, unspecified: Secondary | ICD-10-CM

## 2018-08-07 ENCOUNTER — Telehealth: Payer: Self-pay

## 2018-08-07 NOTE — Telephone Encounter (Signed)
Pt was contacted today regarding temporary reduction of Outpatient [facility] Services due to concerns for community transmission of COVID-19.  Patient identity was verified.  Assessed if patient needed to be seen in person by clinician (recent fall or acute injury that requires hands on assessment and advice, change in diet order, post-surgical, special cases, etc.).    Patient did not have an acute/special need that requires in person visit. Proceeded with phone call.  Therapist assured he/she had no unanswered questions or concerns at this time.  The patient expressed interest in being contacted for an E-Visit, virtual check in, or Telehealth visit to continue their plan of care, when those services become available. [facility] will follow up with patient at that time.  Patient is aware we can be reached by telephone during limited business hours in the meantime.  [place], [place]

## 2018-08-08 ENCOUNTER — Encounter: Payer: Self-pay | Admitting: Family Medicine

## 2018-08-08 DIAGNOSIS — F900 Attention-deficit hyperactivity disorder, predominantly inattentive type: Secondary | ICD-10-CM

## 2018-08-08 MED ORDER — AMPHETAMINE-DEXTROAMPHET ER 20 MG PO CP24: 20.0000 mg | ORAL_CAPSULE | ORAL | 0 refills | Status: DC

## 2018-08-08 NOTE — Telephone Encounter (Signed)
Adderall last rx 07/11/18 #30 LOV: 06/09/18  Please advise of refills

## 2018-08-11 ENCOUNTER — Ambulatory Visit (INDEPENDENT_AMBULATORY_CARE_PROVIDER_SITE_OTHER): Payer: BC Managed Care – PPO | Admitting: Psychology

## 2018-08-11 ENCOUNTER — Encounter: Payer: Self-pay | Admitting: Speech Pathology

## 2018-08-11 DIAGNOSIS — F4310 Post-traumatic stress disorder, unspecified: Secondary | ICD-10-CM

## 2018-08-12 ENCOUNTER — Encounter: Payer: Self-pay | Admitting: Family Medicine

## 2018-08-12 DIAGNOSIS — F900 Attention-deficit hyperactivity disorder, predominantly inattentive type: Secondary | ICD-10-CM

## 2018-08-12 MED ORDER — AMPHETAMINE-DEXTROAMPHET ER 20 MG PO CP24: 20.0000 mg | ORAL_CAPSULE | ORAL | 0 refills | Status: DC

## 2018-08-13 ENCOUNTER — Other Ambulatory Visit: Payer: Self-pay

## 2018-08-13 ENCOUNTER — Ambulatory Visit (INDEPENDENT_AMBULATORY_CARE_PROVIDER_SITE_OTHER): Payer: BC Managed Care – PPO | Admitting: Neurology

## 2018-08-13 ENCOUNTER — Ambulatory Visit (INDEPENDENT_AMBULATORY_CARE_PROVIDER_SITE_OTHER): Payer: BC Managed Care – PPO | Admitting: Psychology

## 2018-08-13 ENCOUNTER — Encounter: Payer: Self-pay | Admitting: Neurology

## 2018-08-13 DIAGNOSIS — F4310 Post-traumatic stress disorder, unspecified: Secondary | ICD-10-CM | POA: Diagnosis not present

## 2018-08-13 DIAGNOSIS — R4789 Other speech disturbances: Secondary | ICD-10-CM | POA: Diagnosis not present

## 2018-08-13 NOTE — Progress Notes (Signed)
     Virtual Visit via Video Note  I connected with Abigail Singh on 08/13/18 at 11:00 AM EDT by a video enabled telemedicine application and verified that I am speaking with the correct person using two identifiers.   I discussed the limitations of evaluation and management by telemedicine and the availability of in person appointments. The patient expressed understanding and agreed to proceed.  History of Present Illness: Abigail Singh is a 43 year old right-handed white female with a history of a perceived alteration in her speech.  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  She is not doing as much talking, there is definitely a fatigue aspect of her speech problem.  She is usually worse in the afternoons or evenings.  She reports some slurring of speech but she also will make paraphasic errors with her words.  She denies any significant issues with choking with swallowing.  She may wake up in the middle the night at times with a sensation that she cannot swallow, she may have some anxiety or nervousness with this and if she touches her lips or face or mouth she is able to swallow.  She does not note any numbness or weakness of extremities.  She has had MRI of the brain is normal.  Blood work was normal.  She recently saw a speech therapist who basically noted a fairly normal examination.  The patient no longer has problems with double vision, she has bifocal glasses which she uses now which have helped.  The patient is being followed for the speech alteration.   Observations/Objective: WebEx evaluation reveals that the patient has full extraocular movements.  Facial symmetry is present.  Speech appears to be well enunciated, not aphasic or dysarthric.  The patient is able to protrude the tongue in the midline with good rapid lateral movements of the tongue seen.  Assessment and Plan: 1.  Perceived speech alteration  The patient appears  to have normal speech during medical examinations, but she does report some undulation in her difficulties, usually worse in the afternoons or evenings.  The patient will be followed conservatively, if the patient develops true dysarthria, we may consider EMG nerve conduction study to evaluate for an anterior horn cell disease process.  The patient will be seen in another 6 months.  Follow Up Instructions: Follow-up with me in 6 months.   I discussed the assessment and treatment plan with the patient. The patient was provided an opportunity to ask questions and all were answered. The patient agreed with the plan and demonstrated an understanding of the instructions.   The patient was advised to call back or seek an in-person evaluation if the symptoms worsen or if the condition fails to improve as anticipated.  I provided 20 minutes of non-face-to-face time during this encounter.   Kathrynn Ducking, MD

## 2018-08-18 ENCOUNTER — Encounter: Payer: Self-pay | Admitting: Speech Pathology

## 2018-08-20 ENCOUNTER — Ambulatory Visit (INDEPENDENT_AMBULATORY_CARE_PROVIDER_SITE_OTHER): Payer: BC Managed Care – PPO | Admitting: Psychology

## 2018-08-20 DIAGNOSIS — F4310 Post-traumatic stress disorder, unspecified: Secondary | ICD-10-CM | POA: Diagnosis not present

## 2018-08-21 ENCOUNTER — Ambulatory Visit: Payer: BC Managed Care – PPO | Attending: Family Medicine | Admitting: Speech Pathology

## 2018-08-21 DIAGNOSIS — R4789 Other speech disturbances: Secondary | ICD-10-CM | POA: Diagnosis not present

## 2018-08-21 NOTE — Therapy (Signed)
[facility] [address] [place], [place], [address] Phone: [phone]   Fax:  [phone]  Speech Language Pathology Treatment  Patient Details  Name: Abigail Singh MRN: [id number] Date of Birth: 13-Mar-1976 Referring Provider (SLP): Annye Asa, MD   Speech Therapy Telehealth Visit:  I connected with Abigail Singh. [place] today at 11:00 by Webex video conference and verified that I am speaking with the correct person using two identifiers.  I discussed the limitations, risks, security and privacy concerns of performing an evaluation and management service by Webex and the availability of in person appointments.  I also discussed with the patient that there may be a patient responsible charge related to this service. The patient expressed understanding and agreed to proceed.    The patient's address was confirmed.  Identified to the patient that therapist is a licensed Copywriter, advertising in the state of [place].  Verified phone # as [phone] to call in case of technical difficulties.     Encounter Date: 08/21/2018  End of Session - 08/21/18 1453    Visit Number  2    Number of Visits  9    Date for SLP Re-Evaluation  09/12/18    Authorization Type  BCBS    SLP Start Time  29    SLP Stop Time   1148    SLP Time Calculation (min)  46 min    Activity Tolerance  Patient tolerated treatment well       Past Medical History:  Diagnosis Date  . Allergy    sseasonal  . Anemia    as teenager  . Anxiety   . Depression   . Fibroid   . Fibromyalgia   . Heartburn in pregnancy   . History of fibromyalgia   . Migraines   . Neuromuscular disorder (HCC)    fibromyalgia  . Postpartum care following cesarean delivery 03/17/2012  . Preterm labor   . Status post cesarean delivery (repeat x 2) 03/17/2012  . Urinary tract infection     Past Surgical History:  Procedure Laterality Date  . abnormal cells  in colon/tatoo area     2012  . CESAREAN SECTION    . CESAREAN SECTION  03/17/2012   Procedure: CESAREAN SECTION;  Surgeon: Marvene Staff, MD;  Location: [facility];  Service: Obstetrics;  Laterality: N/A;  Repeat C/S.  EDD: 03/23/12  . COLONOSCOPY    . DILATION AND CURETTAGE OF UTERUS    . POLYPECTOMY    . tubes in ears    . WISDOM TOOTH EXTRACTION      There were no vitals filed for this visit.  Subjective Assessment - 08/21/18 1110    Subjective  "I don't get as many people correcting me but I'm not around people as much."    Currently in Pain?  No/denies            ADULT SLP TREATMENT - 08/21/18 1102      General Information   Behavior/Cognition  Alert;Cooperative;Pleasant mood      Treatment Provided   Treatment provided  Cognitive-Linquistic      Pain Assessment   Pain Assessment  No/denies pain      Cognitive-Linquistic Treatment   Treatment focused on  Other (comment)   speech disturbance   Skilled Treatment  Pt reports she has not been able to practice oral resonance tasks instructed at time of evaluation, approximately 5 weeks ago. She reports she continues to receive feedback from her  children, husband, and sister that sometimes her words "sound funny" or her voice sounds "like it's in my throat." She endorses numbness of her top left lip "internally," and feels this is more pronounced when she is fatigued. SLP reviewed oral resonance exercises with pt. Pt perceived extra effort and difficulty "when there are 2 vowels" in a word (SLP noted these words contained only 1 vowel when transcribed phonetically, ex: ee --> /i/). She was able to achieve oral resonance in nasalized word level tasks ~85% accuracy, 95% with min verbal/demonstration cues. Pt had difficulty perceiving oral resonance vs glottal fry/throaty focus, so SLP had pt record video on her cell phone for feedback. When playing back her speech, pt perceived it as "normal." Progressed to nasalized phrases  of 3-4 words, pt with 90% accuracy; did not perceive glottal fry but able to ID on playback with mod cue. 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 SLP initiated training in abdominal breathing. Pt with chest-centered breathing. SLP provided handout and instructions for abdominal breathing in supine position; pt to complete 2x per day.      Assessment / Recommendations / Plan   Plan  Continue with current plan of care      Progression Toward Goals   Progression toward goals  Progressing toward goals       SLP Education - 08/21/18 1453    Education Details  oral resonance, abdominal breathing    Person(s) Educated  Patient    Methods  Explanation;Demonstration;Verbal cues    Comprehension  Verbalized understanding;Need further instruction;Returned demonstration       SLP Short Term Goals - 08/21/18 1457      SLP SHORT TERM GOAL #1   Title  Pt will use oral resonance for [facility] vocal quality in 18/20 sentences x 2 visits.    Time  4    Period  Weeks    Status  On-going      SLP SHORT TERM GOAL #2   Title  Pt will use abdominal breathing in 5 minutes simple conversation x2 visits.    Time  4    Period  Weeks    Status  On-going       SLP Long Term Goals - 08/21/18 1457      SLP LONG TERM GOAL #1   Title  Pt will report fewer communication breakdowns/requests to repeat herself x 2 sessions.    Time  8    Period  Weeks    Status  On-going      SLP LONG TERM GOAL #2   Title  Pt's voice related quality of life (V-RQOL) will improve to >/= 80 (>80 is WNL)    Time  8    Period  Weeks    Status  On-going      SLP LONG TERM GOAL #3   Title  Pt will report receiving fewer inquiries/negative comments about her speech.    Time  8    Period  Weeks    Status  On-going       Plan - 08/21/18 1457    Clinical Impression Statement  Patient continues to complain of speech disturbance. No dysarthria or vowel distortions noted  in today's session; some glottal fry noted. Pt reports others tell her her voice "sounds like it's in my throat." She states she isn't able to hear the changes in her speech unless she hears a recording, but can sometimes feel it "  like my tongue swollen or moving differently." Pt recorded herself in nasalized words/phrases and reported her speech sounded "normal" during these tasks. Pt reports decreased quality of life due to others' perceptions, feeling anxious, frustrated and embarrassed due to problems with her speech. I recommend skilled ST to train pt in techniques to adjust resonance and increase control/efficiency of respiration and phonation, in order to improve patient's voice and speech-related quality of life.      Speech Therapy Frequency  1x /week    Duration  --   8 weeks or 9 visits total   Treatment/Interventions  Cueing hierarchy;Functional tasks;SLP instruction and feedback;Compensatory strategies;Patient/family education    Potential to Achieve Goals  Good       Patient will benefit from skilled therapeutic intervention in order to improve the following deficits and impairments:   Other speech disturbance    Problem List Patient Active Problem List   Diagnosis Date Noted  . 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 Seasonal allergic conjunctivitis 06/24/2018  . Other allergic rhinitis 06/24/2018  . Other dysphagia 06/24/2018  . Visit for preventive health examination 12/05/2017  . Fibromyalgia 12/05/2017  . Vitamin D deficiency 11/26/2016  . Insomnia 11/26/2016  . ADHD 11/26/2016  . Obesity (BMI 30.0-34.9) 01/27/2016  . Migraine 12/29/2014  . Plantar fasciitis, bilateral 11/01/2014  . Weight gain 11/01/2014  . Abdominal cramping 02/05/2014  . Sialoadenitis 10/21/2013  . Physical exam 10/23/2010  . Dysuria 10/13/2010  . FIBROMYALGIA 11/09/2009  . Thyroid nodule 10/21/2009   Deneise Lever, [place], CCC-SLP Speech-Language Pathologist   Aliene Altes 08/21/2018, 2:58 PM  [facility] [address] [place], [place], [address] Phone: [phone]   Fax:  [phone]   Name: Abigail Singh MRN: [id number] Date of Birth: 02-11-1976

## 2018-08-21 NOTE — Patient Instructions (Signed)
Abdominal Breathing exercises 15 minutes, twice a day  Lay on your back with a plastic cup on your belly and repeat the above steps, watching you belly move up with inhalation and down with exhalations  . Shoulders down - this is a cue to relax . Place your hand on your abdomen - this helps you focus on easy abdominal breath support - the best and most relaxed way to breathe . Breathe in through your nose and fill your belly with air, watching your hand move outward . Breathe out through your mouth and watch your belly move in. An audible "sh"  may help   Think of your belly as a balloon, when you fill with air (inhale), the balloon gets bigger. As the air goes out (exhale), the balloon deflates.    Practice breathing in and out in front of a mirror, watching your belly Breathe in for a count of 5 and breathe out for a count of 5    Afterwards, practice your Exercises to Improve Voice Placement: record yourself on your phone to help build your awareness of when you have the "throaty" vs orally resonant voice  Add the additional phrases after you have done the word tasks. Continue to focus your attention on the humming sensation in the "mask." If you feel that your pitch is changing or you notice the "throaty" quality, try saying "m-hm" again before saying the phrase.   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

## 2018-08-22 ENCOUNTER — Ambulatory Visit: Payer: BC Managed Care – PPO | Admitting: Neurology

## 2018-08-25 ENCOUNTER — Ambulatory Visit (INDEPENDENT_AMBULATORY_CARE_PROVIDER_SITE_OTHER): Payer: BC Managed Care – PPO | Admitting: Psychology

## 2018-08-25 DIAGNOSIS — F4310 Post-traumatic stress disorder, unspecified: Secondary | ICD-10-CM | POA: Diagnosis not present

## 2018-08-27 ENCOUNTER — Ambulatory Visit (INDEPENDENT_AMBULATORY_CARE_PROVIDER_SITE_OTHER): Payer: BC Managed Care – PPO | Admitting: Psychology

## 2018-08-27 DIAGNOSIS — F4310 Post-traumatic stress disorder, unspecified: Secondary | ICD-10-CM

## 2018-09-03 ENCOUNTER — Ambulatory Visit (INDEPENDENT_AMBULATORY_CARE_PROVIDER_SITE_OTHER): Payer: BC Managed Care – PPO | Admitting: Psychology

## 2018-09-03 DIAGNOSIS — F4310 Post-traumatic stress disorder, unspecified: Secondary | ICD-10-CM | POA: Diagnosis not present

## 2018-09-04 ENCOUNTER — Other Ambulatory Visit: Payer: Self-pay

## 2018-09-04 ENCOUNTER — Ambulatory Visit: Payer: BC Managed Care – PPO | Admitting: Speech Pathology

## 2018-09-04 DIAGNOSIS — R4789 Other speech disturbances: Secondary | ICD-10-CM | POA: Diagnosis not present

## 2018-09-08 ENCOUNTER — Ambulatory Visit (INDEPENDENT_AMBULATORY_CARE_PROVIDER_SITE_OTHER): Payer: BC Managed Care – PPO | Admitting: Psychology

## 2018-09-08 DIAGNOSIS — F4310 Post-traumatic stress disorder, unspecified: Secondary | ICD-10-CM

## 2018-09-09 ENCOUNTER — Encounter: Payer: Self-pay | Admitting: Family Medicine

## 2018-09-09 DIAGNOSIS — F900 Attention-deficit hyperactivity disorder, predominantly inattentive type: Secondary | ICD-10-CM

## 2018-09-10 ENCOUNTER — Ambulatory Visit (INDEPENDENT_AMBULATORY_CARE_PROVIDER_SITE_OTHER): Payer: BC Managed Care – PPO | Admitting: Psychology

## 2018-09-10 DIAGNOSIS — F4310 Post-traumatic stress disorder, unspecified: Secondary | ICD-10-CM | POA: Diagnosis not present

## 2018-09-10 MED ORDER — AMPHETAMINE-DEXTROAMPHET ER 20 MG PO CP24: 20.0000 mg | ORAL_CAPSULE | ORAL | 0 refills | Status: DC

## 2018-09-10 NOTE — Telephone Encounter (Signed)
Last OV 06/09/18 adderall last filled 08/12/18 #30 with 0

## 2018-09-11 ENCOUNTER — Ambulatory Visit: Payer: BC Managed Care – PPO | Attending: Family Medicine | Admitting: Speech Pathology

## 2018-09-11 ENCOUNTER — Other Ambulatory Visit: Payer: Self-pay

## 2018-09-11 DIAGNOSIS — R4789 Other speech disturbances: Secondary | ICD-10-CM

## 2018-09-15 NOTE — Therapy (Signed)
[facility] [address] [place], [place], [address] Phone: [phone]   Fax:  [phone]  Speech Language Pathology Treatment  Patient Details  Name: Abigail Singh MRN: [id number] Date of Birth: Apr 05, 1976 Referring Provider (SLP): Annye Asa, MD  Speech Therapy Telehealth Visit:  I connected with Donalynn Furlong. Roberg today at 11:00 by Webex video conference and verified that I am speaking with the correct person using two identifiers.  I discussed the limitations, risks, security and privacy concerns of performing an evaluation and management service by Webex and the availability of in person appointments.  I also discussed with the patient that there may be a patient responsible charge related to this service. The patient expressed understanding and agreed to proceed.   The patient's address was confirmed.  Identified to the patient that therapist is a licensed Copywriter, advertising in the state of [place].  Verified phone # as [phone] to call in case of technical difficulties. Encounter Date: 09/04/2018  End of Session - 09/04/18 1103   Visit Number  3    Number of Visits  9    Date for SLP Re-Evaluation  09/12/18    Authorization Type  BCBS    SLP Start Time  1103    SLP Stop Time   1150    SLP Time Calculation (min)  47 min    Activity Tolerance  Patient tolerated treatment well        09/04/18 1103  Symptoms/Limitations  Subjective "Honestly I haven't been able to do the breathing and the speech every day."  Pain Assessment  Currently in Pain? No/denies     Past Medical History:  Diagnosis Date  . Allergy    sseasonal  . Anemia    as teenager  . Anxiety   . Depression   . Fibroid   . Fibromyalgia   . Heartburn in pregnancy   . History of fibromyalgia   . Migraines   . Neuromuscular disorder (HCC)    fibromyalgia  . Postpartum care following cesarean delivery 03/17/2012  .  Preterm labor   . Status post cesarean delivery (repeat x 2) 03/17/2012  . Urinary tract infection     Past Surgical History:  Procedure Laterality Date  . abnormal cells in colon/tatoo area     2012  . CESAREAN SECTION    . CESAREAN SECTION  03/17/2012   Procedure: CESAREAN SECTION;  Surgeon: Marvene Staff, MD;  Location: [facility];  Service: Obstetrics;  Laterality: N/A;  Repeat C/S.  EDD: 03/23/12  . COLONOSCOPY    . DILATION AND CURETTAGE OF UTERUS    . POLYPECTOMY    . tubes in ears    . WISDOM TOOTH EXTRACTION      There were no vitals filed for this visit.     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 She did not record her speech episodes or practice as instructed. Pt reports her children corrected her more frequently when she was reading aloud. SLP told pt to record herself this week when reading aloud to her children. In abdominal breathing at rest, pt accuracy was <60% accurate with modeling and verbal cues. As unable to provide tactile cues  via telehealth, SLP suggested pt [employer] app which gives biofeedback for AB. Oral resonance in word and phrase level tasks ~90% accuracy, no significant glottal fry noted today.   Assessment / Recommendations / Plan  Plan Continue with current plan of care  Progression Toward Goals  Progression toward goals Not progressing toward goals (comment) (inconsistent practice at home, rpts dcr awareness of speech)         09/04/18 1103  SLP Assessment/Plan  Clinical Impression Statement Patient continues to complain of speech disturbance.  No dysarthria or vowel distortions noted in today's session; minimal glottal fry noted. Pt reports others tell her her voice "sounds like it's in my throat." She states she isn't able to hear the changes in her speech unless she hears a recording, but can sometimes feel it "like my tongue swollen or moving differently." Pt reports decreased quality of life due to others' perceptions, feeling anxious, frustrated and embarrassed due to problems with her speech. As pt is having difficulty finding time to practice at home, will consider pausing therapy if pt unable to do so this week. I recommend skilled ST to train pt in techniques to adjust resonance and increase control/efficiency of respiration and phonation, in order to improve patient's voice and speech-related quality of life.    Speech Therapy Frequency 1x /week  Duration  (8 weeks or 9 visits)  Treatment/Interventions Cueing hierarchy;Functional tasks;SLP instruction and feedback;Compensatory strategies;Patient/family education  Potential to Achieve Goals Fair  Potential Considerations Cooperation/participation level  Consulted and Agree with Plan of Care Patient           SLP Short Term Goals - 09/04/18 1103     SLP SHORT TERM GOAL #1   Title  Pt will use oral resonance for [facility] vocal quality in 18/20 sentences x 2 visits.    Time  3    Period  Weeks    Status  On-going      SLP SHORT TERM GOAL #2   Title  Pt will use abdominal breathing in 5 minutes simple conversation x2 visits.    Time  3    Period  Weeks    Status  On-going       SLP Long Term Goals -09/04/18 1103     SLP LONG TERM GOAL #1   Title  Pt will report fewer communication breakdowns/requests to repeat herself x 2 sessions.    Time  7    Period  Weeks    Status  On-going      SLP LONG TERM GOAL #2   Title  Pt's voice related quality of life (V-RQOL) will improve to >/= 80 (>80 is WNL)    Time  7    Period  Weeks    Status  On-going      SLP LONG TERM  GOAL #3   Title  Pt will report receiving fewer inquiries/negative comments about her speech.    Time  7    Period  Weeks    Status  On-going       Plan - 09/04/18 1103   Clinical Impression Statement  Patient continues to complain of speech disturbance. No dysarthria or vowel distortions noted in today's session; minimal glottal fry noted. Pt reports others tell her her voice "sounds like it's in my throat." She states she isn't able to hear the changes in her speech unless she hears a recording, but can sometimes feel it "like my tongue swollen or moving differently." Pt reports decreased  quality of life due to others' perceptions, feeling anxious, frustrated and embarrassed due to problems with her speech. As pt is having difficulty finding time to practice at home, will consider pausing therapy if pt unable to do so this week. I recommend skilled ST to train pt in techniques to adjust resonance and increase control/efficiency of respiration and phonation, in order to improve patient's voice and speech-related quality of life.      Speech Therapy Frequency  1x /week    Duration  --   8 weeks or 9 visits   Treatment/Interventions  Cueing hierarchy;Functional tasks;SLP instruction and feedback;Compensatory strategies;Patient/family education    Potential to Achieve Goals  Fair    Potential Considerations  Cooperation/participation level    Consulted and Agree with Plan of Care  Patient       Patient will benefit from skilled therapeutic intervention in order to improve the following deficits and impairments:   Other speech disturbance    Problem List Patient Active Problem List   Diagnosis Date Noted  . 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 Seasonal allergic conjunctivitis 06/24/2018  . Other allergic rhinitis 06/24/2018  . Other dysphagia 06/24/2018  . Visit for preventive health examination 12/05/2017  . Fibromyalgia 12/05/2017  . Vitamin D deficiency  11/26/2016  . Insomnia 11/26/2016  . ADHD 11/26/2016  . Obesity (BMI 30.0-34.9) 01/27/2016  . Migraine 12/29/2014  . Plantar fasciitis, bilateral 11/01/2014  . Weight gain 11/01/2014  . Abdominal cramping 02/05/2014  . Sialoadenitis 10/21/2013  . Physical exam 10/23/2010  . Dysuria 10/13/2010  . FIBROMYALGIA 11/09/2009  . Thyroid nodule 10/21/2009   Deneise Lever, [place], CCC-SLP Speech-Language Pathologist  Aliene Altes 09/15/2018, 7:15 AM  [facility] [address] [place], [place], [address] Phone: [phone]   Fax:  [phone]   Name: ANNISSA ANDREONI MRN: [id number] Date of Birth: April 15, 1976

## 2018-09-15 NOTE — Therapy (Signed)
[facility] [address] [place], [place], [address] Phone: [phone]   Fax:  [phone]  Speech Language Pathology Treatment  Patient Details  Name: Abigail Singh MRN: [id number] Date of Birth: 1976-02-15 Referring Provider (SLP): Annye Asa, MD   Encounter Date: 09/11/2018  End of Session - 09/11/18 1453    Visit Number  4    Number of Visits  9    Date for SLP Re-Evaluation  09/12/18    Authorization Type  BCBS    SLP Start Time  6962    SLP Stop Time   1520    SLP Time Calculation (min)  27 min    Activity Tolerance  Patient tolerated treatment well       Past Medical History:  Diagnosis Date  . Allergy    sseasonal  . Anemia    as teenager  . Anxiety   . Depression   . Fibroid   . Fibromyalgia   . Heartburn in pregnancy   . History of fibromyalgia   . Migraines   . Neuromuscular disorder (HCC)    fibromyalgia  . Postpartum care following cesarean delivery 03/17/2012  . Preterm labor   . Status post cesarean delivery (repeat x 2) 03/17/2012  . Urinary tract infection     Past Surgical History:  Procedure Laterality Date  . abnormal cells in colon/tatoo area     2012  . CESAREAN SECTION    . CESAREAN SECTION  03/17/2012   Procedure: CESAREAN SECTION;  Surgeon: Marvene Staff, MD;  Location: [facility];  Service: Obstetrics;  Laterality: N/A;  Repeat C/S.  EDD: 03/23/12  . COLONOSCOPY    . DILATION AND CURETTAGE OF UTERUS    . POLYPECTOMY    . tubes in ears    . WISDOM TOOTH EXTRACTION      There were no vitals filed for this visit.  Subjective Assessment - 09/11/18 1453    Subjective  "My husband told me I talk differently with you on the computer. As soon as I hang up it all goes down here (gestures to throat)"    Currently in Pain?  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 Pt has not recorded herself this week. SLP asked pt if she would like to pause therapy and resume when she has more time to dedicate to home practice and building awareness of her speech issues. Pt agreed she would be able to "focus without any distractions," once the semester has ended, in approximately 4 weeks. Pt continues to report feedback re: "talking differently," from her husband and children, but states she is only aware of her speech disturbance when she receives this feedback. Reviewed abdominal breathing and oral resonance techniques. Pt accuracy ~60% with AB. SLP instructed pt to record her speech during the next month, as well as to work on abdominal breathing with biofeedback in an attempt to  build awareness.      Assessment / Recommendations / Plan   Plan  Continue with current plan of care      Progression Toward Goals   Progression toward goals  Not progressing toward goals (comment)   will hold therapy for 4 weeks      SLP Education - 09/11/18 1453    Education Details  abdominal breathing, oral resonance, use of biofeedback to build awareness    Person(s) Educated  Patient    Methods  Explanation;Verbal cues    Comprehension  Verbalized understanding;Need further instruction       SLP Short Term Goals - 09/11/18 1453      SLP SHORT TERM GOAL #1   Title  Pt will use oral resonance for [facility] vocal quality in 18/20 sentences x 2 visits.    Time  2    Period  Weeks    Status  On-going      SLP SHORT TERM GOAL #2   Title  Pt will use abdominal breathing in 5 minutes simple  conversation x2 visits.    Time  2    Period  Weeks    Status  On-going       SLP Long Term Goals -  09/11/18 1453     SLP LONG TERM GOAL #1   Title  Pt will report fewer communication breakdowns/requests to repeat herself x 2 sessions.    Time  6    Period  Weeks    Status  On-going      SLP LONG TERM GOAL #2   Title  Pt's voice related quality of life (V-RQOL) will improve to >/= 80 (>80 is WNL)    Time  6    Period  Weeks    Status  On-going      SLP LONG TERM GOAL #3   Title  Pt will report receiving fewer inquiries/negative comments about her speech.    Time  6    Period  Weeks    Status  On-going       Plan - 09/11/18 1453    Clinical Impression Statement  Patient continues to complain of speech disturbance. No dysarthria or vowel distortions noted in today's session; minimal glottal fry noted. Pt reports others tell her her voice "sounds like it's in my throat." She states she isn't able to hear the changes in her speech unless she hears a recording, but can sometimes feel it "like my tongue swollen or moving differently." Pt reports decreased quality of life due to others' perceptions, feeling anxious, frustrated and embarrassed due to problems with her speech. Pt in agreement to hold therapy for 4 weeks as she has been unable to practice due to time constraints with homeschooling and online teaching. I recommend skilled ST to train pt in techniques to adjust resonance and increase control/efficiency of respiration and phonation, in order to improve patient's voice and speech-related quality of life.      Speech Therapy Frequency  --   hold therapy for 4 weeks   Duration  --   hold therapy for 4 weeks   Treatment/Interventions  Cueing hierarchy;Functional tasks;SLP instruction and feedback;Compensatory strategies;Patient/family education    Potential to Achieve Goals  Fair    Potential Considerations  Cooperation/participation level   awareness   Consulted and Agree  with Plan of Care  Patient       Patient will benefit from skilled therapeutic intervention in order to improve the following deficits and impairments:  Other speech disturbance    Problem List Patient Active Problem List   Diagnosis Date Noted  . 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 Seasonal allergic conjunctivitis 06/24/2018  . Other allergic rhinitis 06/24/2018  . Other dysphagia 06/24/2018  . Visit for preventive health examination 12/05/2017  . Fibromyalgia 12/05/2017  . Vitamin D deficiency 11/26/2016  . Insomnia 11/26/2016  . ADHD 11/26/2016  . Obesity (BMI 30.0-34.9) 01/27/2016  . Migraine 12/29/2014  . Plantar fasciitis, bilateral 11/01/2014  . Weight gain 11/01/2014  . Abdominal cramping 02/05/2014  . Sialoadenitis 10/21/2013  . Physical exam 10/23/2010  . Dysuria 10/13/2010  . FIBROMYALGIA 11/09/2009  . Thyroid nodule 10/21/2009   Deneise Lever, [place], [place] 09/15/2018, 9:30 AM  [facility] [address] [place], [place], [address] Phone: [phone]   Fax:  [phone]   Name: KAFI DOTTER MRN: [id number] Date of Birth: [birth year]

## 2018-09-17 ENCOUNTER — Ambulatory Visit (INDEPENDENT_AMBULATORY_CARE_PROVIDER_SITE_OTHER): Payer: BC Managed Care – PPO | Admitting: Psychology

## 2018-09-17 DIAGNOSIS — F4310 Post-traumatic stress disorder, unspecified: Secondary | ICD-10-CM

## 2018-09-22 ENCOUNTER — Ambulatory Visit (INDEPENDENT_AMBULATORY_CARE_PROVIDER_SITE_OTHER): Payer: BC Managed Care – PPO | Admitting: Psychology

## 2018-09-22 DIAGNOSIS — F4310 Post-traumatic stress disorder, unspecified: Secondary | ICD-10-CM | POA: Diagnosis not present

## 2018-09-24 ENCOUNTER — Ambulatory Visit (INDEPENDENT_AMBULATORY_CARE_PROVIDER_SITE_OTHER): Payer: BC Managed Care – PPO | Admitting: Psychology

## 2018-09-24 DIAGNOSIS — F4310 Post-traumatic stress disorder, unspecified: Secondary | ICD-10-CM

## 2018-10-01 ENCOUNTER — Ambulatory Visit (INDEPENDENT_AMBULATORY_CARE_PROVIDER_SITE_OTHER): Payer: BC Managed Care – PPO | Admitting: Psychology

## 2018-10-01 DIAGNOSIS — F4310 Post-traumatic stress disorder, unspecified: Secondary | ICD-10-CM | POA: Diagnosis not present

## 2018-10-06 ENCOUNTER — Ambulatory Visit (INDEPENDENT_AMBULATORY_CARE_PROVIDER_SITE_OTHER): Payer: BC Managed Care – PPO | Admitting: Psychology

## 2018-10-06 DIAGNOSIS — F4310 Post-traumatic stress disorder, unspecified: Secondary | ICD-10-CM | POA: Diagnosis not present

## 2018-10-07 ENCOUNTER — Encounter: Payer: Self-pay | Admitting: Family Medicine

## 2018-10-07 DIAGNOSIS — F900 Attention-deficit hyperactivity disorder, predominantly inattentive type: Secondary | ICD-10-CM

## 2018-10-07 MED ORDER — AMPHETAMINE-DEXTROAMPHET ER 20 MG PO CP24: 20.0000 mg | ORAL_CAPSULE | ORAL | 0 refills | Status: DC

## 2018-10-07 NOTE — Telephone Encounter (Signed)
Last OV 06/09/18 adderall last filled 09/10/18 #30 with 0

## 2018-10-08 ENCOUNTER — Ambulatory Visit (INDEPENDENT_AMBULATORY_CARE_PROVIDER_SITE_OTHER): Payer: BC Managed Care – PPO | Admitting: Psychology

## 2018-10-08 DIAGNOSIS — F4310 Post-traumatic stress disorder, unspecified: Secondary | ICD-10-CM | POA: Diagnosis not present

## 2018-10-12 ENCOUNTER — Other Ambulatory Visit: Payer: Self-pay | Admitting: Family Medicine

## 2018-10-13 NOTE — Telephone Encounter (Signed)
Last oV 06/09/18 belsomra last filled 04/07/18 #30 with 1

## 2018-10-15 ENCOUNTER — Ambulatory Visit (INDEPENDENT_AMBULATORY_CARE_PROVIDER_SITE_OTHER): Payer: BC Managed Care – PPO | Admitting: Psychology

## 2018-10-15 DIAGNOSIS — F4310 Post-traumatic stress disorder, unspecified: Secondary | ICD-10-CM | POA: Diagnosis not present

## 2018-10-17 ENCOUNTER — Ambulatory Visit (INDEPENDENT_AMBULATORY_CARE_PROVIDER_SITE_OTHER): Payer: BC Managed Care – PPO | Admitting: Physician Assistant

## 2018-10-17 ENCOUNTER — Other Ambulatory Visit: Payer: Self-pay

## 2018-10-17 ENCOUNTER — Encounter: Payer: Self-pay | Admitting: Physician Assistant

## 2018-10-17 VITALS — BP 108/68 | Resp 16 | Ht 64.0 in | Wt 162.0 lb

## 2018-10-17 DIAGNOSIS — H1189 Other specified disorders of conjunctiva: Secondary | ICD-10-CM

## 2018-10-17 NOTE — Patient Instructions (Signed)
Instructions sent to MyChart.   Avoid touching those eyes! Keep the contacts out and stick with the glasses until things resolve. Apply a warm compress to the eye for 10-15 minutes a couple of times per day. Can use benadryl at night. If you note symptoms are not resolving over the next 48 hours, or if you note anything new or different, please send me a MyChart message.

## 2018-10-17 NOTE — Progress Notes (Signed)
Virtual Visit via Video   I connected with patient on 10/17/18 at  1:00 PM EDT by a video enabled telemedicine application and verified that I am speaking with the correct person using two identifiers.  Location patient: Home Location provider: Acupuncturist, Office Persons participating in the virtual visit: Patient, Provider, Wilcox (Patina moore)  I discussed the limitations of evaluation and management by telemedicine and the availability of in person appointments. The patient expressed understanding and agreed to proceed.  Subjective:   HPI:   Patient presents today via Doxy endorsing irritation and watering in her left eye last night. Notes lower eyelid was a bit swollen. Notes this has resolved since this morning. She has had some mild watering early but not presently. Denies vision changes, eye pain or photophobia. Does wear contacts but takes them out nightly. Has not work contacts since symptom onset yesterday. Denies recent travel or sick contact.  ROS:   See pertinent positives and negatives per HPI.  Patient Active Problem List   Diagnosis Date Noted  . 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 Seasonal allergic conjunctivitis 06/24/2018  . Other allergic rhinitis 06/24/2018  . Other dysphagia 06/24/2018  . Visit for preventive health examination 12/05/2017  . Fibromyalgia 12/05/2017  . Vitamin D deficiency 11/26/2016  . Insomnia 11/26/2016  . ADHD 11/26/2016  . Obesity (BMI 30.0-34.9) 01/27/2016  . Migraine 12/29/2014  . Plantar fasciitis, bilateral 11/01/2014  . Weight gain 11/01/2014  . Abdominal cramping 02/05/2014  . Sialoadenitis 10/21/2013  . Physical exam 10/23/2010  . Dysuria 10/13/2010  . FIBROMYALGIA 11/09/2009  . Thyroid nodule 10/21/2009    Social History   Tobacco Use  . Smoking status: Former Smoker    Types: Cigarettes    Quit date: 05/07/1997    Years since quitting: 21.4  . Smokeless tobacco: Never Used  .  Tobacco comment: no E cigs  Substance Use Topics  . Alcohol use: No    Alcohol/week: 0.0 standard drinks    Current Outpatient Medications:  .  acetaminophen (TYLENOL) 325 MG tablet, Take 650 mg by mouth every 6 (six) hours as needed., Disp: , Rfl:  .  amphetamine-dextroamphetamine (ADDERALL XR) 20 MG 24 hr capsule, Take 1 capsule (20 mg total) by mouth every morning., Disp: 30 capsule, Rfl: 0 .  BELSOMRA 10 MG TABS, TAKE 1 TABLET BY MOUTH AT BEDTIME, Disp: 30 tablet, Rfl: 3 .  Calcium Carbonate-Vit D-Min (CALCIUM 1200 PO), Take 1 capsule by mouth daily., Disp: , Rfl:  .  EPINEPHrine 0.3 mg/0.3 mL IJ SOAJ injection, Inject 0.3 mLs (0.3 mg total) into the muscle as needed for anaphylaxis., Disp: 1 Device, Rfl: 1 .  fluticasone (FLONASE) 50 MCG/ACT nasal spray, 2 sprays per nostril once a day if needed for stuffy nose., Disp: 16 g, Rfl: 5 .  MULTIPLE VITAMIN PO, Take 1 capsule by mouth daily., Disp: , Rfl:  .  Olopatadine HCl (PAZEO) 0.7 % SOLN, Place 1 drop into both eyes as needed (at least 10 minutes before putting in the contact lenses if needed for itchy eyes)., Disp: 1 Bottle, Rfl: 5 .  vitamin B-12 (CYANOCOBALAMIN) 500 MCG tablet, Take 500 mcg by mouth daily., Disp: , Rfl:   Allergies  Allergen Reactions  . Aspirin Anaphylaxis    Pt reports being able to take ibuprofen with no problems.  . Codeine Other (See Comments)    halucinations  . Eggs Or Egg-Derived Products Other (See Comments)    Patient states  ok with flu shots, etc. No anaphalaxis.  Eggs give stomach cramps with diarrhea. After diarrhea ok.-pt eats baked goods that contains eggs with no issues   . Erythromycin Rash  . Lactose Intolerance (Gi) Diarrhea    Diarrhea with lactose.  Marland Kitchen Penicillins Rash  . Sulfonamide Derivatives Rash    Childhood reaction, has taken recently with only stomach irritation.    Objective:   There were no vitals taken for this visit.  Patient is well-developed, well-nourished in no acute  distress.  Resting comfortably in chair at home.  Head is normocephalic, atraumatic.  No labored breathing. Speech is clear and coherent with logical contest.  Patient is alert and oriented at baseline.  Conjunctiva without noted erythema, tearing or discharge from eye. No lid swelling noted. EOMI.   Assessment and Plan:   1. Conjunctival irritation Symptoms improved today compared to last night. No evidence of conjunctivitis on examination today. Supportive measures and home care reviewed with patient. She is to refrain from contact use until things completely resolve. Message or follow-up in office if symptoms are not continuing to resolve.     Leeanne Rio, PA-C 10/17/2018

## 2018-10-17 NOTE — Progress Notes (Signed)
I have discussed the procedure for the virtual visit with the patient who has given consent to proceed with assessment and treatment.   Renan Danese S Shawndra Clute, CMA     

## 2018-10-20 ENCOUNTER — Ambulatory Visit (INDEPENDENT_AMBULATORY_CARE_PROVIDER_SITE_OTHER): Payer: BC Managed Care – PPO | Admitting: Psychology

## 2018-10-20 DIAGNOSIS — F4310 Post-traumatic stress disorder, unspecified: Secondary | ICD-10-CM

## 2018-10-22 ENCOUNTER — Ambulatory Visit: Payer: BC Managed Care – PPO | Admitting: Psychology

## 2018-10-29 ENCOUNTER — Ambulatory Visit (INDEPENDENT_AMBULATORY_CARE_PROVIDER_SITE_OTHER): Payer: BC Managed Care – PPO | Admitting: Psychology

## 2018-10-29 DIAGNOSIS — F4310 Post-traumatic stress disorder, unspecified: Secondary | ICD-10-CM | POA: Diagnosis not present

## 2018-11-03 ENCOUNTER — Ambulatory Visit (INDEPENDENT_AMBULATORY_CARE_PROVIDER_SITE_OTHER): Payer: BC Managed Care – PPO | Admitting: Psychology

## 2018-11-03 DIAGNOSIS — F4310 Post-traumatic stress disorder, unspecified: Secondary | ICD-10-CM | POA: Diagnosis not present

## 2018-11-04 ENCOUNTER — Ambulatory Visit (INDEPENDENT_AMBULATORY_CARE_PROVIDER_SITE_OTHER): Payer: BC Managed Care – PPO | Admitting: Psychology

## 2018-11-04 DIAGNOSIS — F4310 Post-traumatic stress disorder, unspecified: Secondary | ICD-10-CM | POA: Diagnosis not present

## 2018-11-05 ENCOUNTER — Ambulatory Visit: Payer: BC Managed Care – PPO | Admitting: Psychology

## 2018-11-10 ENCOUNTER — Encounter: Payer: Self-pay | Admitting: Family Medicine

## 2018-11-10 DIAGNOSIS — F900 Attention-deficit hyperactivity disorder, predominantly inattentive type: Secondary | ICD-10-CM

## 2018-11-10 MED ORDER — AMPHETAMINE-DEXTROAMPHET ER 20 MG PO CP24: 20.0000 mg | ORAL_CAPSULE | ORAL | 0 refills | Status: DC

## 2018-11-10 NOTE — Telephone Encounter (Signed)
Last OV 10/17/18 adderall last filled 10/07/18 #30 with 0

## 2018-11-12 ENCOUNTER — Ambulatory Visit (INDEPENDENT_AMBULATORY_CARE_PROVIDER_SITE_OTHER): Payer: BC Managed Care – PPO | Admitting: Psychology

## 2018-11-12 DIAGNOSIS — F4310 Post-traumatic stress disorder, unspecified: Secondary | ICD-10-CM | POA: Diagnosis not present

## 2018-11-13 ENCOUNTER — Encounter: Payer: Self-pay | Admitting: Family Medicine

## 2018-11-17 ENCOUNTER — Ambulatory Visit (INDEPENDENT_AMBULATORY_CARE_PROVIDER_SITE_OTHER): Payer: BC Managed Care – PPO | Admitting: Psychology

## 2018-11-17 DIAGNOSIS — F4310 Post-traumatic stress disorder, unspecified: Secondary | ICD-10-CM | POA: Diagnosis not present

## 2018-11-19 ENCOUNTER — Ambulatory Visit (INDEPENDENT_AMBULATORY_CARE_PROVIDER_SITE_OTHER): Payer: BC Managed Care – PPO | Admitting: Psychology

## 2018-11-19 DIAGNOSIS — F4310 Post-traumatic stress disorder, unspecified: Secondary | ICD-10-CM

## 2018-11-20 ENCOUNTER — Encounter: Payer: Self-pay | Admitting: Family Medicine

## 2018-11-20 NOTE — Telephone Encounter (Signed)
I will decline the medication as there are refills remaining on the previous prescription.  In regards to testing, they may have had COVID but the antibody tests are not reliable and [facility] is not currently offering them

## 2018-11-20 NOTE — Telephone Encounter (Signed)
Last OV 10/17/18 Belsomra last filled 10/13/18 #30 with 3

## 2018-11-26 ENCOUNTER — Ambulatory Visit (INDEPENDENT_AMBULATORY_CARE_PROVIDER_SITE_OTHER): Payer: BC Managed Care – PPO | Admitting: Psychology

## 2018-11-26 DIAGNOSIS — F4310 Post-traumatic stress disorder, unspecified: Secondary | ICD-10-CM

## 2018-12-01 ENCOUNTER — Ambulatory Visit: Payer: BC Managed Care – PPO | Admitting: Psychology

## 2018-12-03 ENCOUNTER — Ambulatory Visit (INDEPENDENT_AMBULATORY_CARE_PROVIDER_SITE_OTHER): Payer: BC Managed Care – PPO | Admitting: Psychology

## 2018-12-03 DIAGNOSIS — F4310 Post-traumatic stress disorder, unspecified: Secondary | ICD-10-CM | POA: Diagnosis not present

## 2018-12-10 ENCOUNTER — Encounter: Payer: Self-pay | Admitting: Family Medicine

## 2018-12-10 ENCOUNTER — Ambulatory Visit: Payer: BC Managed Care – PPO | Admitting: Psychology

## 2018-12-10 DIAGNOSIS — F900 Attention-deficit hyperactivity disorder, predominantly inattentive type: Secondary | ICD-10-CM

## 2018-12-10 MED ORDER — AMPHETAMINE-DEXTROAMPHET ER 20 MG PO CP24: 20.0000 mg | ORAL_CAPSULE | ORAL | 0 refills | Status: DC

## 2018-12-15 ENCOUNTER — Ambulatory Visit: Payer: BC Managed Care – PPO | Admitting: Psychology

## 2018-12-17 ENCOUNTER — Ambulatory Visit (INDEPENDENT_AMBULATORY_CARE_PROVIDER_SITE_OTHER): Payer: BC Managed Care – PPO | Admitting: Psychology

## 2018-12-17 DIAGNOSIS — F4310 Post-traumatic stress disorder, unspecified: Secondary | ICD-10-CM | POA: Diagnosis not present

## 2018-12-24 ENCOUNTER — Ambulatory Visit (INDEPENDENT_AMBULATORY_CARE_PROVIDER_SITE_OTHER): Payer: BC Managed Care – PPO | Admitting: Psychology

## 2018-12-24 DIAGNOSIS — F4310 Post-traumatic stress disorder, unspecified: Secondary | ICD-10-CM

## 2018-12-29 ENCOUNTER — Ambulatory Visit: Payer: BC Managed Care – PPO | Admitting: Psychology

## 2018-12-31 ENCOUNTER — Ambulatory Visit (INDEPENDENT_AMBULATORY_CARE_PROVIDER_SITE_OTHER): Payer: BC Managed Care – PPO | Admitting: Psychology

## 2018-12-31 DIAGNOSIS — F4310 Post-traumatic stress disorder, unspecified: Secondary | ICD-10-CM

## 2019-01-01 ENCOUNTER — Encounter: Payer: Self-pay | Admitting: Family Medicine

## 2019-01-01 ENCOUNTER — Ambulatory Visit (INDEPENDENT_AMBULATORY_CARE_PROVIDER_SITE_OTHER): Payer: BC Managed Care – PPO | Admitting: Family Medicine

## 2019-01-01 ENCOUNTER — Other Ambulatory Visit: Payer: Self-pay

## 2019-01-01 VITALS — BP 123/88 | HR 110 | Ht 63.5 in | Wt 160.0 lb

## 2019-01-01 DIAGNOSIS — R635 Abnormal weight gain: Secondary | ICD-10-CM | POA: Diagnosis not present

## 2019-01-01 DIAGNOSIS — F900 Attention-deficit hyperactivity disorder, predominantly inattentive type: Secondary | ICD-10-CM | POA: Diagnosis not present

## 2019-01-01 MED ORDER — AMPHETAMINE-DEXTROAMPHET ER 20 MG PO CP24: 20.0000 mg | ORAL_CAPSULE | ORAL | 0 refills | Status: DC

## 2019-01-01 NOTE — Progress Notes (Signed)
I have discussed the procedure for the virtual visit with the patient who has given consent to proceed with assessment and treatment.   Jessica L Brodmerkel, CMA     

## 2019-01-01 NOTE — Progress Notes (Signed)
Virtual Visit via Video   I connected with patient on 01/01/19 at  1:00 PM EDT by a video enabled telemedicine application and verified that I am speaking with the correct person using two identifiers.  Location patient: Home Location provider: Acup[facility]urist, Office Persons participating in the virtual visit: Patient, Provider, [place] (Jess B)  I discussed the limitations of evaluation and management by telemedicine and the availability of in person appointments. The patient expressed understanding and agreed to proceed.  Subjective:   HPI:   ADHD- ongoing issue for pt.  On Adderall XR 20mg  daily.  Focus is good.  Able to sleep without difficulty.  Able to get work done.  Weight gain- pt feels she is gaining weight.  Admits to drinking 48 oz of soda daily- which she had quit prior to the stress of the pandemic.  Is trying to be active w/ kids but admits this is hit and miss.  ROS:   See pertinent positives and negatives per HPI.  Patient Active Problem List   Diagnosis Date Noted  . 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 Seasonal allergic conjunctivitis 06/24/2018  . Other allergic rhinitis 06/24/2018  . Other dysphagia 06/24/2018  . Visit for preventive health examination 12/05/2017  . Fibromyalgia 12/05/2017  . Vitamin D deficiency 11/26/2016  . Insomnia 11/26/2016  . ADHD 11/26/2016  . Obesity (BMI 30.0-34.9) 01/27/2016  . Migraine 12/29/2014  . Plantar fasciitis, bilateral 11/01/2014  . Weight gain 11/01/2014  . Abdominal cramping 02/05/2014  . Sialoadenitis 10/21/2013  . Physical exam 10/23/2010  . Dysuria 10/13/2010  . FIBROMYALGIA 11/09/2009  . Thyroid nodule 10/21/2009    Social History   Tobacco Use  . Smoking status: Former Smoker    Types: Cigarettes    Quit date: 05/07/1997    Years since quitting: 21.6  . Smokeless tobacco: Never Used  . Tobacco comment: no E cigs  Substance Use Topics  . Alcohol use: No    Alcohol/week:  0.0 standard drinks    Current Outpatient Medications:  .  acetaminophen (TYLENOL) 325 MG tablet, Take 650 mg by mouth every 6 (six) hours as needed., Disp: , Rfl:  .  amphetamine-dextroamphetamine (ADDERALL XR) 20 MG 24 hr capsule, Take 1 capsule (20 mg total) by mouth every morning., Disp: 30 capsule, Rfl: 0 .  BELSOMRA 10 MG TABS, TAKE 1 TABLET BY MOUTH AT BEDTIME, Disp: 30 tablet, Rfl: 3 .  Calcium Carbonate-Vit D-Min (CALCIUM 1200 PO), Take 1 capsule by mouth daily., Disp: , Rfl:  .  EPINEPHrine 0.3 mg/0.3 mL IJ SOAJ injection, Inject 0.3 mLs (0.3 mg total) into the muscle as needed for anaphylaxis., Disp: 1 Device, Rfl: 1 .  fluticasone (FLONASE) 50 MCG/ACT nasal spray, 2 sprays per nostril once a day if needed for stuffy nose., Disp: 16 g, Rfl: 5 .  MULTIPLE VITAMIN PO, Take 1 capsule by mouth daily., Disp: , Rfl:  .  Olopatadine HCl (PAZEO) 0.7 % SOLN, Place 1 drop into both eyes as needed (at least 10 minutes before putting in the contact lenses if needed for itchy eyes)., Disp: 1 Bottle, Rfl: 5 .  vitamin B-12 (CYANOCOBALAMIN) 500 MCG tablet, Take 500 mcg by mouth daily., Disp: , Rfl:   Allergies  Allergen Reactions  . Aspirin Anaphylaxis    Pt reports being able to take ibuprofen with no problems.  . Codeine Other (See Comments)    halucinations  . Eggs Or Egg-Derived Products Other (See Comments)  Patient states ok with flu shots, etc. No anaphalaxis.  Eggs give stomach cramps with diarrhea. After diarrhea ok.-pt eats baked goods that contains eggs with no issues   . Erythromycin Rash  . Lactose Intolerance (Gi) Diarrhea    Diarrhea with lactose.  Marland Kitchen Penicillins Rash  . Sulfonamide Derivatives Rash    Childhood reaction, has taken recently with only stomach irritation.    Objective:   BP 123/88   Pulse (!) 110   Ht 5' 3.5" (1.613 m)   Wt 160 lb (72.6 kg)   BMI 27.90 kg/m  AAOx3, NAD NCAT, EOMI No obvious CN deficits Coloring WNL Pt is able to speak clearly,  coherently without shortness of breath or increased work of breathing.  Thought process is linear.  Mood is appropriate.   Assessment and Plan:   ADHD- chronic problem.  Currently well controlled in the setting of a lot of stress.  No dose change at this time.  Will follow.  Weight gain- ongoing issue for pt.  She would like to lose 40 lbs.  Stressed that she should not look at this as a 40lb journey but rather a 2 lb journey.  And then another.  And then another.  I think she will have far more success with this than focusing on her ultimate goal.  Will follow.   Annye Asa, MD 01/01/2019

## 2019-01-07 ENCOUNTER — Ambulatory Visit (INDEPENDENT_AMBULATORY_CARE_PROVIDER_SITE_OTHER): Payer: BC Managed Care – PPO | Admitting: Psychology

## 2019-01-07 DIAGNOSIS — F4310 Post-traumatic stress disorder, unspecified: Secondary | ICD-10-CM | POA: Diagnosis not present

## 2019-01-14 ENCOUNTER — Ambulatory Visit (INDEPENDENT_AMBULATORY_CARE_PROVIDER_SITE_OTHER): Payer: BC Managed Care – PPO | Admitting: Psychology

## 2019-01-14 DIAGNOSIS — F4310 Post-traumatic stress disorder, unspecified: Secondary | ICD-10-CM

## 2019-01-21 ENCOUNTER — Ambulatory Visit (INDEPENDENT_AMBULATORY_CARE_PROVIDER_SITE_OTHER): Payer: BC Managed Care – PPO | Admitting: Psychology

## 2019-01-21 DIAGNOSIS — F4310 Post-traumatic stress disorder, unspecified: Secondary | ICD-10-CM

## 2019-01-26 ENCOUNTER — Ambulatory Visit: Payer: BC Managed Care – PPO | Admitting: Psychology

## 2019-01-28 ENCOUNTER — Ambulatory Visit (INDEPENDENT_AMBULATORY_CARE_PROVIDER_SITE_OTHER): Payer: BC Managed Care – PPO | Admitting: Psychology

## 2019-01-28 DIAGNOSIS — F4310 Post-traumatic stress disorder, unspecified: Secondary | ICD-10-CM

## 2019-02-04 ENCOUNTER — Ambulatory Visit (INDEPENDENT_AMBULATORY_CARE_PROVIDER_SITE_OTHER): Payer: BC Managed Care – PPO | Admitting: Psychology

## 2019-02-04 DIAGNOSIS — F4310 Post-traumatic stress disorder, unspecified: Secondary | ICD-10-CM

## 2019-02-09 ENCOUNTER — Ambulatory Visit: Payer: BC Managed Care – PPO | Admitting: Psychology

## 2019-02-11 ENCOUNTER — Encounter: Payer: Self-pay | Admitting: Family Medicine

## 2019-02-11 ENCOUNTER — Encounter: Payer: Self-pay | Admitting: Neurology

## 2019-02-11 ENCOUNTER — Ambulatory Visit (INDEPENDENT_AMBULATORY_CARE_PROVIDER_SITE_OTHER): Payer: BC Managed Care – PPO | Admitting: Psychology

## 2019-02-11 ENCOUNTER — Other Ambulatory Visit: Payer: Self-pay

## 2019-02-11 ENCOUNTER — Ambulatory Visit: Payer: BC Managed Care – PPO | Admitting: Neurology

## 2019-02-11 VITALS — BP 119/74 | HR 11 | Temp 97.3°F | Ht 63.5 in | Wt 168.0 lb

## 2019-02-11 DIAGNOSIS — M797 Fibromyalgia: Secondary | ICD-10-CM

## 2019-02-11 DIAGNOSIS — G43009 Migraine without aura, not intractable, without status migrainosus: Secondary | ICD-10-CM | POA: Diagnosis not present

## 2019-02-11 DIAGNOSIS — F900 Attention-deficit hyperactivity disorder, predominantly inattentive type: Secondary | ICD-10-CM

## 2019-02-11 DIAGNOSIS — F4310 Post-traumatic stress disorder, unspecified: Secondary | ICD-10-CM

## 2019-02-11 MED ORDER — FLUOXETINE HCL 10 MG PO CAPS: ORAL_CAPSULE | ORAL | 1 refills | Status: DC

## 2019-02-11 NOTE — Patient Instructions (Signed)
We will start prozac for the anxiety.

## 2019-02-11 NOTE — Progress Notes (Signed)
Reason for visit: Speech difficulty, headache  Abigail Singh is an 43 y.o. female  History of present illness:  Abigail Singh is a 43 year old right-handed white female with a history of fibromyalgia, and OCD issues.  She also reports that she has a posttraumatic stress disorder.  She is getting therapy for these issues.  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  She is being overworked, she indicates she only gets 4 to 6 hours of sleep at night, she has a young child who at night will get up with nightmares and will sleepwalk which impairs her sleep as well.  She is having increased fatigue associated with this.  She has developed a pharyngeal motor tic that has come on as she has started to work, her headaches have also become daily in nature with the stress of the job.  She has headaches that are bifrontal and occipital in nature sometimes on the top of the head or behind the eyes.  The patient may have difficulty with focusing or episodes of transient confusion.  She is on Adderall to help with this issue.  The patient has more work than she feels that she can cope with.  She comes to this office for an evaluation.  Past Medical History:  Diagnosis Date  . Allergy    sseasonal  . Anemia    as teenager  . Anxiety   . Depression   . Fibroid   . Fibromyalgia   . Heartburn in pregnancy   . History of fibromyalgia   . Migraines   . Neuromuscular disorder (HCC)    fibromyalgia  . Postpartum care following cesarean delivery 03/17/2042  . Preterm labor   . Status post cesarean delivery (repeat x 2) 03/17/2042  . Urinary tract infection     Past Surgical History:  Procedure Laterality Date  . abnormal cells in colon/tatoo area     2012  . CESAREAN SECTION    . CESAREAN SECTION  03/17/2042   Procedure: CESAREAN SECTION;  Surgeon: Marvene Staff, MD;  Location: [facility];  Service: Obstetrics;  Laterality: N/A;   Repeat C/S.  EDD: 11/43/43  . COLONOSCOPY    . DILATION AND CURETTAGE OF UTERUS    . POLYPECTOMY    . tubes in ears    . WISDOM TOOTH EXTRACTION      Family History  Problem Relation Age of Onset  . Diabetes Mother   . Diabetes Father   . Food Allergy Father   . Diabetes Maternal Grandmother   . Colon polyps Maternal Grandmother   . Diabetes Maternal Grandfather   . Colon polyps Maternal Grandfather   . Heart disease Maternal Grandfather   . Colon cancer Paternal Grandmother   . Heart disease Paternal Grandfather   . Heart failure Other        Grandfather  . Heart attack Other        x's 4 Grandfather  . Stroke Other        x's 1 Grandfather  . Diabetes Other   . Diabetes Maternal Aunt   . Allergic rhinitis Daughter   . Eczema Daughter   . Urticaria Daughter   . Asthma Daughter   . Angioedema Neg Hx   . Immunodeficiency Neg Hx     Social history:  reports that she quit smoking about 21 years ago. Her smoking use included cigarettes. She has never used smokeless tobacco.  She reports that she does not drink alcohol or use drugs.    Allergies  Allergen Reactions  . Aspirin Anaphylaxis    Pt reports being able to take ibuprofen with no problems.  . Citrus Hives and Swelling  . Codeine Other (See Comments)    halucinations  . Eggs Or Egg-Derived Products Other (See Comments)    Patient states ok with flu shots, etc. No anaphalaxis.  Eggs give stomach cramps with diarrhea. After diarrhea ok.-pt eats baked goods that contains eggs with no issues   . Erythromycin Rash  . Lactose Intolerance (Gi) Diarrhea    Diarrhea with lactose.  Marland Kitchen Penicillins Rash  . Sulfonamide Derivatives Rash    Childhood reaction, has taken recently with only stomach irritation.    Medications:  Prior to Admission medications   Medication Sig Start Date End Date Taking? Authorizing Provider  acetaminophen (TYLENOL) 325 MG tablet Take 650 mg by mouth every 6 (six) hours as needed.   Yes  [provider]  amphetamine-dextroamphetamine (ADDERALL XR) 20 MG 24 hr capsule Take 1 capsule (20 mg total) by mouth every morning. 01/01/19  Yes Midge Minium, MD  BELSOMRA 10 MG TABS TAKE 1 TABLET BY MOUTH AT BEDTIME 10/13/18  Yes Midge Minium, MD  Calcium Carbonate-Vit D-Min (CALCIUM 1200 PO) Take 1 capsule by mouth daily. Vitamin C as well   Yes [provider]  EPINEPHrine 0.3 mg/0.3 mL IJ SOAJ injection Inject 0.3 mLs (0.3 mg total) into the muscle as needed for anaphylaxis. 06/09/18  Yes Midge Minium, MD  fluticasone (FLONASE) 50 MCG/ACT nasal spray 2 sprays per nostril once a day if needed for stuffy nose. 06/24/18  Yes Bardelas, Jose A, MD  MULTIPLE VITAMIN PO Take 1 capsule by mouth daily.   Yes [provider]  Olopatadine HCl (PAZEO) 0.7 % SOLN Place 1 drop into both eyes as needed (at least 10 minutes before putting in the contact lenses if needed for itchy eyes). 06/24/18  Yes Bardelas, Jens Som, MD  vitamin B-12 (CYANOCOBALAMIN) 500 MCG tablet Take 500 mcg by mouth daily.   Yes [provider]    ROS:  Out of a complete 14 system review of symptoms, the patient complains only of the following symptoms, and all other reviewed systems are negative.  Anxiety Fatigue Neuromuscular pain  Blood pressure 119/74, pulse (!) 11, temperature (!) 97.3 F (36.3 C), temperature source Temporal, height 5' 3.5" (1.613 m), weight 168 lb (76.2 kg).  Physical Exam  General: The patient is alert and cooperative at the time of the examination.  Skin: No significant peripheral edema is noted.   Neurologic Exam  Mental status: The patient is alert and oriented x 3 at the time of the examination. The patient has apparent normal recent and remote memory, with an apparently normal attention span and concentration ability.   Cranial nerves: Facial symmetry is present. Speech is normal, no aphasia or dysarthria is noted. Extraocular movements are  full. Visual fields are full.  Motor: The patient has good strength in all 4 extremities.  Sensory examination: Soft touch sensation is symmetric on the face, arms, and legs.  Coordination: The patient has good finger-nose-finger and heel-to-shin bilaterally.  Gait and station: The patient has a normal gait. Tandem gait is normal. Romberg is negative. No drift is seen.  Reflexes: Deep tendon reflexes are symmetric.   Assessment/Plan:  1.  Reported speech disturbance  2.  Motor tic disorder  3.  Anxiety, OCD  4.  History of migraine headache  The speech again appears to be normal today.  I do not believe that the patient has a serious issue underlying her speech issues.  The patient does have a quite a bit of anxiety, she has developed some pharyngeal motor tic issues.  She will be placed on Prozac working up to 20 mg daily and she likely will need a much higher dose, she will call in several weeks for dose adjustments.  The patient will follow-up here in about 4 months.  She has now developed daily headaches associated with increased stress and lack of sleep.  Jill Alexanders MD 02/11/2019 9:28 AM  [facility] [address] [place], [place] [address]  Phone [phone] Fax [phone]

## 2019-02-11 NOTE — Telephone Encounter (Signed)
Last OV 01/01/19 adderall last filled 01/01/19 #30 with 0

## 2019-02-12 MED ORDER — AMPHETAMINE-DEXTROAMPHET ER 20 MG PO CP24: 20.0000 mg | ORAL_CAPSULE | ORAL | 0 refills | Status: DC

## 2019-02-18 ENCOUNTER — Ambulatory Visit (INDEPENDENT_AMBULATORY_CARE_PROVIDER_SITE_OTHER): Payer: BC Managed Care – PPO | Admitting: Psychology

## 2019-02-18 DIAGNOSIS — F4310 Post-traumatic stress disorder, unspecified: Secondary | ICD-10-CM

## 2019-02-23 ENCOUNTER — Ambulatory Visit: Payer: BC Managed Care – PPO | Admitting: Psychology

## 2019-02-26 ENCOUNTER — Encounter: Payer: Self-pay | Admitting: Family Medicine

## 2019-02-27 ENCOUNTER — Other Ambulatory Visit: Payer: Self-pay

## 2019-02-27 DIAGNOSIS — Z20822 Contact with and (suspected) exposure to covid-19: Secondary | ICD-10-CM

## 2019-02-28 LAB — NOVEL CORONAVIRUS, NAA: SARS-CoV-2, NAA: NOT DETECTED

## 2019-03-04 ENCOUNTER — Ambulatory Visit (INDEPENDENT_AMBULATORY_CARE_PROVIDER_SITE_OTHER): Payer: BC Managed Care – PPO | Admitting: Psychology

## 2019-03-04 DIAGNOSIS — F4310 Post-traumatic stress disorder, unspecified: Secondary | ICD-10-CM | POA: Diagnosis not present

## 2019-03-07 ENCOUNTER — Other Ambulatory Visit: Payer: Self-pay | Admitting: Neurology

## 2019-03-09 ENCOUNTER — Ambulatory Visit: Payer: BC Managed Care – PPO | Admitting: Psychology

## 2019-03-11 ENCOUNTER — Ambulatory Visit (INDEPENDENT_AMBULATORY_CARE_PROVIDER_SITE_OTHER): Payer: BC Managed Care – PPO | Admitting: Psychology

## 2019-03-11 DIAGNOSIS — F4310 Post-traumatic stress disorder, unspecified: Secondary | ICD-10-CM | POA: Diagnosis not present

## 2019-03-12 ENCOUNTER — Encounter: Payer: Self-pay | Admitting: Family Medicine

## 2019-03-12 DIAGNOSIS — F900 Attention-deficit hyperactivity disorder, predominantly inattentive type: Secondary | ICD-10-CM

## 2019-03-12 MED ORDER — AMPHETAMINE-DEXTROAMPHET ER 20 MG PO CP24: 20.0000 mg | ORAL_CAPSULE | ORAL | 0 refills | Status: DC

## 2019-03-12 NOTE — Telephone Encounter (Signed)
Last OV 01/01/19 adderall last filled 02/12/19 #30 with 0

## 2019-03-18 ENCOUNTER — Ambulatory Visit: Payer: BC Managed Care – PPO | Admitting: Psychology

## 2019-03-20 LAB — HM MAMMOGRAPHY

## 2019-03-23 ENCOUNTER — Ambulatory Visit: Payer: BC Managed Care – PPO | Admitting: Psychology

## 2019-03-25 ENCOUNTER — Ambulatory Visit: Payer: BC Managed Care – PPO | Admitting: Psychology

## 2019-03-26 LAB — HM PAP SMEAR

## 2019-03-31 ENCOUNTER — Encounter: Payer: Self-pay | Admitting: General Practice

## 2019-04-01 ENCOUNTER — Ambulatory Visit (INDEPENDENT_AMBULATORY_CARE_PROVIDER_SITE_OTHER): Payer: BC Managed Care – PPO | Admitting: Psychology

## 2019-04-01 DIAGNOSIS — F4310 Post-traumatic stress disorder, unspecified: Secondary | ICD-10-CM

## 2019-04-06 ENCOUNTER — Ambulatory Visit: Payer: BC Managed Care – PPO | Admitting: Psychology

## 2019-04-08 ENCOUNTER — Ambulatory Visit (INDEPENDENT_AMBULATORY_CARE_PROVIDER_SITE_OTHER): Payer: BC Managed Care – PPO | Admitting: Psychology

## 2019-04-08 DIAGNOSIS — F4310 Post-traumatic stress disorder, unspecified: Secondary | ICD-10-CM | POA: Diagnosis not present

## 2019-04-09 ENCOUNTER — Encounter: Payer: Self-pay | Admitting: Family Medicine

## 2019-04-09 DIAGNOSIS — F900 Attention-deficit hyperactivity disorder, predominantly inattentive type: Secondary | ICD-10-CM

## 2019-04-09 MED ORDER — AMPHETAMINE-DEXTROAMPHET ER 20 MG PO CP24: 20.0000 mg | ORAL_CAPSULE | ORAL | 0 refills | Status: DC

## 2019-04-09 NOTE — Telephone Encounter (Signed)
Last OV 01/01/19 Adderall last filled 03/12/19 #30 with 0

## 2019-04-10 ENCOUNTER — Encounter: Payer: Self-pay | Admitting: Family Medicine

## 2019-04-10 ENCOUNTER — Other Ambulatory Visit: Payer: Self-pay

## 2019-04-10 DIAGNOSIS — Z20822 Contact with and (suspected) exposure to covid-19: Secondary | ICD-10-CM

## 2019-04-11 LAB — NOVEL CORONAVIRUS, NAA: SARS-CoV-2, NAA: NOT DETECTED

## 2019-04-15 ENCOUNTER — Ambulatory Visit (INDEPENDENT_AMBULATORY_CARE_PROVIDER_SITE_OTHER): Payer: BC Managed Care – PPO | Admitting: Psychology

## 2019-04-15 DIAGNOSIS — F4310 Post-traumatic stress disorder, unspecified: Secondary | ICD-10-CM | POA: Diagnosis not present

## 2019-04-20 ENCOUNTER — Ambulatory Visit: Payer: BC Managed Care – PPO | Admitting: Psychology

## 2019-04-22 ENCOUNTER — Ambulatory Visit (INDEPENDENT_AMBULATORY_CARE_PROVIDER_SITE_OTHER): Payer: BC Managed Care – PPO | Admitting: Psychology

## 2019-04-22 DIAGNOSIS — F4310 Post-traumatic stress disorder, unspecified: Secondary | ICD-10-CM | POA: Diagnosis not present

## 2019-04-29 ENCOUNTER — Ambulatory Visit (INDEPENDENT_AMBULATORY_CARE_PROVIDER_SITE_OTHER): Payer: BC Managed Care – PPO | Admitting: Psychology

## 2019-04-29 DIAGNOSIS — F4310 Post-traumatic stress disorder, unspecified: Secondary | ICD-10-CM

## 2019-05-04 ENCOUNTER — Ambulatory Visit: Payer: BC Managed Care – PPO | Admitting: Psychology

## 2019-05-06 ENCOUNTER — Ambulatory Visit (INDEPENDENT_AMBULATORY_CARE_PROVIDER_SITE_OTHER): Payer: BC Managed Care – PPO | Admitting: Psychology

## 2019-05-06 DIAGNOSIS — F4310 Post-traumatic stress disorder, unspecified: Secondary | ICD-10-CM | POA: Diagnosis not present

## 2019-05-11 ENCOUNTER — Encounter: Payer: Self-pay | Admitting: Family Medicine

## 2019-05-11 DIAGNOSIS — F900 Attention-deficit hyperactivity disorder, predominantly inattentive type: Secondary | ICD-10-CM

## 2019-05-11 MED ORDER — AMPHETAMINE-DEXTROAMPHET ER 20 MG PO CP24: 20.0000 mg | ORAL_CAPSULE | ORAL | 0 refills | Status: DC

## 2019-05-11 NOTE — Telephone Encounter (Signed)
Last OV 01/01/19 Adderall last filled 04/09/19 #30 with 0

## 2019-05-13 ENCOUNTER — Ambulatory Visit (INDEPENDENT_AMBULATORY_CARE_PROVIDER_SITE_OTHER): Payer: BC Managed Care – PPO | Admitting: Psychology

## 2019-05-13 DIAGNOSIS — F4310 Post-traumatic stress disorder, unspecified: Secondary | ICD-10-CM | POA: Diagnosis not present

## 2019-05-19 ENCOUNTER — Encounter: Payer: Self-pay | Admitting: Family Medicine

## 2019-05-20 ENCOUNTER — Ambulatory Visit: Payer: BC Managed Care – PPO | Admitting: Psychology

## 2019-05-25 ENCOUNTER — Encounter: Payer: Self-pay | Admitting: Family Medicine

## 2019-05-27 ENCOUNTER — Ambulatory Visit (INDEPENDENT_AMBULATORY_CARE_PROVIDER_SITE_OTHER): Payer: BC Managed Care – PPO | Admitting: Psychology

## 2019-05-27 DIAGNOSIS — F4310 Post-traumatic stress disorder, unspecified: Secondary | ICD-10-CM

## 2019-06-02 ENCOUNTER — Ambulatory Visit: Payer: BC Managed Care – PPO | Attending: Internal Medicine

## 2019-06-02 DIAGNOSIS — Z20822 Contact with and (suspected) exposure to covid-19: Secondary | ICD-10-CM

## 2019-06-03 ENCOUNTER — Ambulatory Visit: Payer: BC Managed Care – PPO | Admitting: Psychology

## 2019-06-03 LAB — NOVEL CORONAVIRUS, NAA: SARS-CoV-2, NAA: NOT DETECTED

## 2019-06-10 ENCOUNTER — Ambulatory Visit (INDEPENDENT_AMBULATORY_CARE_PROVIDER_SITE_OTHER): Payer: BC Managed Care – PPO | Admitting: Psychology

## 2019-06-10 DIAGNOSIS — F4310 Post-traumatic stress disorder, unspecified: Secondary | ICD-10-CM | POA: Diagnosis not present

## 2019-06-11 ENCOUNTER — Encounter: Payer: Self-pay | Admitting: Speech Pathology

## 2019-06-11 NOTE — Therapy (Signed)
SPEECH THERAPY DISCHARGE SUMMARY  Visits from Start of Care: 4  Current functional level related to goals / functional outcomes: Please see goals below. See treatment note dated 09/11/18 for functional status. Patient did not return for therapy as her workload increased with remote learning due to Covid 19.   Remaining deficits: Unspecified/inconsistent speech disturbance   Education / Equipment: Oral resonance/abdominal breathing techniques for better control of speech production.  Plan: Patient agrees to discharge.  Patient goals were not met. Patient is being discharged due to not returning since the last visit.  ?????         SLP Short Term Goals - 06/11/19 1312      SLP SHORT TERM GOAL #1   Title  Pt will use oral resonance for [facility] vocal quality in 18/20 sentences x 2 visits.    Time  2    Period  Weeks    Status  Not Met      SLP SHORT TERM GOAL #2   Title  Pt will use abdominal breathing in 5 minutes simple conversation x2 visits.    Time  2    Period  Weeks    Status  Not Met      SLP Long Term Goals - 06/11/19 1312      SLP LONG TERM GOAL #1   Title  Pt will report fewer communication breakdowns/requests to repeat herself x 2 sessions.    Time  6    Period  Weeks    Status  Not Met      SLP LONG TERM GOAL #2   Title  Pt's voice related quality of life (V-RQOL) will improve to >/= 80 (>80 is WNL)    Time  6    Period  Weeks    Status  Not Met      SLP LONG TERM GOAL #3   Title  Pt will report receiving fewer inquiries/negative comments about her speech.    Time  6    Period  Weeks    Status  Not Met        Deneise Lever, MS, [facility] [address] [place], [place], [address] Phone: [phone]   Fax:  [phone]  Patient Details  Name: Abigail Singh MRN: [id number] Date of Birth: 04-15-1976 Referring Provider:  No ref. provider  found  Encounter Date: 06/11/2019   Aliene Altes 06/11/2019, 1:12 PM  [facility] [address] [place], [place], [address] Phone: [phone]   Fax:  [phone]

## 2019-06-12 ENCOUNTER — Encounter: Payer: Self-pay | Admitting: Family Medicine

## 2019-06-12 DIAGNOSIS — F900 Attention-deficit hyperactivity disorder, predominantly inattentive type: Secondary | ICD-10-CM

## 2019-06-12 MED ORDER — AMPHETAMINE-DEXTROAMPHET ER 20 MG PO CP24: 20.0000 mg | ORAL_CAPSULE | ORAL | 0 refills | Status: DC

## 2019-06-12 NOTE — Telephone Encounter (Signed)
Last OV 01/01/19 Adderall last filled 05/11/19 #30 with 0

## 2019-06-17 ENCOUNTER — Ambulatory Visit (INDEPENDENT_AMBULATORY_CARE_PROVIDER_SITE_OTHER): Payer: BC Managed Care – PPO | Admitting: Psychology

## 2019-06-17 DIAGNOSIS — F4310 Post-traumatic stress disorder, unspecified: Secondary | ICD-10-CM

## 2019-06-22 ENCOUNTER — Ambulatory Visit (INDEPENDENT_AMBULATORY_CARE_PROVIDER_SITE_OTHER): Payer: BC Managed Care – PPO | Admitting: Neurology

## 2019-06-22 ENCOUNTER — Encounter: Payer: Self-pay | Admitting: Neurology

## 2019-06-22 VITALS — BP 140/96 | HR 98 | Temp 97.0°F | Wt 179.0 lb

## 2019-06-22 DIAGNOSIS — G43009 Migraine without aura, not intractable, without status migrainosus: Secondary | ICD-10-CM | POA: Diagnosis not present

## 2019-06-22 MED ORDER — RIZATRIPTAN BENZOATE 10 MG PO TBDP: 10.0000 mg | ORAL_TABLET | Freq: Three times a day (TID) | ORAL | 3 refills | Status: DC | PRN

## 2019-06-22 MED ORDER — VENLAFAXINE HCL ER 37.5 MG PO CP24: ORAL_CAPSULE | ORAL | 3 refills | Status: DC

## 2019-06-22 NOTE — Patient Instructions (Signed)
We will start Effexor 37.5 mg daily for one week, then take 2 a day.  Take maxalt if needed for the headache.

## 2019-06-22 NOTE — Progress Notes (Signed)
Reason for visit: Migraine headache, pharyngeal motor tics  Abigail Singh is an 44 y.o. female  History of present illness:  Abigail Singh is a 44 year old right-handed white female with a history of migraine headache.  The patient has a history of fibromyalgia, OCD, and posttraumatic stress disorder.  The patient has a several year history of migraine headache, she had been on Topamax in the past but this was stopped.  The Topamax was helpful.  The patient has been on Imitrex or Maxalt in the past which was helpful.  Currently she takes Advil or Tylenol for her headache.  The headaches are almost every day, at least 5 days a week, she may have more severe headaches 2 or 3 times a month.  Headaches seem to worsen around her menstrual cycle.  She has continued to have a lot of stress, she has reduced her caffeine intake.  She continues to have some pharyngeal motor tics in the evenings or when she is under heightened stress.  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  The patient could not tolerate the higher doses of Prozac as this seemed to upset her stomach and cause diarrhea.  She is on 10 mg daily.  Past Medical History:  Diagnosis Date  . Allergy    sseasonal  . Anemia    as teenager  . Anxiety   . Depression   . Fibroid   . Fibromyalgia   . Heartburn in pregnancy   . History of fibromyalgia   . Migraines   . Neuromuscular disorder (HCC)    fibromyalgia  . Postpartum care following cesarean delivery 03/17/2012  . Preterm labor   . Status post cesarean delivery (repeat x 2) 03/17/2012  . Urinary tract infection     Past Surgical History:  Procedure Laterality Date  . abnormal cells in colon/tatoo area     2012  . CESAREAN SECTION    . CESAREAN SECTION  03/17/2012   Procedure: CESAREAN SECTION;  Surgeon: Marvene Staff, MD;  Location: [facility];  Service: Obstetrics;  Laterality: N/A;  Repeat C/S.  EDD:  03/23/12  . COLONOSCOPY    . DILATION AND CURETTAGE OF UTERUS    . POLYPECTOMY    . tubes in ears    . WISDOM TOOTH EXTRACTION      Family History  Problem Relation Age of Onset  . Diabetes Mother   . Diabetes Father   . Food Allergy Father   . Diabetes Maternal Grandmother   . Colon polyps Maternal Grandmother   . Diabetes Maternal Grandfather   . Colon polyps Maternal Grandfather   . Heart disease Maternal Grandfather   . Colon cancer Paternal Grandmother   . Heart disease Paternal Grandfather   . Heart failure Other        Grandfather  . Heart attack Other        x's 4 Grandfather  . Stroke Other        x's 1 Grandfather  . Diabetes Other   . Diabetes Maternal Aunt   . Allergic rhinitis Daughter   . Eczema Daughter   . Urticaria Daughter   . Asthma Daughter   . Angioedema Neg Hx   . Immunodeficiency Neg Hx     Social history:  reports that she quit smoking about 22 years ago. Her smoking use included cigarettes. She has never used smokeless tobacco. She reports that she  does not drink alcohol or use drugs.    Allergies  Allergen Reactions  . Aspirin Anaphylaxis    Pt reports being able to take ibuprofen with no problems.  . Citrus Hives and Swelling  . Codeine Other (See Comments)    halucinations  . Eggs Or Egg-Derived Products Other (See Comments)    Patient states ok with flu shots, etc. No anaphalaxis.  Eggs give stomach cramps with diarrhea. After diarrhea ok.-pt eats baked goods that contains eggs with no issues   . Erythromycin Rash  . Lactose Intolerance (Gi) Diarrhea    Diarrhea with lactose.  Marland Kitchen Penicillins Rash  . Sulfonamide Derivatives Rash    Childhood reaction, has taken recently with only stomach irritation.    Medications:  Prior to Admission medications   Medication Sig Start Date End Date Taking? Authorizing Provider  acetaminophen (TYLENOL) 325 MG tablet Take 650 mg by mouth every 6 (six) hours as needed.   Yes [provider]    amphetamine-dextroamphetamine (ADDERALL XR) 20 MG 24 hr capsule Take 1 capsule (20 mg total) by mouth every morning. 06/12/19  Yes Midge Minium, MD  BELSOMRA 10 MG TABS TAKE 1 TABLET BY MOUTH AT BEDTIME 10/13/18  Yes Midge Minium, MD  Calcium Carbonate-Vit D-Min (CALCIUM 1200 PO) Take 1 capsule by mouth daily. Vitamin C as well   Yes [provider]  EPINEPHrine 0.3 mg/0.3 mL IJ SOAJ injection Inject 0.3 mLs (0.3 mg total) into the muscle as needed for anaphylaxis. 06/09/18  Yes Midge Minium, MD  FLUoxetine (PROZAC) 10 MG capsule ONE CAPSULE DAILY FOR ONE WEEK, THEN TAKE 2 TABLETS DAILY 03/09/19  Yes Kathrynn Ducking, MD  fluticasone [facility]) 50 MCG/ACT nasal spray 2 sprays per nostril once a day if needed for stuffy nose. 06/24/18  Yes Bardelas, Jose A, MD  MULTIPLE VITAMIN PO Take 1 capsule by mouth daily.   Yes [provider]  Olopatadine HCl (PAZEO) 0.7 % SOLN Place 1 drop into both eyes as needed (at least 10 minutes before putting in the contact lenses if needed for itchy eyes). 06/24/18  Yes Bardelas, Jens Som, MD  vitamin B-12 (CYANOCOBALAMIN) 500 MCG tablet Take 500 mcg by mouth daily.   Yes [provider]    ROS:  Out of a complete 14 system review of symptoms, the patient complains only of the following symptoms, and all other reviewed systems are negative.  Anxiety, stress Headache  Blood pressure (!) 140/96, pulse 98, temperature (!) 97 F (36.1 C), weight 179 lb (81.2 kg).  Physical Exam  General: The patient is alert and cooperative at the time of the examination.  The patient is mildly obese.  Skin: No significant peripheral edema is noted.   Neurologic Exam  Mental status: The patient is alert and oriented x 3 at the time of the examination. The patient has apparent normal recent and remote memory, with an apparently normal attention span and concentration ability.   Cranial nerves: Facial symmetry is present. Speech is  normal, no aphasia or dysarthria is noted. Extraocular movements are full. Visual fields are full.  Motor: The patient has good strength in all 4 extremities.  Sensory examination: Soft touch sensation is symmetric on the face, arms, and legs.  Coordination: The patient has good finger-nose-finger and heel-to-shin bilaterally.  Gait and station: The patient has a normal gait. Tandem gait is normal. Romberg is negative. No drift is seen.  Reflexes: Deep tendon reflexes are symmetric.  Assessment/Plan:  1.  Intractable migraine headache  2.  Motor tic disorder, pharyngeal  3.  Fibromyalgia  The patient has a lot of underlying anxiety issues.  She cannot tolerate the Prozac, we will switch to Effexor at 37.5 mg daily for a week and then go to 75 mg daily, she will call for any dose adjustments.  She will be given a prescription for Maxalt to take if needed for the headache.  The patient will follow-up here in 4 months, sooner if needed.  Jill Alexanders MD 06/22/2019 8:42 AM  [facility] [address] [place], [place] [address]  Phone [phone] Fax [phone]

## 2019-06-24 ENCOUNTER — Ambulatory Visit (INDEPENDENT_AMBULATORY_CARE_PROVIDER_SITE_OTHER): Payer: BC Managed Care – PPO | Admitting: Psychology

## 2019-06-24 DIAGNOSIS — F4310 Post-traumatic stress disorder, unspecified: Secondary | ICD-10-CM

## 2019-07-01 ENCOUNTER — Ambulatory Visit (INDEPENDENT_AMBULATORY_CARE_PROVIDER_SITE_OTHER): Payer: BC Managed Care – PPO | Admitting: Psychology

## 2019-07-01 DIAGNOSIS — F4310 Post-traumatic stress disorder, unspecified: Secondary | ICD-10-CM

## 2019-07-02 ENCOUNTER — Ambulatory Visit: Payer: Self-pay

## 2019-07-02 ENCOUNTER — Ambulatory Visit: Payer: BC Managed Care – PPO | Attending: Internal Medicine

## 2019-07-02 DIAGNOSIS — Z23 Encounter for immunization: Secondary | ICD-10-CM | POA: Insufficient documentation

## 2019-07-02 NOTE — Progress Notes (Signed)
   Covid-19 Vaccination Clinic  Name:  Abigail Singh    MRN: [id number] DOB: [birth year]  07/02/2019  Abigail Singh was observed post Covid-19 immunization for 15 minutes without incidence. She was provided with Vaccine Information Sheet and instruction to access the V-Safe system.   Abigail Singh was instructed to call 911 with any severe reactions post vaccine: Marland Kitchen Difficulty breathing  . Swelling of your face and throat  . A fast heartbeat  . A bad rash all over your body  . Dizziness and weakness    Immunizations Administered    Name Date Dose VIS Date Route   Pfizer COVID-19 Vaccine 07/02/2019  3:14 PM 0.3 mL 04/17/2019 Intramuscular   Manufacturer: [place]   Lot: [id number]   [place]: [id number]

## 2019-07-06 ENCOUNTER — Other Ambulatory Visit: Payer: Self-pay | Admitting: Neurology

## 2019-07-08 ENCOUNTER — Ambulatory Visit (INDEPENDENT_AMBULATORY_CARE_PROVIDER_SITE_OTHER): Payer: BC Managed Care – PPO | Admitting: Psychology

## 2019-07-08 DIAGNOSIS — F4310 Post-traumatic stress disorder, unspecified: Secondary | ICD-10-CM

## 2019-07-10 ENCOUNTER — Encounter: Payer: Self-pay | Admitting: Family Medicine

## 2019-07-10 ENCOUNTER — Other Ambulatory Visit: Payer: Self-pay

## 2019-07-10 ENCOUNTER — Ambulatory Visit (INDEPENDENT_AMBULATORY_CARE_PROVIDER_SITE_OTHER): Payer: BC Managed Care – PPO | Admitting: Family Medicine

## 2019-07-10 VITALS — Ht 63.5 in | Wt 179.0 lb

## 2019-07-10 DIAGNOSIS — Z0000 Encounter for general adult medical examination without abnormal findings: Secondary | ICD-10-CM | POA: Diagnosis not present

## 2019-07-10 DIAGNOSIS — E669 Obesity, unspecified: Secondary | ICD-10-CM

## 2019-07-10 DIAGNOSIS — E559 Vitamin D deficiency, unspecified: Secondary | ICD-10-CM | POA: Diagnosis not present

## 2019-07-10 NOTE — Progress Notes (Signed)
Virtual Visit via Video   I connected with patient on 07/10/19 at  9:00 AM EST by a video enabled telemedicine application and verified that I am speaking with the correct person using two identifiers.  Location patient: Home Location provider: Acupuncturist, Office Persons participating in the virtual visit: Patient, Provider, [place] (Jess B)  I discussed the limitations of evaluation and management by telemedicine and the availability of in person appointments. The patient expressed understanding and agreed to proceed.  Subjective:   HPI:   CPE- UTD on colonoscopy, Tdap, pap, mammo.  Pt has gained 19 lbs since last visit.  ROS:  Patient reports no vision/ hearing changes, adenopathy,fever, weight change,  persistant/recurrent hoarseness , swallowing issues, chest pain, palpitations, edema, persistant/recurrent cough, hemoptysis, dyspnea (rest/exertional/paroxysmal nocturnal), gastrointestinal bleeding (melena, rectal bleeding), abdominal pain, significant heartburn, bowel changes, GU symptoms (dysuria, hematuria, incontinence),  syncope, focal weakness, memory loss, numbness & tingling, skin/hair/nail changes, abnormal bruising or bleeding, anxiety, or depression.   + prolonged vaginal bleeding  Patient Active Problem List   Diagnosis Date Noted  . 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 Seasonal allergic conjunctivitis 06/24/2018  . Other allergic rhinitis 06/24/2018  . Other dysphagia 06/24/2018  . Visit for preventive health examination 12/05/2017  . Fibromyalgia 12/05/2017  . Vitamin D deficiency 11/26/2016  . Insomnia 11/26/2016  . ADHD 11/26/2016  . Migraine 12/29/2014  . Plantar fasciitis, bilateral 11/01/2014  . Weight gain 11/01/2014  . Abdominal cramping 02/05/2014  . Sialoadenitis 10/21/2013  . Physical exam 10/23/2010  . Dysuria 10/13/2010  . FIBROMYALGIA 11/09/2009  . Thyroid nodule 10/21/2009    Social History   Tobacco Use  .  Smoking status: Former Smoker    Types: Cigarettes    Quit date: 05/07/1997    Years since quitting: 22.1  . Smokeless tobacco: Never Used  . Tobacco comment: no E cigs  Substance Use Topics  . Alcohol use: No    Alcohol/week: 0.0 standard drinks    Current Outpatient Medications:  .  acetaminophen (TYLENOL) 325 MG tablet, Take 650 mg by mouth every 6 (six) hours as needed., Disp: , Rfl:  .  amphetamine-dextroamphetamine (ADDERALL XR) 20 MG 24 hr capsule, Take 1 capsule (20 mg total) by mouth every morning., Disp: 30 capsule, Rfl: 0 .  BELSOMRA 10 MG TABS, TAKE 1 TABLET BY MOUTH AT BEDTIME, Disp: 30 tablet, Rfl: 3 .  Calcium Carbonate-Vit D-Min (CALCIUM 1200 PO), Take 1 capsule by mouth daily. Vitamin C as well, Disp: , Rfl:  .  EPINEPHrine 0.3 mg/0.3 mL IJ SOAJ injection, Inject 0.3 mLs (0.3 mg total) into the muscle as needed for anaphylaxis., Disp: 1 Device, Rfl: 1 .  MULTIPLE VITAMIN PO, Take 1 capsule by mouth daily., Disp: , Rfl:  .  Olopatadine HCl (PAZEO) 0.7 % SOLN, Place 1 drop into both eyes as needed (at least 10 minutes before putting in the contact lenses if needed for itchy eyes)., Disp: 1 Bottle, Rfl: 5 .  rizatriptan (MAXALT-MLT) 10 MG disintegrating tablet, Take 1 tablet (10 mg total) by mouth 3 (three) times daily as needed for migraine. May repeat in 2 hours if needed, Disp: 9 tablet, Rfl: 3 .  venlafaxine XR (EFFEXOR-XR) 37.5 MG 24 hr capsule, TAKE 1 CAPSULE BY MOUTH FOR 1 WEEK, THEN 2 CAPSULE BY MOUTH TWICE A DAY, Disp: 180 capsule, Rfl: 0 .  vitamin B-12 (CYANOCOBALAMIN) 500 MCG tablet, Take 500 mcg by mouth daily., Disp: , Rfl:  Allergies  Allergen Reactions  . Aspirin Anaphylaxis    Pt reports being able to take ibuprofen with no problems.  . Citrus Hives and Swelling  . Codeine Other (See Comments)    halucinations  . Eggs Or Egg-Derived Products Other (See Comments)    Patient states ok with flu shots, etc. No anaphalaxis.  Eggs give stomach cramps with  diarrhea. After diarrhea ok.-pt eats baked goods that contains eggs with no issues   . Erythromycin Rash  . Lactose Intolerance (Gi) Diarrhea    Diarrhea with lactose.  Marland Kitchen Penicillins Rash  . Sulfonamide Derivatives Rash    Childhood reaction, has taken recently with only stomach irritation.    Objective:   Ht 5' 3.5" (1.613 m)   Wt 179 lb (81.2 kg)   BMI 31.21 kg/m   AAOx3, NAD NCAT, EOMI No obvious CN deficits Coloring WNL Pt is able to speak clearly, coherently without shortness of breath or increased work of breathing.  Thought process is linear.  Mood is appropriate.   Assessment and Plan:   CPE- pt's limited video PE WNL.  UTD on pap, mammo, colonoscopy.  Check labs.  Anticipatory guidance provided.   Obesity- pt has gained 19 lbs since August.  Discussed [employer] and [employer] today and pt is going to investigate.  Encouraged regular exercise.  Check labs to risks stratify.  Will follow.   Annye Asa, MD 07/10/2019

## 2019-07-10 NOTE — Progress Notes (Signed)
I have discussed the procedure for the virtual visit with the patient who has given consent to proceed with assessment and treatment.   Gardenia Witter L Elihue Ebert, CMA     

## 2019-07-13 ENCOUNTER — Encounter: Payer: Self-pay | Admitting: Family Medicine

## 2019-07-13 DIAGNOSIS — F900 Attention-deficit hyperactivity disorder, predominantly inattentive type: Secondary | ICD-10-CM

## 2019-07-13 MED ORDER — AMPHETAMINE-DEXTROAMPHET ER 20 MG PO CP24: 20.0000 mg | ORAL_CAPSULE | ORAL | 0 refills | Status: DC

## 2019-07-13 NOTE — Telephone Encounter (Signed)
Last OV 07/10/19 adderall last filled 06/12/19 #30 with 0

## 2019-07-14 ENCOUNTER — Telehealth: Payer: Self-pay | Admitting: Family Medicine

## 2019-07-14 NOTE — Telephone Encounter (Signed)
LM making asking pt to call back to schedule a 109mth reck on Wt loss with [facility].

## 2019-07-15 ENCOUNTER — Ambulatory Visit: Payer: BC Managed Care – PPO | Admitting: Psychology

## 2019-07-15 ENCOUNTER — Other Ambulatory Visit: Payer: Self-pay

## 2019-07-15 ENCOUNTER — Ambulatory Visit (INDEPENDENT_AMBULATORY_CARE_PROVIDER_SITE_OTHER): Payer: BC Managed Care – PPO

## 2019-07-15 DIAGNOSIS — E669 Obesity, unspecified: Secondary | ICD-10-CM | POA: Diagnosis not present

## 2019-07-15 DIAGNOSIS — E559 Vitamin D deficiency, unspecified: Secondary | ICD-10-CM

## 2019-07-15 LAB — CBC WITH DIFFERENTIAL/PLATELET
Basophils Absolute: 0.1 10*3/uL (ref 0.0–0.1)
Basophils Relative: 1.2 % (ref 0.0–3.0)
Eosinophils Absolute: 0.1 10*3/uL (ref 0.0–0.7)
Eosinophils Relative: 1.8 % (ref 0.0–5.0)
HCT: 39.2 % (ref 36.0–46.0)
Hemoglobin: 13.3 g/dL (ref 12.0–15.0)
Lymphocytes Relative: 32.8 % (ref 12.0–46.0)
Lymphs Abs: 1.7 10*3/uL (ref 0.7–4.0)
MCHC: 33.8 g/dL (ref 30.0–36.0)
MCV: 87.6 fl (ref 78.0–100.0)
Monocytes Absolute: 0.3 10*3/uL (ref 0.1–1.0)
Monocytes Relative: 6.6 % (ref 3.0–12.0)
Neutro Abs: 3 10*3/uL (ref 1.4–7.7)
Neutrophils Relative %: 57.6 % (ref 43.0–77.0)
Platelets: 229 10*3/uL (ref 150.0–400.0)
RBC: 4.48 Mil/uL (ref 3.87–5.11)
RDW: 13.7 % (ref 11.5–15.5)
WBC: 5.2 10*3/uL (ref 4.0–10.5)

## 2019-07-15 LAB — BASIC METABOLIC PANEL
BUN: 13 mg/dL (ref 6–23)
CO2: 29 mEq/L (ref 19–32)
Calcium: 9.1 mg/dL (ref 8.4–10.5)
Chloride: 104 mEq/L (ref 96–112)
Creatinine, Ser: 0.76 mg/dL (ref 0.40–1.20)
GFR: 82.87 mL/min (ref >60.00)
Glucose, Bld: 92 mg/dL (ref 70–99)
Potassium: 4.2 mEq/L (ref 3.5–5.1)
Sodium: 138 mEq/L (ref 135–145)

## 2019-07-15 LAB — HEPATIC FUNCTION PANEL
ALT: 12 U/L (ref 0–35)
AST: 16 U/L (ref 0–37)
Albumin: 4.2 g/dL (ref 3.5–5.2)
Alkaline Phosphatase: 69 U/L (ref 39–117)
Bilirubin, Direct: 0.1 mg/dL (ref 0.0–0.3)
Total Bilirubin: 0.5 mg/dL (ref 0.2–1.2)
Total Protein: 7.3 g/dL (ref 6.0–8.3)

## 2019-07-15 LAB — LIPID PANEL
Cholesterol: 163 mg/dL (ref 0–200)
HDL: 46.5 mg/dL (ref >39.00)
LDL Cholesterol: 100 mg/dL — ABNORMAL HIGH (ref 0–99)
NonHDL: 116.65
Total CHOL/HDL Ratio: 4
Triglycerides: 83 mg/dL (ref 0.0–149.0)
VLDL: 16.6 mg/dL (ref 0.0–40.0)

## 2019-07-15 LAB — TSH: TSH: 1.73 u[IU]/mL (ref 0.35–4.50)

## 2019-07-15 LAB — VITAMIN D 25 HYDROXY (VIT D DEFICIENCY, FRACTURES): VITD: 47.88 ng/mL (ref 30.00–100.00)

## 2019-07-16 ENCOUNTER — Encounter: Payer: Self-pay | Admitting: General Practice

## 2019-07-22 ENCOUNTER — Ambulatory Visit (INDEPENDENT_AMBULATORY_CARE_PROVIDER_SITE_OTHER): Payer: BC Managed Care – PPO | Admitting: Psychology

## 2019-07-22 DIAGNOSIS — F4310 Post-traumatic stress disorder, unspecified: Secondary | ICD-10-CM | POA: Diagnosis not present

## 2019-07-29 ENCOUNTER — Ambulatory Visit: Payer: BC Managed Care – PPO | Attending: Internal Medicine

## 2019-07-29 ENCOUNTER — Ambulatory Visit (INDEPENDENT_AMBULATORY_CARE_PROVIDER_SITE_OTHER): Payer: BC Managed Care – PPO | Admitting: Psychology

## 2019-07-29 DIAGNOSIS — F4310 Post-traumatic stress disorder, unspecified: Secondary | ICD-10-CM | POA: Diagnosis not present

## 2019-07-29 DIAGNOSIS — Z23 Encounter for immunization: Secondary | ICD-10-CM

## 2019-07-29 NOTE — Progress Notes (Signed)
   Covid-19 Vaccination Clinic  Name:  RANDALL MEGA    MRN: [id number] DOB: [birth year]  07/29/2019  Ms. Esteve was observed post Covid-19 immunization for 30 minutes based on pre-vaccination screening without incident. She was provided with Vaccine Information Sheet and instruction to access the V-Safe system.   Ms. Malmborg was instructed to call 911 with any severe reactions post vaccine: Marland Kitchen Difficulty breathing  . Swelling of face and throat  . A fast heartbeat  . A bad rash all over body  . Dizziness and weakness   Immunizations Administered    Name Date Dose VIS Date Route   Pfizer COVID-19 Vaccine 07/29/2019  8:25 AM 0.3 mL 04/17/2019 Intramuscular   Manufacturer: [place]   Lot: [id number]   [place]: [id number]

## 2019-08-05 ENCOUNTER — Ambulatory Visit (INDEPENDENT_AMBULATORY_CARE_PROVIDER_SITE_OTHER): Payer: BC Managed Care – PPO | Admitting: Psychology

## 2019-08-05 DIAGNOSIS — F4310 Post-traumatic stress disorder, unspecified: Secondary | ICD-10-CM | POA: Diagnosis not present

## 2019-08-12 ENCOUNTER — Ambulatory Visit (INDEPENDENT_AMBULATORY_CARE_PROVIDER_SITE_OTHER): Payer: BC Managed Care – PPO | Admitting: Psychology

## 2019-08-12 ENCOUNTER — Encounter: Payer: Self-pay | Admitting: Family Medicine

## 2019-08-12 DIAGNOSIS — F4310 Post-traumatic stress disorder, unspecified: Secondary | ICD-10-CM | POA: Diagnosis not present

## 2019-08-12 DIAGNOSIS — F900 Attention-deficit hyperactivity disorder, predominantly inattentive type: Secondary | ICD-10-CM

## 2019-08-13 MED ORDER — AMPHETAMINE-DEXTROAMPHET ER 20 MG PO CP24: 20.0000 mg | ORAL_CAPSULE | ORAL | 0 refills | Status: DC

## 2019-08-19 ENCOUNTER — Ambulatory Visit: Payer: BC Managed Care – PPO | Admitting: Psychology

## 2019-08-25 ENCOUNTER — Encounter (INDEPENDENT_AMBULATORY_CARE_PROVIDER_SITE_OTHER): Payer: BC Managed Care – PPO | Admitting: Family Medicine

## 2019-08-25 DIAGNOSIS — N3090 Cystitis, unspecified without hematuria: Secondary | ICD-10-CM | POA: Diagnosis not present

## 2019-08-26 ENCOUNTER — Ambulatory Visit (INDEPENDENT_AMBULATORY_CARE_PROVIDER_SITE_OTHER): Payer: BC Managed Care – PPO | Admitting: Psychology

## 2019-08-26 DIAGNOSIS — F4310 Post-traumatic stress disorder, unspecified: Secondary | ICD-10-CM | POA: Diagnosis not present

## 2019-08-26 MED ORDER — CEPHALEXIN 500 MG PO CAPS: 500.0000 mg | ORAL_CAPSULE | Freq: Two times a day (BID) | ORAL | 0 refills | Status: AC

## 2019-08-26 NOTE — Telephone Encounter (Signed)
Cumulative Time: 7 minutes Consent: Pt reached out via MyChart and gave consent People Involved: Pt, CMA (Jess B), myself CC: 'I think I have a UTI' HPI: pt developed dysuria and urinary frequency on Friday night.  Similar to previous UTIs.  Denies fever, chills, N/V, flank pain. AP: cystitis.  Upon reviewing chart, pt is allergic to PCN but has tolerated Omnicef previously.  Will start empiric Keflex.  If no improvement will need UA and culture.  Pt expressed understanding and is in agreement w/ plan.

## 2019-08-30 ENCOUNTER — Encounter: Payer: Self-pay | Admitting: Family Medicine

## 2019-08-31 ENCOUNTER — Telehealth (INDEPENDENT_AMBULATORY_CARE_PROVIDER_SITE_OTHER): Payer: BC Managed Care – PPO | Admitting: Family Medicine

## 2019-08-31 ENCOUNTER — Ambulatory Visit: Payer: BC Managed Care – PPO | Attending: Internal Medicine

## 2019-08-31 ENCOUNTER — Encounter: Payer: Self-pay | Admitting: Family Medicine

## 2019-08-31 VITALS — Temp 101.6°F

## 2019-08-31 DIAGNOSIS — J329 Chronic sinusitis, unspecified: Secondary | ICD-10-CM | POA: Diagnosis not present

## 2019-08-31 DIAGNOSIS — B9689 Other specified bacterial agents as the cause of diseases classified elsewhere: Secondary | ICD-10-CM

## 2019-08-31 DIAGNOSIS — Z20822 Contact with and (suspected) exposure to covid-19: Secondary | ICD-10-CM

## 2019-08-31 MED ORDER — PROMETHAZINE-DM 6.25-15 MG/5ML PO SYRP: 5.0000 mL | ORAL_SOLUTION | Freq: Four times a day (QID) | ORAL | 0 refills | Status: DC | PRN

## 2019-08-31 MED ORDER — DOXYCYCLINE HYCLATE 100 MG PO TABS: 100.0000 mg | ORAL_TABLET | Freq: Two times a day (BID) | ORAL | 0 refills | Status: DC

## 2019-08-31 NOTE — Progress Notes (Signed)
Virtual Visit via Video   I connected with patient on 08/31/19 at  9:30 AM EDT by a video enabled telemedicine application and verified that I am speaking with the correct person using two identifiers.  Location patient: Home Location provider: Acupuncturist, Office Persons participating in the virtual visit: Patient, Provider, [place] (Katie N)  I discussed the limitations of evaluation and management by telemedicine and the availability of in person appointments. The patient expressed understanding and agreed to proceed.  Subjective:   HPI:   URI- 'I think I have a sinus infxn'.  Cough and fever started Saturday.  Sore throat and nasal congestion started ~1 week ago.  + sick contacts.  Daughter went to doctor on Saturday and dx'd w/ sinus infxn, wasn't tested for COVID.  Pt is fully vaccinated but has not been tested for COVID.  + maxillary sinus pain, swelling.  No tooth pain.  Taking OTC Mucinex Sinus Max w/o relief.  ROS:   See pertinent positives and negatives per HPI.  Patient Active Problem List   Diagnosis Date Noted  . 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 Seasonal allergic conjunctivitis 06/24/2018  . Other allergic rhinitis 06/24/2018  . Other dysphagia 06/24/2018  . Fibromyalgia 12/05/2017  . Vitamin D deficiency 11/26/2016  . Insomnia 11/26/2016  . ADHD 11/26/2016  . Obesity (BMI 30-39.9) 01/27/2016  . Migraine 12/29/2014  . Plantar fasciitis, bilateral 11/01/2014  . Weight gain 11/01/2014  . Abdominal cramping 02/05/2014  . Sialoadenitis 10/21/2013  . Physical exam 10/23/2010  . Dysuria 10/13/2010  . FIBROMYALGIA 11/09/2009  . Thyroid nodule 10/21/2009    Social History   Tobacco Use  . Smoking status: Former Smoker    Types: Cigarettes    Quit date: 05/07/1997    Years since quitting: 22.3  . Smokeless tobacco: Never Used  . Tobacco comment: no E cigs  Substance Use Topics  . Alcohol use: No    Alcohol/week: 0.0 standard  drinks    Current Outpatient Medications:  .  acetaminophen (TYLENOL) 325 MG tablet, Take 650 mg by mouth every 6 (six) hours as needed., Disp: , Rfl:  .  amphetamine-dextroamphetamine (ADDERALL XR) 20 MG 24 hr capsule, Take 1 capsule (20 mg total) by mouth every morning., Disp: 30 capsule, Rfl: 0 .  BELSOMRA 10 MG TABS, TAKE 1 TABLET BY MOUTH AT BEDTIME, Disp: 30 tablet, Rfl: 3 .  Calcium Carbonate-Vit D-Min (CALCIUM 1200 PO), Take 1 capsule by mouth daily. Vitamin C as well, Disp: , Rfl:  .  EPINEPHrine 0.3 mg/0.3 mL IJ SOAJ injection, Inject 0.3 mLs (0.3 mg total) into the muscle as needed for anaphylaxis., Disp: 1 Device, Rfl: 1 .  MULTIPLE VITAMIN PO, Take 1 capsule by mouth daily., Disp: , Rfl:  .  rizatriptan (MAXALT-MLT) 10 MG disintegrating tablet, Take 1 tablet (10 mg total) by mouth 3 (three) times daily as needed for migraine. May repeat in 2 hours if needed, Disp: 9 tablet, Rfl: 3 .  vitamin B-12 (CYANOCOBALAMIN) 500 MCG tablet, Take 500 mcg by mouth daily., Disp: , Rfl:  .  cephALEXin (KEFLEX) 500 MG capsule, Take 1 capsule (500 mg total) by mouth 2 (two) times daily for 10 doses. (Patient not taking: Reported on 08/31/2019), Disp: 10 capsule, Rfl: 0 .  Olopatadine HCl (PAZEO) 0.7 % SOLN, Place 1 drop into both eyes as needed (at least 10 minutes before putting in the contact lenses if needed for itchy eyes). (Patient not taking: Reported on  08/31/2019), Disp: 1 Bottle, Rfl: 5 .  venlafaxine XR (EFFEXOR-XR) 37.5 MG 24 hr capsule, TAKE 1 CAPSULE BY MOUTH FOR 1 WEEK, THEN 2 CAPSULE BY MOUTH TWICE A DAY (Patient not taking: Reported on 08/31/2019), Disp: 180 capsule, Rfl: 0  Allergies  Allergen Reactions  . Aspirin Anaphylaxis    Pt reports being able to take ibuprofen with no problems.  . Citrus Hives and Swelling  . Codeine Other (See Comments)    halucinations  . Eggs Or Egg-Derived Products Other (See Comments)    Patient states ok with flu shots, etc. No anaphalaxis.  Eggs give  stomach cramps with diarrhea. After diarrhea ok.-pt eats baked goods that contains eggs with no issues   . Erythromycin Rash  . Lactose Intolerance (Gi) Diarrhea    Diarrhea with lactose.  Marland Kitchen Penicillins Rash  . Sulfonamide Derivatives Rash    Childhood reaction, has taken recently with only stomach irritation.    Objective:   Temp (!) 101.6 F (38.7 C) (Temporal)  AAOx3, NAD NCAT, EOMI No obvious CN deficits + bilateral maxillary sinus swelling Coloring WNL Pt is able to speak clearly, coherently without shortness of breath or increased work of breathing.  + hacking cough Thought process is linear.  Mood is appropriate.   Assessment and Plan:   Bacterial sinusitis- new.  Pt's sxs and PE are consistent w/ infxn but given that entire household is also sick w/ fever, cough, HA, congestion there is concern for COVID.  Strongly encouraged COVID testing.  Start Doxy, cough meds prn.  Encouraged fluids, tylenol/ibuprofen, and rest.  Reviewed supportive care and red flags that should prompt return.  Pt expressed understanding and is in agreement w/ plan.    Annye Asa, MD 08/31/2019

## 2019-09-01 LAB — NOVEL CORONAVIRUS, NAA: SARS-CoV-2, NAA: NOT DETECTED

## 2019-09-01 LAB — SARS-COV-2, NAA 2 DAY TAT

## 2019-09-02 ENCOUNTER — Ambulatory Visit: Payer: BC Managed Care – PPO | Admitting: Psychology

## 2019-09-02 ENCOUNTER — Encounter: Payer: Self-pay | Admitting: Family Medicine

## 2019-09-02 MED ORDER — ALBUTEROL SULFATE HFA 108 (90 BASE) MCG/ACT IN AERS: 2.0000 | INHALATION_SPRAY | Freq: Four times a day (QID) | RESPIRATORY_TRACT | 3 refills | Status: DC | PRN

## 2019-09-04 ENCOUNTER — Other Ambulatory Visit: Payer: Self-pay | Admitting: Neurology

## 2019-09-09 ENCOUNTER — Other Ambulatory Visit (INDEPENDENT_AMBULATORY_CARE_PROVIDER_SITE_OTHER): Payer: BC Managed Care – PPO | Admitting: Psychology

## 2019-09-09 DIAGNOSIS — F4310 Post-traumatic stress disorder, unspecified: Secondary | ICD-10-CM

## 2019-09-13 ENCOUNTER — Encounter: Payer: Self-pay | Admitting: Family Medicine

## 2019-09-13 DIAGNOSIS — F900 Attention-deficit hyperactivity disorder, predominantly inattentive type: Secondary | ICD-10-CM

## 2019-09-14 ENCOUNTER — Ambulatory Visit (INDEPENDENT_AMBULATORY_CARE_PROVIDER_SITE_OTHER)
Admission: RE | Admit: 2019-09-14 | Discharge: 2019-09-14 | Disposition: A | Discharge status: Home / Self Care | Payer: BC Managed Care – PPO | Source: Ambulatory Visit | Attending: Family Medicine | Admitting: Family Medicine

## 2019-09-14 ENCOUNTER — Encounter: Payer: Self-pay | Admitting: Family Medicine

## 2019-09-14 ENCOUNTER — Other Ambulatory Visit: Payer: Self-pay

## 2019-09-14 ENCOUNTER — Telehealth (INDEPENDENT_AMBULATORY_CARE_PROVIDER_SITE_OTHER): Payer: BC Managed Care – PPO | Admitting: Family Medicine

## 2019-09-14 VITALS — HR 126

## 2019-09-14 DIAGNOSIS — R05 Cough: Secondary | ICD-10-CM | POA: Diagnosis not present

## 2019-09-14 DIAGNOSIS — R059 Cough, unspecified: Secondary | ICD-10-CM

## 2019-09-14 MED ORDER — AMPHETAMINE-DEXTROAMPHET ER 20 MG PO CP24: 20.0000 mg | ORAL_CAPSULE | ORAL | 0 refills | Status: DC

## 2019-09-14 MED ORDER — PREDNISONE 10 MG PO TABS
ORAL_TABLET | ORAL | 0 refills | Status: DC
Start: 2019-09-14 — End: 2019-09-24

## 2019-09-14 NOTE — Progress Notes (Signed)
I have discussed the procedure for the virtual visit with the patient who has given consent to proceed with assessment and treatment.   Abigail Singh L Abigail Singh, CMA     

## 2019-09-14 NOTE — Progress Notes (Signed)
Virtual Visit via Video   I connected with patient on 09/14/19 at  1:30 PM EDT by a video enabled telemedicine application and verified that I am speaking with the correct person using two identifiers.  Location patient: Home Location provider: Acupuncturist, Office Persons participating in the virtual visit: Patient, Provider, [place] (Jess B)  I discussed the limitations of evaluation and management by telemedicine and the availability of in person appointments. The patient expressed understanding and agreed to proceed.  Subjective:   HPI:   Cough- pt was given Doxycycline on 4/26 for bacterial sinusitis.  Pt reports Doxycycline 'helped a lot' but pt reports the cough has not improved.  Feels that 'it has settled in my chest.  Like a brick'.  Cough and breathing improve w/ use of inhaler.  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    ROS:   See pertinent positives and negatives per HPI.  Patient Active Problem List   Diagnosis Date Noted  . 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 Seasonal allergic conjunctivitis 06/24/2018  . Other allergic rhinitis 06/24/2018  . Other dysphagia 06/24/2018  . Fibromyalgia 12/05/2017  . Vitamin D deficiency 11/26/2016  . Insomnia 11/26/2016  . ADHD 11/26/2016  . Obesity (BMI 30-39.9) 01/27/2016  . Migraine 12/29/2014  . Plantar fasciitis, bilateral 11/01/2014  . Weight gain 11/01/2014  . Abdominal cramping 02/05/2014  . Sialoadenitis 10/21/2013  . Physical exam 10/23/2010  . Dysuria 10/13/2010  . FIBROMYALGIA 11/09/2009  . Thyroid nodule 10/21/2009    Social History   Tobacco Use  . Smoking status: Former Smoker    Types: Cigarettes    Quit date: 05/07/1997    Years since quitting: 22.3  . Smokeless tobacco: Never Used  . Tobacco comment: no E cigs  Substance Use Topics  . Alcohol use: No    Alcohol/week: 0.0 standard drinks    Current Outpatient Medications:  .   acetaminophen (TYLENOL) 325 MG tablet, Take 650 mg by mouth every 6 (six) hours as needed., Disp: , Rfl:  .  albuterol (VENTOLIN HFA) 108 (90 Base) MCG/ACT inhaler, Inhale 2 puffs into the lungs every 6 (six) hours as needed for wheezing or shortness of breath., Disp: 18 g, Rfl: 3 .  amphetamine-dextroamphetamine (ADDERALL XR) 20 MG 24 hr capsule, Take 1 capsule (20 mg total) by mouth every morning., Disp: 30 capsule, Rfl: 0 .  BELSOMRA 10 MG TABS, TAKE 1 TABLET BY MOUTH AT BEDTIME, Disp: 30 tablet, Rfl: 3 .  Calcium Carbonate-Vit D-Min (CALCIUM 1200 PO), Take 1 capsule by mouth daily. Vitamin C as well, Disp: , Rfl:  .  doxycycline (VIBRA-TABS) 100 MG tablet, Take 1 tablet (100 mg total) by mouth 2 (two) times daily., Disp: 20 tablet, Rfl: 0 .  EPINEPHrine 0.3 mg/0.3 mL IJ SOAJ injection, Inject 0.3 mLs (0.3 mg total) into the muscle as needed for anaphylaxis., Disp: 1 Device, Rfl: 1 .  MULTIPLE VITAMIN PO, Take 1 capsule by mouth daily., Disp: , Rfl:  .  Olopatadine HCl (PAZEO) 0.7 % SOLN, Place 1 drop into both eyes as needed (at least 10 minutes before putting in the contact lenses if needed for itchy eyes)., Disp: 1 Bottle, Rfl: 5 .  promethazine-dextromethorphan (PROMETHAZINE-DM) 6.25-15 MG/5ML syrup, Take 5 mLs by mouth 4 (four) times daily as needed., Disp: 240 mL, Rfl: 0 .  rizatriptan (MAXALT-MLT) 10 MG disintegrating tablet, Take 1 tablet (10 mg total) by mouth  3 (three) times daily as needed for migraine. May repeat in 2 hours if needed, Disp: 9 tablet, Rfl: 3 .  venlafaxine XR (EFFEXOR-XR) 37.5 MG 24 hr capsule, TAKE 1 CAPSULE BY MOUTH FOR 1 WEEK, THEN 2 CAPSULE BY MOUTH TWICE A DAY, Disp: 180 capsule, Rfl: 0 .  vitamin B-12 (CYANOCOBALAMIN) 500 MCG tablet, Take 500 mcg by mouth daily., Disp: , Rfl:   Allergies  Allergen Reactions  . Aspirin Anaphylaxis    Pt reports being able to take ibuprofen with no problems.  . Citrus Hives and Swelling  . Codeine Other (See Comments)     halucinations  . Eggs Or Egg-Derived Products Other (See Comments)    Patient states ok with flu shots, etc. No anaphalaxis.  Eggs give stomach cramps with diarrhea. After diarrhea ok.-pt eats baked goods that contains eggs with no issues   . Erythromycin Rash  . Lactose Intolerance (Gi) Diarrhea    Diarrhea with lactose.  Marland Kitchen Penicillins Rash  . Sulfonamide Derivatives Rash    Childhood reaction, has taken recently with only stomach irritation.    Objective:   Pulse (!) 126   SpO2 96%   AAOx3, NAD NCAT, EOMI No obvious CN deficits Coloring WNL Near continuous dry, hacking cough Thought process is linear.  Mood is appropriate.   Assessment and Plan:   Cough- deteriorated.  Completed course of Doxy w/ improvement in sinusitis sxs but cough is not improving.  She is not able to speak w/o triggering a coughing fit.  Using Albuterol q4 hrs w/ minimal relief.  Will add Prednisone taper and get chest xray to assess if additional abx are needed.  Reviewed supportive care and red flags that should prompt return.  Pt expressed understanding and is in agreement w/ plan.    Annye Asa, MD 09/14/2019

## 2019-09-14 NOTE — Telephone Encounter (Signed)
Last OV 07/10/19 Adderall last filled 08/13/19 #30 with 0

## 2019-09-15 ENCOUNTER — Encounter: Payer: Self-pay | Admitting: Family Medicine

## 2019-09-16 ENCOUNTER — Ambulatory Visit (INDEPENDENT_AMBULATORY_CARE_PROVIDER_SITE_OTHER): Payer: BC Managed Care – PPO | Admitting: Psychology

## 2019-09-16 DIAGNOSIS — F4310 Post-traumatic stress disorder, unspecified: Secondary | ICD-10-CM | POA: Diagnosis not present

## 2019-09-18 ENCOUNTER — Encounter: Payer: Self-pay | Admitting: Family Medicine

## 2019-09-22 ENCOUNTER — Encounter: Payer: Self-pay | Admitting: Family Medicine

## 2019-09-22 DIAGNOSIS — R0602 Shortness of breath: Secondary | ICD-10-CM

## 2019-09-22 DIAGNOSIS — R059 Cough, unspecified: Secondary | ICD-10-CM

## 2019-09-22 MED ORDER — BUDESONIDE-FORMOTEROL FUMARATE 160-4.5 MCG/ACT IN AERO
2.0000 | INHALATION_SPRAY | Freq: Two times a day (BID) | RESPIRATORY_TRACT | 3 refills | Status: DC
Start: 2019-09-22 — End: 2020-02-08

## 2019-09-23 ENCOUNTER — Ambulatory Visit (INDEPENDENT_AMBULATORY_CARE_PROVIDER_SITE_OTHER): Payer: BC Managed Care – PPO | Admitting: Psychology

## 2019-09-23 DIAGNOSIS — F4310 Post-traumatic stress disorder, unspecified: Secondary | ICD-10-CM | POA: Diagnosis not present

## 2019-09-23 NOTE — Addendum Note (Signed)
Addended by: Davis Gourd on: 09/23/2019 02:30 PM   Modules accepted: Orders

## 2019-09-24 ENCOUNTER — Telehealth: Payer: Self-pay | Admitting: Family Medicine

## 2019-09-24 ENCOUNTER — Other Ambulatory Visit: Payer: Self-pay

## 2019-09-24 ENCOUNTER — Encounter (HOSPITAL_COMMUNITY): Payer: Self-pay

## 2019-09-24 ENCOUNTER — Ambulatory Visit (HOSPITAL_COMMUNITY)
Admission: EM | Admit: 2019-09-24 | Discharge: 2019-09-24 | Disposition: A | Discharge status: Home / Self Care | Payer: BC Managed Care – PPO | Attending: Internal Medicine | Admitting: Internal Medicine

## 2019-09-24 DIAGNOSIS — R0602 Shortness of breath: Secondary | ICD-10-CM | POA: Diagnosis not present

## 2019-09-24 LAB — CBC WITH DIFFERENTIAL/PLATELET
Abs Immature Granulocytes: 0.03 10*3/uL (ref 0.00–0.07)
Basophils Absolute: 0 10*3/uL (ref 0.0–0.1)
Basophils Relative: 0 %
Eosinophils Absolute: 0.2 10*3/uL (ref 0.0–0.5)
Eosinophils Relative: 2 %
HCT: 42 % (ref 36.0–46.0)
Hemoglobin: 13.9 g/dL (ref 12.0–15.0)
Immature Granulocytes: 0 %
Lymphocytes Relative: 37 %
Lymphs Abs: 3.8 10*3/uL (ref 0.7–4.0)
MCH: 28.2 pg (ref 26.0–34.0)
MCHC: 33.1 g/dL (ref 30.0–36.0)
MCV: 85.2 fL (ref 80.0–100.0)
Monocytes Absolute: 0.6 10*3/uL (ref 0.1–1.0)
Monocytes Relative: 5 %
Neutro Abs: 5.7 10*3/uL (ref 1.7–7.7)
Neutrophils Relative %: 56 %
Platelets: 319 10*3/uL (ref 150–400)
RBC: 4.93 MIL/uL (ref 3.87–5.11)
RDW: 13.1 % (ref 11.5–15.5)
WBC: 10.3 10*3/uL (ref 4.0–10.5)
nRBC: 0 % (ref 0.0–0.2)

## 2019-09-24 LAB — COMPREHENSIVE METABOLIC PANEL
ALT: 30 U/L (ref 0–44)
AST: 20 U/L (ref 15–41)
Albumin: 4 g/dL (ref 3.5–5.0)
Alkaline Phosphatase: 70 U/L (ref 38–126)
Anion gap: 12 (ref 5–15)
BUN: 11 mg/dL (ref 6–20)
CO2: 21 mmol/L — ABNORMAL LOW (ref 22–32)
Calcium: 9.1 mg/dL (ref 8.9–10.3)
Chloride: 106 mmol/L (ref 98–111)
Creatinine, Ser: 0.78 mg/dL (ref 0.44–1.00)
GFR calc Af Amer: >60 mL/min (ref >60)
GFR calc non Af Amer: >60 mL/min (ref >60)
Glucose, Bld: 121 mg/dL — ABNORMAL HIGH (ref 70–99)
Potassium: 3.4 mmol/L — ABNORMAL LOW (ref 3.5–5.1)
Sodium: 139 mmol/L (ref 135–145)
Total Bilirubin: 0.6 mg/dL (ref 0.3–1.2)
Total Protein: 7.6 g/dL (ref 6.5–8.1)

## 2019-09-24 LAB — BRAIN NATRIURETIC PEPTIDE: B Natriuretic Peptide: 14.1 pg/mL (ref 0.0–100.0)

## 2019-09-24 MED ORDER — BENZONATATE 100 MG PO CAPS
100.0000 mg | ORAL_CAPSULE | Freq: Three times a day (TID) | ORAL | 0 refills | Status: DC | PRN
Start: 2019-09-24 — End: 2019-10-02

## 2019-09-24 NOTE — Telephone Encounter (Signed)
Patient notified of PCP recommendations and is agreement and expresses an understanding.  

## 2019-09-24 NOTE — Telephone Encounter (Signed)
At this point, I have done all that I know how to do- chest xray, combo inhaler, rescue inhaler, pred taper, antibiotics, pulmonary referral.  She should go to Urgent Care for evaluation so they can ensure an accurate pulse ox reading, listen to her lungs, repeat imaging if needed, and determine the next best step

## 2019-09-24 NOTE — Telephone Encounter (Signed)
I have her scheduled at 10/20/19 at pulmonary, that was their 1st available.

## 2019-09-24 NOTE — ED Triage Notes (Addendum)
Pt c/o dry cough, SOB, dizziness, light headed, 3/10 midsternal burning chest painx3 wks. Pt states fever, diarrhea 6 wks ago. Pt tested neg in April, last Friday tested neg for COVID. Pt was given steroids, inhalers, 2 atx, but not getting better. Pt tachypneic and talking in broken sentences. Lungs are clear.

## 2019-09-24 NOTE — Telephone Encounter (Signed)
Called and spoke with pt. She advised she is afraid she is under-reacting. She still is having a cough and her chest still hurts. 02 sats are consistent at 95.   Pt has had to leave work every day because she cannot breathe with mask on. She is using both inhalers regularly and finished steroids yesterday. Pt was advised that post viral coughs can last 2/4 weeks and she says that this has been ongoing for 6 weeks.   Please advise.

## 2019-09-24 NOTE — Telephone Encounter (Signed)
Pt called in stating that she is getting more short of breat, she states that she can't talk without coughing. She wanted to know at what point should she be concerned and go be seen at the Urgent care or the ER. She states that her O2 stat are normal on her meter.  I have reached out to pulmonary to try to get her scheduled. Pt can be reached the home # she is just getting concerned.

## 2019-09-25 ENCOUNTER — Encounter: Payer: Self-pay | Admitting: Family Medicine

## 2019-09-28 ENCOUNTER — Encounter: Payer: Self-pay | Admitting: Internal Medicine

## 2019-09-28 ENCOUNTER — Other Ambulatory Visit: Payer: Self-pay

## 2019-09-28 ENCOUNTER — Ambulatory Visit: Payer: BC Managed Care – PPO | Admitting: Internal Medicine

## 2019-09-28 VITALS — BP 130/90 | HR 131 | Temp 98.8°F | Ht 64.0 in | Wt 178.4 lb

## 2019-09-28 DIAGNOSIS — R058 Other specified cough: Secondary | ICD-10-CM

## 2019-09-28 DIAGNOSIS — R0602 Shortness of breath: Secondary | ICD-10-CM

## 2019-09-28 DIAGNOSIS — R05 Cough: Secondary | ICD-10-CM

## 2019-09-28 NOTE — Progress Notes (Signed)
Abigail Singh    [id number]    January 26, 1976  Primary Care Physician:Tabori, Aundra Millet, MD  Referring Physician: Midge Minium, MD [address] [place],  [place] [address] Reason for Consultation: cough and shortness of breath Date of Consultation: 09/28/2019  Chief complaint:   Chief Complaint  Patient presents with  . Consult    sob and cough, high pulse averages 120.  whole family got sick 7 weeks ago, 3 negative covid tests.     HPI: Abigail Singh is a 44 y.o. woman who presents for new patient evaluation for shortness of breath. Whole family was sick 7 weeks ago. Had a negative covid test April 26th in our system. Had another at [facility] for this episode.  (had previous ones in feb and nov after exposure but no symptoms.) Had two courses of antibiotics and 1 course of prednisone.  Prescribed symbicort inhaler - takes this twice a day and notes a big difference and worsening of her symptoms if she doesn't.  Takes albuterol - does help the shortness of breath for about 4-5 hours. Takes this every 4-6 hours.   Was seen in urgent care last week and had labs done. Was told they were normal.  Short of breath with minimal exertion (doing dishes, making dinner take a shower.)  She wakes up frequently at night.  Wakes up with shortness of breath and neuropathy (tingling in her legs and feet.)  Sleeping upright seems to help more and she coughs less.   Cough is worse with wearing a mask, movement, talking. It is dry. She does have globus sensation which has since resolved. No rhinitis symptoms. She denies reflux burn symptoms. Tessalon pearles also helping with cough.   Overall she is slowly improving since initial diagnosis but seems to be more slow than the rest of her family.   No previous history of asthma but did have allergies to trees, grass, cats etc.   In the past has seen SLP - difficulty swallowing and talking.   Notes fast HR and is on adderall. Takes  albuterol 4-5 times/day.   Social history:  Occupation: works as an Metallurgist.  Social History   Occupational History  . Not on file  Tobacco Use  . Smoking status: Former Smoker    Packs/day: 1.00    Years: 11.00    Pack years: 11.00    Types: Cigarettes    Quit date: 05/07/1997    Years since quitting: 22.4  . Smokeless tobacco: Never Used  . Tobacco comment: no E cigs  Substance and Sexual Activity  . Alcohol use: No    Alcohol/week: 0.0 standard drinks  . Drug use: No  . Sexual activity: Yes    Relevant family history:  Family History  Problem Relation Age of Onset  . Diabetes Mother   . Diabetes Father   . Food Allergy Father   . Diabetes Maternal Grandmother   . Colon polyps Maternal Grandmother   . Diabetes Maternal Grandfather   . Colon polyps Maternal Grandfather   . Heart disease Maternal Grandfather   . Colon cancer Paternal Grandmother   . Heart disease Paternal Grandfather   . Heart failure Other        Grandfather  . Heart attack Other        x's 4 Grandfather  . Stroke Other        x's 1 Grandfather  . Diabetes Other   .  Diabetes Maternal Aunt   . Allergic rhinitis Daughter   . Eczema Daughter   . Urticaria Daughter   . Asthma Daughter   . Angioedema Neg Hx   . Immunodeficiency Neg Hx     Past Medical History:  Diagnosis Date  . Allergy    sseasonal  . Anemia    as teenager  . Anxiety   . Depression   . Fibroid   . Fibromyalgia   . Heartburn in pregnancy   . History of fibromyalgia   . Migraines   . Neuromuscular disorder (HCC)    fibromyalgia  . Postpartum care following cesarean delivery 03/17/2012  . Preterm labor   . Status post cesarean delivery (repeat x 2) 03/17/2012  . Urinary tract infection     Past Surgical History:  Procedure Laterality Date  . abnormal cells in colon/tatoo area     2012  . CESAREAN SECTION    . CESAREAN SECTION  03/17/2012   Procedure: CESAREAN SECTION;  Surgeon: Marvene Staff, MD;   Location: [facility];  Service: Obstetrics;  Laterality: N/A;  Repeat C/S.  EDD: 03/23/12  . COLONOSCOPY    . DILATION AND CURETTAGE OF UTERUS    . POLYPECTOMY    . tubes in ears    . WISDOM TOOTH EXTRACTION       Physical Exam: Blood pressure 130/90, pulse (!) 131, temperature 98.8 F (37.1 C), temperature source Temporal, height _0  (1.626 m), weight 178 lb 6.4 oz (80.9 kg), SpO2 100 %. Gen:      No acute distress ENT:  no nasal polyps, mucus membranes moist, mild tonsillar enlargement Lungs:    No increased respiratory effort, symmetric chest wall excursion, clear to auscultation bilaterally, no wheezes or crackles. Good air entry bilaterally CV:         Tachycardic rate and rhythm; no murmurs, rubs, or gallops.  No pedal edema Abd:      + bowel sounds; soft, non-tender; no distension MSK: no acute synovitis of DIP or PIP joints, no mechanics hands.  Skin:      Warm and dry; no rashes Neuro: normal speech, no focal facial asymmetry Psych: alert and oriented x3, normal mood and affect   Data Reviewed/Medical Decision Making:  Independent interpretation of tests: Imaging: . Review of patient's May 2021 images revealed no acute cardiopulmonary process. The patient's images have been independently reviewed by me.    PFTs: None on file  Labs:  Lab Results  Component Value Date   WBC 10.3 09/24/2019   HGB 13.9 09/24/2019   HCT 42.0 09/24/2019   MCV 85.2 09/24/2019   PLT 319 09/24/2019   Lab Results  Component Value Date   NA 139 09/24/2019   K 3.4 (L) 09/24/2019   CL 106 09/24/2019   CO2 21 (L) 09/24/2019    Immunization status:  Immunization History  Administered Date(s) Administered  . DTP 03/16/1976, 05/18/1976, 07/20/1976, 07/12/1977, 03/16/1981  . Influenza,inj,Quad PF,6+ Mos 07/01/2015, 01/27/2016, 01/23/2017, 01/20/2018, 01/03/2019  . MMR 05/17/1977, 12/22/1987  . OPV 03/16/1976, 05/18/1976, 08/01/1977, 03/16/1981  . PFIZER SARS-COV-2 Vaccination 07/02/2019,  07/29/2019  . Td 10/31/1993  . Tdap 11/01/2011    . I reviewed prior external note(s) from urgent care, pcp . I reviewed the result(s) of the labs and imaging as noted above.  . I have ordered pfts  Assessment:  Post-viral cough syndrome  Plan/Recommendations: Ms. Luis Abed has Post-viral cough syndrome most likely secondary to recent URI.  I suspect due to her  poor vocal cord and speech issues she is at high risk for developing laryngeal irritation post viral.  I like her to see an ear nose and throat doctor to evaluate her larynx and vocal cords to see if there is any kind of obstruction or irritation.  I suspect that her symptoms will continue to improve with time and laryngeal rest.  She would probably benefit from SLP follow-up after this.  She is already on Symbicort and albuterol inhaler.  She can continue these with cough suppresent therapy.  She is moving good air on her exam and I do not think that this is a primary pulmonary issue that straining her cough.  She has minimal rhinitis symptoms also I do not think adding antihistamines or further intranasal therapy will be useful.  We can get some PFTs to rule out a primary pulmonary problem.   We discussed disease management and progression at length today.   I spent 60 minutes in the care of this patient today including pre-charting, chart review, review of results, face-to-face care, coordination of care and communication with consultants etc.).   Return to Care: Return if symptoms worsen or fail to improve, for shortness of breath, cough.  Lenice Llamas, MD [facility] [place]  CC: Midge Minium, MD

## 2019-09-28 NOTE — Patient Instructions (Signed)
Please follow up with the ENT doctors to check our your larynx (voice box) for irritation.  I suspect most of your symptoms of cough are related to this!   Ok to continue the symbicort and albuterol as needed for as long as your symptoms persist.    Please follow up with [place] again if you are not continuing to improve.

## 2019-09-29 ENCOUNTER — Other Ambulatory Visit (HOSPITAL_COMMUNITY)
Admission: RE | Admit: 2019-09-29 | Discharge: 2019-09-29 | Disposition: A | Discharge status: Home / Self Care | Payer: BC Managed Care – PPO | Source: Ambulatory Visit | Attending: Internal Medicine | Admitting: Internal Medicine

## 2019-09-29 DIAGNOSIS — Z01812 Encounter for preprocedural laboratory examination: Secondary | ICD-10-CM | POA: Diagnosis not present

## 2019-09-29 DIAGNOSIS — Z20822 Contact with and (suspected) exposure to covid-19: Secondary | ICD-10-CM | POA: Diagnosis not present

## 2019-09-29 LAB — SARS CORONAVIRUS 2 (TAT 6-24 HRS): SARS Coronavirus 2: NEGATIVE

## 2019-09-29 NOTE — ED Provider Notes (Signed)
[facility]    CSN: [id number] Arrival date & time: 09/24/19  1722      History   Chief Complaint Chief Complaint  Patient presents with  . COVID symptoms    HPI Abigail Singh is a 44 y.o. female comes to the urgent care with complaints of intermittent shortness of breath associated with periorbital numbness and tingling in the fingers.  Patient was diagnosed with viral illness.  Patient has tested negative for COVID-19 infection on 3 occasions.  Continues to have intermittent shortness of breath.  Shortness of breath is worse with wearing the mask, physical activity and often preceded by a bout of coughing.  She denies any wheezing, sputum production generalized body aches.  Patient has fibromyalgia and is currently being worked up for a neurologic condition.  She denies any dizziness, nausea or vomiting.  All her family members had similar symptoms but did not fall improved except for the patient.  She has some dizziness which is also intermittent.  HPI  Past Medical History:  Diagnosis Date  . Allergy    sseasonal  . Anemia    as teenager  . Anxiety   . Depression   . Fibroid   . Fibromyalgia   . Heartburn in pregnancy   . History of fibromyalgia   . Migraines   . Neuromuscular disorder (HCC)    fibromyalgia  . Postpartum care following cesarean delivery 03/17/2012  . Preterm labor   . Status post cesarean delivery (repeat x 2) 03/17/2012  . Urinary tract infection     Patient Active Problem List   Diagnosis Date Noted  . 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 Seasonal allergic conjunctivitis 06/24/2018  . Other allergic rhinitis 06/24/2018  . Other dysphagia 06/24/2018  . Fibromyalgia 12/05/2017  . Vitamin D deficiency 11/26/2016  . Insomnia 11/26/2016  . ADHD 11/26/2016  . Obesity (BMI 30-39.9) 01/27/2016  . Migraine 12/29/2014  . Plantar fasciitis, bilateral 11/01/2014  . Weight gain 11/01/2014  . Abdominal cramping  02/05/2014  . Sialoadenitis 10/21/2013  . Physical exam 10/23/2010  . Dysuria 10/13/2010  . FIBROMYALGIA 11/09/2009  . Thyroid nodule 10/21/2009    Past Surgical History:  Procedure Laterality Date  . abnormal cells in colon/tatoo area     2012  . CESAREAN SECTION    . CESAREAN SECTION  03/17/2012   Procedure: CESAREAN SECTION;  Surgeon: Marvene Staff, MD;  Location: [facility];  Service: Obstetrics;  Laterality: N/A;  Repeat C/S.  EDD: 03/23/12  . COLONOSCOPY    . DILATION AND CURETTAGE OF UTERUS    . POLYPECTOMY    . tubes in ears    . WISDOM TOOTH EXTRACTION      OB History    Gravida  4   Para  2   Term  1   Preterm  1   AB  2   Living  3     SAB  2   TAB  0   Ectopic  0   Multiple  1   Live Births  3            Home Medications    Prior to Admission medications   Medication Sig Start Date End Date Taking? Authorizing Provider  acetaminophen (TYLENOL) 325 MG tablet Take 650 mg by mouth every 6 (six) hours as needed.    [provider]  albuterol (VENTOLIN HFA) 108 (90 Base) MCG/ACT inhaler Inhale 2 puffs into the lungs every  6 (six) hours as needed for wheezing or shortness of breath. 09/02/19   Midge Minium, MD  amphetamine-dextroamphetamine (ADDERALL XR) 20 MG 24 hr capsule Take 1 capsule (20 mg total) by mouth every morning. 09/14/19   Midge Minium, MD  BELSOMRA 10 MG TABS TAKE 1 TABLET BY MOUTH AT BEDTIME 10/13/18   Midge Minium, MD  benzonatate (TESSALON) 100 MG capsule Take 1 capsule (100 mg total) by mouth 3 (three) times daily as needed for cough. 09/24/19   Chase Picket, MD  budesonide-formoterol (SYMBICORT) 160-4.5 MCG/ACT inhaler Inhale 2 puffs into the lungs 2 (two) times daily. 09/22/19   Midge Minium, MD  Calcium Carbonate-Vit D-Min (CALCIUM 1200 PO) Take 1 capsule by mouth daily. Vitamin C as well    [provider]  EPINEPHrine 0.3 mg/0.3 mL IJ SOAJ injection Inject 0.3 mLs (0.3 mg  total) into the muscle as needed for anaphylaxis. 06/09/18   Midge Minium, MD  MULTIPLE VITAMIN PO Take 1 capsule by mouth daily.    [provider]  promethazine-dextromethorphan (PROMETHAZINE-DM) 6.25-15 MG/5ML syrup Take 5 mLs by mouth 4 (four) times daily as needed. 08/31/19   Midge Minium, MD  rizatriptan (MAXALT-MLT) 10 MG disintegrating tablet Take 1 tablet (10 mg total) by mouth 3 (three) times daily as needed for migraine. May repeat in 2 hours if needed 06/22/19   Kathrynn Ducking, MD  venlafaxine XR (EFFEXOR-XR) 37.5 MG 24 hr capsule TAKE 1 CAPSULE BY MOUTH FOR 1 WEEK, THEN 2 CAPSULE BY MOUTH TWICE A DAY 07/06/19   Kathrynn Ducking, MD  vitamin B-12 (CYANOCOBALAMIN) 500 MCG tablet Take 500 mcg by mouth daily.    [provider]    Family History Family History  Problem Relation Age of Onset  . Diabetes Mother   . Diabetes Father   . Food Allergy Father   . Diabetes Maternal Grandmother   . Colon polyps Maternal Grandmother   . Diabetes Maternal Grandfather   . Colon polyps Maternal Grandfather   . Heart disease Maternal Grandfather   . Colon cancer Paternal Grandmother   . Heart disease Paternal Grandfather   . Heart failure Other        Grandfather  . Heart attack Other        x's 4 Grandfather  . Stroke Other        x's 1 Grandfather  . Diabetes Other   . Diabetes Maternal Aunt   . Allergic rhinitis Daughter   . Eczema Daughter   . Urticaria Daughter   . Asthma Daughter   . Angioedema Neg Hx   . Immunodeficiency Neg Hx     Social History Social History   Tobacco Use  . Smoking status: Former Smoker    Packs/day: 1.00    Years: 11.00    Pack years: 11.00    Types: Cigarettes    Quit date: 05/07/1997    Years since quitting: 22.4  . Smokeless tobacco: Never Used  . Tobacco comment: no E cigs  Substance Use Topics  . Alcohol use: No    Alcohol/week: 0.0 standard drinks  . Drug use: No     Allergies   Aspirin, Citrus,  Codeine, Eggs or egg-derived products, Erythromycin, Lactose intolerance (gi), Penicillins, and Sulfonamide derivatives   Review of Systems Review of Systems  Constitutional: Positive for activity change and fatigue. Negative for chills and fever.  HENT: Negative for congestion, rhinorrhea, sore throat and voice change.   Respiratory:  Positive for cough and shortness of breath. Negative for chest tightness and wheezing.   Gastrointestinal: Negative for diarrhea and vomiting.  Genitourinary: Negative.   Musculoskeletal: Positive for arthralgias and myalgias.  Neurological: Positive for dizziness. Negative for light-headedness, numbness and headaches.  Psychiatric/Behavioral: Negative for confusion and decreased concentration.     Physical Exam Triage Vital Signs ED Triage Vitals  Enc Vitals Group     BP 09/24/19 1813 (!) 128/103     Pulse Rate 09/24/19 1813 (!) 113     Resp 09/24/19 1813 (!) 28     Temp 09/24/19 1813 98.8 F (37.1 C)     Temp Source 09/24/19 1813 Oral     SpO2 09/24/19 1813 100 %     Weight 09/24/19 1814 170 lb (77.1 kg)     Height 09/24/19 1814 5' 3.5" (1.613 m)     Head Circumference --      Peak Flow --      Pain Score 09/24/19 1814 4     Pain Loc --      Pain Edu? --      Excl. in [place]? --    No data found.  Updated Vital Signs BP (!) 128/103   Pulse (!) 113   Temp 98.8 F (37.1 C) (Oral)   Resp (!) 28   Ht 5' 3.5" (1.613 m)   Wt 77.1 kg   SpO2 100%   BMI 29.64 kg/m   Visual Acuity Right Eye Distance:   Left Eye Distance:   Bilateral Distance:    Right Eye Near:   Left Eye Near:    Bilateral Near:     Physical Exam Vitals and nursing note reviewed.  Constitutional:      General: She is not in acute distress.    Appearance: She is not ill-appearing, toxic-appearing or diaphoretic.  HENT:     Right Ear: Tympanic membrane normal.     Left Ear: Tympanic membrane normal.     Nose: No congestion or rhinorrhea.  Cardiovascular:     Rate  and Rhythm: Normal rate and regular rhythm.     Pulses: Normal pulses.     Heart sounds: Normal heart sounds.  Pulmonary:     Effort: Pulmonary effort is normal.     Breath sounds: No wheezing or rhonchi.  Chest:     Chest wall: No tenderness.  Abdominal:     General: Abdomen is flat. Bowel sounds are normal.  Musculoskeletal:        General: Normal range of motion.  Skin:    General: Skin is warm.     Capillary Refill: Capillary refill takes less than 2 seconds.     Coloration: Skin is not jaundiced.     Findings: No erythema.  Neurological:     General: No focal deficit present.     Mental Status: She is alert and oriented to person, place, and time. Mental status is at baseline.     Cranial Nerves: No cranial nerve deficit.     Sensory: No sensory deficit.      UC Treatments / Results  Labs (all labs ordered are listed, but only abnormal results are displayed) Labs Reviewed  COMPREHENSIVE METABOLIC PANEL - Abnormal; Notable for the following components:      Result Value   Potassium 3.4 (*)    CO2 21 (*)    Glucose, Bld 121 (*)    All other components within normal limits  CBC WITH DIFFERENTIAL/PLATELET  BRAIN NATRIURETIC PEPTIDE  EKG   Radiology No results found.  Procedures Procedures (including critical care time)  Medications Ordered in UC Medications - No data to display  Initial Impression / Assessment and Plan / UC Course  I have reviewed the triage vital signs and the nursing notes.  Pertinent labs & imaging results that were available during my care of the patient were reviewed by me and considered in my medical decision making (see chart for details).     1.  Post viral cough with intermittent shortness of breath: Tessalon Perles as needed for cough Follow-up with primary care physician Return precautions given. Final Clinical Impressions(s) / UC Diagnoses   Final diagnoses:  Shortness of breath   Discharge Instructions   None     ED Prescriptions    Medication Sig Dispense Auth. Provider   benzonatate (TESSALON) 100 MG capsule Take 1 capsule (100 mg total) by mouth 3 (three) times daily as needed for cough. 21 capsule Jayne Peckenpaugh, Myrene Galas, MD     PDMP not reviewed this encounter.   Chase Picket, MD 09/29/19 2001

## 2019-09-30 ENCOUNTER — Ambulatory Visit (INDEPENDENT_AMBULATORY_CARE_PROVIDER_SITE_OTHER): Payer: BC Managed Care – PPO | Admitting: Psychology

## 2019-09-30 DIAGNOSIS — F4310 Post-traumatic stress disorder, unspecified: Secondary | ICD-10-CM

## 2019-10-02 ENCOUNTER — Other Ambulatory Visit: Payer: Self-pay

## 2019-10-02 ENCOUNTER — Ambulatory Visit (INDEPENDENT_AMBULATORY_CARE_PROVIDER_SITE_OTHER): Payer: BC Managed Care – PPO | Admitting: Internal Medicine

## 2019-10-02 ENCOUNTER — Encounter: Payer: Self-pay | Admitting: Family Medicine

## 2019-10-02 ENCOUNTER — Other Ambulatory Visit: Payer: Self-pay | Admitting: *Deleted

## 2019-10-02 DIAGNOSIS — R0602 Shortness of breath: Secondary | ICD-10-CM

## 2019-10-02 LAB — PULMONARY FUNCTION TEST
FEF 25-75 Post: 2.53 L/sec
FEF 25-75 Pre: 2.75 L/sec
FEF2575-%Change-Post: -7 %
FEF2575-%Pred-Post: 83 %
FEF2575-%Pred-Pre: 90 %
FEV1-%Change-Post: -2 %
FEV1-%Pred-Post: 95 %
FEV1-%Pred-Pre: 97 %
FEV1-Post: 2.82 L
FEV1-Pre: 2.89 L
FEV1FVC-%Change-Post: -4 %
FEV1FVC-%Pred-Pre: 96 %
FEV6-%Change-Post: 2 %
FEV6-%Pred-Post: 103 %
FEV6-%Pred-Pre: 100 %
FEV6-Post: 3.69 L
FEV6-Pre: 3.6 L
FEV6FVC-%Pred-Post: 102 %
FEV6FVC-%Pred-Pre: 102 %
FVC-%Change-Post: 1 %
FVC-%Pred-Post: 102 %
FVC-%Pred-Pre: 100 %
FVC-Post: 3.75 L
FVC-Pre: 3.68 L
Post FEV1/FVC ratio: 75 %
Post FEV6/FVC ratio: 100 %
Pre FEV1/FVC ratio: 79 %
Pre FEV6/FVC Ratio: 100 %

## 2019-10-02 LAB — NITRIC OXIDE: 11800305105: 12

## 2019-10-02 MED ORDER — BENZONATATE 100 MG PO CAPS: 100.0000 mg | ORAL_CAPSULE | Freq: Three times a day (TID) | ORAL | 0 refills | Status: DC | PRN

## 2019-10-02 NOTE — Addendum Note (Signed)
Addended by: Tery Sanfilippo R on: 10/02/2019 03:11 PM   Modules accepted: Orders

## 2019-10-02 NOTE — Progress Notes (Signed)
Spirometry pre and post 

## 2019-10-07 ENCOUNTER — Telehealth: Payer: Self-pay | Admitting: Internal Medicine

## 2019-10-07 ENCOUNTER — Ambulatory Visit (INDEPENDENT_AMBULATORY_CARE_PROVIDER_SITE_OTHER): Payer: BC Managed Care – PPO | Admitting: Psychology

## 2019-10-07 DIAGNOSIS — F4310 Post-traumatic stress disorder, unspecified: Secondary | ICD-10-CM | POA: Diagnosis not present

## 2019-10-07 NOTE — Telephone Encounter (Signed)
Advised pt of results. Pt understood and nothing further is needed.     Spero Geralds, MD  10/06/2019 9:30 AM EDT    Please call patient and let her know her PFTs were normal. Should continue plan with referrals as we discussed - happy to see her back if symptoms are not improving after that.

## 2019-10-14 ENCOUNTER — Encounter: Payer: Self-pay | Admitting: Neurology

## 2019-10-14 ENCOUNTER — Ambulatory Visit (INDEPENDENT_AMBULATORY_CARE_PROVIDER_SITE_OTHER): Payer: BC Managed Care – PPO | Admitting: Psychology

## 2019-10-14 ENCOUNTER — Other Ambulatory Visit: Payer: Self-pay

## 2019-10-14 ENCOUNTER — Ambulatory Visit: Payer: BC Managed Care – PPO | Admitting: Neurology

## 2019-10-14 VITALS — BP 136/80 | HR 124 | Ht 64.0 in | Wt 178.0 lb

## 2019-10-14 DIAGNOSIS — G43009 Migraine without aura, not intractable, without status migrainosus: Secondary | ICD-10-CM

## 2019-10-14 DIAGNOSIS — F4310 Post-traumatic stress disorder, unspecified: Secondary | ICD-10-CM

## 2019-10-14 NOTE — Progress Notes (Signed)
PATIENT: Abigail Singh DOB: 1976-05-02  REASON FOR VISIT: follow up HISTORY FROM: patient  HISTORY OF PRESENT ILLNESS: Today 10/14/19  Abigail Singh is a 44 year old female with history of migraine headache. She also has history of underlying fibromyalgia, OCD, and posttraumatic stress disorder.  When last seen, she was taking low-dose Prozac, she was switched to Effexor working up to 75 mg daily. She stopped Prozac, never started Effexor. Around Easter, she became sick with a respiratory illness, her family developed the same, multiple test for Covid were negative. Since that time, she has had chronic cough, shortness of breath. She has seen pulmonology, felt to be some sort of post viral problem, possibly something in her larynx, scheduled to see ENT. Her headaches are currently doing well, she may have headache 1 or 2 times a week, takes Advil or Tylenol with good benefit. She never took Maxalt. She has had normal breathing test. She has pharyngeal motor tics in the evening when she is under increased stress. She is on the Depo shot, says she had bleeding for almost 6 months. Presents today for evaluation unaccompanied.  HISTORY 06/22/2019 Dr. Jannifer Singh: [name] is a 44 year old right-handed white female with a history of migraine headache.  The patient has a history of fibromyalgia, OCD, and posttraumatic stress disorder.  The patient has a several year history of migraine headache, she had been on Topamax in the past but this was stopped.  The Topamax was helpful.  The patient has been on Imitrex or Maxalt in the past which was helpful.  Currently she takes Advil or Tylenol for her headache.  The headaches are almost every day, at least 5 days a week, she may have more severe headaches 2 or 3 times a month.  Headaches seem to worsen around her menstrual cycle.  She has continued to have a lot of stress, she has reduced her caffeine intake.  She continues to have some pharyngeal motor tics in the  evenings or when she is under heightened stress.  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  The patient could not tolerate the higher doses of Prozac as this seemed to upset her stomach and cause diarrhea.  She is on 10 mg daily.   REVIEW OF SYSTEMS: Out of a complete 14 system review of symptoms, the patient complains only of the following symptoms, and all other reviewed systems are negative.  Headache, cough  ALLERGIES: Allergies  Allergen Reactions  . Aspirin Anaphylaxis    Pt reports being able to take ibuprofen with no problems.  . Citrus Hives and Swelling  . Codeine Other (See Comments)    halucinations  . Eggs Or Egg-Derived Products Other (See Comments)    Patient states ok with flu shots, etc. No anaphalaxis.  Eggs give stomach cramps with diarrhea. After diarrhea ok.-pt eats baked goods that contains eggs with no issues   . Erythromycin Rash  . Lactose Intolerance (Gi) Diarrhea    Diarrhea with lactose.  Marland Kitchen Penicillins Rash  . Sulfonamide Derivatives Rash    Childhood reaction, has taken recently with only stomach irritation.    HOME MEDICATIONS: Outpatient Medications Prior to Visit  Medication Sig Dispense Refill  . acetaminophen (TYLENOL) 325 MG tablet Take 650 mg by mouth every 6 (six) hours as needed.    Marland Kitchen albuterol (VENTOLIN HFA) 108 (90 Base) MCG/ACT inhaler Inhale 2 puffs into the lungs every 6 (six)  hours as needed for wheezing or shortness of breath. 18 g 3  . amphetamine-dextroamphetamine (ADDERALL XR) 20 MG 24 hr capsule Take 1 capsule (20 mg total) by mouth every morning. 30 capsule 0  . BELSOMRA 10 MG TABS TAKE 1 TABLET BY MOUTH AT BEDTIME 30 tablet 3  . benzonatate (TESSALON) 100 MG capsule Take 1 capsule (100 mg total) by mouth 3 (three) times daily as needed for cough. 60 capsule 0  . budesonide-formoterol (SYMBICORT) 160-4.5 MCG/ACT inhaler Inhale 2 puffs into the lungs 2 (two) times daily. 1  Inhaler 3  . Calcium Carbonate-Vit D-Min (CALCIUM 1200 PO) Take 1 capsule by mouth daily. Vitamin C as well    . EPINEPHrine 0.3 mg/0.3 mL IJ SOAJ injection Inject 0.3 mLs (0.3 mg total) into the muscle as needed for anaphylaxis. 1 Device 1  . medroxyPROGESTERone (DEPO-PROVERA) 150 MG/ML injection 1 (ONE) MILLILITER(S) EVERY 11 12 WEEKS    . MULTIPLE VITAMIN PO Take 1 capsule by mouth daily.    . promethazine-dextromethorphan (PROMETHAZINE-DM) 6.25-15 MG/5ML syrup Take 5 mLs by mouth 4 (four) times daily as needed. 240 mL 0  . rizatriptan (MAXALT-MLT) 10 MG disintegrating tablet Take 1 tablet (10 mg total) by mouth 3 (three) times daily as needed for migraine. May repeat in 2 hours if needed 9 tablet 3  . vitamin B-12 (CYANOCOBALAMIN) 500 MCG tablet Take 500 mcg by mouth daily.    Marland Kitchen venlafaxine XR (EFFEXOR-XR) 37.5 MG 24 hr capsule TAKE 1 CAPSULE BY MOUTH FOR 1 WEEK, THEN 2 CAPSULE BY MOUTH TWICE A DAY 180 capsule 0   No facility-administered medications prior to visit.    PAST MEDICAL HISTORY: Past Medical History:  Diagnosis Date  . Allergy    sseasonal  . Anemia    as teenager  . Anxiety   . Depression   . Fibroid   . Fibromyalgia   . Heartburn in pregnancy   . History of fibromyalgia   . Migraines   . Neuromuscular disorder ([facility])    fibromyalgia  . Postpartum care following cesarean delivery 03/17/2012  . Preterm labor   . Status post cesarean delivery (repeat x 2) 03/17/2012  . Urinary tract infection     PAST SURGICAL HISTORY: Past Surgical History:  Procedure Laterality Date  . abnormal cells in colon/tatoo area     2012  . CESAREAN SECTION    . CESAREAN SECTION  03/17/2012   Procedure: CESAREAN SECTION;  Surgeon: Marvene Staff, MD;  Location: [facility];  Service: Obstetrics;  Laterality: N/A;  Repeat C/S.  EDD: 03/23/12  . COLONOSCOPY    . DILATION AND CURETTAGE OF UTERUS    . POLYPECTOMY    . tubes in ears    . WISDOM TOOTH EXTRACTION      FAMILY  HISTORY: Family History  Problem Relation Age of Onset  . Diabetes Mother   . Diabetes Father   . Food Allergy Father   . Diabetes Maternal Grandmother   . Colon polyps Maternal Grandmother   . Diabetes Maternal Grandfather   . Colon polyps Maternal Grandfather   . Heart disease Maternal Grandfather   . Colon cancer Paternal Grandmother   . Heart disease Paternal Grandfather   . Heart failure Other        Grandfather  . Heart attack Other        x's 4 Grandfather  . Stroke Other        x's 1 Grandfather  . Diabetes Other   .  Diabetes Maternal Aunt   . Allergic rhinitis Daughter   . Eczema Daughter   . Urticaria Daughter   . Asthma Daughter   . Angioedema Neg Hx   . Immunodeficiency Neg Hx     SOCIAL HISTORY: Social History   Socioeconomic History  . Marital status: Married    Spouse name: Not on file  . Number of children: Not on file  . Years of education: Not on file  . Highest education level: Not on file  Occupational History  . Not on file  Tobacco Use  . Smoking status: Former Smoker    Packs/day: 1.00    Years: 11.00    Pack years: 11.00    Types: Cigarettes    Quit date: 05/07/1997    Years since quitting: 22.4  . Smokeless tobacco: Never Used  . Tobacco comment: no E cigs  Substance and Sexual Activity  . Alcohol use: No    Alcohol/week: 0.0 standard drinks  . Drug use: No  . Sexual activity: Yes  Other Topics Concern  . Not on file  Social History Narrative  . Not on file   Social Determinants of Health   Financial Resource Strain:   . Difficulty of Paying Living Expenses:   Food Insecurity:   . Worried About Charity fundraiser in the Last Year:   . Arboriculturist in the Last Year:   Transportation Needs:   . Film/video editor (Medical):   Marland Kitchen Lack of Transportation (Non-Medical):   Physical Activity:   . Days of Exercise per Week:   . Minutes of Exercise per Session:   Stress:   . Feeling of Stress :   Social Connections:   .  Frequency of Communication with Friends and Family:   . Frequency of Social Gatherings with Friends and Family:   . Attends Religious Services:   . Active Member of Clubs or Organizations:   . Attends Archivist Meetings:   Marland Kitchen Marital Status:   Intimate Partner Violence:   . Fear of Current or Ex-Partner:   . Emotionally Abused:   Marland Kitchen Physically Abused:   . Sexually Abused:    PHYSICAL EXAM  Vitals:   10/14/19 0825  BP: 136/80  Pulse: (!) 124  Weight: 178 lb (80.7 kg)  Height: 5\' 4"  (1.626 m)   Body mass index is 30.55 kg/m.  Generalized: Well developed, in no acute distress, frequent dry cough, short of breath with prolonged speaking  Neurological examination  Mentation: Alert oriented to time, place, history taking. Follows all commands speech and language fluent Cranial nerve II-XII: Pupils were equal round reactive to light. Extraocular movements were full, visual field were full on confrontational test. Facial sensation and strength were normal. Head turning and shoulder shrug  were normal and symmetric. Motor: The motor testing reveals 5 over 5 strength of all 4 extremities. Good symmetric motor tone is noted throughout.  Sensory: Sensory testing is intact to soft touch on all 4 extremities. No evidence of extinction is noted.  Coordination: Cerebellar testing reveals good finger-nose-finger and heel-to-shin bilaterally.  Gait and station: Gait is normal. Tandem gait is normal. Romberg is negative. No drift is seen.  Reflexes: Deep tendon reflexes are symmetric and normal bilaterally.   DIAGNOSTIC DATA (LABS, IMAGING, TESTING) - I reviewed patient records, labs, notes, testing and imaging myself where available.  Lab Results  Component Value Date   WBC 10.3 09/24/2019   HGB 13.9 09/24/2019   HCT 42.0  09/24/2019   MCV 85.2 09/24/2019   PLT 319 09/24/2019      Component Value Date/Time   NA 139 09/24/2019 1836   K 3.4 (L) 09/24/2019 1836   CL 106 09/24/2019  1836   CO2 21 (L) 09/24/2019 1836   GLUCOSE 121 (H) 09/24/2019 1836   BUN 11 09/24/2019 1836   CREATININE 0.78 09/24/2019 1836   CALCIUM 9.1 09/24/2019 1836   PROT 7.6 09/24/2019 1836   ALBUMIN 4.0 09/24/2019 1836   AST 20 09/24/2019 1836   ALT 30 09/24/2019 1836   ALKPHOS 70 09/24/2019 1836   BILITOT 0.6 09/24/2019 1836   GFRNONAA >60 09/24/2019 1836   GFRAA >60 09/24/2019 1836   Lab Results  Component Value Date   CHOL 163 07/15/2019   HDL 46.50 07/15/2019   LDLCALC 100 (H) 07/15/2019   TRIG 83.0 07/15/2019   CHOLHDL 4 07/15/2019   Lab Results  Component Value Date   HGBA1C 5.6 12/05/2017   No results found for: VITAMINB12 Lab Results  Component Value Date   TSH 1.73 07/15/2019    ASSESSMENT AND PLAN 44 y.o. year old female  has a past medical history of Allergy, Anemia, Anxiety, Depression, Fibroid, Fibromyalgia, Heartburn in pregnancy, History of fibromyalgia, Migraines, Neuromuscular disorder ([place]), Postpartum care following cesarean delivery (03/17/2012), Preterm labor, Status post cesarean delivery (repeat x 2) (03/17/2012), and Urinary tract infection. here with:  1. Intractable migraine headache 2. Motor tic disorder, pharyngeal 3. Fibromyalgia 4. Chronic cough, possible post viral illness  Her headaches remain fairly well controlled, she never started the Effexor prescribed at her last visit. She never took Maxalt. She has been dealing with a post viral cough, and shortness of breath, following some sort of viral illness around April, multiple tests for Covid have been negative. She has seen pulmonology, and will be seeing ENT. Does not wish to start any medications for headaches at this time. Her current headaches seem to be well controlled with Tylenol or ibuprofen 1-2 times a week. Her heart rate was noted to be elevated 124 today, however coughing, short of breath at the time, should be followed. We will continue to follow her over time, I encouraged her to  reach out via MyChart with any concerns, I will see her back in 6 months or sooner if needed.  I spent 20 minutes of face-to-face and non-face-to-face time with patient.  This included previsit chart review, lab review, study review, order entry, electronic health record documentation, patient education.  Butler Denmark, AGNP-C, DNP 10/14/2019, 8:49 AM [facility] [address] [place], [place] [address] [phone]

## 2019-10-14 NOTE — Patient Instructions (Signed)
Discontinue the Effexor You can take Maxalt as needed See you back in 6 months

## 2019-10-14 NOTE — Progress Notes (Signed)
I have read the note, and I agree with the clinical assessment and plan.  Adalin Vanderploeg K Haifa Hatton   

## 2019-10-16 ENCOUNTER — Other Ambulatory Visit: Payer: Self-pay

## 2019-10-16 ENCOUNTER — Encounter: Payer: Self-pay | Admitting: Family Medicine

## 2019-10-16 DIAGNOSIS — F900 Attention-deficit hyperactivity disorder, predominantly inattentive type: Secondary | ICD-10-CM

## 2019-10-16 MED ORDER — AMPHETAMINE-DEXTROAMPHET ER 20 MG PO CP24: 20.0000 mg | ORAL_CAPSULE | ORAL | 0 refills | Status: DC

## 2019-10-16 NOTE — Telephone Encounter (Signed)
Adderall last filled 09/14/19 #30 with no refills LOV 10/14/19 NOV 01/06/20

## 2019-10-20 ENCOUNTER — Institutional Professional Consult (permissible substitution): Payer: BC Managed Care – PPO | Admitting: Internal Medicine

## 2019-10-21 ENCOUNTER — Ambulatory Visit (INDEPENDENT_AMBULATORY_CARE_PROVIDER_SITE_OTHER): Payer: BC Managed Care – PPO | Admitting: Psychology

## 2019-10-21 DIAGNOSIS — F4310 Post-traumatic stress disorder, unspecified: Secondary | ICD-10-CM | POA: Diagnosis not present

## 2019-10-28 ENCOUNTER — Ambulatory Visit (INDEPENDENT_AMBULATORY_CARE_PROVIDER_SITE_OTHER): Payer: BC Managed Care – PPO | Admitting: Psychology

## 2019-10-28 DIAGNOSIS — F4310 Post-traumatic stress disorder, unspecified: Secondary | ICD-10-CM

## 2019-10-29 ENCOUNTER — Encounter: Payer: Self-pay | Admitting: Family Medicine

## 2019-11-04 ENCOUNTER — Ambulatory Visit (INDEPENDENT_AMBULATORY_CARE_PROVIDER_SITE_OTHER): Payer: BC Managed Care – PPO | Admitting: Psychology

## 2019-11-04 DIAGNOSIS — F4310 Post-traumatic stress disorder, unspecified: Secondary | ICD-10-CM

## 2019-11-11 ENCOUNTER — Ambulatory Visit: Payer: BC Managed Care – PPO | Admitting: Psychology

## 2019-11-16 ENCOUNTER — Encounter: Payer: Self-pay | Admitting: Family Medicine

## 2019-11-16 DIAGNOSIS — F900 Attention-deficit hyperactivity disorder, predominantly inattentive type: Secondary | ICD-10-CM

## 2019-11-16 MED ORDER — AMPHETAMINE-DEXTROAMPHET ER 20 MG PO CP24: 20.0000 mg | ORAL_CAPSULE | ORAL | 0 refills | Status: DC

## 2019-11-18 ENCOUNTER — Ambulatory Visit (INDEPENDENT_AMBULATORY_CARE_PROVIDER_SITE_OTHER): Payer: BC Managed Care – PPO | Admitting: Psychology

## 2019-11-18 DIAGNOSIS — F4310 Post-traumatic stress disorder, unspecified: Secondary | ICD-10-CM | POA: Diagnosis not present

## 2019-11-23 ENCOUNTER — Encounter: Payer: Self-pay | Admitting: Family Medicine

## 2019-11-24 ENCOUNTER — Encounter: Payer: Self-pay | Admitting: Physician Assistant

## 2019-11-24 ENCOUNTER — Other Ambulatory Visit: Payer: Self-pay

## 2019-11-24 ENCOUNTER — Ambulatory Visit: Payer: BC Managed Care – PPO | Admitting: Physician Assistant

## 2019-11-24 VITALS — BP 110/78 | HR 118 | Temp 97.9°F | Resp 14 | Ht 64.0 in | Wt 181.0 lb

## 2019-11-24 DIAGNOSIS — H6982 Other specified disorders of Eustachian tube, left ear: Secondary | ICD-10-CM

## 2019-11-24 MED ORDER — TRIAMCINOLONE ACETONIDE 55 MCG/ACT NA AERO: 2.0000 | INHALATION_SPRAY | Freq: Every day | NASAL | 1 refills | Status: AC

## 2019-11-24 NOTE — Patient Instructions (Signed)
Please continue your daily antihistamine. Start the Nasacort once daily as directed.  This should help alleviate pressure behind the ears. I do recommend you follow-up with your ENT provider as scheduled.  Please let us know if symptoms are not improving/resolving or if anything worsens or new symptoms develop.  Hang in there!

## 2019-11-24 NOTE — Progress Notes (Signed)
Patient presents to clinic today c/o pressure and popping of the L ear over the past week. Denies any nasal congestion or other URI symptoms. Denies tinnitus or change in hearing. Denies drainage from the ear. No symptoms of R ear. Notes she has been followed by ENT for her R ear, currently having a healing TM perforation.   Past Medical History:  Diagnosis Date   Allergy    sseasonal   Anemia    as teenager   Anxiety    Depression    Fibroid    Fibromyalgia    Heartburn in pregnancy    History of fibromyalgia    Migraines    Neuromuscular disorder ([place])    fibromyalgia   Postpartum care following cesarean delivery 03/17/2012   Preterm labor    Status post cesarean delivery (repeat x 2) 03/17/2012   Urinary tract infection     Current Outpatient Medications on File Prior to Visit  Medication Sig Dispense Refill   acetaminophen (TYLENOL) 325 MG tablet Take 650 mg by mouth every 6 (six) hours as needed.     albuterol (VENTOLIN HFA) 108 (90 Base) MCG/ACT inhaler Inhale 2 puffs into the lungs every 6 (six) hours as needed for wheezing or shortness of breath. 18 g 3   amphetamine-dextroamphetamine (ADDERALL XR) 20 MG 24 hr capsule Take 1 capsule (20 mg total) by mouth every morning. 30 capsule 0   BELSOMRA 10 MG TABS TAKE 1 TABLET BY MOUTH AT BEDTIME 30 tablet 3   budesonide-formoterol (SYMBICORT) 160-4.5 MCG/ACT inhaler Inhale 2 puffs into the lungs 2 (two) times daily. 1 Inhaler 3   Calcium Carbonate-Vit D-Min (CALCIUM 1200 PO) Take 1 capsule by mouth daily. Vitamin C as well     EPINEPHrine 0.3 mg/0.3 mL IJ SOAJ injection Inject 0.3 mLs (0.3 mg total) into the muscle as needed for anaphylaxis. 1 Device 1   medroxyPROGESTERone (DEPO-PROVERA) 150 MG/ML injection 1 (ONE) MILLILITER(S) EVERY 11 12 WEEKS     MULTIPLE VITAMIN PO Take 1 capsule by mouth daily.     rizatriptan (MAXALT-MLT) 10 MG disintegrating tablet Take 1 tablet (10 mg total) by mouth 3 (three)  times daily as needed for migraine. May repeat in 2 hours if needed 9 tablet 3   vitamin B-12 (CYANOCOBALAMIN) 500 MCG tablet Take 500 mcg by mouth daily.     No current facility-administered medications on file prior to visit.    Allergies  Allergen Reactions   Aspirin Anaphylaxis    Pt reports being able to take ibuprofen with no problems.   Citrus Hives and Swelling   Codeine Other (See Comments)    halucinations   Eggs Or Egg-Derived Products Other (See Comments)    Patient states ok with flu shots, etc. No anaphalaxis.  Eggs give stomach cramps with diarrhea. After diarrhea ok.-pt eats baked goods that contains eggs with no issues    Erythromycin Rash   Lactose Intolerance (Gi) Diarrhea    Diarrhea with lactose.   Penicillins Rash   Sulfonamide Derivatives Rash    Childhood reaction, has taken recently with only stomach irritation.    Family History  Problem Relation Age of Onset   Diabetes Mother    Diabetes Father    Food Allergy Father    Diabetes Maternal Grandmother    Colon polyps Maternal Grandmother    Diabetes Maternal Grandfather    Colon polyps Maternal Grandfather    Heart disease Maternal Grandfather    Colon cancer Paternal Grandmother  Heart disease Paternal Grandfather    Heart failure Other        Grandfather   Heart attack Other        x's 4 Grandfather   Stroke Other        x's 1 Grandfather   Diabetes Other    Diabetes Maternal Aunt    Allergic rhinitis Daughter    Eczema Daughter    Urticaria Daughter    Asthma Daughter    Angioedema Neg Hx    Immunodeficiency Neg Hx     Social History   Socioeconomic History   Marital status: Married    Spouse name: Not on file   Number of children: Not on file   Years of education: Not on file   Highest education level: Not on file  Occupational History   Not on file  Tobacco Use   Smoking status: Former Smoker    Packs/day: 1.00    Years: 11.00    Pack  years: 11.00    Types: Cigarettes    Quit date: 05/07/1997    Years since quitting: 22.5   Smokeless tobacco: Never Used   Tobacco comment: no E cigs  Vaping Use   Vaping Use: Never used  Substance and Sexual Activity   Alcohol use: No    Alcohol/week: 0.0 standard drinks   Drug use: No   Sexual activity: Yes  Other Topics Concern   Not on file  Social History Narrative   Not on file   Social Determinants of Health   Financial Resource Strain:    Difficulty of Paying Living Expenses:   Food Insecurity:    Worried About Charity fundraiser in the Last Year:    Arboriculturist in the Last Year:   Transportation Needs:    Film/video editor (Medical):    Lack of Transportation (Non-Medical):   Physical Activity:    Days of Exercise per Week:    Minutes of Exercise per Session:   Stress:    Feeling of Stress :   Social Connections:    Frequency of Communication with Friends and Family:    Frequency of Social Gatherings with Friends and Family:    Attends Religious Services:    Active Member of Clubs or Organizations:    Attends Archivist Meetings:    Marital Status:     Review of Systems - See HPI.  All other ROS are negative.  BP 110/78    Pulse (!) 118    Temp 97.9 F (36.6 C) (Temporal)    Resp 14    Ht 5\' 4"  (1.626 m)    Wt 181 lb (82.1 kg)    SpO2 98%    BMI 31.07 kg/m   Physical Exam Vitals reviewed.  Constitutional:      Appearance: Normal appearance.  HENT:     Head: Normocephalic and atraumatic.     Right Ear: Ear canal and external ear normal. There is no impacted cerumen. Tympanic membrane is perforated (healing well -- followed by ENT.).     Left Ear: Ear canal and external ear normal. A middle ear effusion (seroud fluid) is present. There is no impacted cerumen.     Mouth/Throat:     Mouth: Mucous membranes are moist.  Eyes:     Conjunctiva/sclera: Conjunctivae normal.  Cardiovascular:     Rate and Rhythm: Normal  rate and regular rhythm.     Heart sounds: Normal heart sounds.  Pulmonary:  Effort: Pulmonary effort is normal.     Breath sounds: Normal breath sounds.  Musculoskeletal:     Cervical back: Neck supple.  Neurological:     General: No focal deficit present.     Mental Status: She is alert and oriented to person, place, and time.     Recent Results (from the past 2160 hour(s))  Novel Coronavirus, NAA ([facility])     Status: None   Collection Time: 08/31/19 10:51 AM   Specimen: Nasopharyngeal(NP) swabs in vial transport medium   NASOPHARYNGE  TESTING  Result Value Ref Range   SARS-CoV-2, NAA Not Detected Not Detected    Comment: This nucleic acid amplification test was developed and its performance characteristics determined by [employer]. Nucleic acid amplification tests include RT-PCR and TMA. This test has not been FDA cleared or approved. This test has been authorized by FDA under an Emergency Use Authorization (EUA). This test is only authorized for the duration of time the declaration that circumstances exist justifying the authorization of the emergency use of in vitro diagnostic tests for detection of SARS-CoV-2 virus and/or diagnosis of COVID-19 infection under section 564(b)(1) of the Act, 21 U.S.C. 401UUV-2(Z) (1), unless the authorization is terminated or revoked sooner. When diagnostic testing is negative, the possibility of a false negative result should be considered in the context of a patient's recent exposures and the presence of clinical signs and symptoms consistent with COVID-19. An individual without symptoms of COVID-19 and who is not shedding SARS-CoV-2 virus wo uld expect to have a negative (not detected) result in this assay.   SARS-COV-2, NAA 2 DAY TAT     Status: None   Collection Time: 08/31/19 10:51 AM   NASOPHARYNGE  TESTING  Result Value Ref Range   SARS-CoV-2, NAA 2 DAY TAT Performed   CBC with Differential     Status: None    Collection Time: 09/24/19  6:36 PM  Result Value Ref Range   WBC 10.3 4.0 - 10.5 K/uL   RBC 4.93 3.87 - 5.11 MIL/uL   Hemoglobin 13.9 12.0 - 15.0 g/dL   HCT 42.0 36 - 46 %   MCV 85.2 80.0 - 100.0 fL   MCH 28.2 26.0 - 34.0 pg   MCHC 33.1 30.0 - 36.0 g/dL   RDW 13.1 11.5 - 15.5 %   Platelets 319 150 - 400 K/uL   nRBC 0.0 0.0 - 0.2 %   Neutrophils Relative % 56 %   Neutro Abs 5.7 1.7 - 7.7 K/uL   Lymphocytes Relative 37 %   Lymphs Abs 3.8 0.7 - 4.0 K/uL   Monocytes Relative 5 %   Monocytes Absolute 0.6 0 - 1 K/uL   Eosinophils Relative 2 %   Eosinophils Absolute 0.2 0 - 0 K/uL   Basophils Relative 0 %   Basophils Absolute 0.0 0 - 0 K/uL   Immature Granulocytes 0 %   Abs Immature Granulocytes 0.03 0.00 - 0.07 K/uL    Comment: Performed at [facility], [address], [place],  [address]  Comprehensive metabolic panel     Status: Abnormal   Collection Time: 09/24/19  6:36 PM  Result Value Ref Range   Sodium 139 135 - 145 mmol/L   Potassium 3.4 (L) 3.5 - 5.1 mmol/L   Chloride 106 98 - 111 mmol/L   CO2 21 (L) 22 - 32 mmol/L   Glucose, Bld 121 (H) 70 - 99 mg/dL    Comment: Glucose reference range applies only to  samples taken after fasting for at least 8 hours.   BUN 11 6 - 20 mg/dL   Creatinine, Ser 0.78 0.44 - 1.00 mg/dL   Calcium 9.1 8.9 - 10.3 mg/dL   Total Protein 7.6 6.5 - 8.1 g/dL   Albumin 4.0 3.5 - 5.0 g/dL   AST 20 15 - 41 U/L   ALT 30 0 - 44 U/L   Alkaline Phosphatase 70 38 - 126 U/L   Total Bilirubin 0.6 0.3 - 1.2 mg/dL   GFR calc non Af Amer >60 >60 mL/min   GFR calc Af Amer >60 >60 mL/min   Anion gap 12 5 - 15    Comment: Performed at [facility], [place] [address] [place], [place] [address]  Brain natriuretic peptide     Status: None   Collection Time: 09/24/19  8:00 PM  Result Value Ref Range   B Natriuretic Peptide 14.1 0.0 - 100.0 pg/mL    Comment: Performed at [facility] [address], [place], [place] [address]  SARS  CORONAVIRUS 2 (TAT 6-24 HRS) Nasopharyngeal Nasopharyngeal Swab     Status: None   Collection Time: 09/29/19  8:35 AM   Specimen: Nasopharyngeal Swab  Result Value Ref Range   SARS Coronavirus 2 NEGATIVE NEGATIVE    Comment: (NOTE) SARS-CoV-2 target nucleic acids are NOT DETECTED. The SARS-CoV-2 RNA is generally detectable in upper and lower respiratory specimens during the acute phase of infection. Negative results do not preclude SARS-CoV-2 infection, do not rule out co-infections with other pathogens, and should not be used as the sole basis for treatment or other patient management decisions. Negative results must be combined with clinical observations, patient history, and epidemiological information. The expected result is Negative. Fact Sheet for Patients: SugarRoll.be Fact Sheet for Healthcare Providers: https://www.woods-mathews.com/ This test is not yet approved or cleared by the [place] FDA and  has been authorized for detection and/or diagnosis of SARS-CoV-2 by FDA under an Emergency Use Authorization (EUA). This EUA will remain  in effect (meaning this test can be used) for the duration of the COVID-19 declaration under Section 56 4(b)(1) of the Act, 21 U.S.C. section 360bbb-3(b)(1), unless the authorization is terminated or revoked sooner. Performed at [facility], [place] [address], [place], [place] [address]   Pulmonary Function Test     Status: None (Preliminary result)   Collection Time: 10/02/19  1:47 PM  Result Value Ref Range   FVC-Pre 3.68 L   FVC-%Pred-Pre 100 %   FVC-Post 3.75 L   FVC-%Pred-Post 102 %   FVC-%Change-Post 1 %   FEV1-Pre 2.89 L   FEV1-%Pred-Pre 97 %   FEV1-Post 2.82 L   FEV1-%Pred-Post 95 %   FEV1-%Change-Post -2 %   FEV6-Pre 3.60 L   FEV6-%Pred-Pre 100 %   FEV6-Post 3.69 L   FEV6-%Pred-Post 103 %   FEV6-%Change-Post 2 %   Pre FEV1/FVC ratio 79 %   FEV1FVC-%Pred-Pre 96 %   Post  FEV1/FVC ratio 75 %   FEV1FVC-%Change-Post -4 %   Pre FEV6/FVC Ratio 100 %   FEV6FVC-%Pred-Pre 102 %   Post FEV6/FVC ratio 100 %   FEV6FVC-%Pred-Post 102 %   FEF 25-75 Pre 2.75 L/sec   FEF2575-%Pred-Pre 90 %   FEF 25-75 Post 2.53 L/sec   FEF2575-%Pred-Post 83 %   FEF2575-%Change-Post -7 %  Nitric oxide     Status: None (In process)   Collection Time: 10/02/19  3:10 PM  Result Value Ref Range  [id number] 10     Assessment/Plan: 1. Eustachian tube dysfunction, left Start Nasacort daily in addition to her daily antihistamine. Needs to avoid nasal self insufflation due to healing R TM perforation. She is to schedule follow-up with her ENT for further management if symptoms are not resolving.  - triamcinolone (NASACORT) 55 MCG/ACT AERO nasal inhaler; Place 2 sprays into the nose daily.  Dispense: 1 Inhaler; Refill: 1  This visit occurred during the SARS-CoV-2 public health emergency.  Safety protocols were in place, including screening questions prior to the visit, additional usage of staff PPE, and extensive cleaning of exam room while observing appropriate contact time as indicated for disinfecting solutions.     Leeanne Rio, PA-C

## 2019-11-25 ENCOUNTER — Ambulatory Visit: Payer: BC Managed Care – PPO | Admitting: Psychology

## 2019-12-08 ENCOUNTER — Encounter: Payer: Self-pay | Admitting: General Practice

## 2019-12-16 ENCOUNTER — Ambulatory Visit (INDEPENDENT_AMBULATORY_CARE_PROVIDER_SITE_OTHER): Payer: BC Managed Care – PPO | Admitting: Psychology

## 2019-12-16 DIAGNOSIS — F4310 Post-traumatic stress disorder, unspecified: Secondary | ICD-10-CM

## 2019-12-17 ENCOUNTER — Encounter: Payer: Self-pay | Admitting: Family Medicine

## 2019-12-17 DIAGNOSIS — F900 Attention-deficit hyperactivity disorder, predominantly inattentive type: Secondary | ICD-10-CM

## 2019-12-17 MED ORDER — AMPHETAMINE-DEXTROAMPHET ER 20 MG PO CP24: 20.0000 mg | ORAL_CAPSULE | ORAL | 0 refills | Status: DC

## 2019-12-17 NOTE — Telephone Encounter (Signed)
Last OV 11/24/19 adderall last filled 11/16/19 #30 with 0

## 2019-12-30 ENCOUNTER — Ambulatory Visit (INDEPENDENT_AMBULATORY_CARE_PROVIDER_SITE_OTHER): Payer: BC Managed Care – PPO | Admitting: Psychology

## 2019-12-30 DIAGNOSIS — F4310 Post-traumatic stress disorder, unspecified: Secondary | ICD-10-CM

## 2020-01-01 ENCOUNTER — Other Ambulatory Visit: Payer: Self-pay | Admitting: Family Medicine

## 2020-01-06 ENCOUNTER — Telehealth (INDEPENDENT_AMBULATORY_CARE_PROVIDER_SITE_OTHER): Payer: BC Managed Care – PPO | Admitting: Family Medicine

## 2020-01-06 ENCOUNTER — Encounter: Payer: Self-pay | Admitting: Family Medicine

## 2020-01-06 ENCOUNTER — Other Ambulatory Visit: Payer: Self-pay

## 2020-01-06 VITALS — HR 122 | Ht 64.0 in | Wt 180.0 lb

## 2020-01-06 DIAGNOSIS — Z8616 Personal history of COVID-19: Secondary | ICD-10-CM | POA: Diagnosis not present

## 2020-01-06 DIAGNOSIS — R05 Cough: Secondary | ICD-10-CM

## 2020-01-06 DIAGNOSIS — E669 Obesity, unspecified: Secondary | ICD-10-CM | POA: Diagnosis not present

## 2020-01-06 DIAGNOSIS — R053 Chronic cough: Secondary | ICD-10-CM

## 2020-01-06 NOTE — Progress Notes (Signed)
Virtual Visit via Video   I connected with patient on 01/06/20 at  8:30 AM EDT by a video enabled telemedicine application and verified that I am speaking with the correct person using two identifiers.  Location patient: Home Location provider: Acupuncturist, Office Persons participating in the virtual visit: Patient, Provider, [place] (Jess B)  I discussed the limitations of evaluation and management by telemedicine and the availability of in person appointments. The patient expressed understanding and agreed to proceed.  Subjective:   HPI:   Obesity- pt's weight is stable since her CPE 6 months ago.  BMI is 30.90.  Unable to exercise based on persistent COVID sxs.  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  Has ongoing fatigue, 'more than usual teacher fatigue'.  Is now monitoring O2 sats via AppleWatch.  Currently on Symbicort.  Has seen Pulmonary and ENT.  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  Unable to walk up steps.  ROS:   See pertinent positives and negatives per HPI.  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   Seasonal allergic conjunctivitis 06/24/2018   Other allergic rhinitis 06/24/2018   Other dysphagia 06/24/2018   Fibromyalgia 12/05/2017   Vitamin D deficiency 11/26/2016   Insomnia 11/26/2016   ADHD 11/26/2016   Obesity (BMI 30-39.9) 01/27/2016   Migraine 12/29/2014   Plantar fasciitis, bilateral 11/01/2014   Weight gain 11/01/2014   Abdominal cramping 02/05/2014   Sialoadenitis 10/21/2013   Physical exam 10/23/2010   Dysuria 10/13/2010   FIBROMYALGIA 11/09/2009   Thyroid nodule 10/21/2009    Social History   Tobacco Use   Smoking status: Former Smoker    Packs/day: 1.00    Years: 11.00    Pack years: 11.00    Types: Cigarettes    Quit date:  05/07/1997    Years since quitting: 22.6   Smokeless tobacco: Never Used   Tobacco comment: no E cigs  Substance Use Topics   Alcohol use: No    Alcohol/week: 0.0 standard drinks    Current Outpatient Medications:    acetaminophen (TYLENOL) 325 MG tablet, Take 650 mg by mouth every 6 (six) hours as needed., Disp: , Rfl:    albuterol (VENTOLIN HFA) 108 (90 Base) MCG/ACT inhaler, TAKE 2 PUFFS BY MOUTH EVERY 6 HOURS AS NEEDED FOR WHEEZE OR SHORTNESS OF BREATH, Disp: 1 each, Rfl: 3   amphetamine-dextroamphetamine (ADDERALL XR) 20 MG 24 hr capsule, Take 1 capsule (20 mg total) by mouth every morning., Disp: 30 capsule, Rfl: 0   BELSOMRA 10 MG TABS, TAKE 1 TABLET BY MOUTH AT BEDTIME, Disp: 30 tablet, Rfl: 3   budesonide-formoterol (SYMBICORT) 160-4.5 MCG/ACT inhaler, Inhale 2 puffs into the lungs 2 (two) times daily., Disp: 1 Inhaler, Rfl: 3   Calcium Carbonate-Vit D-Min (CALCIUM 1200 PO), Take 1 capsule by mouth daily. Vitamin C as well, Disp: , Rfl:    EPINEPHrine 0.3 mg/0.3 mL IJ SOAJ injection, Inject 0.3 mLs (0.3 mg total) into the muscle as needed for anaphylaxis., Disp: 1 Device, Rfl: 1   gabapentin (NEURONTIN) 100 MG capsule, Take 100 mg by mouth 3 (three) times daily., Disp: , Rfl:    medroxyPROGESTERone (DEPO-PROVERA) 150 MG/ML injection, 1 (ONE) MILLILITER(S) EVERY 11 12 WEEKS, Disp: , Rfl:    MULTIPLE VITAMIN PO, Take 1 capsule by  mouth daily., Disp: , Rfl:    rizatriptan (MAXALT-MLT) 10 MG disintegrating tablet, Take 1 tablet (10 mg total) by mouth 3 (three) times daily as needed for migraine. May repeat in 2 hours if needed, Disp: 9 tablet, Rfl: 3   triamcinolone (NASACORT) 55 MCG/ACT AERO nasal inhaler, Place 2 sprays into the nose daily., Disp: 1 Inhaler, Rfl: 1   vitamin B-12 (CYANOCOBALAMIN) 500 MCG tablet, Take 500 mcg by mouth daily., Disp: , Rfl:   Allergies  Allergen Reactions   Aspirin Anaphylaxis    Pt reports being able to take ibuprofen with no problems.    Citrus Hives and Swelling   Codeine Other (See Comments)    halucinations   Eggs Or Egg-Derived Products Other (See Comments)    Patient states ok with flu shots, etc. No anaphalaxis.  Eggs give stomach cramps with diarrhea. After diarrhea ok.-pt eats baked goods that contains eggs with no issues    Erythromycin Rash   Lactose Intolerance (Gi) Diarrhea    Diarrhea with lactose.   Penicillins Rash   Sulfonamide Derivatives Rash    Childhood reaction, has taken recently with only stomach irritation.    Objective:   Pulse (!) 122    Ht 5\' 4"  (1.626 m)    Wt 180 lb (81.6 kg)    BMI 30.90 kg/m   AAOx3, NAD NCAT, EOMI No obvious CN deficits Coloring WNL Pt is able to speak clearly but has SOB when speaking in paragraphs.  + dry, hacking cough Thought process is linear.  Mood is appropriate.   Assessment and Plan:   Obesity- ongoing issue for pt.  She is very limited in what she is able to do at this time due to her long haul [place].  Discussed healthy diet as she continues her long, slow recovery.  Will follow.  Hx of COVID- despite negative test results this spring, she clearly had all of the symptoms of COVID and is now struggling w/ long haul sxs.  She is unable to speak in paragraphs w/o SOB.  She has a continual cough and relies on her albuterol inhaler.  She is also on a controller inhaler (Symbicort).  Has seen Pulm and ENT.  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  Will continue to follow.  Chronic cough- see above.  Has seen both Pulm and ENT.  On albuterol and Symbicort.  Now on Gabapentin.   Annye Asa, MD 01/06/2020

## 2020-01-11 ENCOUNTER — Encounter: Payer: Self-pay | Admitting: Family Medicine

## 2020-01-11 DIAGNOSIS — F900 Attention-deficit hyperactivity disorder, predominantly inattentive type: Secondary | ICD-10-CM

## 2020-01-12 MED ORDER — AMPHETAMINE-DEXTROAMPHET ER 20 MG PO CP24: 20.0000 mg | ORAL_CAPSULE | ORAL | 0 refills | Status: DC

## 2020-01-12 NOTE — Telephone Encounter (Signed)
Last OV 7/20/1 adderall last filled 12/17/19 #30 with 0

## 2020-01-13 ENCOUNTER — Ambulatory Visit (INDEPENDENT_AMBULATORY_CARE_PROVIDER_SITE_OTHER): Payer: BC Managed Care – PPO | Admitting: Psychology

## 2020-01-13 DIAGNOSIS — F4310 Post-traumatic stress disorder, unspecified: Secondary | ICD-10-CM

## 2020-01-19 ENCOUNTER — Other Ambulatory Visit: Payer: BC Managed Care – PPO

## 2020-01-19 ENCOUNTER — Other Ambulatory Visit: Payer: Self-pay | Admitting: Critical Care Medicine

## 2020-01-19 DIAGNOSIS — Z20822 Contact with and (suspected) exposure to covid-19: Secondary | ICD-10-CM

## 2020-01-21 LAB — SARS-COV-2, NAA 2 DAY TAT

## 2020-01-21 LAB — NOVEL CORONAVIRUS, NAA: SARS-CoV-2, NAA: NOT DETECTED

## 2020-01-26 ENCOUNTER — Other Ambulatory Visit: Payer: BC Managed Care – PPO

## 2020-02-06 ENCOUNTER — Other Ambulatory Visit: Payer: Self-pay | Admitting: Family Medicine

## 2020-02-07 ENCOUNTER — Encounter: Payer: Self-pay | Admitting: Family Medicine

## 2020-02-10 ENCOUNTER — Ambulatory Visit (INDEPENDENT_AMBULATORY_CARE_PROVIDER_SITE_OTHER): Payer: BC Managed Care – PPO | Admitting: Psychology

## 2020-02-10 DIAGNOSIS — F4310 Post-traumatic stress disorder, unspecified: Secondary | ICD-10-CM

## 2020-02-12 ENCOUNTER — Encounter: Payer: Self-pay | Admitting: Family Medicine

## 2020-02-12 DIAGNOSIS — F900 Attention-deficit hyperactivity disorder, predominantly inattentive type: Secondary | ICD-10-CM

## 2020-02-15 MED ORDER — AMPHETAMINE-DEXTROAMPHET ER 20 MG PO CP24: 20.0000 mg | ORAL_CAPSULE | ORAL | 0 refills | Status: DC

## 2020-02-19 ENCOUNTER — Encounter: Payer: Self-pay | Admitting: Family Medicine

## 2020-02-19 MED ORDER — PREDNISONE 10 MG PO TABS
ORAL_TABLET | ORAL | 0 refills | Status: DC
Start: 2020-02-19 — End: 2020-05-18

## 2020-02-19 NOTE — Telephone Encounter (Signed)
Rx called in 

## 2020-02-24 ENCOUNTER — Ambulatory Visit (INDEPENDENT_AMBULATORY_CARE_PROVIDER_SITE_OTHER): Payer: BC Managed Care – PPO | Admitting: Psychology

## 2020-02-24 DIAGNOSIS — F4310 Post-traumatic stress disorder, unspecified: Secondary | ICD-10-CM

## 2020-03-09 ENCOUNTER — Ambulatory Visit (INDEPENDENT_AMBULATORY_CARE_PROVIDER_SITE_OTHER): Payer: BC Managed Care – PPO | Admitting: Psychology

## 2020-03-09 DIAGNOSIS — F4310 Post-traumatic stress disorder, unspecified: Secondary | ICD-10-CM

## 2020-03-13 ENCOUNTER — Encounter: Payer: Self-pay | Admitting: Family Medicine

## 2020-03-13 DIAGNOSIS — F900 Attention-deficit hyperactivity disorder, predominantly inattentive type: Secondary | ICD-10-CM

## 2020-03-14 MED ORDER — AMPHETAMINE-DEXTROAMPHET ER 20 MG PO CP24: 20.0000 mg | ORAL_CAPSULE | ORAL | 0 refills | Status: DC

## 2020-03-23 ENCOUNTER — Ambulatory Visit: Payer: BC Managed Care – PPO | Admitting: Psychology

## 2020-04-06 ENCOUNTER — Ambulatory Visit (INDEPENDENT_AMBULATORY_CARE_PROVIDER_SITE_OTHER): Payer: BC Managed Care – PPO | Admitting: Psychology

## 2020-04-06 DIAGNOSIS — F4310 Post-traumatic stress disorder, unspecified: Secondary | ICD-10-CM

## 2020-04-12 ENCOUNTER — Encounter: Payer: Self-pay | Admitting: Family Medicine

## 2020-04-12 DIAGNOSIS — F900 Attention-deficit hyperactivity disorder, predominantly inattentive type: Secondary | ICD-10-CM

## 2020-04-12 MED ORDER — AMPHETAMINE-DEXTROAMPHET ER 20 MG PO CP24: 20.0000 mg | ORAL_CAPSULE | ORAL | 0 refills | Status: DC

## 2020-04-13 ENCOUNTER — Ambulatory Visit (INDEPENDENT_AMBULATORY_CARE_PROVIDER_SITE_OTHER): Payer: BC Managed Care – PPO | Admitting: Psychology

## 2020-04-13 ENCOUNTER — Ambulatory Visit: Payer: BC Managed Care – PPO | Admitting: Neurology

## 2020-04-13 DIAGNOSIS — F4310 Post-traumatic stress disorder, unspecified: Secondary | ICD-10-CM | POA: Diagnosis not present

## 2020-04-20 ENCOUNTER — Ambulatory Visit (INDEPENDENT_AMBULATORY_CARE_PROVIDER_SITE_OTHER): Payer: BC Managed Care – PPO | Admitting: Psychology

## 2020-04-20 DIAGNOSIS — F4310 Post-traumatic stress disorder, unspecified: Secondary | ICD-10-CM | POA: Diagnosis not present

## 2020-04-28 ENCOUNTER — Encounter: Payer: Self-pay | Admitting: Family Medicine

## 2020-04-28 MED ORDER — TRAZODONE HCL 50 MG PO TABS: 25.0000 mg | ORAL_TABLET | Freq: Every evening | ORAL | 3 refills | Status: DC | PRN

## 2020-05-04 ENCOUNTER — Ambulatory Visit (INDEPENDENT_AMBULATORY_CARE_PROVIDER_SITE_OTHER): Payer: BC Managed Care – PPO | Admitting: Psychology

## 2020-05-04 DIAGNOSIS — F4310 Post-traumatic stress disorder, unspecified: Secondary | ICD-10-CM

## 2020-05-07 DIAGNOSIS — J189 Pneumonia, unspecified organism: Secondary | ICD-10-CM

## 2020-05-07 DIAGNOSIS — I5189 Other ill-defined heart diseases: Secondary | ICD-10-CM

## 2020-05-07 HISTORY — DX: Pneumonia, unspecified organism: J18.9

## 2020-05-07 HISTORY — DX: Other ill-defined heart diseases: I51.89

## 2020-05-14 ENCOUNTER — Encounter: Payer: Self-pay | Admitting: Family Medicine

## 2020-05-16 ENCOUNTER — Other Ambulatory Visit: Payer: Self-pay

## 2020-05-16 DIAGNOSIS — F900 Attention-deficit hyperactivity disorder, predominantly inattentive type: Secondary | ICD-10-CM

## 2020-05-16 MED ORDER — AMPHETAMINE-DEXTROAMPHET ER 20 MG PO CP24: 20.0000 mg | ORAL_CAPSULE | ORAL | 0 refills | Status: DC

## 2020-05-16 NOTE — Telephone Encounter (Signed)
LFD 04/12/20 #30 with no refill LOV 01/06/20 NOV 07/11/20

## 2020-05-16 NOTE — Telephone Encounter (Signed)
Would recommend video visit to assess further.

## 2020-05-18 ENCOUNTER — Other Ambulatory Visit: Payer: Self-pay

## 2020-05-18 ENCOUNTER — Telehealth: Payer: Self-pay | Admitting: Family Medicine

## 2020-05-18 ENCOUNTER — Telehealth (INDEPENDENT_AMBULATORY_CARE_PROVIDER_SITE_OTHER): Payer: BC Managed Care – PPO | Admitting: Physician Assistant

## 2020-05-18 ENCOUNTER — Ambulatory Visit (INDEPENDENT_AMBULATORY_CARE_PROVIDER_SITE_OTHER): Payer: BC Managed Care – PPO | Admitting: Psychology

## 2020-05-18 ENCOUNTER — Encounter: Payer: Self-pay | Admitting: Physician Assistant

## 2020-05-18 DIAGNOSIS — F4310 Post-traumatic stress disorder, unspecified: Secondary | ICD-10-CM | POA: Diagnosis not present

## 2020-05-18 DIAGNOSIS — J4531 Mild persistent asthma with (acute) exacerbation: Secondary | ICD-10-CM

## 2020-05-18 DIAGNOSIS — Z20822 Contact with and (suspected) exposure to covid-19: Secondary | ICD-10-CM

## 2020-05-18 MED ORDER — BENZONATATE 100 MG PO CAPS
100.0000 mg | ORAL_CAPSULE | Freq: Three times a day (TID) | ORAL | 0 refills | Status: DC | PRN
Start: 2020-05-18 — End: 2020-06-09

## 2020-05-18 MED ORDER — AZITHROMYCIN 250 MG PO TABS
ORAL_TABLET | ORAL | 0 refills | Status: DC
Start: 2020-05-18 — End: 2020-05-31

## 2020-05-18 MED ORDER — PREDNISONE 20 MG PO TABS
40.0000 mg | ORAL_TABLET | Freq: Every day | ORAL | 0 refills | Status: DC
Start: 2020-05-18 — End: 2020-05-31

## 2020-05-18 NOTE — Addendum Note (Signed)
Addended by: Brunetta Jeans on: 05/18/2020 01:48 PM   Modules accepted: Orders

## 2020-05-18 NOTE — Progress Notes (Signed)
I have discussed the procedure for the virtual visit with the patient who has given consent to proceed with assessment and treatment.   Kathleen Likins S Kailo Kosik, CMA     

## 2020-05-18 NOTE — Telephone Encounter (Signed)
Spoke to PT regarding BPA where pt reported a fever of 100.9. Pt is struggling with a cough.  Pt was seen at her PCP this morning where she was given 2 Rx cough medicines. Pt also has 2 inhalers; albuterol and Simbacort. Pt is also wearing a pulse ox to monitor O2 sats. Discussed with Pt importance seeking immediate medical care if O2 sats are low.  Pt agrees with plan.

## 2020-05-18 NOTE — Progress Notes (Signed)
Virtual Visit via Video   I connected with patient on 05/18/20 at 11:30 AM EST by a video enabled telemedicine application and verified that I am speaking with the correct person using two identifiers.  Location patient: Home Location provider: [facility] Persons participating in the virtual visit: Patient, Provider, Joffre (Patina Moore)  I discussed the limitations of evaluation and management by telemedicine and the availability of in person appointments. The patient expressed understanding and agreed to proceed.  Subjective:   HPI:   Patient presents via [facility] today c/o 1 week of URI symptoms starting with headache and closely followed by fever, chills, aches and then Saturday woke up with significant sore throat and chest congestion with cough. Notes O2 stable 90-94, improving with use of inhaler.  Notes she was tested initially for COVID and this was negative. Children started with similar symptoms so they were tested yesterday at which time she was retested. Is waiting for those results. Has had to increase use of her albuterol.   ROS:   See pertinent positives and negatives per HPI.  Patient Active Problem List   Diagnosis Date Noted  . 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 Seasonal allergic conjunctivitis 06/24/2018  . Other allergic rhinitis 06/24/2018  . Other dysphagia 06/24/2018  . Fibromyalgia 12/05/2017  . Vitamin D deficiency 11/26/2016  . Insomnia 11/26/2016  . ADHD 11/26/2016  . Obesity (BMI 30-39.9) 01/27/2016  . Migraine 12/29/2014  . Plantar fasciitis, bilateral 11/01/2014  . Weight gain 11/01/2014  . Abdominal cramping 02/05/2014  . Sialoadenitis 10/21/2013  . Physical exam 10/23/2010  . Dysuria 10/13/2010  . FIBROMYALGIA 11/09/2009  . Thyroid nodule 10/21/2009    Social History   Tobacco Use  . Smoking status: Former Smoker    Packs/day: 1.00    Years: 11.00    Pack years: 11.00    Types: Cigarettes     Quit date: 05/07/1997    Years since quitting: 23.0  . Smokeless tobacco: Never Used  . Tobacco comment: no E cigs  Substance Use Topics  . Alcohol use: No    Alcohol/week: 0.0 standard drinks    Current Outpatient Medications:  .  acetaminophen (TYLENOL) 325 MG tablet, Take 650 mg by mouth every 6 (six) hours as needed., Disp: , Rfl:  .  albuterol (VENTOLIN HFA) 108 (90 Base) MCG/ACT inhaler, TAKE 2 PUFFS BY MOUTH EVERY 6 HOURS AS NEEDED FOR WHEEZE OR SHORTNESS OF BREATH, Disp: 1 each, Rfl: 3 .  amphetamine-dextroamphetamine (ADDERALL XR) 20 MG 24 hr capsule, Take 1 capsule (20 mg total) by mouth every morning., Disp: 30 capsule, Rfl: 0 .  BELSOMRA 10 MG TABS, TAKE 1 TABLET BY MOUTH AT BEDTIME, Disp: 30 tablet, Rfl: 3 .  Calcium Carbonate-Vit D-Min (CALCIUM 1200 PO), Take 1 capsule by mouth daily. Vitamin C as well, Disp: , Rfl:  .  EPINEPHrine 0.3 mg/0.3 mL IJ SOAJ injection, Inject 0.3 mLs (0.3 mg total) into the muscle as needed for anaphylaxis., Disp: 1 Device, Rfl: 1 .  gabapentin (NEURONTIN) 100 MG capsule, Take 100 mg by mouth 3 (three) times daily., Disp: , Rfl:  .  medroxyPROGESTERone (DEPO-PROVERA) 150 MG/ML injection, 1 (ONE) MILLILITER(S) EVERY 11 12 WEEKS, Disp: , Rfl:  .  MULTIPLE VITAMIN PO, Take 1 capsule by mouth daily., Disp: , Rfl:  .  rizatriptan (MAXALT-MLT) 10 MG disintegrating tablet, Take 1 tablet (10 mg total) by mouth 3 (three) times daily as needed for migraine. May repeat in  2 hours if needed, Disp: 9 tablet, Rfl: 3 .  SYMBICORT 160-4.5 MCG/ACT inhaler, TAKE 2 PUFFS BY MOUTH TWICE A DAY, Disp: 30.6 each, Rfl: 1 .  traZODone (DESYREL) 50 MG tablet, Take 0.5-1 tablets (25-50 mg total) by mouth at bedtime as needed for sleep., Disp: 30 tablet, Rfl: 3 .  triamcinolone (NASACORT) 55 MCG/ACT AERO nasal inhaler, Place 2 sprays into the nose daily., Disp: 1 Inhaler, Rfl: 1 .  vitamin B-12 (CYANOCOBALAMIN) 500 MCG tablet, Take 500 mcg by mouth daily., Disp: , Rfl:    Allergies  Allergen Reactions  . Aspirin Anaphylaxis    Pt reports being able to take ibuprofen with no problems.  . Citrus Hives and Swelling  . Codeine Other (See Comments)    halucinations  . Eggs Or Egg-Derived Products Other (See Comments)    Patient states ok with flu shots, etc. No anaphalaxis.  Eggs give stomach cramps with diarrhea. After diarrhea ok.-pt eats baked goods that contains eggs with no issues   . Erythromycin Rash  . Lactose Intolerance (Gi) Diarrhea    Diarrhea with lactose.  Marland Kitchen Penicillins Rash  . Sulfonamide Derivatives Rash    Childhood reaction, has taken recently with only stomach irritation.    Objective:   There were no vitals taken for this visit.  Patient is well-developed, well-nourished in no acute distress.  Resting comfortably at home.  Head is normocephalic, atraumatic.  No labored breathing.  Speech is clear and coherent with logical content.  Patient is alert and oriented at baseline.   Assessment and Plan:   1. Suspected COVID-19 virus infection 2. Mild persistent asthma with exacerbation Patient awaiting results of PCR test.  She is to quarantine until results are in.  We will set her up for [facility] home monitoring as COVID is suspected.  She is to continue vitamin D, vitamin C and zinc.  OTC medications reviewed with patient.  She is to continue her chronic respiratory medications.  We will add on a 5-day burst of 40 mg prednisone daily.  We will also add on azithromycin due to concern for developing lower respiratory infection.  Strict ER precautions discussed with patient who voiced understanding and agreement with plan.  Leeanne Rio, PA-C 05/18/2020

## 2020-05-20 ENCOUNTER — Telehealth: Payer: Self-pay

## 2020-05-20 ENCOUNTER — Encounter (INDEPENDENT_AMBULATORY_CARE_PROVIDER_SITE_OTHER): Payer: Self-pay

## 2020-05-20 NOTE — Telephone Encounter (Signed)
Patient states that her fever was 101.9 and that she had taking some Advil. Patient is also been treated for pneumonia by pcp. Patient was advised on symptoms as follows:   Temperature is the same and is noted to be less than 100.4: Continue to monitor at home.   Advise patient to stay hydrated, push oral fluids if able, and advise pt. to avoid using multiple blankets and layer of clothing to prevent overheating.   Avoid over use of anti-pyretic medications for fevers less than 101 degrees Fahrenheit.   Fever helps fight infection.   Worsening temperature, treat if temperature is > 101: treat with OTC anti-fever medications (Tylenol and/or Ibuprofen)   May alternate Tylenol and Ibuprofen every 3 hours as needed for fever greater than 101. Example: Tylenol at 9AM, Ibuprofen at 12PM, Tylenol at 3PM, Ibuprofen at 6PM, Tylenol at 9PM, Ibuprofen at 12AM, Tylenol at 3AM, Ibuprofen at 6AM. Then restart rotation with Tylenol at 9AM.   If fever remains for greater than 3 days, notify PCP.   If fever becomes greater than 103 and unable to reduce with over the counter medication, contact PCP.

## 2020-05-21 ENCOUNTER — Telehealth: Payer: Self-pay

## 2020-05-21 ENCOUNTER — Encounter (INDEPENDENT_AMBULATORY_CARE_PROVIDER_SITE_OTHER): Payer: Self-pay

## 2020-05-21 ENCOUNTER — Other Ambulatory Visit: Payer: Self-pay | Admitting: Family Medicine

## 2020-05-21 NOTE — Telephone Encounter (Signed)
Temp max 100.4. body aches, alternating acetaminophen and ibuprofen . Temperature is the same and is noted to be less than 100.4:  Continue to monitor at home.  . Advise patient to stay hydrated, push oral fluids if able, and advise pt. to avoid using multiple blankets and layer of clothing to prevent overheating.  . Avoid over use of anti-pyretic medications for fevers less than 101 degrees Fahrenheit. . Fever helps fight infection. . Worsening temperature, treat if temperature is > 101:  treat with OTC anti-fever medications (Tylenol and/or Ibuprofen)  . May alternate Tylenol and Ibuprofen every 3 hours as needed for fever greater than 101. Example: Tylenol at 9AM, Ibuprofen at 12PM, Tylenol at 3PM, Ibuprofen at 6PM, Tylenol at 9PM, Ibuprofen at 12AM, Tylenol at 3AM, Ibuprofen at 6AM. Then restart rotation with Tylenol at [place]. .  If fever remains for greater than 3 days, notify PCP. Marland Kitchen  If fever becomes greater than 103 and unable to reduce with over the counter medication, contact PCP.    Pt verbalized understanding.

## 2020-05-22 ENCOUNTER — Encounter (INDEPENDENT_AMBULATORY_CARE_PROVIDER_SITE_OTHER): Payer: Self-pay

## 2020-05-22 ENCOUNTER — Telehealth: Payer: Self-pay

## 2020-05-22 NOTE — Telephone Encounter (Signed)
Fatigue is worse today. Arms are weak, and tired. Can walk independently. No numbness or tingling to face, arms or legs. Currently being tx for pna. Finished abx today, 1-2 days left on steroids, on inhaler.  Pt advised:  If patient has worsening weakness with inability to stand or if patient has to hold on to something to get balance, advise patient to call 911 and seek treatment in ED.  Pt verbalized understanding.

## 2020-05-23 ENCOUNTER — Other Ambulatory Visit: Payer: Self-pay | Admitting: Family Medicine

## 2020-05-23 NOTE — Telephone Encounter (Signed)
Patient is requesting Desyrell 50mg  for a 90 day supply. Last refill 04/28/20. Last visit 05/18/20 with Elyn Aquas v/v

## 2020-05-24 ENCOUNTER — Encounter: Payer: Self-pay | Admitting: Physician Assistant

## 2020-05-24 ENCOUNTER — Telehealth: Payer: Self-pay

## 2020-05-24 ENCOUNTER — Encounter (INDEPENDENT_AMBULATORY_CARE_PROVIDER_SITE_OTHER): Payer: Self-pay

## 2020-05-24 NOTE — Telephone Encounter (Signed)
Called patient because her questionnaire indicated fever of 101.3.  She states that she has been treated for pneumonia and has not yet received her result for COVID-19.  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  She states that she was fever free yesterday but today had headache and fever 101.7 Yesterday she finished her antibiotic and prednisone.  She is alternating between tylenol and Advil.  She will reach out to her PCP for direction.  She was told to drink plenty of fluids and rest.  She verbalized understanding and will follow the plan of care.

## 2020-05-25 ENCOUNTER — Encounter: Payer: Self-pay | Admitting: Family Medicine

## 2020-05-25 DIAGNOSIS — U071 COVID-19: Secondary | ICD-10-CM

## 2020-05-25 MED ORDER — PROMETHAZINE-DM 6.25-15 MG/5ML PO SYRP: 5.0000 mL | ORAL_SOLUTION | Freq: Four times a day (QID) | ORAL | 0 refills | Status: DC | PRN

## 2020-05-26 ENCOUNTER — Telehealth: Payer: Self-pay | Admitting: *Deleted

## 2020-05-26 NOTE — Telephone Encounter (Signed)
Called patient to review symptoms of SOB and coughing reported on patient questionnaire. Patient report SOB with exertion but better at rest. Constant dry cough persists. Patient reports she will continue to use hard candy and cough drops and OTC medications. Patient reports she is not "ER" sick at this time. Patient verbalized understanding of care advise and to call back or contact PCP or go to ED if symptoms worsen.

## 2020-05-27 ENCOUNTER — Telehealth: Payer: Self-pay

## 2020-05-27 MED ORDER — CEFDINIR 300 MG PO CAPS
300.0000 mg | ORAL_CAPSULE | Freq: Two times a day (BID) | ORAL | 0 refills | Status: DC
Start: 2020-05-27 — End: 2020-06-15

## 2020-05-27 NOTE — Telephone Encounter (Signed)
Called to discuss with patient about COVID-19 symptoms and the use of one of the available treatments for those with mild to moderate Covid symptoms and at a high risk of hospitalization.  Pt appears to qualify for outpatient treatment due to co-morbid conditions and/or a member of an at-risk group in accordance with the FDA Emergency Use Authorization.    Symptom onset: 05/14/20 Vaccinated: Yes Booster? Yes Immunocompromised? No Qualifiers: Asthma  Unable to reach pt - Reached pt. Pt. Is out of 7 day window for treatment. States she had COVID 19 last year and "has been sick ever since." Given [facility] number. Marcello Moores

## 2020-05-29 ENCOUNTER — Encounter (INDEPENDENT_AMBULATORY_CARE_PROVIDER_SITE_OTHER): Payer: Self-pay

## 2020-05-29 ENCOUNTER — Telehealth: Payer: Self-pay

## 2020-05-29 NOTE — Telephone Encounter (Signed)
100.7 fever. Temp max today. Has had one 05/14/20. Alternating Tylenol (1000 mg) and 600 mg Ibuprofen. Alternating every 3 hours over a week. On 2 abx and considered a long hauler. Did not qualify for MAB. Is under a doctor's care for covid pna and OM.  Marland Kitchen Temperature is the same and is noted to be less than 100.4:  Continue to monitor at home.  . Advise patient to stay hydrated, push oral fluids if able, and advise pt. to avoid using multiple blankets and layer of clothing to prevent overheating.  . Avoid over use of anti-pyretic medications for fevers less than 101 degrees Fahrenheit. . Fever helps fight infection.  . Worsening temperature, treat if temperature is > 101:  treat with OTC anti-fever medications (Tylenol and/or Ibuprofen)  . May alternate Tylenol and Ibuprofen every 3 hours as needed for fever greater than 101. Example: Tylenol at 9AM, Ibuprofen at 12PM, Tylenol at 3PM, Ibuprofen at 6PM, Tylenol at 9PM, Ibuprofen at 12AM, Tylenol at 3AM, Ibuprofen at 6AM. Then restart rotation with Tylenol at [place]. .  If fever remains for greater than 3 days, notify PCP. Marland Kitchen  If fever becomes greater than 103 and unable to reduce with over the counter medication, contact PCP.   Pt verbalized understanding.

## 2020-05-30 ENCOUNTER — Telehealth: Payer: Self-pay

## 2020-05-30 NOTE — Telephone Encounter (Signed)
Patient called due to her reporting Worsening SOB on COVID questionnaire. She says today is the first day she's been out of the house wearing a mask since 05/14/20. She says she took her kids to school, came home to do some work, went back to pick them up. She says she thinks because she was more active is why she's more SOB. She says it's only with activity and she recovers when sitting down to rest. She says her oxygen level 30 minutes of wearing the mask is 90%. She says she's not SOB at rest, only with activity and she's used her inhaler more today. She says her coughing is worse as well. She was coughing with every few words. No other complaints. I advised she will be following up tomorrow at the [facility] and I will send this note to Dr. Birdie Riddle. Advised to go to ED if SOB is worse. Patient verbalized understanding.

## 2020-05-31 ENCOUNTER — Ambulatory Visit
Admission: RE | Admit: 2020-05-31 | Discharge: 2020-05-31 | Disposition: A | Discharge status: Home / Self Care | Payer: Self-pay | Source: Ambulatory Visit | Attending: Nurse Practitioner | Admitting: Nurse Practitioner

## 2020-05-31 ENCOUNTER — Other Ambulatory Visit: Payer: Self-pay

## 2020-05-31 ENCOUNTER — Telehealth (INDEPENDENT_AMBULATORY_CARE_PROVIDER_SITE_OTHER): Payer: BC Managed Care – PPO | Admitting: Nurse Practitioner

## 2020-05-31 DIAGNOSIS — U071 COVID-19: Secondary | ICD-10-CM

## 2020-05-31 DIAGNOSIS — R053 Chronic cough: Secondary | ICD-10-CM

## 2020-05-31 DIAGNOSIS — U099 Post covid-19 condition, unspecified: Secondary | ICD-10-CM | POA: Insufficient documentation

## 2020-05-31 MED ORDER — PREDNISONE 20 MG PO TABS: 20.0000 mg | ORAL_TABLET | Freq: Every day | ORAL | 0 refills | Status: AC

## 2020-05-31 NOTE — Patient Instructions (Addendum)
Covid 19 Cough:   Stay well hydrated  Stay active  Deep breathing exercises  May start vitamin C 2,000 mg daily, vitamin D3 2,000 IU daily, Zinc 220 mg daily, and Quercetin 500 mg twice daily  May take tylenol or fever or pain  May take mucinex twice daily  Will order chest x ray:  [facility] [address] [place], [place] [address] [phone] MON - FRI 8:00 AM - 4:00 PM - WALK IN  May start Zyrtec  May start pepcid  Will order prednisone    Follow up:  Follow up in 2 weeks or sooner if needed

## 2020-05-31 NOTE — Progress Notes (Signed)
Virtual Visit via Telephone Note  I connected with Abigail Singh on 05/31/20 at  1:00 PM EST by telephone and verified that I am speaking with the correct person using two identifiers.  Location: Patient: home Provider: remote   I discussed the limitations, risks, security and privacy concerns of performing an evaluation and management service by telephone and the availability of in person appointments. I also discussed with the patient that there may be a patient responsible charge related to this service. The patient expressed understanding and agreed to proceed.  Chief Complaint  Patient presents with  . Covid Positive    +1/15 Sx: Cough, breathing issues, low grade fevers, headaches, fatigue.   Stated she's been sick since last spring, was started on Symbicort. 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 Was given azithromycin and prednisone on 1/12 by PCP. Currently on       History of Present Illness:  Patient presents today for post COVID care clinic visit through televisit.  Patient last tested positive for Covid on 05/21/2020.  Patient complains of ongoing issues with cough, shortness of breath, low-grade fever, headaches, fatigue.  Patient states that she was sick last March and has had a chronic cough and shortness of breath since that time.  She has seen pulmonary once for this issue.  Patient has recently completed a round of prednisone and azithromycin.  She is currently on cefdinir for ear infection.  We discussed that we will try to get her appointment set up with Dr. Melvyn Novas in pulmonary for chronic cough.  Denies f/c/s, n/v/d, hemoptysis, PND, chest pain or edema.      Observations/Objective:  Vitals with BMI 01/06/2020 11/24/2019 10/14/2019  Height 5\' 4"  5\' 4"  5\' 4"   Weight 180 lbs 181 lbs 178 lbs  BMI 30.88 84.69 62.95  Systolic - 284 132  Diastolic - 78 80  Pulse 440 118 124      Assessment and Plan:  Covid 19 Cough:   Stay well  hydrated  Stay active  Deep breathing exercises  May start vitamin C 2,000 mg daily, vitamin D3 2,000 IU daily, Zinc 220 mg daily, and Quercetin 500 mg twice daily  May take tylenol or fever or pain  May take mucinex twice daily  Will order chest x ray:  [facility] [address] [place], [place] [address] [phone] MON - FRI 8:00 AM - 4:00 PM - WALK IN  May start Zyrtec  May start pepcid  Will order prednisone      Follow Up Instructions:  Follow up in 2 weeks or sooner if needed    I discussed the assessment and treatment plan with the patient. The patient was provided an opportunity to ask questions and all were answered. The patient agreed with the plan and demonstrated an understanding of the instructions.   The patient was advised to call back or seek an in-person evaluation if the symptoms worsen or if the condition fails to improve as anticipated.  I provided 22 minutes of non-face-to-face time during this encounter.   Fenton Foy, NP

## 2020-06-01 ENCOUNTER — Ambulatory Visit (INDEPENDENT_AMBULATORY_CARE_PROVIDER_SITE_OTHER): Payer: BC Managed Care – PPO | Admitting: Psychology

## 2020-06-01 ENCOUNTER — Telehealth: Payer: Self-pay | Admitting: Nurse Practitioner

## 2020-06-01 ENCOUNTER — Telehealth: Payer: Self-pay | Admitting: Internal Medicine

## 2020-06-01 ENCOUNTER — Encounter: Payer: Self-pay | Admitting: Physician Assistant

## 2020-06-01 DIAGNOSIS — F4310 Post-traumatic stress disorder, unspecified: Secondary | ICD-10-CM | POA: Diagnosis not present

## 2020-06-01 NOTE — Telephone Encounter (Signed)
Left voicemail with results, advised to call back with any questions or concerns.

## 2020-06-01 NOTE — Telephone Encounter (Signed)
Attempted to call pt but unable to reach. Left pt a detailed message for her to return call.  When pt returns call, please schedule pt a 69min New Pt OV with Dr. Melvyn Novas 21 days from pt's onset of acute symptoms prior to her testing positive for covid.

## 2020-06-01 NOTE — Telephone Encounter (Signed)
MW---see message in this note.   TN would like the pt to be seen by you for her chronic cough.  She has been seen in the post covid clinic.  Do you want [place] to set this up as a tele visit?  Please advise. Thanks

## 2020-06-01 NOTE — Telephone Encounter (Signed)
Fine with me  - will need 30 min slot with all active meds in hand, can't do as televisit - needs to be 21 days since the onset of  The acute flare of the chronic symptoms, not the last day the test was positive

## 2020-06-01 NOTE — Telephone Encounter (Signed)
-----   Message from Fenton Foy, NP sent at 06/01/2020  1:19 PM EST ----- Please call to let patient know that her chest xray was clear.

## 2020-06-02 NOTE — Telephone Encounter (Signed)
ATC pt x 2, line disconnected both times.   Pt scheduled with Dr. Melvyn Novas on 2.3.22. Will need to confirm with pt that it will be 21 days after her symptoms started by the time her appt comes.

## 2020-06-03 ENCOUNTER — Other Ambulatory Visit: Payer: Self-pay | Admitting: Physician Assistant

## 2020-06-03 ENCOUNTER — Encounter: Payer: Self-pay | Admitting: Family Medicine

## 2020-06-03 MED ORDER — BENZONATATE 200 MG PO CAPS
200.0000 mg | ORAL_CAPSULE | Freq: Two times a day (BID) | ORAL | 0 refills | Status: DC | PRN
Start: 2020-06-03 — End: 2020-06-09

## 2020-06-07 NOTE — Telephone Encounter (Signed)
Spoke with Renato Battles, who scheduled the appt, and pt's s/s started more than 21 days prior to pt's upcoming scheduled appt. Nothing further needed.

## 2020-06-09 ENCOUNTER — Ambulatory Visit (INDEPENDENT_AMBULATORY_CARE_PROVIDER_SITE_OTHER): Payer: Self-pay | Admitting: Internal Medicine

## 2020-06-09 ENCOUNTER — Encounter: Payer: Self-pay | Admitting: Internal Medicine

## 2020-06-09 ENCOUNTER — Other Ambulatory Visit: Payer: Self-pay

## 2020-06-09 DIAGNOSIS — H6982 Other specified disorders of Eustachian tube, left ear: Secondary | ICD-10-CM

## 2020-06-09 DIAGNOSIS — R053 Chronic cough: Secondary | ICD-10-CM

## 2020-06-09 MED ORDER — PREDNISONE 10 MG PO TABS: ORAL_TABLET | ORAL | 0 refills | Status: DC

## 2020-06-09 MED ORDER — TRAMADOL HCL 50 MG PO TABS: 50.0000 mg | ORAL_TABLET | ORAL | 0 refills | Status: AC | PRN

## 2020-06-09 MED ORDER — PANTOPRAZOLE SODIUM 40 MG PO TBEC: 40.0000 mg | DELAYED_RELEASE_TABLET | Freq: Every day | ORAL | 2 refills | Status: DC

## 2020-06-09 NOTE — Patient Instructions (Addendum)
The key to effective treatment for your cough is eliminating the non-stop cycle of cough you're stuck in long enough to let your airway heal completely and then see if there is anything still making you cough once you stop the cough suppression, but this should take no more than 5 days to figure out  First take Phenergan DM  every 4 hours and supplement if needed with  tramadol 50 mg up to 2 every 4 hours to suppress the urge to cough at all or even clear your throat. Swallowing water or using ice chips/non mint and menthol containing candies (such as lifesavers or sugarless jolly ranchers) are also effective.  You should rest your voice and avoid activities that you know make you cough.  Once you have eliminated the cough for 3 straight days try reducing the tramadol first,  then the delsym as tolerated.    Prednisone 10 mg take  4 each am x 2 days,   2 each am x 2 days,  1 each am x 2 days and stop (this is to eliminate allergies and inflammation from coughing)  Protonix (pantoprazole) Take 30-60 min before first meal of the day and Pepcid 20 mg one bedtime plus chlorpheniramine 4 mg x 2 at bedtime (both available over the counter)  until cough is completely gone for at least a week without the need for cough suppression  GERD (REFLUX)  is an extremely common cause of respiratory symptoms, many times with no significant heartburn at all.    It can be treated with medication, but also with lifestyle changes including avoidance of late meals, excessive alcohol, smoking cessation, and avoid fatty foods, chocolate, peppermint, colas, red wine, and acidic juices such as orange juice.  NO MINT OR MENTHOL PRODUCTS SO NO COUGH DROPS   USE HARD CANDY INSTEAD (jolley ranchers or Stover's or Lifesavers (all available in sugarless versions) NO OIL BASED VITAMINS - use powdered substitutes.  Only use your albuterol as a rescue medication to be used if you can't catch your breath by resting or doing a relaxed  purse lip breathing pattern.  - The less you use it, the better it will work when you need it. - Ok to use up to 2 puffs  every 4 hours if you must but call for immediate appointment if use goes up over your usual need - Don't leave home without it !!  (think of it like the spare tire for your car)    Please schedule a follow up office visit in 2 weeks, sooner if needed  with all medications /inhalers/ solutions in hand so we can verify exactly what you are taking. This includes all medications from all doctors and over the counters

## 2020-06-09 NOTE — Assessment & Plan Note (Addendum)
Onset p viral uri p spring break trip 2021 - s/p covid mid jan 2022  - cyclical cough syndrome protocol 06/09/2020 >>>   Upper airway cough syndrome (previously labeled PNDS),  is so named because it's frequently impossible to sort out how much is  CR/sinusitis with freq throat clearing (which can be related to primary GERD)   vs  causing  secondary (" extra esophageal")  GERD from wide swings in gastric pressure that occur with throat clearing, often  promoting self use of mint and menthol lozenges that reduce the lower esophageal sphincter tone and exacerbate the problem further in a cyclical fashion.   These are the same pts (now being labeled as having "irritable larynx syndrome" by some cough centers) who not infrequently have a history of having failed to tolerate ace inhibitors,  dry powder inhalers or biphosphonates or report having atypical/extraesophageal reflux symptoms that don't respond to standard doses of PPI  and are easily confused as having aecopd or asthma flares by even experienced allergists/ pulmonologists (myself included).    Of the three most common causes of  Sub-acute / recurrent or chronic cough, only one (GERD)  can actually contribute to/ trigger  the other two (asthma and post nasal drip syndrome)  and perpetuate the cylce of cough.  While not intuitively obvious, many patients with chronic low grade reflux do not cough until there is a primary insult that disturbs the protective epithelial barrier and exposes sensitive nerve endings.   This is typically viral but can due to PNDS and  either may apply here.   The point is that once this occurs, it is difficult to eliminate the cycle  using anything but a maximally effective acid suppression regimen at least in the short run, accompanied by an appropriate diet to address non acid GERD and control / eliminate the cough itself for at least 3 days with tramadol and >>> also so added 6 day taper off  Prednisone starting at 40 mg  per day in case of component of Th-2 driven upper or lower airways inflammation (if cough responds short term only to relapse before return while will on full rx for uacs (as above), then  that would point to allergic rhinitis/ asthma or eos bronchitis as alternative dx)   >>> f/u 2 weeks with all meds in hand using a trust but verify approach to confirm accurate Medication  Reconciliation The principal here is that until we are certain that the  patients are doing what we've asked, it makes no sense to ask them to do more.          Each maintenance medication was reviewed in detail including emphasizing most importantly the difference between maintenance and prns and under what circumstances the prns are to be triggered using an action plan format where appropriate.  Total time for H and P, chart review, counseling, and generating customized AVS unique to this office visit / same day charting =45 min

## 2020-06-09 NOTE — Progress Notes (Signed)
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  with lifelong pattern of flares of rhinitis around sept and easter complicated by sinus infections req  abx /antihistamines eval in grade school by allergist in [place] dx as dog/cat  With acute onset HA / fever/then prod cough p spring break 2021 placed on symb /alb /pred some better eval by [facility] 09/28/19 with nl pfts but over the summer 2021 noted  still lots of cough to point of couldn't talk then May 21 2020 covid/pna  rx pred/zpak but cough never resolved so referred to pulmonary clinic 06/09/2020 by [name]  NP      History of Present Illness  06/09/2020  Pulmonary/ 1st office eval/Jeremia Groot  - cat free x 2 years/ was in bedroom / dog goes in [facility] Chief Complaint  Patient presents with  . Follow-up    Patient has had cough for the last 10 months states that sometimes it is dry and sometimes productive. Had covid last month. On 1/25 [name] added Zyrtec and Pepcid  Dyspnea:  By evening sob with cough  Cough: dry now and wakes up 2-3 x  Sleep: sometimes in sofa  SABA use: 3 x a day at home, 6-8 x per days    No obvious day to day or daytime variability or assoc excess/ purulent sputum or mucus plugs or hemoptysis or cp or chest tightness, subjective wheeze or overt sinus or hb symptoms.   Sleeping without nocturnal  or early am exacerbation  of respiratory  c/o's or need for noct saba. Also denies any obvious fluctuation of symptoms with weather or environmental changes or other aggravating or alleviating factors except as outlined above   No unusual exposure hx or h/o childhood pna/ asthma or knowledge of premature birth.  Current Allergies, Complete Past Medical History, Past Surgical History, Family History, and Social History were reviewed in [employer] record.  ROS  The following are not active complaints unless bolded Hoarseness,  sore throat, dysphagia, dental problems, itching, sneezing,  nasal congestion or discharge of excess mucus or purulent secretions, ear ache,   fever, chills, sweats, unintended wt loss or wt gain, classically pleuritic or exertional cp,  orthopnea pnd or arm/hand swelling  or leg swelling, presyncope, palpitations, abdominal pain, anorexia, nausea, vomiting, diarrhea  or change in bowel habits or change in bladder habits, change in stools or change in urine, dysuria, hematuria,  rash, arthralgias, visual complaints, headache, numbness, weakness or ataxia or problems with walking or coordination,  change in mood or  memory.           Past Medical History:  Diagnosis Date  . Allergy    sseasonal  . Anemia    as teenager  . Anxiety   . Depression   . Fibroid   . Fibromyalgia   . Heartburn in pregnancy   . History of fibromyalgia   . Migraines   . Neuromuscular disorder (HCC)    fibromyalgia  . Postpartum care following cesarean delivery 03/17/2012  . Preterm labor   . Status post cesarean delivery (repeat x 2) 03/17/2012  . Urinary tract infection     Outpatient Medications Prior to Visit  Medication Sig Dispense Refill  . acetaminophen (TYLENOL) 325 MG tablet Take 650 mg by mouth every 6 (six) hours as needed.    Marland Kitchen albuterol (VENTOLIN HFA) 108 (90 Base) MCG/ACT inhaler  TAKE 2 PUFFS BY MOUTH EVERY 6 HOURS AS NEEDED FOR WHEEZE OR SHORTNESS OF BREATH 1 each 3  . amphetamine-dextroamphetamine (ADDERALL XR) 20 MG 24 hr capsule Take 1 capsule (20 mg total) by mouth every morning. 30 capsule 0  . BELSOMRA 10 MG TABS TAKE 1 TABLET BY MOUTH AT BEDTIME 30 tablet 3  . Calcium Carbonate-Vit D-Min (CALCIUM 1200 PO) Take 1 capsule by mouth daily. Vitamin C as well    . cefdinir (OMNICEF) 300 MG capsule Take 1 capsule (300 mg total) by mouth 2 (two) times daily. 20 capsule 0  . cetirizine (ZYRTEC) 10 MG tablet Take 10 mg by mouth daily.    Marland Kitchen EPINEPHrine 0.3 mg/0.3 mL IJ SOAJ injection Inject 0.3 mLs  (0.3 mg total) into the muscle as needed for anaphylaxis. 1 Device 1  . famotidine (PEPCID) 20 MG tablet Take 20 mg by mouth 2 (two) times daily.    Marland Kitchen gabapentin (NEURONTIN) 100 MG capsule Take 100 mg by mouth 3 (three) times daily.    . medroxyPROGESTERone (DEPO-PROVERA) 150 MG/ML injection 1 (ONE) MILLILITER(S) EVERY 11 12 WEEKS    . MULTIPLE VITAMIN PO Take 1 capsule by mouth daily.    . promethazine-dextromethorphan (PROMETHAZINE-DM) 6.25-15 MG/5ML syrup Take 5 mLs by mouth 4 (four) times daily as needed. 240 mL 0  . rizatriptan (MAXALT-MLT) 10 MG disintegrating tablet Take 1 tablet (10 mg total) by mouth 3 (three) times daily as needed for migraine. May repeat in 2 hours if needed 9 tablet 3  . traZODone (DESYREL) 50 MG tablet TAKE 0.5-1 TABLETS BY MOUTH AT BEDTIME AS NEEDED FOR SLEEP. 90 tablet 2  . triamcinolone (NASACORT) 55 MCG/ACT AERO nasal inhaler Place 2 sprays into the nose daily. 1 Inhaler 1  . vitamin B-12 (CYANOCOBALAMIN) 500 MCG tablet Take 500 mcg by mouth daily.    . benzonatate (TESSALON) 100 MG capsule Take 1 capsule (100 mg total) by mouth 3 (three) times daily as needed for cough. 30 capsule 0  . benzonatate (TESSALON) 200 MG capsule Take 1 capsule (200 mg total) by mouth 2 (two) times daily as needed for cough. 60 capsule 0  . SYMBICORT 160-4.5 MCG/ACT inhaler TAKE 2 PUFFS BY MOUTH TWICE A DAY 30.6 each 1   No facility-administered medications prior to visit.     Objective:     BP 130/80 (BP Location: Left Arm, Patient Position: Sitting, Cuff Size: Large)   Pulse (!) 126   Temp 97.6 F (36.4 C) (Temporal)   Ht 5' 3.5" (1.613 m)   Wt 184 lb 9.6 oz (83.7 kg)   SpO2 98%   BMI 32.19 kg/m   SpO2: 98 %   amb wf with harsh honking dry sounding upper airway cough   HEENT : pt wearing mask not removed for exam due to covid -19 concerns.    NECK :  without JVD/Nodes/TM/ nl carotid upstrokes bilaterally   LUNGS: no acc muscle use,  Nl contour chest which is  clear to A and P bilaterally without cough on insp or exp maneuvers   CV:  RRR  no s3 or murmur or increase in P2, and no edema   ABD:  soft and nontender with nl inspiratory excursion in the supine position. No bruits or organomegaly appreciated, bowel sounds nl  MS:  Nl gait/ ext warm without deformities, calf tenderness, cyanosis or clubbing No obvious joint restrictions   SKIN: warm and dry without lesions    NEURO:  alert, approp, nl sensorium  with  no motor or cerebellar deficits apparent.   I personally reviewed images and agree with radiology impression as follows:  CXR:   05/31/20 Normal study     Assessment   Chronic cough Onset p viral uri p spring break trip 2021 - s/p covid mid jan 2022  - cyclical cough syndrome protocol 06/09/2020 >>>   Upper airway cough syndrome (previously labeled PNDS),  is so named because it's frequently impossible to sort out how much is  CR/sinusitis with freq throat clearing (which can be related to primary GERD)   vs  causing  secondary (" extra esophageal")  GERD from wide swings in gastric pressure that occur with throat clearing, often  promoting self use of mint and menthol lozenges that reduce the lower esophageal sphincter tone and exacerbate the problem further in a cyclical fashion.   These are the same pts (now being labeled as having "irritable larynx syndrome" by some cough centers) who not infrequently have a history of having failed to tolerate ace inhibitors,  dry powder inhalers or biphosphonates or report having atypical/extraesophageal reflux symptoms that don't respond to standard doses of PPI  and are easily confused as having aecopd or asthma flares by even experienced allergists/ pulmonologists (myself included).    Of the three most common causes of  Sub-acute / recurrent or chronic cough, only one (GERD)  can actually contribute to/ trigger  the other two (asthma and post nasal drip syndrome)  and perpetuate the cylce of  cough.  While not intuitively obvious, many patients with chronic low grade reflux do not cough until there is a primary insult that disturbs the protective epithelial barrier and exposes sensitive nerve endings.   This is typically viral but can due to PNDS and  either may apply here.   The point is that once this occurs, it is difficult to eliminate the cycle  using anything but a maximally effective acid suppression regimen at least in the short run, accompanied by an appropriate diet to address non acid GERD and control / eliminate the cough itself for at least 3 days with tramadol and >>> also so added 6 day taper off  Prednisone starting at 40 mg per day in case of component of Th-2 driven upper or lower airways inflammation (if cough responds short term only to relapse before return while will on full rx for uacs (as above), then  that would point to allergic rhinitis/ asthma or eos bronchitis as alternative dx)   >>> f/u 2 weeks with all meds in hand using a trust but verify approach to confirm accurate Medication  Reconciliation The principal here is that until we are certain that the  patients are doing what we've asked, it makes no sense to ask them to do more.           Each maintenance medication was reviewed in detail including emphasizing most importantly the difference between maintenance and prns and under what circumstances the prns are to be triggered using an action plan format where appropriate.  Total time for H and P, chart review, counseling, and generating customized AVS unique to this office visit with pt new to me/same day charting = 12min     Christinia Gully, MD 06/09/2020

## 2020-06-15 ENCOUNTER — Telehealth (INDEPENDENT_AMBULATORY_CARE_PROVIDER_SITE_OTHER): Payer: BC Managed Care – PPO | Admitting: Family Medicine

## 2020-06-15 ENCOUNTER — Encounter: Payer: Self-pay | Admitting: Family Medicine

## 2020-06-15 ENCOUNTER — Ambulatory Visit (INDEPENDENT_AMBULATORY_CARE_PROVIDER_SITE_OTHER): Payer: BC Managed Care – PPO | Admitting: Psychology

## 2020-06-15 DIAGNOSIS — F900 Attention-deficit hyperactivity disorder, predominantly inattentive type: Secondary | ICD-10-CM | POA: Diagnosis not present

## 2020-06-15 DIAGNOSIS — F4310 Post-traumatic stress disorder, unspecified: Secondary | ICD-10-CM

## 2020-06-15 DIAGNOSIS — H60502 Unspecified acute noninfective otitis externa, left ear: Secondary | ICD-10-CM

## 2020-06-15 DIAGNOSIS — F32A Depression, unspecified: Secondary | ICD-10-CM | POA: Insufficient documentation

## 2020-06-15 MED ORDER — CIPROFLOXACIN-DEXAMETHASONE 0.3-0.1 % OT SUSP
4.0000 [drp] | Freq: Two times a day (BID) | OTIC | 0 refills | Status: DC
Start: 2020-06-15 — End: 2020-06-28

## 2020-06-15 MED ORDER — AMPHETAMINE-DEXTROAMPHET ER 20 MG PO CP24: 20.0000 mg | ORAL_CAPSULE | ORAL | 0 refills | Status: DC

## 2020-06-15 MED ORDER — FLUOXETINE HCL 10 MG PO CAPS: 10.0000 mg | ORAL_CAPSULE | Freq: Every day | ORAL | 3 refills | Status: DC

## 2020-06-15 NOTE — Progress Notes (Signed)
I connected with  Abigail Singh on 06/15/20 by a video enabled telemedicine application and verified that I am speaking with the correct person using two identifiers.   I discussed the limitations of evaluation and management by telemedicine. The patient expressed understanding and agreed to proceed.

## 2020-06-15 NOTE — Progress Notes (Signed)
Virtual Visit via Video   I connected with patient on 06/15/20 at 10:00 AM EST by a video enabled telemedicine application and verified that I am speaking with the correct person using two identifiers.  Location patient: Home Location provider: [facility], Office Persons participating in the virtual visit: Patient, Provider, [place] (Sabrina M)  I discussed the limitations of evaluation and management by telemedicine and the availability of in person appointments. The patient expressed understanding and agreed to proceed.  Subjective:   HPI:   Depression- pt's PHQ score today 18.  Previously on Effexor.  Pt reports cough is improving after seeing Dr Melvyn Novas.  Pt has therapy w/ Bambi Cottle every 2 weeks and therapist encouraged medication.  Neurologist started Fluoxetine 10mg  daily back in Oct 2020 but pt never took this.    ADHD- pt needs refill on Adderall.  L ear pain- pt reports pain to touch or manipulate outer ear.  Muffled hearing.  + waxy, yellow drainage.  No fevers.  ROS:   See pertinent positives and negatives per HPI.  Patient Active Problem List   Diagnosis Date Noted  . Chronic cough 05/31/2020  . COVID-19 05/31/2020  . 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 Seasonal allergic conjunctivitis 06/24/2018  . Other allergic rhinitis 06/24/2018  . Other dysphagia 06/24/2018  . Fibromyalgia 12/05/2017  . Vitamin D deficiency 11/26/2016  . Insomnia 11/26/2016  . ADHD 11/26/2016  . Obesity (BMI 30-39.9) 01/27/2016  . Migraine 12/29/2014  . Plantar fasciitis, bilateral 11/01/2014  . Weight gain 11/01/2014  . Abdominal cramping 02/05/2014  . Sialoadenitis 10/21/2013  . Physical exam 10/23/2010  . Dysuria 10/13/2010  . FIBROMYALGIA 11/09/2009  . Thyroid nodule 10/21/2009    Social History   Tobacco Use  . Smoking status: Former Smoker    Packs/day: 1.00    Years: 11.00    Pack years: 11.00    Types: Cigarettes    Quit date:  05/07/1997    Years since quitting: 23.1  . Smokeless tobacco: Never Used  . Tobacco comment: no E cigs  Substance Use Topics  . Alcohol use: No    Alcohol/week: 0.0 standard drinks    Current Outpatient Medications:  .  acetaminophen (TYLENOL) 325 MG tablet, Take 650 mg by mouth every 6 (six) hours as needed., Disp: , Rfl:  .  albuterol (VENTOLIN HFA) 108 (90 Base) MCG/ACT inhaler, TAKE 2 PUFFS BY MOUTH EVERY 6 HOURS AS NEEDED FOR WHEEZE OR SHORTNESS OF BREATH, Disp: 1 each, Rfl: 3 .  amphetamine-dextroamphetamine (ADDERALL XR) 20 MG 24 hr capsule, Take 1 capsule (20 mg total) by mouth every morning., Disp: 30 capsule, Rfl: 0 .  Calcium Carbonate-Vit D-Min (CALCIUM 1200 PO), Take 1 capsule by mouth daily. Vitamin C as well, Disp: , Rfl:  .  EPINEPHrine 0.3 mg/0.3 mL IJ SOAJ injection, Inject 0.3 mLs (0.3 mg total) into the muscle as needed for anaphylaxis., Disp: 1 Device, Rfl: 1 .  famotidine (PEPCID) 20 MG tablet, Take 20 mg by mouth 2 (two) times daily., Disp: , Rfl:  .  gabapentin (NEURONTIN) 100 MG capsule, Take 100 mg by mouth 3 (three) times daily., Disp: , Rfl:  .  medroxyPROGESTERone (DEPO-PROVERA) 150 MG/ML injection, 1 (ONE) MILLILITER(S) EVERY 11 12 WEEKS, Disp: , Rfl:  .  MULTIPLE VITAMIN PO, Take 1 capsule by mouth daily., Disp: , Rfl:  .  pantoprazole (PROTONIX) 40 MG tablet, Take 1 tablet (40 mg total) by mouth daily. Take 30-60 min before  first meal of the day, Disp: 30 tablet, Rfl: 2 .  predniSONE (DELTASONE) 10 MG tablet, Take  4 each am x 2 days,   2 each am x 2 days,  1 each am x 2 days and stop, Disp: 14 tablet, Rfl: 0 .  promethazine-dextromethorphan (PROMETHAZINE-DM) 6.25-15 MG/5ML syrup, Take 5 mLs by mouth 4 (four) times daily as needed., Disp: 240 mL, Rfl: 0 .  rizatriptan (MAXALT-MLT) 10 MG disintegrating tablet, Take 1 tablet (10 mg total) by mouth 3 (three) times daily as needed for migraine. May repeat in 2 hours if needed, Disp: 9 tablet, Rfl: 3 .  traZODone  (DESYREL) 50 MG tablet, TAKE 0.5-1 TABLETS BY MOUTH AT BEDTIME AS NEEDED FOR SLEEP., Disp: 90 tablet, Rfl: 2 .  triamcinolone (NASACORT) 55 MCG/ACT AERO nasal inhaler, Place 2 sprays into the nose daily., Disp: 1 Inhaler, Rfl: 1 .  vitamin B-12 (CYANOCOBALAMIN) 500 MCG tablet, Take 500 mcg by mouth daily., Disp: , Rfl:  .  BELSOMRA 10 MG TABS, TAKE 1 TABLET BY MOUTH AT BEDTIME (Patient not taking: Reported on 06/15/2020), Disp: 30 tablet, Rfl: 3 .  cefdinir (OMNICEF) 300 MG capsule, Take 1 capsule (300 mg total) by mouth 2 (two) times daily. (Patient not taking: Reported on 06/15/2020), Disp: 20 capsule, Rfl: 0 .  cetirizine (ZYRTEC) 10 MG tablet, Take 10 mg by mouth daily. (Patient not taking: Reported on 06/15/2020), Disp: , Rfl:   Allergies  Allergen Reactions  . Aspirin Anaphylaxis    Pt reports being able to take ibuprofen with no problems.  . Citrus Hives and Swelling  . Codeine Other (See Comments)    halucinations  . Eggs Or Egg-Derived Products Other (See Comments)    Patient states ok with flu shots, etc. No anaphalaxis.  Eggs give stomach cramps with diarrhea. After diarrhea ok.-pt eats baked goods that contains eggs with no issues   . Erythromycin Rash  . Lactose Intolerance (Gi) Diarrhea    Diarrhea with lactose.  Marland Kitchen Penicillins Rash  . Sulfonamide Derivatives Rash    Childhood reaction, has taken recently with only stomach irritation.    Objective:   There were no vitals taken for this visit. AAOx3, NAD NCAT, EOMI No obvious CN deficits Coloring WNL Pt is able to speak clearly, coherently without shortness of breath or increased work of breathing.  + dry cough Thought process is linear.  Mood is appropriate.   Assessment and Plan:   Depression- new.  Pt has dealt w/ multiple COVID infections and lingering sxs over the last 10 months.  Her PHQ9= 18.  Her therapist mentioned the idea of medication and I am in agreement.  Neurology recommended Prozac Oct 2020 but she never  started this.  I like the selection and will send in new prescription.  Pt expressed understanding and is in agreement w/ plan.   ADHD- refill provided for Adderrall  Otitis Externa- new.  Given reported sxs will start topical Ciprodex.  Pt expressed understanding and is in agreement w/ plan.   Annye Asa, MD 06/15/2020

## 2020-06-17 ENCOUNTER — Encounter: Payer: Self-pay | Admitting: Family Medicine

## 2020-06-17 MED ORDER — TRAMADOL HCL 50 MG PO TABS: 50.0000 mg | ORAL_TABLET | ORAL | 0 refills | Status: DC | PRN

## 2020-06-17 NOTE — Addendum Note (Signed)
Addended by: Rosana Berger on: 06/17/2020 01:47 PM   Modules accepted: Orders

## 2020-06-17 NOTE — Telephone Encounter (Signed)
Patient sent email  Yes, I am taking the protonix and pepcid as directed.   I have taken the cough syrup and Tramadol every 4 hours (except when I am asleep) since Friday of last week.  This morning is the first morning I did not take the Tramadol.  I did not take it this morning because the Rx bottle said to take one pill every four hours up to five days.  I have exceeded the five days.  I will take the Tramadol now.  Please let me know if there is anything else I should be doing. Thank you, Abigail Singh   Dr. Melvyn Novas please advise if there is anything further for the patient.

## 2020-06-17 NOTE — Telephone Encounter (Signed)
The goal for the tramadol was 100% cough suppression x 3 days then just use delsym 2 tsp every 12 hours  As needed.  Will need to regroup next week if this was not effective and just use the tramadol in meantime until it runs out  - if don't have enough until ov we can do one refill.  Must come to ov though with all meds including otcs to regroup

## 2020-06-21 NOTE — Telephone Encounter (Signed)
Would just do the delsym then until ov

## 2020-06-21 NOTE — Telephone Encounter (Signed)
Please advise  I didn't know I wasn't supposed to take both. I've been taking 10 ml of Delsym every 12 hours and 5 ml of Pro DM every 4 hours since my appointment a week and a half ago.

## 2020-06-21 NOTE — Telephone Encounter (Signed)
Please advise on message and refill  Ok. Thank you. I have an appointment this Thursday, 2/17. I will bring my medications with me.  I am almost out of Promethazine DM. May I have a refill?  I will see you Thursday.  Thank you again, Ricky Ala

## 2020-06-21 NOTE — Telephone Encounter (Signed)
Ok to refill phenergan dm or use deslym otc but don't take both

## 2020-06-23 ENCOUNTER — Ambulatory Visit (INDEPENDENT_AMBULATORY_CARE_PROVIDER_SITE_OTHER): Payer: BC Managed Care – PPO

## 2020-06-23 ENCOUNTER — Encounter: Payer: Self-pay | Admitting: *Deleted

## 2020-06-23 ENCOUNTER — Ambulatory Visit: Payer: BC Managed Care – PPO | Admitting: Internal Medicine

## 2020-06-23 ENCOUNTER — Encounter: Payer: Self-pay | Admitting: Internal Medicine

## 2020-06-23 ENCOUNTER — Other Ambulatory Visit: Payer: Self-pay

## 2020-06-23 DIAGNOSIS — R053 Chronic cough: Secondary | ICD-10-CM

## 2020-06-23 DIAGNOSIS — U071 COVID-19: Secondary | ICD-10-CM

## 2020-06-23 LAB — CBC WITH DIFFERENTIAL/PLATELET
Basophils Absolute: 0 10*3/uL (ref 0.0–0.1)
Basophils Relative: 0.4 % (ref 0.0–3.0)
Eosinophils Absolute: 0.1 10*3/uL (ref 0.0–0.7)
Eosinophils Relative: 1.1 % (ref 0.0–5.0)
HCT: 40.9 % (ref 36.0–46.0)
Hemoglobin: 13.6 g/dL (ref 12.0–15.0)
Lymphocytes Relative: 18.9 % (ref 12.0–46.0)
Lymphs Abs: 2.1 10*3/uL (ref 0.7–4.0)
MCHC: 33.4 g/dL (ref 30.0–36.0)
MCV: 84.7 fl (ref 78.0–100.0)
Monocytes Absolute: 0.8 10*3/uL (ref 0.1–1.0)
Monocytes Relative: 7.3 % (ref 3.0–12.0)
Neutro Abs: 8.1 10*3/uL — ABNORMAL HIGH (ref 1.4–7.7)
Neutrophils Relative %: 72.3 % (ref 43.0–77.0)
Platelets: 294 10*3/uL (ref 150.0–400.0)
RBC: 4.83 Mil/uL (ref 3.87–5.11)
RDW: 14.2 % (ref 11.5–15.5)
WBC: 11.2 10*3/uL — ABNORMAL HIGH (ref 4.0–10.5)

## 2020-06-23 LAB — SEDIMENTATION RATE: Sed Rate: 27 mm/hr — ABNORMAL HIGH (ref 0–20)

## 2020-06-23 MED ORDER — PREDNISONE 10 MG PO TABS: ORAL_TABLET | ORAL | 0 refills | Status: DC

## 2020-06-23 MED ORDER — TRAMADOL HCL 50 MG PO TABS: 50.0000 mg | ORAL_TABLET | ORAL | 0 refills | Status: DC | PRN

## 2020-06-23 MED ORDER — PROMETHAZINE-DM 6.25-15 MG/5ML PO SYRP: 5.0000 mL | ORAL_SOLUTION | Freq: Four times a day (QID) | ORAL | 0 refills | Status: DC | PRN

## 2020-06-23 MED ORDER — GABAPENTIN 300 MG PO CAPS: 300.0000 mg | ORAL_CAPSULE | Freq: Three times a day (TID) | ORAL | 2 refills | Status: DC

## 2020-06-23 NOTE — Assessment & Plan Note (Signed)
Onset p viral uri p spring break trip 2021 - s/p covid mid jan 2022  - cyclical cough syndrome protocol 06/09/2020 >>> improved "only while on pred"  - Allergy profile 06/23/2020 >  Eos 0.1 /  IgE    Worse again with classic cyclical cough while on gabapentin 100 mg tid  Of the three most common causes of  Sub-acute / recurrent or chronic cough, only one (GERD)  can actually contribute to/ trigger  the other two (asthma and post nasal drip syndrome)  and perpetuate the cylce of cough.  While not intuitively obvious, many patients with chronic low grade reflux do not cough until there is a primary insult that disturbs the protective epithelial barrier and exposes sensitive nerve endings.   This is typically viral but can due to PNDS and  either may apply here.   The point is that once this occurs, it is difficult to eliminate the cycle  using anything but a maximally effective acid suppression regimen at least in the short run, accompanied by an appropriate diet to address non acid GERD and control pnds with 1st gen H1 blockers per guidelines   / eliminate the cough itself for at least 3 days with tramadol and block the cough longterm with gabapentin titrated up to 300 mg tid >>> also so added 6 day taper off  Prednisone starting at 40 mg per day in case of component of Th-2 driven upper or lower airways inflammation (if cough responds short term only to relapse before return while will on full rx for uacs (as above), then  that would point to allergic rhinitis/ asthma or eos bronchitis as alternative dx)

## 2020-06-23 NOTE — Progress Notes (Signed)
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 of flares of rhinitis around sept and easter complicated by sinus infections req  abx /antihistamines eval in grade school by allergist in [place] dx as dog/cat  With acute onset HA / fever/then prod cough p spring break 2021 placed on symb /alb /pred some better eval by Abigail Singh  09/28/19 with nl pfts but over the summer 2021 noted  still lots of cough to point of couldn't talk then May 21 2020 covid/pna  rx pred/zpak but cough never resolved so referred to pulmonary clinic 06/09/2020 by Abigail Ege  NP   ENT was summer 2021   History of Present Illness  06/09/2020  Pulmonary/ 1st office eval/Abigail Singh  - cat free x 2 years/ was in bedroom / dog goes in [place]  Patient presents with  . Follow-up    Patient has had cough for the last 10 months states that sometimes it is dry and sometimes productive. Had covid last month. On 1/25 Abigail Singh added Zyrtec and Pepcid  Dyspnea:  By evening sob with cough  Cough: dry now and wakes up 2-3 x  Sleep: sometimes in sofa  SABA use: 3 x a day at home, 6-8 x per days  rec First take Phenergan DM  every 4 hours and supplement if needed with  tramadol 50 mg up to 2 every 4 hours to suppress the urge to cough at all or even clear your throat.  Once you have eliminated the cough for 3 straight days try reducing the tramadol first,  then the delsym as tolerated.   Prednisone 10 mg take  4 each am x 2 days,   2 each am x 2 days,  1 each am x 2 days and stop (this is to eliminate allergies and inflammation from coughing) Protonix (pantoprazole) Take 30-60 min before first meal of the day and Pepcid 20 mg one bedtime plus chlorpheniramine 4 mg x 2 at bedtime (both available over the counter)  until cough is completely gone for at least a week without the need for cough suppression GERD diet  Please schedule a follow  up office visit in 2 weeks   06/23/2020  f/u ov/Abigail Singh re: cough x spring  2021 / not able to follow tramadol protocol while on prednisone Chief Complaint  Patient presents with  . Follow-up    Cough improved while on pred and is now getting worse again. She has had low grade fever off and on. She has not used her albuterol.   Dyspnea:  Fine if not coughing but sats running ? In 80's ?  Cough: dry hack  Sleeping: cough  does disturb sleep  SABA use: no  02: none  Covid status:    vax x 3    No obvious day to day or daytime variability or assoc excess/ purulent sputum or mucus plugs or hemoptysis or cp or chest tightness, subjective wheeze or overt sinus or hb symptoms.   Sleeping  without nocturnal  or early am exacerbation  of respiratory  c/o's or need for noct saba. Also denies any obvious fluctuation of symptoms with weather or environmental changes or other aggravating or alleviating factors except as outlined above   No unusual exposure hx or h/o childhood pna/ asthma or knowledge of premature birth.  Current Allergies, Complete Past Medical History, Past Surgical History,  Family History, and Social History were reviewed in [facility] record.  ROS  The following are not active complaints unless bolded Hoarseness, sore throat, dysphagia, dental problems, itching, sneezing,  nasal congestion or discharge of excess mucus or purulent secretions, ear ache,   fever, chills, sweats, unintended wt loss or wt gain, classically pleuritic or exertional cp,  orthopnea pnd or arm/hand swelling  or leg swelling, presyncope, palpitations, abdominal pain, anorexia, nausea, vomiting, diarrhea  or change in bowel habits or change in bladder habits, change in stools or change in urine, dysuria, hematuria,  rash, arthralgias, visual complaints, headache, numbness, weakness or ataxia or problems with walking or coordination,  change in mood or  memory.        Current Meds   Medication Sig  . acetaminophen (TYLENOL) 325 MG tablet Take 650 mg by mouth every 6 (six) hours as needed.  Marland Kitchen albuterol (VENTOLIN HFA) 108 (90 Base) MCG/ACT inhaler TAKE 2 PUFFS BY MOUTH EVERY 6 HOURS AS NEEDED FOR WHEEZE OR SHORTNESS OF BREATH  . amphetamine-dextroamphetamine (ADDERALL XR) 20 MG 24 hr capsule Take 1 capsule (20 mg total) by mouth every morning.  . Calcium Carbonate-Vit D-Min (CALCIUM 1200 PO) Take 1 capsule by mouth daily. Vitamin C as well  . chlorpheniramine (CHLOR-TRIMETON) 4 MG tablet 1 AT BEDTIME  . ciprofloxacin-dexamethasone (CIPRODEX) OTIC suspension Place 4 drops into the left ear 2 (two) times daily.  Marland Kitchen dextromethorphan (DELSYM) 30 MG/5ML liquid 5 ML EVERY 12 HOURS AS NEEDED  . EPINEPHrine 0.3 mg/0.3 mL IJ SOAJ injection Inject 0.3 mLs (0.3 mg total) into the muscle as needed for anaphylaxis.  . famotidine (PEPCID) 20 MG tablet Take 20 mg by mouth 2 (two) times daily.  Marland Kitchen FLUoxetine (PROZAC) 10 MG capsule Take 1 capsule (10 mg total) by mouth daily.  Marland Kitchen gabapentin (NEURONTIN) 100 MG capsule Take 100 mg by mouth 3 (three) times daily.  . medroxyPROGESTERone (DEPO-PROVERA) 150 MG/ML injection 1 (ONE) MILLILITER(S) EVERY 11 12 WEEKS  . MULTIPLE VITAMIN PO Take 1 capsule by mouth daily.  . pantoprazole (PROTONIX) 40 MG tablet Take 1 tablet (40 mg total) by mouth daily. Take 30-60 min before first meal of the day  . promethazine-dextromethorphan (PROMETHAZINE-DM) 6.25-15 MG/5ML syrup Take 5 mLs by mouth 4 (four) times daily as needed.  . rizatriptan (MAXALT-MLT) 10 MG disintegrating tablet Take 1 tablet (10 mg total) by mouth 3 (three) times daily as needed for migraine. May repeat in 2 hours if needed  . traMADol (ULTRAM) 50 MG tablet Take 1 tablet (50 mg total) by mouth every 4 (four) hours as needed.  . traZODone (DESYREL) 50 MG tablet TAKE 0.5-1 TABLETS BY MOUTH AT BEDTIME AS NEEDED FOR SLEEP.  Marland Kitchen triamcinolone (NASACORT) 55 MCG/ACT AERO nasal inhaler Place 2 sprays into  the nose daily.  . vitamin B-12 (CYANOCOBALAMIN) 500 MCG tablet Take 500 mcg by mouth daily.                    Past Medical History:  Diagnosis Date  . Allergy    sseasonal  . Anemia    as teenager  . Anxiety   . Depression   . Fibroid   . Fibromyalgia   . Heartburn in pregnancy   . History of fibromyalgia   . Migraines   . Neuromuscular disorder (HCC)    fibromyalgia  . Postpartum care following cesarean delivery 03/17/2012  . Preterm labor   . Status post cesarean delivery (repeat x 2)  03/17/2012  . Urinary tract infection         Objective:       Wt Readings from Last 3 Encounters:  06/23/20 188 lb 12.8 oz (85.6 kg)  06/09/20 184 lb 9.6 oz (83.7 kg)  01/06/20 180 lb (81.6 kg)     Vital signs reviewed  06/23/2020  - Note at rest 02 sats  95% on RA   General appearance:    amb wf harsh coughing fits not related to insp /exp     HEENT : pt wearing mask not removed for exam due to covid -19 concerns.    NECK :  without JVD/Nodes/TM/ nl carotid upstrokes bilaterally   LUNGS: no acc muscle use,  Nl contour chest which is clear to A and P bilaterally without cough on insp or exp maneuvers   CV:  RRR  no s3 or murmur or increase in P2, and no edema   ABD:  soft and nontender with nl inspiratory excursion in the supine position. No bruits or organomegaly appreciated, bowel sounds nl  MS:  Nl gait/ ext warm without deformities, calf tenderness, cyanosis or clubbing No obvious joint restrictions   SKIN: warm and dry without lesions    NEURO:  alert, approp, nl sensorium with  no motor or cerebellar deficits apparent.    CXR PA and Lateral:   06/23/2020 :    I personally reviewed images and impression as follows:    Very minimal increased airway markings/ no as dz   Labs ordered/ reviewed:      Chemistry      Component Value Date/Time   NA 139 09/24/2019 1836   K 3.4 (L) 09/24/2019 1836   CL 106 09/24/2019 1836   CO2 21 (L) 09/24/2019 1836    BUN 11 09/24/2019 1836   CREATININE 0.78 09/24/2019 1836      Component Value Date/Time                                  Lab Results  Component Value Date   WBC 11.2 (H) 06/23/2020   HGB 13.6 06/23/2020   HCT 40.9 06/23/2020   MCV 84.7 06/23/2020   PLT 294.0 06/23/2020       EOS                                                              0.1                                     06/23/2020        Lab Results  Component Value Date   ESRSEDRATE 27 (H) 06/23/2020   ESRSEDRATE 9 04/10/2018        Labs ordered 06/23/2020  :  allergy profile        Assessment

## 2020-06-23 NOTE — Assessment & Plan Note (Addendum)
Dx 05/31/20  -   06/23/2020   Walked RA  2 laps @ approx 259ft each at average pace/coughing during the walk and had minimal SOB and sats remained at 100%   cxr s convincing residual and lung function looks excellent so doubt sats are really in 80s at home and advised to change monitoring technique if so.   Each maintenance medication was reviewed in detail including emphasizing most importantly the difference between maintenance and prns and under what circumstances the prns are to be triggered using an action plan format where appropriate.  Total time for H and P, chart review, counseling,  directly observing portions of ambulatory 02 saturation study/ and generating customized AVS unique to this office visit / same day charting > 30 min

## 2020-06-23 NOTE — Patient Instructions (Addendum)
Gabapentin 300 mg three times daily with meals (gradually build up )  The key to effective treatment for your cough is eliminating the non-stop cycle of cough you're stuck in long enough to let your airway heal completely and then see if there is anything still making you cough once you stop the cough suppression, but this should take no more than 5 days to figure out  First take Phenergan DM  every 4 hours and supplement if needed with  tramadol 50 mg up to 2 every 4 hours to suppress the urge to cough at all or even clear your throat. Swallowing water or using ice chips/non mint and menthol containing candies (such as lifesavers or sugarless jolly ranchers) are also effective.  You should rest your voice and avoid activities that you know make you cough.  Once you have eliminated the cough for 3 straight days try reducing the tramadol first,  then the delsym as tolerated.    Prednisone 10 mg take  4 each am x 2 days,   2 each am x 2 days,  1 each am x 2 days and stop (this is to eliminate allergies and inflammation from coughing)  Protonix (pantoprazole) Take 30-60 min before first meal of the day and Pepcid 20 mg one bedtime plus chlorpheniramine 4 mg x 2 at bedtime (both available over the counter)  And up to every 4 hours  until cough is completely gone for at least a week without the need for cough suppression  GERD (REFLUX)  is an extremely common cause of respiratory symptoms, many times with no significant heartburn at all.    It can be treated with medication, but also with lifestyle changes including avoidance of late meals, excessive alcohol, smoking cessation, and avoid fatty foods, chocolate, peppermint, colas, red wine, and acidic juices such as orange juice.  NO MINT OR MENTHOL PRODUCTS SO NO COUGH DROPS   USE HARD CANDY INSTEAD (jolley ranchers or Stover's or Lifesavers (all available in sugarless versions) NO OIL BASED VITAMINS - use powdered substitutes.  Please remember to go  to the lab and x-ray department  for your tests - we will call you with the results when they are available.      Please schedule a follow up office visit in 2  weeks, sooner if needed

## 2020-06-24 ENCOUNTER — Encounter: Payer: Self-pay | Admitting: Family Medicine

## 2020-06-27 LAB — IGE: IgE (Immunoglobulin E), Serum: 10 kU/L (ref <=114)

## 2020-06-27 MED ORDER — TRAMADOL HCL 50 MG PO TABS: 50.0000 mg | ORAL_TABLET | ORAL | 0 refills | Status: DC | PRN

## 2020-06-27 NOTE — Progress Notes (Signed)
PATIENT: Abigail Singh DOB: 01-16-1976  REASON FOR VISIT: follow up HISTORY FROM: patient  HISTORY OF PRESENT ILLNESS: Today 06/28/20  Abigail Singh is a 45 year old female with history of migraine headache and pharyngeal motor tics.  Also history of fibromyalgia, OCD, PTSD.  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 Going to the [facility] following several bouts of viral illness, and COVID/PNA Jan 2022. Initial referral to see Dr. Jannifer Franklin came in 2019 for perceived speech alteration. Prozac was ordered recently by PCP, has been on before, but waiting until finished with tramadol.   Thinks COVID in March 2021, lots of neg tests, think this was virus reactivated prior COVID infection she had. Struggling since then chronic cough. Started new plan with Dr. Melvyn Novas recently, using inhalers and prednisone, phenergan, tramadol. Seeing ENT, placed on gabapentin, thinks nerve damage to throat and vocal cords. Was in [place], then [place] hit. Think inflammation settled in throat, causing chronic cough. Ruptured both ear drums from coughing. Treated with 2 antibiotics. On reflux medication.  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 Currently migraines are well controlled, really other issues have been dominant. The speech worsens during stress and fatigue, still some trouble swallowing. Husband notes humming sound, she isn't aware of it. Her speech is nasal quality, every few words are slurred/accent quality, not waking up anymore during the night choking with all the medications. By the end of the day, she is switching her words, her children are correcting her. When in [place] it was helping. She is on strict on vocal rest from pulmonary and ENT. Her therapist wants her back on Prozac, which she plans to once she is finished with tramadol. Depression worsened back in Jan 2022. She isn't driving now due to all medications. Last 11 months, have  periods of sweats, chills, low grade fever, she had this last week.  Here today for follow-up unaccompanied.  HISTORY 10/14/2019 SS: Ms. Diesing is a 45 year old female with history of migraine headache. She also has history of underlying fibromyalgia, OCD, and posttraumatic stress disorder.  When last seen, she was taking low-dose Prozac, she was switched to Effexor working up to 75 mg daily. She stopped Prozac, never started Effexor. Around Easter, she became sick with a respiratory illness, her family developed the same, multiple test for Covid were negative. Since that time, she has had chronic cough, shortness of breath. She has seen pulmonology, felt to be some sort of post viral problem, possibly something in her larynx, scheduled to see ENT. Her headaches are currently doing well, she may have headache 1 or 2 times a week, takes Advil or Tylenol with good benefit. She never took Maxalt. She has had normal breathing test. She has pharyngeal motor tics in the evening when she is under increased stress. She is on the Depo shot, says she had bleeding for almost 6 months. Presents today for evaluation unaccompanied.   REVIEW OF SYSTEMS: Out of a complete 14 system review of symptoms, the patient complains only of the following symptoms, and all other reviewed systems are negative.  See HPI  ALLERGIES: Allergies  Allergen Reactions  . Aspirin Anaphylaxis    Pt reports being able to take ibuprofen with no problems.  . Citrus Hives and Swelling  . Codeine Other (See Comments)    halucinations  . Eggs Or Egg-Derived Products Other (See Comments)    Patient states ok with  flu shots, etc. No anaphalaxis.  Eggs give stomach cramps with diarrhea. After diarrhea ok.-pt eats baked goods that contains eggs with no issues   . Erythromycin Rash  . Lactose Intolerance (Gi) Diarrhea    Diarrhea with lactose.  Marland Kitchen Penicillins Rash  . Sulfonamide Derivatives Rash    Childhood reaction, has taken recently with  only stomach irritation.    HOME MEDICATIONS: Outpatient Medications Prior to Visit  Medication Sig Dispense Refill  . acetaminophen (TYLENOL) 325 MG tablet Take 650 mg by mouth every 6 (six) hours as needed.    Marland Kitchen albuterol (VENTOLIN HFA) 108 (90 Base) MCG/ACT inhaler TAKE 2 PUFFS BY MOUTH EVERY 6 HOURS AS NEEDED FOR WHEEZE OR SHORTNESS OF BREATH 1 each 3  . amphetamine-dextroamphetamine (ADDERALL XR) 20 MG 24 hr capsule Take 1 capsule (20 mg total) by mouth every morning. 30 capsule 0  . Calcium Carbonate-Vit D-Min (CALCIUM 1200 PO) Take 1 capsule by mouth daily. Vitamin C as well    . chlorpheniramine (CHLOR-TRIMETON) 4 MG tablet 1 AT BEDTIME    . dextromethorphan (DELSYM) 30 MG/5ML liquid 5 ML EVERY 12 HOURS AS NEEDED    . EPINEPHrine 0.3 mg/0.3 mL IJ SOAJ injection Inject 0.3 mLs (0.3 mg total) into the muscle as needed for anaphylaxis. 1 Device 1  . famotidine (PEPCID) 20 MG tablet Take 20 mg by mouth 2 (two) times daily.    Marland Kitchen gabapentin (NEURONTIN) 100 MG capsule Take 100 mg by mouth 3 (three) times daily.    Marland Kitchen gabapentin (NEURONTIN) 300 MG capsule Take 1 capsule (300 mg total) by mouth 3 (three) times daily. 90 capsule 2  . medroxyPROGESTERone (DEPO-PROVERA) 150 MG/ML injection 1 (ONE) MILLILITER(S) EVERY 11 12 WEEKS    . MULTIPLE VITAMIN PO Take 1 capsule by mouth daily.    . pantoprazole (PROTONIX) 40 MG tablet Take 1 tablet (40 mg total) by mouth daily. Take 30-60 min before first meal of the day 30 tablet 2  . predniSONE (DELTASONE) 10 MG tablet Take  4 each am x 2 days,   2 each am x 2 days,  1 each am x 2 days and stop 14 tablet 0  . promethazine-dextromethorphan (PROMETHAZINE-DM) 6.25-15 MG/5ML syrup Take 5 mLs by mouth 4 (four) times daily as needed. 240 mL 0  . rizatriptan (MAXALT-MLT) 10 MG disintegrating tablet Take 1 tablet (10 mg total) by mouth 3 (three) times daily as needed for migraine. May repeat in 2 hours if needed 9 tablet 3  . traMADol (ULTRAM) 50 MG tablet Take 1  tablet (50 mg total) by mouth every 4 (four) hours as needed. 40 tablet 0  . traZODone (DESYREL) 50 MG tablet TAKE 0.5-1 TABLETS BY MOUTH AT BEDTIME AS NEEDED FOR SLEEP. 90 tablet 2  . triamcinolone (NASACORT) 55 MCG/ACT AERO nasal inhaler Place 2 sprays into the nose daily. 1 Inhaler 1  . vitamin B-12 (CYANOCOBALAMIN) 500 MCG tablet Take 500 mcg by mouth daily.    . ciprofloxacin-dexamethasone (CIPRODEX) OTIC suspension Place 4 drops into the left ear 2 (two) times daily. 7.5 mL 0   No facility-administered medications prior to visit.    PAST MEDICAL HISTORY: Past Medical History:  Diagnosis Date  . Allergy    sseasonal  . Anemia    as teenager  . Anxiety   . Depression   . Fibroid   . Fibromyalgia   . Heartburn in pregnancy   . History of fibromyalgia   . Migraines   . Neuromuscular disorder (  HCC)    fibromyalgia  . Postpartum care following cesarean delivery 03/17/2012  . Preterm labor   . Status post cesarean delivery (repeat x 2) 03/17/2012  . Urinary tract infection     PAST SURGICAL HISTORY: Past Surgical History:  Procedure Laterality Date  . abnormal cells in colon/tatoo area     2012  . CESAREAN SECTION    . CESAREAN SECTION  03/17/2012   Procedure: CESAREAN SECTION;  Surgeon: Marvene Staff, MD;  Location: [facility];  Service: Obstetrics;  Laterality: N/A;  Repeat C/S.  EDD: 03/23/12  . COLONOSCOPY    . DILATION AND CURETTAGE OF UTERUS    . POLYPECTOMY    . tubes in ears    . WISDOM TOOTH EXTRACTION      FAMILY HISTORY: Family History  Problem Relation Age of Onset  . Diabetes Mother   . Diabetes Father   . Food Allergy Father   . Diabetes Maternal Grandmother   . Colon polyps Maternal Grandmother   . Diabetes Maternal Grandfather   . Colon polyps Maternal Grandfather   . Heart disease Maternal Grandfather   . Colon cancer Paternal Grandmother   . Heart disease Paternal Grandfather   . Heart failure Other        Grandfather  . Heart attack  Other        x's 4 Grandfather  . Stroke Other        x's 1 Grandfather  . Diabetes Other   . Diabetes Maternal Aunt   . Allergic rhinitis Daughter   . Eczema Daughter   . Urticaria Daughter   . Asthma Daughter   . Angioedema Neg Hx   . Immunodeficiency Neg Hx     SOCIAL HISTORY: Social History   Socioeconomic History  . Marital status: Married    Spouse name: Not on file  . Number of children: Not on file  . Years of education: Not on file  . Highest education level: Not on file  Occupational History  . Not on file  Tobacco Use  . Smoking status: Former Smoker    Packs/day: 1.00    Years: 11.00    Pack years: 11.00    Types: Cigarettes    Quit date: 05/07/1997    Years since quitting: 23.1  . Smokeless tobacco: Never Used  . Tobacco comment: no E cigs  Vaping Use  . Vaping Use: Never used  Substance and Sexual Activity  . Alcohol use: No    Alcohol/week: 0.0 standard drinks  . Drug use: No  . Sexual activity: Yes  Other Topics Concern  . Not on file  Social History Narrative  . Not on file   Social Determinants of Health   Financial Resource Strain: Not on file  Food Insecurity: Not on file  Transportation Needs: Not on file  Physical Activity: Not on file  Stress: Not on file  Social Connections: Not on file  Intimate Partner Violence: Not on file   PHYSICAL EXAM  Vitals:   06/28/20 0732  BP: 138/88  Weight: 193 lb (87.5 kg)  Height: 5\' 4"  (1.626 m)   Body mass index is 33.13 kg/m.  Generalized: Well developed, in no acute distress   Neurological examination  Mentation: Alert oriented to time, place, history taking. Follows all commands, speech nasal quality, every few words is slurred or has accent quality Cranial nerve II-XII: Pupils were equal round reactive to light. Extraocular movements were full, visual field were full on confrontational test.  Facial sensation and strength were normal. Head turning and shoulder shrug  were normal and  symmetric. Motor: The motor testing reveals 5 over 5 strength of all 4 extremities. Good symmetric motor tone is noted throughout.  Mild postural tremor noted to both hands. Sensory: Sensory testing is intact to soft touch on all 4 extremities. No evidence of extinction is noted.  Coordination: Cerebellar testing reveals good finger-nose-finger and heel-to-shin bilaterally.  Gait and station: Gait is normal. Tandem gait is normal. Romberg is negative. No drift is seen.  Reflexes: Deep tendon reflexes are symmetric and normal bilaterally.   DIAGNOSTIC DATA (LABS, IMAGING, TESTING) - I reviewed patient records, labs, notes, testing and imaging myself where available.  Lab Results  Component Value Date   WBC 11.2 (H) 06/23/2020   HGB 13.6 06/23/2020   HCT 40.9 06/23/2020   MCV 84.7 06/23/2020   PLT 294.0 06/23/2020      Component Value Date/Time   NA 139 09/24/2019 1836   K 3.4 (L) 09/24/2019 1836   CL 106 09/24/2019 1836   CO2 21 (L) 09/24/2019 1836   GLUCOSE 121 (H) 09/24/2019 1836   BUN 11 09/24/2019 1836   CREATININE 0.78 09/24/2019 1836   CALCIUM 9.1 09/24/2019 1836   PROT 7.6 09/24/2019 1836   ALBUMIN 4.0 09/24/2019 1836   AST 20 09/24/2019 1836   ALT 30 09/24/2019 1836   ALKPHOS 70 09/24/2019 1836   BILITOT 0.6 09/24/2019 1836   GFRNONAA >60 09/24/2019 1836   GFRAA >60 09/24/2019 1836   Lab Results  Component Value Date   CHOL 163 07/15/2019   HDL 46.50 07/15/2019   LDLCALC 100 (H) 07/15/2019   TRIG 83.0 07/15/2019   CHOLHDL 4 07/15/2019   Lab Results  Component Value Date   HGBA1C 5.6 12/05/2017   No results found for: VITAMINB12 Lab Results  Component Value Date   TSH 1.73 07/15/2019    ASSESSMENT AND PLAN 45 y.o. year old female  has a past medical history of Allergy, Anemia, Anxiety, Depression, Fibroid, Fibromyalgia, Heartburn in pregnancy, History of fibromyalgia, Migraines, Neuromuscular disorder ([place]), Postpartum care following cesarean delivery  (03/17/2012), Preterm labor, Status post cesarean delivery (repeat x 2) (03/17/2012), and Urinary tract infection. here with:  1.  Chronic cough, post COVID 2.  Speech alteration 3.  Chronic migraine headaches  -Continue following with Dr. Melvyn Novas at [facility] clinic, on medication plan to help with chronic cough -Today, note nasal quality to voice, every few words is slurred or sounds like an accent, has been present since 2019, worsens during stress/fatigue, we are following along conservatively, she is on strict vocal rest from ENT/pulmonary -Previously benefited from speech therapy, but will wait to resume once other issues improve -She plans to start Prozac when she comes off tramadol, is in counseling  -I'd like for her to return in 6 months to see Dr. Jannifer Franklin for a revisit, hopefully her chronic cough, post COVID issues will be better controlled   I spent 30 minutes of face-to-face and non-face-to-face time with patient.  This included previsit chart review, lab review, study review, order entry, electronic health record documentation, patient education.  Butler Denmark, AGNP-C, DNP 06/28/2020, 8:15 AM [facility] [address], [place], [place] [address] [phone]

## 2020-06-27 NOTE — Telephone Encounter (Signed)
Dr Annamaria Boots please advise on pt email in Dr Gustavus Bryant absence today.  Tramadol last dispensed on 06/23/20 # 40 tablets. She was advised could take up to 2 every 4 hours prn cough. See AVS. Thanks!  Dr. Melvyn Novas,  Good morning. I am sorry to bother you on the weekend. I am having my first cough free day. I will run out of my Tramadol today too. May I have a refill Rx of Tramadol?   Thank you, Abigail Singh  Gabapentin 300 mg three times daily with meals (gradually build up )  The key to effective treatment for your cough is eliminating the non-stop cycle of cough you're stuck in long enough to let your airway heal completely and then see if there is anything still making you cough once you stop the cough suppression, but this should take no more than 5 days to figure out  First take Phenergan DM  every 4 hours and supplement if needed with  tramadol 50 mg up to 2 every 4 hours to suppress the urge to cough at all or even clear your throat. Swallowing water or using ice chips/non mint and menthol containing candies (such as lifesavers or sugarless jolly ranchers) are also effective.  You should rest your voice and avoid activities that you know make you cough.  Once you have eliminated the cough for 3 straight days try reducing the tramadol first,  then the delsym as tolerated.    Prednisone 10 mg take  4 each am x 2 days,   2 each am x 2 days,  1 each am x 2 days and stop (this is to eliminate allergies and inflammation from coughing)  Protonix (pantoprazole) Take 30-60 min before first meal of the day and Pepcid 20 mg one bedtime plus chlorpheniramine 4 mg x 2 at bedtime (both available over the counter)  And up to every 4 hours  until cough is completely gone for at least a week without the need for cough suppression  GERD (REFLUX)  is an extremely common cause of respiratory symptoms, many times with no significant heartburn at all.    It can be treated with medication, but also with  lifestyle changes including avoidance of late meals, excessive alcohol, smoking cessation, and avoid fatty foods, chocolate, peppermint, colas, red wine, and acidic juices such as orange juice.  NO MINT OR MENTHOL PRODUCTS SO NO COUGH DROPS   USE HARD CANDY INSTEAD (jolley ranchers or Stover's or Lifesavers (all available in sugarless versions) NO OIL BASED VITAMINS - use powdered substitutes.  Please remember to go to the lab and x-ray department  for your tests - we will call you with the results when they are available.      Please schedule a follow up office visit in 2  weeks, sooner if needed    AVS from   Gabapentin 300 mg three times daily with meals (gradually build up )  The key to effective treatment for your cough is eliminating the non-stop cycle of cough you're stuck in long enough to let your airway heal completely and then see if there is anything still making you cough once you stop the cough suppression, but this should take no more than 5 days to figure out  First take Phenergan DM  every 4 hours and supplement if needed with  tramadol 50 mg up to 2 every 4 hours to suppress the urge to cough at all or even clear your throat. Swallowing water or using ice  chips/non mint and menthol containing candies (such as lifesavers or sugarless jolly ranchers) are also effective.  You should rest your voice and avoid activities that you know make you cough.  Once you have eliminated the cough for 3 straight days try reducing the tramadol first,  then the delsym as tolerated.    Prednisone 10 mg take  4 each am x 2 days,   2 each am x 2 days,  1 each am x 2 days and stop (this is to eliminate allergies and inflammation from coughing)  Protonix (pantoprazole) Take 30-60 min before first meal of the day and Pepcid 20 mg one bedtime plus chlorpheniramine 4 mg x 2 at bedtime (both available over the counter)  And up to every 4 hours  until cough is completely gone for at least a week  without the need for cough suppression  GERD (REFLUX)  is an extremely common cause of respiratory symptoms, many times with no significant heartburn at all.    It can be treated with medication, but also with lifestyle changes including avoidance of late meals, excessive alcohol, smoking cessation, and avoid fatty foods, chocolate, peppermint, colas, red wine, and acidic juices such as orange juice.  NO MINT OR MENTHOL PRODUCTS SO NO COUGH DROPS   USE HARD CANDY INSTEAD (jolley ranchers or Stover's or Lifesavers (all available in sugarless versions) NO OIL BASED VITAMINS - use powdered substitutes.  Please remember to go to the lab and x-ray department  for your tests - we will call you with the results when they are available.      Please schedule a follow up office visit in 2  weeks, sooner if needed

## 2020-06-27 NOTE — Telephone Encounter (Signed)
Tramadol refill sent to [facility]

## 2020-06-28 ENCOUNTER — Ambulatory Visit: Payer: BC Managed Care – PPO | Admitting: Neurology

## 2020-06-28 ENCOUNTER — Encounter: Payer: Self-pay | Admitting: Neurology

## 2020-06-28 VITALS — BP 138/88 | Ht 64.0 in | Wt 193.0 lb

## 2020-06-28 DIAGNOSIS — R053 Chronic cough: Secondary | ICD-10-CM

## 2020-06-28 DIAGNOSIS — G43009 Migraine without aura, not intractable, without status migrainosus: Secondary | ICD-10-CM | POA: Diagnosis not present

## 2020-06-28 DIAGNOSIS — R4789 Other speech disturbances: Secondary | ICD-10-CM | POA: Insufficient documentation

## 2020-06-28 NOTE — Patient Instructions (Signed)
Hang in there, continue your plan with Dr. Melvyn Novas Please call with any questions or change in your condition  See you back in 6 months

## 2020-06-28 NOTE — Progress Notes (Signed)
Spoke with pt and notified of results per Dr. Wert. Pt verbalized understanding and denied any questions. 

## 2020-06-28 NOTE — Progress Notes (Signed)
I have read the note, and I agree with the clinical assessment and plan.  Chesley Veasey K Jhalil Silvera   

## 2020-06-29 ENCOUNTER — Other Ambulatory Visit: Payer: Self-pay | Admitting: Internal Medicine

## 2020-06-29 ENCOUNTER — Ambulatory Visit: Payer: BC Managed Care – PPO | Admitting: Psychology

## 2020-06-29 NOTE — Telephone Encounter (Signed)
Called and spoke with pt letting her know that MW wants to have a televisit scheduled prior to having the cough syrup refilled and she verbalized understanding. Pt has been scheduled for televisit with MW tomorrow 2/24 at 10am. Nothing further needed.

## 2020-06-29 NOTE — Telephone Encounter (Signed)
Needs at least a televisit 1/24  Or 1/25 to sort this all out.

## 2020-06-29 NOTE — Telephone Encounter (Signed)
MW please advise.  Thanks.  

## 2020-06-30 ENCOUNTER — Other Ambulatory Visit: Payer: Self-pay

## 2020-06-30 ENCOUNTER — Ambulatory Visit (INDEPENDENT_AMBULATORY_CARE_PROVIDER_SITE_OTHER): Payer: BC Managed Care – PPO

## 2020-06-30 ENCOUNTER — Ambulatory Visit (INDEPENDENT_AMBULATORY_CARE_PROVIDER_SITE_OTHER): Payer: BC Managed Care – PPO | Admitting: Internal Medicine

## 2020-06-30 ENCOUNTER — Encounter: Payer: Self-pay | Admitting: Neurology

## 2020-06-30 DIAGNOSIS — R053 Chronic cough: Secondary | ICD-10-CM | POA: Diagnosis not present

## 2020-06-30 DIAGNOSIS — Z0279 Encounter for issue of other medical certificate: Secondary | ICD-10-CM

## 2020-06-30 NOTE — Patient Instructions (Addendum)
I will be referring you to [name] at [facility] ([facility])  No more tramadol   For drainage / throat tickle try take CHLORPHENIRAMINE  4 mg  (Chlortab 4mg   at [facility] [employer] should be easiest to find in the green box)  take one every 4 hours as needed - available over the counter- may cause drowsiness so start with just a bedtime dose or two and see how you tolerate it before trying in daytime    Go to office for cxr asap and I will call you with results. - Dg Es added

## 2020-06-30 NOTE — Telephone Encounter (Signed)
No pneumonia on cxr so hard to tell what the fever's coming from but rec   rec check covid status (nasal swab)at drugstore fine if no kit at home zpak

## 2020-06-30 NOTE — Progress Notes (Signed)
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 Had covid last month. On 1/25 [name] added Zyrtec and Pepcid  Dyspnea:  By evening sob with cough  Cough: dry now and wakes up 2-3 x  Sleep: sometimes in sofa  SABA use: 3 x a day at home, 6-8 x per days  rec First take Phenergan DM  every 4 hours and supplement if needed with  tramadol 50 mg up to 2 every 4 hours to suppress the urge to cough at all or even clear your throat.  Once you have eliminated the cough for 3 straight days try reducing the tramadol first,  then the delsym as tolerated.   Prednisone 10 mg take  4 each am x 2 days,   2 each am x 2 days,  1 each am x 2 days and stop (this is to eliminate allergies and inflammation from coughing) Protonix (pantoprazole) Take 30-60 min before first meal of the day and Pepcid 20 mg one bedtime plus chlorpheniramine 4 mg x 2 at bedtime (both available over the counter)  until cough is completely gone for at least a week without the  need for cough suppression GERD diet  Please schedule a follow up office visit in 2 weeks   06/23/2020  f/u ov/Royanne Warshaw re: cough x spring  2021 / not able to follow tramadol protocol while on prednisone Chief Complaint  Patient presents with   Follow-up    Cough improved while on pred and is now getting worse again. She has had low grade fever off and on. She has not used her albuterol.   Dyspnea:  Fine if not coughing but sats running ? In 80's ?  Cough: dry hack  Sleeping: cough  does disturb sleep  SABA use: no  02: none  Covid status:    vax x 3  rec Gabapentin 300 mg three times daily with meals (gradually build up ) First take Phenergan DM  every 4 hours and supplement if needed with  tramadol 50 mg up to 2 every 4 hours to suppress the urge to cough at all or even clear your throat. Swallowing water or using ice chips/non mint and menthol containing candies (such as lifesavers or sugarless jolly ranchers) are also effective.  You should rest your voice and avoid activities that you know make you cough. Once you have eliminated the cough  for 3 straight days try reducing the tramadol first,  then the delsym as tolerated.   Prednisone 10 mg take  4 each am x 2 days,   2 each am x 2 days,  1 each am x 2 days and stop (this is to eliminate allergies and inflammation from coughing) Protonix (pantoprazole) Take 30-60 min before first meal of the day and Pepcid 20 mg one bedtime plus chlorpheniramine 4 mg x 2 at bedtime (both available over the counter)  And up to every 4 hours  until cough is completely gone for at least a week without the need for cough suppression GERD diet   Virtual Visit via Telephone Note 06/30/2020   I connected with Abigail Singh on 06/30/20 at 8:30 AM EST by telephone and verified that I am speaking with the correct person using two identifiers. Pt is at home and this call made from my office with no other participants    I discussed the limitations, risks,  security and privacy concerns of performing an evaluation and management service by telephone and the availability of in person appointments. I also discussed with the patient that there may be a patient responsible charge related to this service. The patient expressed understanding and agreed to proceed.   History of Present Illness:  l choked on popcorn 06/28/20 and was "better" until then but still coughing every 3 hours despite max rx above with tramadol 100 mg q 12. Apparently her choking has been longstanding problem for which has seen neuro with no improvement.  Dyspnea: improved but not checking sats  when walking  Cough: dry day > noct  Sleeping: wedge under mattress  SABA use: none 02: none    No obvious day to day or daytime variability or assoc excess/ purulent sputum or mucus plugs or hemoptysis or cp or chest tightness, subjective wheeze or overt sinus or hb symptoms.    Also denies any obvious fluctuation of symptoms with weather or environmental changes or other aggravating or alleviating factors except as outlined above.   Meds reviewed/ med reconciliation completed     No outpatient medications have been marked as taking for the 06/30/20 encounter (Office Visit) with Tanda Rockers, MD.         Observations/Objective:  on phone (? [facility] affect/ / slt slurred speech)  , no spont cough, good voice texture/ no sob   CXR PA and Lateral:   06/30/2020 :    I personally reviewed images and   radiology impression as follows:    wnl    Assessment and Plan: See problem list for active a/p's   Follow Up Instructions: See avs for instructions unique to this ov which includes revised/ updated med list     I discussed the assessment and treatment plan with the patient. The patient was provided an opportunity to ask questions and all were answered. The patient agreed with the plan and demonstrated an understanding of the instructions.   The patient was advised to call back  or seek an in-person evaluation if the symptoms worsen or if the condition fails to improve as anticipated.  I provided 25  minutes of non-face-to-face time during this encounter.   Christinia Gully, MD                            Past Medical History:  Diagnosis Date   Allergy    sseasonal   Anemia  as teenager   Anxiety    Depression    Fibroid    Fibromyalgia    Heartburn in pregnancy    History of fibromyalgia    Migraines    Neuromuscular disorder ([place])    fibromyalgia   Postpartum care following cesarean delivery 03/17/2012   Preterm labor    Status post cesarean delivery (repeat x 2) 03/17/2012   Urinary tract infection

## 2020-06-30 NOTE — Telephone Encounter (Signed)
Please advise on patient mychart message  Dr. Melvyn Novas,  Thank you for your call this morning. I hope I didn't miss anything from your instructions this morning.   I picked up my Rx cough medicine.  I have an appointment with Dr. Joya Gaskins on March 23rd.  I had another chest X-ray taken at your office.  I attached the video of what my cough sounds like at the end of the day. My husband took it last night hoping it would be helpful for you to hear my cough.  My fever is back up to 101.4.  My throat is still making the weird popping noises. Is that normal?  I don't think we discussed this.  I stopped taking the Tramadol.  I have an appointment with you next Friday, March 4th. Do I keep that appointment or cancel it since you referred me to Dr. Joya Gaskins?   I think that is everything we discussed. Thank you again for your help.  Abigail Singh

## 2020-07-01 ENCOUNTER — Encounter: Payer: Self-pay | Admitting: Internal Medicine

## 2020-07-01 ENCOUNTER — Telehealth: Payer: Self-pay | Admitting: *Deleted

## 2020-07-01 NOTE — Telephone Encounter (Signed)
Before I call the pt- is this a dg esophagus or modified barium swallow you are wanting her to have? Thanks!

## 2020-07-01 NOTE — Telephone Encounter (Signed)
I have faxed the forms to [phone] put them to the attention of Vista Mink.   Pt is aware and they have been sent to scan.

## 2020-07-01 NOTE — Telephone Encounter (Signed)
Ideally needs both parts, upper and lower to complete the eval so see if can order it that way - looking for aspiration and/ or GERD

## 2020-07-01 NOTE — Telephone Encounter (Signed)
-----   Message from Tanda Rockers, MD sent at 07/01/2020  5:29 AM EST ----- Reviewed chart, needs swallowing test next as can't find where she's had full study.

## 2020-07-01 NOTE — Telephone Encounter (Signed)
Spoke with the pt and notified of response per MW  She verbalized understanding  She states that someone already called and told her this and she has test scheduled for 07/04/20  Nothing further needed

## 2020-07-01 NOTE — Assessment & Plan Note (Addendum)
Onset p viral uri p spring break trip 2021 - s/p covid mid jan 2022  - cyclical cough syndrome protocol 06/09/2020 >>> improved "only while on pred"  - Allergy profile 06/23/2020 >  Eos 0.1 /  IgE 10    Now reports temp to 101 off prednisone with worse cough but no assoc rhinitis/ pnds or sob with nl cxr and difficult to piece together a unifying dx but the cough has all the features of Upper airway cough syndrome (previously labeled PNDS),  is so named because it's frequently impossible to sort out how much is  CR/sinusitis with freq throat clearing (which can be related to primary GERD)   vs  causing  secondary (" extra esophageal")  GERD from wide swings in gastric pressure that occur with throat clearing, often  promoting self use of mint and menthol lozenges that reduce the lower esophageal sphincter tone and exacerbate the problem further in a cyclical fashion.   These are the same pts (now being labeled as having "irritable larynx syndrome" by some cough centers) who not infrequently have a history of having failed to tolerate ace inhibitors,  dry powder inhalers or biphosphonates or report having atypical/extraesophageal reflux symptoms that don't respond to standard doses of PPI  and are easily confused as having aecopd or asthma flares by even experienced allergists/ pulmonologists (myself included).   Rec: ent eval/ [facility] zpak No more prednisone for now No more tramadol for now  Needs DgEs next   May need inpt care if fever persists as unable to help her as outpt to date (coughed right thru cough protocol/Unexplained choking refractory to outpt w/u)

## 2020-07-08 ENCOUNTER — Other Ambulatory Visit: Payer: Self-pay

## 2020-07-08 ENCOUNTER — Encounter: Payer: Self-pay | Admitting: Internal Medicine

## 2020-07-08 ENCOUNTER — Encounter: Payer: Self-pay | Admitting: Family Medicine

## 2020-07-08 ENCOUNTER — Other Ambulatory Visit: Payer: Self-pay | Admitting: Internal Medicine

## 2020-07-08 ENCOUNTER — Ambulatory Visit (INDEPENDENT_AMBULATORY_CARE_PROVIDER_SITE_OTHER): Payer: BC Managed Care – PPO | Admitting: Internal Medicine

## 2020-07-08 DIAGNOSIS — R0609 Other forms of dyspnea: Secondary | ICD-10-CM

## 2020-07-08 DIAGNOSIS — R0600 Dyspnea, unspecified: Secondary | ICD-10-CM | POA: Diagnosis not present

## 2020-07-08 DIAGNOSIS — R053 Chronic cough: Secondary | ICD-10-CM | POA: Diagnosis not present

## 2020-07-08 LAB — BASIC METABOLIC PANEL
BUN: 17 mg/dL (ref 6–23)
CO2: 23 mEq/L (ref 19–32)
Calcium: 9.1 mg/dL (ref 8.4–10.5)
Chloride: 106 mEq/L (ref 96–112)
Creatinine, Ser: 0.82 mg/dL (ref 0.40–1.20)
GFR: 86.96 mL/min (ref >60.00)
Glucose, Bld: 90 mg/dL (ref 70–99)
Potassium: 4 mEq/L (ref 3.5–5.1)
Sodium: 140 mEq/L (ref 135–145)

## 2020-07-08 LAB — CBC WITH DIFFERENTIAL/PLATELET
Basophils Absolute: 0.1 10*3/uL (ref 0.0–0.1)
Basophils Relative: 0.6 % (ref 0.0–3.0)
Eosinophils Absolute: 0.1 10*3/uL (ref 0.0–0.7)
Eosinophils Relative: 1.3 % (ref 0.0–5.0)
HCT: 40.4 % (ref 36.0–46.0)
Hemoglobin: 13.5 g/dL (ref 12.0–15.0)
Lymphocytes Relative: 27.1 % (ref 12.0–46.0)
Lymphs Abs: 2.5 10*3/uL (ref 0.7–4.0)
MCHC: 33.5 g/dL (ref 30.0–36.0)
MCV: 85.2 fl (ref 78.0–100.0)
Monocytes Absolute: 0.6 10*3/uL (ref 0.1–1.0)
Monocytes Relative: 6.6 % (ref 3.0–12.0)
Neutro Abs: 6.1 10*3/uL (ref 1.4–7.7)
Neutrophils Relative %: 64.4 % (ref 43.0–77.0)
Platelets: 318 10*3/uL (ref 150.0–400.0)
RBC: 4.75 Mil/uL (ref 3.87–5.11)
RDW: 14.2 % (ref 11.5–15.5)
WBC: 9.4 10*3/uL (ref 4.0–10.5)

## 2020-07-08 LAB — SEDIMENTATION RATE: Sed Rate: 19 mm/hr (ref 0–20)

## 2020-07-08 LAB — TSH: TSH: 1.58 u[IU]/mL (ref 0.35–4.50)

## 2020-07-08 LAB — D-DIMER, QUANTITATIVE: D-Dimer, Quant: 0.23 mcg/mL FEU (ref <0.50)

## 2020-07-08 MED ORDER — PROMETHAZINE-DM 6.25-15 MG/5ML PO SYRP: 5.0000 mL | ORAL_SOLUTION | Freq: Four times a day (QID) | ORAL | 0 refills | Status: DC | PRN

## 2020-07-08 NOTE — Assessment & Plan Note (Addendum)
Onset with covid 19  05/2020 -  07/08/2020   Walked RA  3laps @ approx 246ft each @ fast pace  stopped due to end of study,  Sob the whole time but sats still 95% at end, pulse 112   No evidence asthma, anemia, thyroid disorder, CHF or PE so likely this is combination of UACS and deconditioning post covid.  Each maintenance medication was reviewed in detail including emphasizing most importantly the difference between maintenance and prns and under what circumstances the prns are to be triggered using an action plan format where appropriate.  Total time for H and P, chart review, counseling,  directly observing portions of ambulatory 02 saturation study/ and generating customized AVS unique to this office visit / same day charting = 33 min

## 2020-07-08 NOTE — Patient Instructions (Signed)
We will walk you today to see why you are so short of breath  We will see if we can get you a flutter valve today  Gabapentin 300 mg increase to four times daily   Try stopping the progesterone injections.  Keep your appts  - return is as needed

## 2020-07-08 NOTE — Progress Notes (Signed)
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 Conversion reaction?    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 Follow-up    Patient has had cough for the last 10 months states that sometimes it is dry and sometimes productive. Had covid last month. On 1/25 [name] added Zyrtec and Pepcid  Dyspnea:  By evening sob with cough  Cough: dry now and wakes up 2-3 x  Sleep: sometimes in sofa  SABA use: 3 x a day at home, 6-8 x per days  rec First take Phenergan DM  every 4 hours and supplement if needed with  tramadol 50 mg up to 2 every 4 hours to suppress the urge to cough at all or even clear your throat.  Once you have eliminated the cough for 3 straight days try reducing the tramadol first,  then the delsym as tolerated.   Prednisone 10 mg take  4 each am x 2 days,   2 each am x 2 days,  1 each am x 2 days and stop (this is to eliminate allergies and inflammation from coughing) Protonix (pantoprazole) Take 30-60 min before first meal of the day and Pepcid 20 mg one bedtime plus  chlorpheniramine 4 mg x 2 at bedtime (both available over the counter)  until cough is completely gone for at least a week without the need for cough suppression GERD diet  Please schedule a follow up office visit in 2 weeks   06/23/2020  f/u ov/Abigail Singh re: cough x spring  2021 / not able to follow tramadol protocol while on prednisone Chief Complaint  Patient presents with  . Follow-up    Cough improved while on pred and is now getting worse again. She has had low grade fever off and on. She has not used her albuterol.   Dyspnea:  Fine if not coughing but sats running ? In 80's ?  Cough: dry hack  Sleeping: cough  does disturb sleep  SABA use: no  02: none  Covid status:    vax x 3 rec Gabapentin 300 mg three times daily with meals (gradually build up ) First take Phenergan DM  every 4 hours and supplement if needed with  tramadol 50 mg up to 2 every 4 hours to suppress the urge to cough at all or even clear your throat. Swallowing water or using ice chips/non mint and menthol containing candies (such as lifesavers or sugarless jolly ranchers) are also  effective.  You should rest your voice and avoid activities that you know make you cough. Once you have eliminated the cough for 3 straight days try reducing the tramadol first,  then the delsym as tolerated.   Prednisone 10 mg take  4 each am x 2 days,   2 each am x 2 days,  1 each am x 2 days and stop (this is to eliminate allergies and inflammation from coughing) Protonix (pantoprazole) Take 30-60 min before first meal of the day and Pepcid 20 mg one bedtime plus chlorpheniramine 4 mg x 2 at bedtime (both available over the counter)  And up to every 4 hours  until cough is completely gone for at least a week without the need for cough suppression GERD  diet Please remember to go to the lab and x-ray department  for your tests - we will call you with the results when they are available. Please schedule a follow up office visit in 2  weeks,  sooner if needed      Televisit  06/30/20 I will be referring you to [name] at [facility] No more tramadol  For drainage / throat tickle try take CHLORPHENIRAMINE  4 mg  (Chlortab 4mg   at [employer] should be easiest to find in the green box)  take one every 4 hours as needed - available over the counter- may cause drowsiness so start with just a bedtime dose or two and see how you tolerate it before trying in daytime   Go to office for cxr asap and I will call you with results. - Dg Es added    07/08/2020  f/u ov/Abigail Singh re: cough /choking date back 2018 and feels back to where she was prior to covid Jan 2022  - no longer on inhalers  Chief Complaint  Patient presents with  . Follow-up    Feeling some better  Dyspnea:  Very little activity due to sob and fast hr / 3 steps 100 ft to kitchen to carry groceries 3/3 Drops off kids one hallway  Cough: worse with talking and as day goes on Sleeping: on side / bed is flat / raised on pillows  SABA use: never helped  02: none Covid status:   vax  x  3   No obvious day to day or daytime variability or assoc excess/ purulent sputum or mucus plugs or hemoptysis or cp or chest tightness, subjective wheeze or overt sinus or hb symptoms.   Sleeping  without nocturnal  or early am exacerbation  of respiratory  c/o's or need for noct saba. Also denies any obvious fluctuation of symptoms with weather or environmental changes or other aggravating or alleviating factors except as outlined above   No unusual exposure hx or h/o childhood pna/ asthma or knowledge of premature birth.  Current Allergies, Complete Past Medical History, Past Surgical History, Family History, and Social History were reviewed in [employer] record.  ROS  The following are not active complaints unless bolded Hoarseness, sore throat, dysphagia, dental problems, itching, sneezing,  nasal congestion or discharge of excess mucus or purulent secretions, ear  ache,   fever, chills, sweats, unintended wt loss or wt gain, classically pleuritic or exertional cp,  orthopnea pnd or arm/hand swelling  or leg swelling, presyncope, palpitations, abdominal pain, anorexia, nausea, vomiting, diarrhea  or change in bowel habits or change in bladder habits, change in stools or change in urine, dysuria, hematuria,  rash, arthralgias, visual complaints, headache, numbness,  weakness or ataxia or problems with walking or coordination,  change in mood or  memory.        Current Meds  Medication Sig  . acetaminophen (TYLENOL) 325 MG tablet Take 650 mg by mouth every 6 (six) hours as needed.  Marland Kitchen albuterol (VENTOLIN HFA) 108 (90 Base) MCG/ACT inhaler TAKE 2 PUFFS BY MOUTH EVERY 6 HOURS AS NEEDED FOR WHEEZE OR SHORTNESS OF BREATH  . amphetamine-dextroamphetamine (ADDERALL XR) 20 MG 24 hr capsule Take 1 capsule (20 mg total) by mouth every morning.  . Calcium Carbonate-Vit D-Min (CALCIUM 1200 PO) Take 1 capsule by mouth daily. Vitamin C as well  . chlorpheniramine (CHLOR-TRIMETON) 4 MG tablet 1 AT BEDTIME  . dextromethorphan (DELSYM) 30 MG/5ML liquid 5 ML EVERY 12 HOURS AS NEEDED  . EPINEPHrine 0.3 mg/0.3 mL IJ SOAJ injection Inject 0.3 mLs (0.3 mg total) into the muscle as needed for anaphylaxis.  . famotidine (PEPCID) 20 MG tablet Take 20 mg by mouth 2 (two) times daily.  Marland Kitchen gabapentin (NEURONTIN) 300 MG capsule Take 1 capsule (300 mg total) by mouth 3 (three) times daily.  . medroxyPROGESTERone (DEPO-PROVERA) 150 MG/ML injection 1 (ONE) MILLILITER(S) EVERY 11 12 WEEKS  . MULTIPLE VITAMIN PO Take 1 capsule by mouth daily.  . pantoprazole (PROTONIX) 40 MG tablet Take 1 tablet (40 mg total) by mouth daily. Take 30-60 min before first meal of the day  . promethazine-dextromethorphan (PROMETHAZINE-DM) 6.25-15 MG/5ML syrup TAKE 5 MLS BY MOUTH 4 (FOUR) TIMES DAILY AS NEEDED.  . rizatriptan (MAXALT-MLT) 10 MG disintegrating tablet Take 1 tablet (10 mg total) by mouth 3 (three) times  daily as needed for migraine. May repeat in 2 hours if needed  . traZODone (DESYREL) 50 MG tablet TAKE 0.5-1 TABLETS BY MOUTH AT BEDTIME AS NEEDED FOR SLEEP.  Marland Kitchen triamcinolone (NASACORT) 55 MCG/ACT AERO nasal inhaler Place 2 sprays into the nose daily.  . vitamin B-12 (CYANOCOBALAMIN) 500 MCG tablet Take 500 mcg by mouth daily.                  Past Medical History:  Diagnosis Date  . Allergy    sseasonal  . Anemia    as teenager  . Anxiety   . Depression   . Fibroid   . Fibromyalgia   . Heartburn in pregnancy   . History of fibromyalgia   . Migraines   . Neuromuscular disorder ([facility])    fibromyalgia  . Postpartum care following cesarean delivery 03/17/2012  . Preterm labor   . Status post cesarean delivery (repeat x 2) 03/17/2012  . Urinary tract infection         Objective:        07/08/2020        196  06/23/20 188 lb 12.8 oz (85.6 kg)  06/09/20 184 lb 9.6 oz (83.7 kg)  01/06/20 180 lb (81.6 kg)     Vital signs reviewed  07/08/2020  - Note at rest 02 sats  97% on RA   General appearance:    amb wf with clear speech disorder, thick tongue texture    HEENT : pt wearing mask not removed for exam due to covid -19 concerns.    NECK :  without JVD/Nodes/TM/ nl carotid upstrokes bilaterally   LUNGS: no acc muscle use,  Nl contour chest which is clear to A and P bilaterally without cough on insp or exp maneuvers   CV:  RRR  no s3 or murmur or increase in P2, and no  edema   ABD:  soft and nontender with nl inspiratory excursion in the supine position. No bruits or organomegaly appreciated, bowel sounds nl  MS:  Nl gait/ ext warm without deformities, calf tenderness, cyanosis or clubbing No obvious joint restrictions   SKIN: warm and dry without lesions    NEURO:  alert, approp, nl sensorium with  no motor or cerebellar deficits apparent.          I personally reviewed images and agree with radiology impression as follows:  CXR:   Pa and lateral 06/30/20 wnl       Labs ordered/ reviewed:      Chemistry      Component Value Date/Time   NA 140 07/08/2020 0949   K 4.0 07/08/2020 0949   CL 106 07/08/2020 0949   CO2 23 07/08/2020 0949   BUN 17 07/08/2020 0949   CREATININE 0.82 07/08/2020 0949      Component Value Date/Time   CALCIUM 9.1 07/08/2020 0949   ALKPHOS 70 09/24/2019 1836   AST 20 09/24/2019 1836   ALT 30 09/24/2019 1836   BILITOT 0.6 09/24/2019 1836        Lab Results  Component Value Date   WBC 9.4 07/08/2020   HGB 13.5 07/08/2020   HCT 40.4 07/08/2020   MCV 85.2 07/08/2020   PLT 318.0 07/08/2020     Lab Results  Component Value Date   DDIMER 0.23 07/08/2020      Lab Results  Component Value Date   TSH 1.58 07/08/2020     No results found for: PROBNP     Lab Results  Component Value Date   ESRSEDRATE 19 07/08/2020   ESRSEDRATE 27 (H) 06/23/2020   ESRSEDRATE 9 04/10/2018              Assessment   Outpatient Encounter Medications as of 07/08/2020  Medication Sig  . acetaminophen (TYLENOL) 325 MG tablet Take 650 mg by mouth every 6 (six) hours as needed.  Marland Kitchen amphetamine-dextroamphetamine (ADDERALL XR) 20 MG 24 hr capsule Take 1 capsule (20 mg total) by mouth every morning.  . Calcium Carbonate-Vit D-Min (CALCIUM 1200 PO) Take 1 capsule by mouth daily. Vitamin C as well  . chlorpheniramine (CHLOR-TRIMETON) 4 MG tablet 1 AT BEDTIME  . EPINEPHrine 0.3 mg/0.3 mL IJ SOAJ injection Inject 0.3 mLs (0.3 mg total) into the muscle as needed for anaphylaxis.  . famotidine (PEPCID) 20 MG tablet Take 20 mg by mouth 2 (two) times daily.  Marland Kitchen gabapentin (NEURONTIN) 300 MG capsule Take 1 capsule (300 mg total) by mouth 3 (three) times daily.  .     . MULTIPLE VITAMIN PO Take 1 capsule by mouth daily.  . pantoprazole (PROTONIX) 40 MG tablet Take 1 tablet (40 mg total) by mouth daily. Take 30-60 min before first meal of the day  . rizatriptan (MAXALT-MLT) 10 MG disintegrating tablet Take 1 tablet (10 mg total) by mouth  3 (three) times daily as needed for migraine. May repeat in 2 hours if needed  . traZODone (DESYREL) 50 MG tablet TAKE 0.5-1 TABLETS BY MOUTH AT BEDTIME AS NEEDED FOR SLEEP.  Marland Kitchen triamcinolone (NASACORT) 55 MCG/ACT AERO nasal inhaler Place 2 sprays into the nose daily.  . vitamin B-12 (CYANOCOBALAMIN) 500 MCG tablet Take 500 mcg by mouth daily.  .     .    .    . promethazine-dextromethorphan (PROMETHAZINE-DM) 6.25-15 MG/5ML syrup Take 5 mLs by mouth 4 (four) times daily as needed.

## 2020-07-10 ENCOUNTER — Encounter: Payer: Self-pay | Admitting: Internal Medicine

## 2020-07-10 MED ORDER — GABAPENTIN 300 MG PO CAPS: ORAL_CAPSULE | ORAL | Status: DC

## 2020-07-10 NOTE — Assessment & Plan Note (Addendum)
Onset with cough/choking speech abnormality 2018 with   dysphagia /globus > eval [name]  - Speech therapy was March 2020 dx ? 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  - Allergy profile 06/23/2020 >  Eos 0.1 /  IgE 10   - Dg Es pending   Very challenging case of Upper airway cough syndrome (previously labeled PNDS),  is so named because it's frequently impossible to sort out how much is  CR/sinusitis with freq throat clearing (which can be related to primary GERD)   vs  causing  secondary (" extra esophageal")  GERD from wide swings in gastric pressure that occur with throat clearing, often  promoting self use of mint and menthol lozenges that reduce the lower esophageal sphincter tone and exacerbate the problem further in a cyclical fashion.   These are the same pts (now being labeled as having "irritable larynx syndrome" by some cough centers) who not infrequently have a history of having failed to tolerate ace inhibitors,  dry powder inhalers or biphosphonates or report having atypical/extraesophageal reflux symptoms that don't respond to standard doses of PPI  and are easily confused as having aecopd or asthma flares by even experienced allergists/ pulmonologists (myself included).   >>>  rec no more narcotics/ max rx for gerd/ neurogenic cough including gabapentin 300 mg qid and use of flutter valve to prevent cyclical cough pattern and eliminate Progesterone in case contributing to GERD > f/u with DgEs and Dr Joya Gaskins eval as scheduled.   Pulmonary f/u is prn

## 2020-07-11 ENCOUNTER — Encounter: Payer: Self-pay | Admitting: Family Medicine

## 2020-07-11 ENCOUNTER — Ambulatory Visit (HOSPITAL_COMMUNITY)
Admission: RE | Admit: 2020-07-11 | Discharge: 2020-07-11 | Disposition: A | Discharge status: Home / Self Care | Payer: BC Managed Care – PPO | Source: Ambulatory Visit | Attending: Internal Medicine | Admitting: Internal Medicine

## 2020-07-11 ENCOUNTER — Other Ambulatory Visit: Payer: Self-pay

## 2020-07-11 ENCOUNTER — Ambulatory Visit (INDEPENDENT_AMBULATORY_CARE_PROVIDER_SITE_OTHER): Payer: BC Managed Care – PPO | Admitting: Family Medicine

## 2020-07-11 VITALS — BP 110/70 | HR 104 | Temp 97.2°F | Resp 16 | Ht 64.0 in | Wt 196.0 lb

## 2020-07-11 DIAGNOSIS — E559 Vitamin D deficiency, unspecified: Secondary | ICD-10-CM

## 2020-07-11 DIAGNOSIS — R053 Chronic cough: Secondary | ICD-10-CM

## 2020-07-11 DIAGNOSIS — Z0000 Encounter for general adult medical examination without abnormal findings: Secondary | ICD-10-CM

## 2020-07-11 DIAGNOSIS — F900 Attention-deficit hyperactivity disorder, predominantly inattentive type: Secondary | ICD-10-CM

## 2020-07-11 DIAGNOSIS — E669 Obesity, unspecified: Secondary | ICD-10-CM | POA: Diagnosis not present

## 2020-07-11 LAB — LIPID PANEL
Cholesterol: 193 mg/dL (ref 0–200)
HDL: 42.7 mg/dL (ref >39.00)
LDL Cholesterol: 124 mg/dL — ABNORMAL HIGH (ref 0–99)
NonHDL: 150.06
Total CHOL/HDL Ratio: 5
Triglycerides: 132 mg/dL (ref 0.0–149.0)
VLDL: 26.4 mg/dL (ref 0.0–40.0)

## 2020-07-11 LAB — VITAMIN D 25 HYDROXY (VIT D DEFICIENCY, FRACTURES): VITD: 53.78 ng/mL (ref 30.00–100.00)

## 2020-07-11 LAB — HEPATIC FUNCTION PANEL
ALT: 22 U/L (ref 0–35)
AST: 20 U/L (ref 0–37)
Albumin: 4 g/dL (ref 3.5–5.2)
Alkaline Phosphatase: 82 U/L (ref 39–117)
Bilirubin, Direct: 0.1 mg/dL (ref 0.0–0.3)
Total Bilirubin: 0.3 mg/dL (ref 0.2–1.2)
Total Protein: 7.1 g/dL (ref 6.0–8.3)

## 2020-07-11 MED ORDER — AMPHETAMINE-DEXTROAMPHET ER 20 MG PO CP24: 20.0000 mg | ORAL_CAPSULE | ORAL | 0 refills | Status: DC

## 2020-07-11 NOTE — Assessment & Plan Note (Signed)
Pt has hx of this.  Check labs.  Replete prn. 

## 2020-07-11 NOTE — Assessment & Plan Note (Signed)
Following w/ Pulmonary. 

## 2020-07-11 NOTE — Progress Notes (Signed)
Spoke with pt and notified of results per Dr. Wert. Pt verbalized understanding and denied any questions. 

## 2020-07-11 NOTE — Patient Instructions (Addendum)
Follow up in 1 year or as needed We'll notify you of your lab results and make any changes if needed Continue to have your specialists send me copies of their reports Message Dr Henrene Pastor about scheduling your colonoscopy Schedule an eye exam at your convenience Call with any questions or concerns Elbert Ewings in there!!!

## 2020-07-11 NOTE — Progress Notes (Signed)
Subjective:    Patient ID: Abigail Singh, female    DOB: [birth year], 45 y.o.   MRN: [id number]  HPI CPE- UTD on pap, mammo (per report 03/2020), flu, Tdap, COVID.  Due for repeat colonoscopy  Reviewed past medical, surgical, family and social histories.   Health Maintenance  Topic Date Due  . MAMMOGRAM  03/19/2020  . Hepatitis C Screening  09/07/2020 (Originally 1975/05/27)  . COLONOSCOPY (Pts 45-79yrs Insurance coverage will need to be confirmed)  12/28/2020 (Originally 11/13/2019)  . TETANUS/TDAP  10/31/2021  . PAP SMEAR-Modifier  03/19/2022  . INFLUENZA VACCINE  Completed  . COVID-19 Vaccine  Completed  . HIV Screening  Completed  . HPV VACCINES  Aged Out    Patient Care Team    Relationship Specialty Notifications Start End  Midge Minium, MD PCP - General   04/19/10   Servando Salina, MD Consulting Physician Obstetrics and Gynecology  11/26/16   Irene Shipper, MD Consulting Physician Gastroenterology  07/11/20   Tanda Rockers, MD Consulting Physician Pulmonary Disease  07/11/20        Review of Systems Patient reports no hearing changes, adenopathy, chest pain, edema, hemoptysis, gastrointestinal bleeding (melena, rectal bleeding), abdominal pain, significant heartburn, bowel changes, GU symptoms (dysuria, hematuria, incontinence), Gyn symptoms (abnormal  bleeding, pain),  syncope, memory loss, skin/nail changes, abnormal bruising or bleeding, anxiety, or depression.   + chronic cough- following w/ Pulm + recurrent fever/chlils + hoarseness- following w/ Pulm, has swallow study pending + weight gain due to steroids + blurry vision + SOB- following w/ pulmonary + palpitations + weakness + hair loss  This visit occurred during the SARS-CoV-2 public health emergency.  Safety protocols were in place, including screening questions prior to the visit, additional usage of staff PPE, and extensive cleaning of exam room while observing appropriate contact time as  indicated for disinfecting solutions.       Objective:   Physical Exam General Appearance:    Alert, cooperative, no distress, appears stated age  Head:    Normocephalic, without obvious abnormality, atraumatic  Eyes:    PERRL, conjunctiva/corneas clear, EOM's intact, both eyes  Ears:    Normal TM's and external ear canals, both ears  Nose:   Nares normal, septum midline, mucosa normal, no drainage    or sinus tenderness  Throat:   Lips, mucosa, and tongue normal; teeth and gums normal  Neck:   Supple, symmetrical, trachea midline, no adenopathy;    Thyroid: no enlargement/tenderness/nodules  Back:     Symmetric, no curvature, ROM normal, no CVA tenderness  Lungs:     Decreased BS but clear to auscultation bilaterally, respirations unlabored.  Near continuous dry cough  Chest Wall:    No tenderness or deformity   Heart:    Regular rate and rhythm, S1 and S2 normal, no murmur, rub   or gallop  Breast Exam:    Deferred to GYN  Abdomen:     Soft, non-tender, bowel sounds active all four quadrants,    no masses, no organomegaly  Genitalia:    Deferred to GYN  Rectal:    Extremities:   Extremities normal, atraumatic, no cyanosis or edema  Pulses:   2+ and symmetric all extremities  Skin:   Skin color, texture, turgor normal, no rashes or lesions  Lymph nodes:   Cervical, supraclavicular, and axillary nodes normal  Neurologic:   CNII-XII intact, normal strength, sensation and reflexes    throughout  Assessment & Plan:

## 2020-07-11 NOTE — Assessment & Plan Note (Signed)
Pt's BMI is 33.64.  Discussed need for healthy diet and exercise as she is able.  Check labs to risk stratify.  Will follow.

## 2020-07-11 NOTE — Assessment & Plan Note (Signed)
Pt's PE WNL w/ exception of obesity and near constant cough.  UTD on pap, mammo, immunizations.  Due for repeat colonoscopy.  Check labs.  Anticipatory guidance provided.

## 2020-07-13 ENCOUNTER — Ambulatory Visit (INDEPENDENT_AMBULATORY_CARE_PROVIDER_SITE_OTHER): Payer: BC Managed Care – PPO | Admitting: Psychology

## 2020-07-13 DIAGNOSIS — F4310 Post-traumatic stress disorder, unspecified: Secondary | ICD-10-CM | POA: Diagnosis not present

## 2020-07-13 LAB — ANTI-NUCLEAR AB-TITER (ANA TITER): ANA Titer 1: 1:80 {titer} — ABNORMAL HIGH

## 2020-07-13 LAB — ANA: Anti Nuclear Antibody (ANA): POSITIVE — AB

## 2020-07-14 ENCOUNTER — Encounter: Payer: Self-pay | Admitting: Family Medicine

## 2020-07-14 ENCOUNTER — Other Ambulatory Visit: Payer: Self-pay

## 2020-07-14 DIAGNOSIS — R768 Other specified abnormal immunological findings in serum: Secondary | ICD-10-CM

## 2020-07-15 ENCOUNTER — Encounter: Payer: Self-pay | Admitting: Family Medicine

## 2020-07-18 ENCOUNTER — Telehealth: Payer: Self-pay | Admitting: Family Medicine

## 2020-07-18 NOTE — Telephone Encounter (Signed)
Form placed in bin to be filled.

## 2020-07-18 NOTE — Telephone Encounter (Signed)
..  Type of form received:Disability form  Additional comments:   Received by:  Tye Savoy should be Faxed to:  [phone]  Form should be mailed to:    Is patient requesting call for pickup:   Form placed:  In Dr. Virgil Benedict bin  Attach charge sheet.  Provider will determine charge.  Individual made aware of 3-5 business day turn around (Y/N)?

## 2020-07-19 DIAGNOSIS — Z0279 Encounter for issue of other medical certificate: Secondary | ICD-10-CM

## 2020-07-19 NOTE — Telephone Encounter (Signed)
Form completed and placed in basket  

## 2020-07-19 NOTE — Telephone Encounter (Signed)
Picked up from the back faxed to the # on the forms and made pt aware//ELEA

## 2020-07-21 ENCOUNTER — Telehealth (INDEPENDENT_AMBULATORY_CARE_PROVIDER_SITE_OTHER): Payer: BC Managed Care – PPO | Admitting: Family Medicine

## 2020-07-21 ENCOUNTER — Encounter: Payer: Self-pay | Admitting: Family Medicine

## 2020-07-21 DIAGNOSIS — B9689 Other specified bacterial agents as the cause of diseases classified elsewhere: Secondary | ICD-10-CM | POA: Diagnosis not present

## 2020-07-21 DIAGNOSIS — J4531 Mild persistent asthma with (acute) exacerbation: Secondary | ICD-10-CM | POA: Diagnosis not present

## 2020-07-21 DIAGNOSIS — J329 Chronic sinusitis, unspecified: Secondary | ICD-10-CM

## 2020-07-21 MED ORDER — DOXYCYCLINE HYCLATE 100 MG PO TABS: 100.0000 mg | ORAL_TABLET | Freq: Two times a day (BID) | ORAL | 0 refills | Status: DC

## 2020-07-21 MED ORDER — PREDNISONE 10 MG PO TABS: ORAL_TABLET | ORAL | 0 refills | Status: DC

## 2020-07-21 NOTE — Progress Notes (Signed)
Virtual Visit via Video   I connected with patient on 07/21/20 at  2:30 PM EDT by a video enabled telemedicine application and verified that I am speaking with the correct person using two identifiers.  Location patient: Home Location provider: [facility] Office Persons participating in the virtual visit: Patient, Provider, [place] (Sabrina M)  I discussed the limitations of evaluation and management by telemedicine and the availability of in person appointments. The patient expressed understanding and agreed to proceed.  Subjective:   HPI:   URI- 'i'm sick again'.  + dry, hacking cough.  Daughter got sick at [employer] and passed it to sisters and dad.  They were sick x10 days.  Tm- 102.  Pt alternating tylenol/ibuprofen.  O2 has not dropped below 92%.  HR ranges from 50s-140s.  Yesterday had 'bad asthma attack'.  'couldn't stop coughing, couldn't breathe, couldn't catch my breath'.  Went and stood in deep freezer to break the cough.  + maxillary sinus pain.  Pt has Symbicort and Albuterol at home.  Has not been using Symbicort since Pulmonary told her to stop.  ROS:   See pertinent positives and negatives per HPI.  Patient Active Problem List   Diagnosis Date Noted  . DOE (dyspnea on exertion) 07/08/2020  . Alteration in speech 06/28/2020  . Depression 06/15/2020  . Chronic cough 05/31/2020  . COVID-19 05/31/2020  . 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 Seasonal allergic conjunctivitis 06/24/2018  . Other allergic rhinitis 06/24/2018  . Other dysphagia 06/24/2018  . Fibromyalgia 12/05/2017  . Vitamin D deficiency 11/26/2016  . Insomnia 11/26/2016  . ADHD 11/26/2016  . Obesity (BMI 30-39.9) 01/27/2016  . Migraine 12/29/2014  . Plantar fasciitis, bilateral 11/01/2014  . Sialoadenitis 10/21/2013  . Physical exam 10/23/2010  . FIBROMYALGIA 11/09/2009  . Thyroid nodule 10/21/2009    Social History   Tobacco Use  . Smoking status:  Former Smoker    Packs/day: 1.00    Years: 11.00    Pack years: 11.00    Types: Cigarettes    Quit date: 05/07/1997    Years since quitting: 23.2  . Smokeless tobacco: Never Used  . Tobacco comment: no E cigs  Substance Use Topics  . Alcohol use: No    Alcohol/week: 0.0 standard drinks    Current Outpatient Medications:  .  acetaminophen (TYLENOL) 325 MG tablet, Take 650 mg by mouth every 6 (six) hours as needed., Disp: , Rfl:  .  amphetamine-dextroamphetamine (ADDERALL XR) 20 MG 24 hr capsule, Take 1 capsule (20 mg total) by mouth every morning., Disp: 30 capsule, Rfl: 0 .  Calcium Carbonate-Vit D-Min (CALCIUM 1200 PO), Take 1 capsule by mouth daily. Vitamin C as well, Disp: , Rfl:  .  chlorpheniramine (CHLOR-TRIMETON) 4 MG tablet, Take 4 mg by mouth every 4 (four) hours as needed. 2AT BEDTIME, Disp: , Rfl:  .  EPINEPHrine 0.3 mg/0.3 mL IJ SOAJ injection, Inject 0.3 mLs (0.3 mg total) into the muscle as needed for anaphylaxis., Disp: 1 Device, Rfl: 1 .  famotidine (PEPCID) 20 MG tablet, Take 20 mg by mouth 2 (two) times daily., Disp: , Rfl:  .  gabapentin (NEURONTIN) 300 MG capsule, One four times daily, Disp: , Rfl:  .  MULTIPLE VITAMIN PO, Take 1 capsule by mouth daily., Disp: , Rfl:  .  pantoprazole (PROTONIX) 40 MG tablet, Take 1 tablet (40 mg total) by mouth daily. Take 30-60 min before first meal of the day, Disp: 30 tablet,  Rfl: 2 .  promethazine-dextromethorphan (PROMETHAZINE-DM) 6.25-15 MG/5ML syrup, Take 5 mLs by mouth 4 (four) times daily as needed., Disp: 473 mL, Rfl: 0 .  rizatriptan (MAXALT-MLT) 10 MG disintegrating tablet, Take 1 tablet (10 mg total) by mouth 3 (three) times daily as needed for migraine. May repeat in 2 hours if needed, Disp: 9 tablet, Rfl: 3 .  traZODone (DESYREL) 50 MG tablet, TAKE 0.5-1 TABLETS BY MOUTH AT BEDTIME AS NEEDED FOR SLEEP., Disp: 90 tablet, Rfl: 2 .  triamcinolone (NASACORT) 55 MCG/ACT AERO nasal inhaler, Place 2 sprays into the nose daily.,  Disp: 1 Inhaler, Rfl: 1 .  vitamin B-12 (CYANOCOBALAMIN) 500 MCG tablet, Take 500 mcg by mouth daily., Disp: , Rfl:   Allergies  Allergen Reactions  . Aspirin Anaphylaxis    Pt reports being able to take ibuprofen with no problems.  . Citrus Hives and Swelling  . Codeine Other (See Comments)    halucinations  . Eggs Or Egg-Derived Products Other (See Comments)    Patient states ok with flu shots, etc. No anaphalaxis.  Eggs give stomach cramps with diarrhea. After diarrhea ok.-pt eats baked goods that contains eggs with no issues   . Erythromycin Rash  . Lactose Intolerance (Gi) Diarrhea    Diarrhea with lactose.  Marland Kitchen Penicillins Rash  . Sulfonamide Derivatives Rash    Childhood reaction, has taken recently with only stomach irritation.    Objective:   There were no vitals taken for this visit. AAOx3, NAD NCAT, EOMI No obvious CN deficits Coloring WNL Pt is unable to speak w/o hacking cough and difficulty catching her breath Thought process is linear.  Mood is appropriate.   Assessment and Plan:   Bacterial sinusitis- pt w/ multiple days of sxs, fever to 102, maxillary facial pain.  Start Doxycycline BID.  Reviewed supportive care and red flags that should prompt return.  Pt expressed understanding and is in agreement w/ plan.   Asthma exacerbation- deteriorated.  Pt barely able to speak w/o deep, hacking, spasmodic cough.  She is to restart her Symbicort inhaler 2 puffs BID, use her albuterol as needed.  Start oral prednisone taper.  Discussed when she should go to the ER- O2 <90%, change of color around mouth or tips of fingers, inability to catch her breath.  Pt expressed understanding and is in agreement w/ plan.    Annye Asa, MD 07/21/2020

## 2020-07-21 NOTE — Progress Notes (Signed)
I connected with  Abigail Singh on 07/21/20 by a video enabled telemedicine application and verified that I am speaking with the correct person using two identifiers.   I discussed the limitations of evaluation and management by telemedicine. The patient expressed understanding and agreed to proceed.

## 2020-07-27 ENCOUNTER — Ambulatory Visit: Payer: BC Managed Care – PPO | Admitting: Psychology

## 2020-07-28 ENCOUNTER — Encounter: Payer: Self-pay | Admitting: Family Medicine

## 2020-07-28 ENCOUNTER — Ambulatory Visit (INDEPENDENT_AMBULATORY_CARE_PROVIDER_SITE_OTHER): Payer: BC Managed Care – PPO | Admitting: Psychology

## 2020-07-28 DIAGNOSIS — F4310 Post-traumatic stress disorder, unspecified: Secondary | ICD-10-CM

## 2020-07-29 ENCOUNTER — Encounter: Payer: Self-pay | Admitting: Family Medicine

## 2020-07-29 ENCOUNTER — Telehealth (INDEPENDENT_AMBULATORY_CARE_PROVIDER_SITE_OTHER): Payer: BC Managed Care – PPO | Admitting: Family Medicine

## 2020-07-29 VITALS — BP 124/93 | HR 154 | Temp 98.4°F

## 2020-07-29 DIAGNOSIS — G4734 Idiopathic sleep related nonobstructive alveolar hypoventilation: Secondary | ICD-10-CM

## 2020-07-29 DIAGNOSIS — U099 Post covid-19 condition, unspecified: Secondary | ICD-10-CM

## 2020-07-29 NOTE — Progress Notes (Signed)
Virtual Visit via Video   I connected with patient on 07/29/20 at 10:30 AM EDT by a video enabled telemedicine application and verified that I am speaking with the correct person using two identifiers.  Location patient: Home Location provider: [facility], Office Persons participating in the virtual visit: Patient, Provider, [place] (Sabrina M)  I discussed the limitations of evaluation and management by telemedicine and the availability of in person appointments. The patient expressed understanding and agreed to proceed.  Subjective:   HPI:   Cough- pt reports ongoing chronic cough, tachycardia, fluctuating BP.  Continues to struggle w/ brain fog.  Pt doesn't feel things are worsening but she is not well enough to return.  Went to ENT on Wednesday- referred for Speech Eval and appt is April 6th.  Last Prednisone is this AM and last antibiotic will be Sunday.  Continues to have overwhelming exhaustion.  Pt's 1 yr anniversary of COVID is tomorrow.  Pt's O2 levels continue to drop overnight to 85-87% and will cause her Apple watch to alarm.  ROS:   See pertinent positives and negatives per HPI.  Patient Active Problem List   Diagnosis Date Noted  . DOE (dyspnea on exertion) 07/08/2020  . Alteration in speech 06/28/2020  . Depression 06/15/2020  . Chronic cough 05/31/2020  . COVID-19 05/31/2020  . 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 Seasonal allergic conjunctivitis 06/24/2018  . Other allergic rhinitis 06/24/2018  . Other dysphagia 06/24/2018  . Fibromyalgia 12/05/2017  . Vitamin D deficiency 11/26/2016  . Insomnia 11/26/2016  . ADHD 11/26/2016  . Obesity (BMI 30-39.9) 01/27/2016  . Migraine 12/29/2014  . Plantar fasciitis, bilateral 11/01/2014  . Sialoadenitis 10/21/2013  . Physical exam 10/23/2010  . FIBROMYALGIA 11/09/2009  . Thyroid nodule 10/21/2009    Social History   Tobacco Use  . Smoking status: Former Smoker    Packs/day: 1.00     Years: 11.00    Pack years: 11.00    Types: Cigarettes    Quit date: 05/07/1997    Years since quitting: 23.2  . Smokeless tobacco: Never Used  . Tobacco comment: no E cigs  Substance Use Topics  . Alcohol use: No    Alcohol/week: 0.0 standard drinks    Current Outpatient Medications:  .  acetaminophen (TYLENOL) 325 MG tablet, Take 650 mg by mouth every 6 (six) hours as needed., Disp: , Rfl:  .  amphetamine-dextroamphetamine (ADDERALL XR) 20 MG 24 hr capsule, Take 1 capsule (20 mg total) by mouth every morning., Disp: 30 capsule, Rfl: 0 .  Calcium Carbonate-Vit D-Min (CALCIUM 1200 PO), Take 1 capsule by mouth daily. Vitamin C as well, Disp: , Rfl:  .  chlorpheniramine (CHLOR-TRIMETON) 4 MG tablet, Take 4 mg by mouth every 4 (four) hours as needed. 2AT BEDTIME, Disp: , Rfl:  .  doxycycline (VIBRA-TABS) 100 MG tablet, Take 1 tablet (100 mg total) by mouth 2 (two) times daily., Disp: 20 tablet, Rfl: 0 .  EPINEPHrine 0.3 mg/0.3 mL IJ SOAJ injection, Inject 0.3 mLs (0.3 mg total) into the muscle as needed for anaphylaxis., Disp: 1 Device, Rfl: 1 .  famotidine (PEPCID) 20 MG tablet, Take 20 mg by mouth 2 (two) times daily., Disp: , Rfl:  .  gabapentin (NEURONTIN) 300 MG capsule, One four times daily, Disp: , Rfl:  .  MULTIPLE VITAMIN PO, Take 1 capsule by mouth daily., Disp: , Rfl:  .  pantoprazole (PROTONIX) 40 MG tablet, Take 1 tablet (40 mg total) by  mouth daily. Take 30-60 min before first meal of the day, Disp: 30 tablet, Rfl: 2 .  predniSONE (DELTASONE) 10 MG tablet, 3 tabs x3 days and then 2 tabs x3 days and then 1 tab x3 days.  Take w/ food., Disp: 18 tablet, Rfl: 0 .  promethazine-dextromethorphan (PROMETHAZINE-DM) 6.25-15 MG/5ML syrup, Take 5 mLs by mouth 4 (four) times daily as needed., Disp: 473 mL, Rfl: 0 .  rizatriptan (MAXALT-MLT) 10 MG disintegrating tablet, Take 1 tablet (10 mg total) by mouth 3 (three) times daily as needed for migraine. May repeat in 2 hours if needed, Disp: 9  tablet, Rfl: 3 .  traZODone (DESYREL) 50 MG tablet, TAKE 0.5-1 TABLETS BY MOUTH AT BEDTIME AS NEEDED FOR SLEEP., Disp: 90 tablet, Rfl: 2 .  triamcinolone (NASACORT) 55 MCG/ACT AERO nasal inhaler, Place 2 sprays into the nose daily., Disp: 1 Inhaler, Rfl: 1 .  vitamin B-12 (CYANOCOBALAMIN) 500 MCG tablet, Take 500 mcg by mouth daily., Disp: , Rfl:   Allergies  Allergen Reactions  . Aspirin Anaphylaxis    Pt reports being able to take ibuprofen with no problems.  . Citrus Hives and Swelling  . Codeine Other (See Comments)    halucinations  . Eggs Or Egg-Derived Products Other (See Comments)    Patient states ok with flu shots, etc. No anaphalaxis.  Eggs give stomach cramps with diarrhea. After diarrhea ok.-pt eats baked goods that contains eggs with no issues   . Erythromycin Rash  . Lactose Intolerance (Gi) Diarrhea    Diarrhea with lactose.  Marland Kitchen Penicillins Rash  . Sulfonamide Derivatives Rash    Childhood reaction, has taken recently with only stomach irritation.    Objective:   BP (!) 124/93   Pulse (!) 154   Temp 98.4 F (36.9 C) (Temporal)   SpO2 93%  AAOx3, NAD NCAT, EOMI No obvious CN deficits Coloring WNL Pt still has difficulty speaking in complete sentences without getting winded.  Near constant dry cough Thought process is linear.  Mood is appropriate.   Assessment and Plan:   Nocturnal hypoxia- new.  Pt's Apple watch is recording O2 sats of 85-87% at night.  She is still considerably short of breath when speaking or with any sort of physical exertion.  She also continues to have excessive fatigue.  Concern that she may not be getting restful sleep bc of her hypoxia which is prolonging her already long covid.  Will forward to pulmonary and ask about an official overnight oximetry or sleep study.  Pt expressed understanding and is in agreement w/ plan.   Long COVID- pt reports tomorrow is the 1 yr anniversary of her sxs.  She continues to struggle w/ exhaustion, cough,  tachycardia, labile BP.  She has seen multiple specialists and still has appts upcoming w/ speech and rheumatology.  At this point, she is not able to work and a letter was sent to express this.  Will continue to follow closely.    Annye Asa, MD 07/29/2020

## 2020-07-29 NOTE — Progress Notes (Signed)
I connected with  Abigail Singh on 07/29/20 by a video enabled telemedicine application and verified that I am speaking with the correct person using two identifiers.   I discussed the limitations of evaluation and management by telemedicine. The patient expressed understanding and agreed to proceed.

## 2020-08-01 ENCOUNTER — Encounter: Payer: Self-pay | Admitting: Family Medicine

## 2020-08-02 ENCOUNTER — Encounter: Payer: Self-pay | Admitting: Family Medicine

## 2020-08-09 ENCOUNTER — Telehealth: Payer: Self-pay | Admitting: Family Medicine

## 2020-08-09 NOTE — Telephone Encounter (Signed)
Received fax from [employer] has for medical records from 07/05/2020 to present to be faxed to them.  Sent this request to [facility] records.

## 2020-08-10 ENCOUNTER — Ambulatory Visit (INDEPENDENT_AMBULATORY_CARE_PROVIDER_SITE_OTHER): Payer: BC Managed Care – PPO | Admitting: Psychology

## 2020-08-10 DIAGNOSIS — F4310 Post-traumatic stress disorder, unspecified: Secondary | ICD-10-CM

## 2020-08-11 ENCOUNTER — Encounter: Payer: Self-pay | Admitting: Family Medicine

## 2020-08-11 DIAGNOSIS — F900 Attention-deficit hyperactivity disorder, predominantly inattentive type: Secondary | ICD-10-CM

## 2020-08-11 MED ORDER — AMPHETAMINE-DEXTROAMPHET ER 20 MG PO CP24: 20.0000 mg | ORAL_CAPSULE | ORAL | 0 refills | Status: DC

## 2020-08-11 NOTE — Telephone Encounter (Signed)
Patient is requesting a refill of the following medications: Requested Prescriptions   Pending Prescriptions Disp Refills  . amphetamine-dextroamphetamine (ADDERALL XR) 20 MG 24 hr capsule 30 capsule 0    Sig: Take 1 capsule (20 mg total) by mouth every morning.    Date of patient request: 08/11/20 Last office visit: 07/29/2020 virtual Date of last refill: 07/11/20 Last refill amount: 30 capsules  Pt requested refill of cough medication as well.   Also asking about sleep study   Please advise

## 2020-08-11 NOTE — Telephone Encounter (Signed)
Needs sleep medicine consultation so we order the right test and do the f/u needed (insuance can be an issue and don't want her to end up with a bill they won't pay)

## 2020-08-11 NOTE — Telephone Encounter (Signed)
Patient sent email regarding recommendations for a sleep study  Dr. Melvyn Novas,  Good afternoon. Dr. Birdie Riddle would like me to have a spirometer night test or a sleep study. I don't know the correct terminology. She said she was going to send you a message about it two weeks ago. My O2 is still dropping at night. I am attaching the graph from my O2 readings for the last week and month.  Please let me know if you think it is necessary.   Thank you for your help, Abigail Singh [phone]   Sending to Dr. Melvyn Novas for further recommendations

## 2020-08-15 ENCOUNTER — Telehealth: Payer: Self-pay

## 2020-08-15 NOTE — Telephone Encounter (Signed)
Bethann Punches, SLP called back re: pt. She was asking for more information re: next step for pt.  (needing more assessment on neuro end or if not neuro then needing it more related to functional disorder/ psych).  I did relay the last  email on 07-04-20 and she stated that is exactly what she needed and I faxed to her [phone] with fax confirmation received.  [phone].

## 2020-08-15 NOTE — Telephone Encounter (Signed)
I called Abigail Singh that may be able to see notes on care everywhere re: neuro for her.   See that we did not refer.  She is to call back if needed.

## 2020-08-15 NOTE — Telephone Encounter (Signed)
Marlana Latus, SLP at [facility] is treating this pt. Pt was referred for a speech evaluation. Belenda Cruise needs more clarification on neuro history because proceeding with speech therapy. Belenda Cruise can be reached at [phone]. She is with patients starting at noon today. Please left a voicemail if she cannot answer.

## 2020-08-16 ENCOUNTER — Other Ambulatory Visit: Payer: Self-pay | Admitting: Internal Medicine

## 2020-08-16 NOTE — Telephone Encounter (Signed)
Dr. Melvyn Novas, please advise if you are okay refilling med.

## 2020-08-19 ENCOUNTER — Ambulatory Visit (INDEPENDENT_AMBULATORY_CARE_PROVIDER_SITE_OTHER): Payer: BC Managed Care – PPO | Admitting: Psychology

## 2020-08-19 DIAGNOSIS — F4310 Post-traumatic stress disorder, unspecified: Secondary | ICD-10-CM | POA: Diagnosis not present

## 2020-08-22 NOTE — Progress Notes (Signed)
Office Visit Note  Patient: Abigail Singh             Date of Birth: 30-Mar-1976           MRN: [id number]             PCP: Midge Minium, MD Referring: Midge Minium, MD Visit Date: 08/23/2020 Occupation: _0 @  Subjective:  +ANA.   History of Present Illness: Abigail Singh is a 45 y.o. female seen in consultation per request of her PCP.  According to patient she was diagnosed with fibromyalgia in early 2000's by me.  She states she did well after some time and when she got pregnant in 2009 her symptoms got much better.  She had only occasional flares.  She acquired COVID in March 2021 and after that she started having episodes of cough and shortness of breath.  She states she had low oxygen saturation for a while and missed about 9 weeks of work.  Eventually in December 2021 she started feeling better but she had another COVID-19 infection in January 2022 after that her cough and shortness of breath got worse.  She had crackles in her chest and she was given possible diagnosis of pneumonia.  I do not see a chest x-ray which showed abnormalities in January.  She states that she has been having extreme fatigue, palpitations, difficulty talking, brain fog and tingling and numbness since then.  She was referred to Dr.Wert who evaluated her and thought that her symptoms may be related to reflux.  He started her on antireflux measures.  He also referred her to ENT because she has been having recurrent problems with the enlarged lymph nodes and oral ulcers.  The ENT physician at [facility] referred her to voice therapy which has helped her to have conversation without coughing.  She has been also referred to a neurologist last week and appointment is pending.  She had been seeing a neurologist for migraines for a long time.  She also gives history of chronic sinusitis which is caused frequent infections in the past requiring antibiotics.  Although since she has had COVID-19 infection she  states she has been on prednisone and antibiotics almost once or twice a month for the last year.  The last dose of prednisone and antibiotics was given in March 2022.  She recently had labs done and her ANA was positive for that reason she was referred to me.  She also complains of low-grade fevers which went from 99 to 100 F.  No family history of autoimmune disease.  She is gravida 6, para 3, miscarriages 3.  She gives history of discoloration in her hands and feet and sun sensitivity.  There is no history of nasal ulcers, malar rash, sicca symptoms.  She gives history of lymphadenopathy.  Activities of Daily Living:  Patient reports morning stiffness for  20-30 minutes.   Patient Reports nocturnal pain.  Difficulty dressing/grooming: Reports Difficulty climbing stairs: Reports Difficulty getting out of chair: Reports Difficulty using hands for taps, buttons, cutlery, and/or writing: Reports  Review of Systems  Constitutional: Positive for fatigue and fever. Negative for night sweats, weight gain and weight loss.  HENT: Positive for mouth sores. Negative for trouble swallowing, trouble swallowing, mouth dryness and nose dryness.   Eyes: Positive for visual disturbance. Negative for pain, redness, itching and dryness.  Respiratory: Positive for shortness of breath and difficulty breathing. Negative for cough.   Cardiovascular: Positive for palpitations. Negative for chest pain,  hypertension, irregular heartbeat and swelling in legs/feet.  Gastrointestinal: Negative for blood in stool, constipation and diarrhea.  Endocrine: Negative for increased urination.  Genitourinary: Negative for difficulty urinating and vaginal dryness.  Musculoskeletal: Positive for arthralgias, joint pain, myalgias, morning stiffness, muscle tenderness and myalgias. Negative for joint swelling and muscle weakness.  Skin: Positive for color change, hair loss and sensitivity to sunlight. Negative for rash, redness, skin  tightness and ulcers.  Allergic/Immunologic: Negative for susceptible to infections.  Neurological: Positive for dizziness, numbness, headaches and memory loss. Negative for night sweats and weakness.  Hematological: Positive for bruising/bleeding tendency and swollen glands.  Psychiatric/Behavioral: Positive for depressed mood and sleep disturbance. Negative for confusion. The patient is nervous/anxious.     PMFS History:  Patient Active Problem List   Diagnosis Date Noted  . DOE (dyspnea on exertion) 07/08/2020  . Alteration in speech 06/28/2020  . Depression 06/15/2020  . Chronic cough 05/31/2020  . COVID-19 05/31/2020  . 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 Seasonal allergic conjunctivitis 06/24/2018  . Other allergic rhinitis 06/24/2018  . Other dysphagia 06/24/2018  . Fibromyalgia 12/05/2017  . Vitamin D deficiency 11/26/2016  . Insomnia 11/26/2016  . ADHD 11/26/2016  . Obesity (BMI 30-39.9) 01/27/2016  . Migraine 12/29/2014  . Plantar fasciitis, bilateral 11/01/2014  . Sialoadenitis 10/21/2013  . Physical exam 10/23/2010  . FIBROMYALGIA 11/09/2009  . Thyroid nodule 10/21/2009    Past Medical History:  Diagnosis Date  . Allergy    sseasonal  . Anemia    as teenager  . Anxiety   . Depression   . Fibroid   . Fibromyalgia   . Heartburn in pregnancy   . History of fibromyalgia   . Migraines   . Neuromuscular disorder (HCC)    fibromyalgia  . Postpartum care following cesarean delivery 03/17/2012  . Preterm labor   . Status post cesarean delivery (repeat x 2) 03/17/2012  . Urinary tract infection     Family History  Problem Relation Age of Onset  . Diabetes Mother   . High Cholesterol Mother   . Diabetes Father   . Food Allergy Father   . High Cholesterol Father   . Heart attack Father   . Hypertension Father   . Diabetes Maternal Grandmother   . Colon polyps Maternal Grandmother   . Diabetes Maternal Grandfather   . Colon  polyps Maternal Grandfather   . Heart disease Maternal Grandfather   . Colon cancer Paternal Grandmother   . Heart disease Paternal Grandfather   . Heart failure Other        Grandfather  . Heart attack Other        x's 4 Grandfather  . Stroke Other        x's 1 Grandfather  . Diabetes Other   . Diabetes Maternal Aunt   . Allergic rhinitis Daughter   . Eczema Daughter   . Anxiety disorder Daughter   . Lactose intolerance Daughter   . OCD Daughter   . Anxiety disorder Daughter   . Lactose intolerance Daughter   . Anxiety disorder Daughter   . Asthma Daughter   . Lactose intolerance Daughter   . Angioedema Neg Hx   . Immunodeficiency Neg Hx    Past Surgical History:  Procedure Laterality Date  . abnormal cells in colon/tatoo area     2012  . CESAREAN SECTION  2009  . CESAREAN SECTION  03/17/2012   Procedure: CESAREAN SECTION;  Surgeon: Sheronette A Cousins,   MD;  Location: [facility];  Service: Obstetrics;  Laterality: N/A;  Repeat C/S.  EDD: 03/23/12  . COLONOSCOPY    . DILATION AND CURETTAGE OF UTERUS    . POLYPECTOMY    . tubes in ears    . WISDOM TOOTH EXTRACTION     Social History   Social History Narrative  . Not on file   Immunization History  Administered Date(s) Administered  . DTP 03/16/1976, 05/18/1976, 07/20/1976, 07/12/1977, 03/16/1981  . Influenza,inj,Quad PF,6+ Mos 07/01/2015, 01/27/2016, 01/23/2017, 01/20/2018, 01/03/2019, 02/14/2020  . MMR 05/17/1977, 12/22/1987  . OPV 03/16/1976, 05/18/1976, 08/01/1977, 03/16/1981  . PFIZER(Purple Top)SARS-COV-2 Vaccination 07/02/2019, 07/29/2019, 02/28/2020  . Td 10/31/1993  . Tdap 11/01/2011     Objective: Vital Signs: BP 120/89 (BP Location: Right Arm, Patient Position: Sitting, Cuff Size: Normal)   Pulse (!) 134   Resp 15   Ht 5' 4" (1.626 m)   Wt 215 lb 6.4 oz (97.7 kg)   BMI 36.97 kg/m    Physical Exam Vitals and nursing note reviewed.  Constitutional:      Appearance: She is well-developed.  HENT:      Head: Normocephalic and atraumatic.  Eyes:     Conjunctiva/sclera: Conjunctivae normal.  Cardiovascular:     Rate and Rhythm: Normal rate and regular rhythm.     Heart sounds: Normal heart sounds.  Pulmonary:     Effort: Pulmonary effort is normal.     Breath sounds: Normal breath sounds.  Abdominal:     General: Bowel sounds are normal.     Palpations: Abdomen is soft.  Musculoskeletal:     Cervical back: Normal range of motion.  Lymphadenopathy:     Cervical: No cervical adenopathy.  Skin:    General: Skin is warm and dry.     Capillary Refill: Capillary refill takes less than 2 seconds.  Neurological:     Mental Status: She is alert and oriented to person, place, and time.  Psychiatric:        Behavior: Behavior normal.      Musculoskeletal Exam: C-spine thoracic and lumbar spine good range of motion.  Shoulder joints, elbow joints, wrist joints, MCPs PIPs and DIPs with good range of motion with no synovitis.  Hip joints, knee joints, ankles, MTPs and PIPs with good range of motion with no synovitis.  She had generalized hyperalgesia and positive tender points.  CDAI Exam: CDAI Score: -- Patient Global: --; Provider Global: -- Swollen: --; Tender: -- Joint Exam 08/23/2020   No joint exam has been documented for this visit   There is currently no information documented on the homunculus. Go to the Rheumatology activity and complete the homunculus joint exam.  Investigation: No additional findings.  Imaging: No results found.  Recent Labs: Lab Results  Component Value Date   WBC 9.4 07/08/2020   HGB 13.5 07/08/2020   PLT 318.0 07/08/2020   NA 140 07/08/2020   K 4.0 07/08/2020   CL 106 07/08/2020   CO2 23 07/08/2020   GLUCOSE 90 07/08/2020   BUN 17 07/08/2020   CREATININE 0.82 07/08/2020   BILITOT 0.3 07/11/2020   ALKPHOS 82 07/11/2020   AST 20 07/11/2020   ALT 22 07/11/2020   PROT 7.1 07/11/2020   ALBUMIN 4.0 07/11/2020   CALCIUM 9.1 07/08/2020    GFRAA >60 09/24/2019    Speciality Comments: No specialty comments available.  Procedures:  No procedures performed Allergies: Aspirin, Citrus, Codeine, Eggs or egg-derived products, Erythromycin, Lactose intolerance (gi), Penicillins, and Sulfonamide   derivatives   Assessment / Plan:     Visit Diagnoses: Positive ANA (antinuclear antibody) - 07/11/20: ANA 1:80NS, vitamin D 53.78, ESR 19, TSH 1.58.  Patient has low titer positive ANA.  She gives history of frequent oral ulcers, lymphadenopathy, hair loss, Raynauds, photosensitivity, fatigue, myalgias.  I did not notice any oral ulcers or lymphadenopathy on my examination.  She states she has had frequent prednisone and antibiotics because of lymph node enlargement and infections.  The last course was given in March 2022.  She gives history of fatigue and myalgia and she has underlying fibromyalgia syndrome.  Hair thinning was noted.  She had good capillary refill and no nailbed capillary changes.  I will obtain AVISE labs.  Sialoadenitis-she gives history of recurrent parotid swelling and lymph node enlargement.  I did not notice that on the examination.  She has an appointment coming up with the ENT physician at [facility].  Chronic cough-she has had chronic cough since she had COVID-19 infection in March 2021.  She states she had cough severe enough to the point that she could not have a conversation.  She went through voice therapy which helped her.  She has been evaluated by Dr. Melvyn Novas.  I reviewed x-rays done from January and February 2022 which were within normal limits.  She also had PFTs in May 2021 which were within normal limits.  Patient states that her oxygen saturation was low for 9 months last year and she still drops down in the 80s.    Recurrent sinusitis-she had history of recurrent sinusitis all her life.  She states the infections used to be only twice per year.  History of gastroesophageal reflux (GERD)-she has been diagnosed with  gastroesophageal reflux by Dr. Melvyn Novas.  She had a barium swallow study which showed a small reducible hernia and mild gastroesophageal reflux on July 11, 2020.  She has not noticed much improvement with the antireflux measures.  Vitamin D deficiency-her vitamin D was normal in March 2022.  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  Although she does not have much joint discomfort.  No synovitis was noted.  Fibromyalgia-patient states she was diagnosed with fibromyalgia syndrome in early 2000's by me.  She states the symptoms have been manageable until she had COVID-19 infection.  Since then she has been experiencing increased pain and discomfort.  Other insomnia-she has history of chronic insomnia.  Hx of migraines-she has history of chronic migraines and has been followed by Dr. Juleen China.  Coarse tremors-she has new onset tremor since COVID-19 infection.  She has been referred to a neurologist at [facility] per patient.  Thyroid nodule  History of posttraumatic stress disorder (PTSD) - She sees a therapist.  History of depression  COVID-19 virus infection - March 2021, January 2022 with pneumonia.  Residual shortness of breath, chest tightness, palpitations.  She states she has long COVID and she has many symptoms which makes her dysfunctional.  She has been on medical leave since January 2022.  Former smoker - Half a pack per day for years and quit smoking in 2000.  Orders: No orders of the defined types were placed in this encounter.  No orders of the defined types were placed in this encounter.  Follow-Up Instructions: Return for +ANA.   Bo Merino, MD  Note - This record has been created using Editor, commissioning.  Chart creation errors have been sought, but may not always  have been located. Such creation errors do not reflect on  the standard of medical care.

## 2020-08-23 ENCOUNTER — Encounter: Payer: Self-pay | Admitting: Rheumatology

## 2020-08-23 ENCOUNTER — Ambulatory Visit: Payer: BC Managed Care – PPO | Admitting: Rheumatology

## 2020-08-23 ENCOUNTER — Other Ambulatory Visit: Payer: Self-pay

## 2020-08-23 VITALS — BP 120/89 | HR 134 | Resp 15 | Ht 64.0 in | Wt 215.4 lb

## 2020-08-23 DIAGNOSIS — E559 Vitamin D deficiency, unspecified: Secondary | ICD-10-CM

## 2020-08-23 DIAGNOSIS — M2550 Pain in unspecified joint: Secondary | ICD-10-CM

## 2020-08-23 DIAGNOSIS — R768 Other specified abnormal immunological findings in serum: Secondary | ICD-10-CM

## 2020-08-23 DIAGNOSIS — M797 Fibromyalgia: Secondary | ICD-10-CM

## 2020-08-23 DIAGNOSIS — J329 Chronic sinusitis, unspecified: Secondary | ICD-10-CM | POA: Diagnosis not present

## 2020-08-23 DIAGNOSIS — M722 Plantar fascial fibromatosis: Secondary | ICD-10-CM

## 2020-08-23 DIAGNOSIS — U071 COVID-19: Secondary | ICD-10-CM

## 2020-08-23 DIAGNOSIS — Z8719 Personal history of other diseases of the digestive system: Secondary | ICD-10-CM

## 2020-08-23 DIAGNOSIS — Z8659 Personal history of other mental and behavioral disorders: Secondary | ICD-10-CM

## 2020-08-23 DIAGNOSIS — R053 Chronic cough: Secondary | ICD-10-CM | POA: Diagnosis not present

## 2020-08-23 DIAGNOSIS — G252 Other specified forms of tremor: Secondary | ICD-10-CM

## 2020-08-23 DIAGNOSIS — E041 Nontoxic single thyroid nodule: Secondary | ICD-10-CM

## 2020-08-23 DIAGNOSIS — Z87891 Personal history of nicotine dependence: Secondary | ICD-10-CM

## 2020-08-23 DIAGNOSIS — K1120 Sialoadenitis, unspecified: Secondary | ICD-10-CM | POA: Diagnosis not present

## 2020-08-23 DIAGNOSIS — Z8669 Personal history of other diseases of the nervous system and sense organs: Secondary | ICD-10-CM

## 2020-08-23 DIAGNOSIS — G4709 Other insomnia: Secondary | ICD-10-CM

## 2020-08-26 ENCOUNTER — Encounter: Payer: Self-pay | Admitting: Family Medicine

## 2020-08-30 NOTE — Progress Notes (Signed)
08/31/20- 27 yoF former smoker( 11 pkyrs) for sleep evaluation courtesy of Dr Birdie Riddle with concern of nocturnal hypoxemia and daytime fatigue. Has seen Dr Melvyn Novas for hx Cough. Medical problem list includes Migraine, Allergic Rhinitis, Thyroid nodule, Fibromyalgia, Depression, Covid infection Jan 2022, Anaphylaxis-food, Chronic Cough PFT 10/02/19- WNL CXR 07/02/19- Negative Med list includes Albuterol hfa, Adderall XR 20, Symbicort, Gabapentin, Promethazine -DM,  Trazodone 50 hs,  Epworth score-12 Body weight today-214 lbs Covid vax-3 Phizer Flu vax-had- ----Patient states that her O2 levels drop when she is sleeping. She has a watch that notifies her when it happens. States that she may snore but denies that she stops breathing. Patient had Covid in March 2021, Jan 22 had covid pneumonia and states that before that she would wake up gasping for air.  Seeing Dr Joya Gaskins at [facility]- vocal cord assessment. Has also seen Dr Redmond Baseman ENT. She says there is consensus that Covid damaged nerves in her throat.  Husband wears CPAP-sleeps deeply and doesn't notice her much. No hx ENT surgery Trazodone for sleep. Naps most days. 1-2 cups coffee and a soda.  Has had tachycardia and increased BP in past year- possible connection to Covid.   Prior to Admission medications   Medication Sig Start Date End Date Taking? Authorizing Provider  acetaminophen (TYLENOL) 325 MG tablet Take 650 mg by mouth every 6 (six) hours as needed.   Yes [provider]  albuterol (VENTOLIN HFA) 108 (90 Base) MCG/ACT inhaler Inhale into the lungs as needed for wheezing or shortness of breath.   Yes [provider]  amphetamine-dextroamphetamine (ADDERALL XR) 20 MG 24 hr capsule Take 1 capsule (20 mg total) by mouth every morning. 08/11/20  Yes Midge Minium, MD  BIOTIN PO Take by mouth daily.   Yes [provider]  Budesonide-Formoterol Fumarate (SYMBICORT IN) Inhale into the lungs daily.   Yes [provider]  Calcium Carbonate-Vit D-Min (CALCIUM 1200 PO) Take 1 capsule by mouth daily. Vitamin C as well   Yes [provider]  chlorpheniramine (CHLOR-TRIMETON) 4 MG tablet Take 4 mg by mouth every 4 (four) hours as needed. 2AT BEDTIME   Yes [provider]  EPINEPHrine 0.3 mg/0.3 mL IJ SOAJ injection Inject 0.3 mLs (0.3 mg total) into the muscle as needed for anaphylaxis. 06/09/18  Yes Midge Minium, MD  famotidine (PEPCID) 20 MG tablet Take 20 mg by mouth 2 (two) times daily.   Yes [provider]  gabapentin (NEURONTIN) 300 MG capsule One four times daily 07/10/20  Yes Tanda Rockers, MD  MULTIPLE VITAMIN PO Take 1 capsule by mouth daily.   Yes [provider]  Multiple Vitamins-Minerals (ZINC PO) Take by mouth daily.   Yes [provider]  pantoprazole (PROTONIX) 40 MG tablet Take 1 tablet (40 mg total) by mouth daily. Take 30-60 min before first meal of the day 06/09/20  Yes Tanda Rockers, MD  Probiotic Product (PROBIOTIC PO) Take by mouth daily.   Yes [provider]  promethazine-dextromethorphan (PROMETHAZINE-DM) 6.25-15 MG/5ML syrup TAKE 5 MLS BY MOUTH 4 (FOUR) TIMES DAILY AS NEEDED. 08/16/20  Yes Tanda Rockers, MD  QUERCETIN PO Take by mouth daily.   Yes [provider]  rizatriptan (MAXALT-MLT) 10 MG disintegrating tablet Take 1 tablet (10 mg total) by mouth 3 (three) times daily as needed for migraine. May repeat in 2 hours if needed 06/22/19  Yes Kathrynn Ducking, MD  traZODone (DESYREL) 50 MG tablet TAKE 0.5-1 TABLETS  BY MOUTH AT BEDTIME AS NEEDED FOR SLEEP. 05/23/20  Yes Midge Minium, MD  triamcinolone (NASACORT) 55 MCG/ACT AERO nasal inhaler Place 2 sprays into the nose daily. 11/24/19  Yes Brunetta Jeans, PA-C  vitamin B-12 (CYANOCOBALAMIN) 500 MCG tablet Take 500 mcg by mouth daily.   Yes [provider]   Past Medical History:  Diagnosis Date  . Allergy    sseasonal  . Anemia    as  teenager  . Anxiety   . Depression   . Fibroid   . Fibromyalgia   . Heartburn in pregnancy   . History of fibromyalgia   . Migraines   . Neuromuscular disorder (HCC)    fibromyalgia  . Postpartum care following cesarean delivery 03/17/2012  . Preterm labor   . Status post cesarean delivery (repeat x 2) 03/17/2012  . Urinary tract infection    Past Surgical History:  Procedure Laterality Date  . abnormal cells in colon/tatoo area     2012  . CESAREAN SECTION  2009  . CESAREAN SECTION  03/17/2012   Procedure: CESAREAN SECTION;  Surgeon: Marvene Staff, MD;  Location: [facility];  Service: Obstetrics;  Laterality: N/A;  Repeat C/S.  EDD: 03/23/12  . COLONOSCOPY    . DILATION AND CURETTAGE OF UTERUS    . POLYPECTOMY    . tubes in ears    . WISDOM TOOTH EXTRACTION     Family History  Problem Relation Age of Onset  . Diabetes Mother   . High Cholesterol Mother   . Diabetes Father   . Food Allergy Father   . High Cholesterol Father   . Heart attack Father   . Hypertension Father   . Diabetes Maternal Grandmother   . Colon polyps Maternal Grandmother   . Diabetes Maternal Grandfather   . Colon polyps Maternal Grandfather   . Heart disease Maternal Grandfather   . Colon cancer Paternal Grandmother   . Heart disease Paternal Grandfather   . Heart failure Other        Grandfather  . Heart attack Other        x's 4 Grandfather  . Stroke Other        x's 1 Grandfather  . Diabetes Other   . Diabetes Maternal Aunt   . Allergic rhinitis Daughter   . Eczema Daughter   . Anxiety disorder Daughter   . Lactose intolerance Daughter   . OCD Daughter   . Anxiety disorder Daughter   . Lactose intolerance Daughter   . Anxiety disorder Daughter   . Asthma Daughter   . Lactose intolerance Daughter   . Angioedema Neg Hx   . Immunodeficiency Neg Hx    Social History   Socioeconomic History  . Marital status: Married    Spouse name: Not on file  . Number of children: Not on  file  . Years of education: Not on file  . Highest education level: Not on file  Occupational History  . Not on file  Tobacco Use  . Smoking status: Former Smoker    Packs/day: 1.00    Years: 11.00    Pack years: 11.00    Types: Cigarettes    Quit date: 05/07/1997    Years since quitting: 23.3  . Smokeless tobacco: Never Used  . Tobacco comment: no E cigs  Vaping Use  . Vaping Use: Never used  Substance and Sexual Activity  . Alcohol use: No    Alcohol/week: 0.0 standard drinks  . Drug  use: No  . Sexual activity: Yes  Other Topics Concern  . Not on file  Social History Narrative  . Not on file   Social Determinants of Health   Financial Resource Strain: Not on file  Food Insecurity: Not on file  Transportation Needs: Not on file  Physical Activity: Not on file  Stress: Not on file  Social Connections: Not on file  Intimate Partner Violence: Not on file   ROS-see HPI   + = positive Constitutional:    weight loss, night sweats, fevers, chills, fatigue, lassitude. HEENT:   + headaches, +difficulty swallowing, tooth/dental problems, +sore throat,       sneezing, itching, +ear ache, nasal congestion, post nasal drip, snoring CV:    +chest pain, orthopnea, PND, +swelling in lower extremities, anasarca,                                   dizziness, palpitations Resp:   +shortness of breath with exertion or at rest.               + productive cough,   +non-productive cough, coughing up of blood.              change in color of mucus.  wheezing.   Skin:    +rash or lesions. GI:  No-   heartburn, indigestion, abdominal pain, nausea, vomiting, diarrhea,                 change in bowel habits, loss of appetite GU: dysuria, change in color of urine, no urgency or frequency.   flank pain. MS:   +joint pain, stiffness, decreased range of motion, back pain. Neuro-     nothing unusual Psych:  change in mood or affect.  depression or anxiety.   memory loss.  OBJ- Physical  Exam General- Alert, Oriented, Affect-appropriate, Distress- none acute, + obese Skin- rash-none, lesions- none, excoriation- none Lymphadenopathy- none Head- atraumatic            Eyes- Gross vision intact, PERRLA, conjunctivae and secretions clear            Ears- Hearing, canals-normal            Nose- Clear, no-Septal dev, mucus, polyps, erosion, perforation             Throat- Mallampati IV , mucosa clear , drainage- none, tonsils- atrophic, + teeth Neck- flexible , trachea midline, no stridor , thyroid nl, carotid no bruit Chest - symmetrical excursion , unlabored           Heart/CV- +rapid RRR , no murmur , no gallop  , no rub, nl s1 s2                           - JVD- none , edema- none, stasis changes- none, varices- none           Lung- clear to P&A, wheeze- none, cough- none , dullness-none, rub- none           Chest wall-  Abd-  Br/ Gen/ Rectal- Not done, not indicated Extrem- cyanosis- none, clubbing, none, atrophy- none, strength- nl Neuro- grossly intact to observation

## 2020-08-31 ENCOUNTER — Encounter: Payer: Self-pay | Admitting: Internal Medicine

## 2020-08-31 ENCOUNTER — Other Ambulatory Visit: Payer: Self-pay

## 2020-08-31 ENCOUNTER — Ambulatory Visit (INDEPENDENT_AMBULATORY_CARE_PROVIDER_SITE_OTHER): Payer: BC Managed Care – PPO | Admitting: Internal Medicine

## 2020-08-31 VITALS — BP 140/100 | HR 129 | Temp 97.3°F | Ht 64.0 in | Wt 214.4 lb

## 2020-08-31 DIAGNOSIS — R5383 Other fatigue: Secondary | ICD-10-CM | POA: Insufficient documentation

## 2020-08-31 DIAGNOSIS — R5382 Chronic fatigue, unspecified: Secondary | ICD-10-CM

## 2020-08-31 DIAGNOSIS — G4734 Idiopathic sleep related nonobstructive alveolar hypoventilation: Secondary | ICD-10-CM | POA: Diagnosis not present

## 2020-08-31 DIAGNOSIS — R0683 Snoring: Secondary | ICD-10-CM | POA: Diagnosis not present

## 2020-08-31 NOTE — Assessment & Plan Note (Signed)
Not clear if this all ties together as sleep apnea or if there are components of depression, long Covid and other factors involved.  Plan- reconsider after sleep test.

## 2020-08-31 NOTE — Patient Instructions (Signed)
Order- schedule home sleep test    Dx snoring  Please call for results and recommendations about 2 weeks after your sleep test. If appropriate, we can start treatment before we see you next.

## 2020-08-31 NOTE — Assessment & Plan Note (Addendum)
Sleep study will allow assessment of sleep O2 levels and any variation in heart rate. Not certain how much of this may relate to covid infections. We discussed possibility of OSA, medical issues, testing, treatments for OSA if appropriate.  Plan- sleep study

## 2020-09-02 ENCOUNTER — Ambulatory Visit: Payer: BC Managed Care – PPO | Admitting: Family Medicine

## 2020-09-02 ENCOUNTER — Other Ambulatory Visit: Payer: Self-pay

## 2020-09-02 ENCOUNTER — Encounter: Payer: Self-pay | Admitting: Family Medicine

## 2020-09-02 VITALS — HR 131 | Temp 97.7°F | Resp 20 | Ht 64.0 in | Wt 215.4 lb

## 2020-09-02 DIAGNOSIS — U099 Post covid-19 condition, unspecified: Secondary | ICD-10-CM | POA: Diagnosis not present

## 2020-09-02 DIAGNOSIS — R000 Tachycardia, unspecified: Secondary | ICD-10-CM | POA: Diagnosis not present

## 2020-09-02 DIAGNOSIS — G4734 Idiopathic sleep related nonobstructive alveolar hypoventilation: Secondary | ICD-10-CM

## 2020-09-02 DIAGNOSIS — R0600 Dyspnea, unspecified: Secondary | ICD-10-CM | POA: Diagnosis not present

## 2020-09-02 DIAGNOSIS — G909 Disorder of the autonomic nervous system, unspecified: Secondary | ICD-10-CM

## 2020-09-02 DIAGNOSIS — R0609 Other forms of dyspnea: Secondary | ICD-10-CM

## 2020-09-02 MED ORDER — METOPROLOL SUCCINATE ER 25 MG PO TB24
25.0000 mg | ORAL_TABLET | Freq: Every day | ORAL | 3 refills | Status: DC
Start: 2020-09-02 — End: 2020-09-27

## 2020-09-02 NOTE — Patient Instructions (Signed)
Follow up after the Neuromuscular specialist, cardiology, sleep study START the Metoprolol daily Message/harrass [facility] b/c I agree w/ the neuromuscular eval Call with any questions or concerns Hang in there!!!

## 2020-09-02 NOTE — Progress Notes (Signed)
   Subjective:    Patient ID: Abigail Singh, female    DOB: 1975-07-07, 45 y.o.   MRN: [id number]  HPI Long COVID- pt reports she is making improvements in her cough.  She feels BP is worse.  Fatigue is unchanged.  Had pulmonary appt 2 days ago- O2 levels continue to drop both while sleeping and during the day per her home monitoring.  Sleep study pending.  She saw Rheum and has appt on 5/17 to review results.  Was receiving voice therapy but this is now on hold b/c they have concerns there is a neurologic issue.  They want her to meet w/ neuromuscular specialist.  She is having a difficult time scheduling with their office.  Continues to have hoarseness and weak voice after talking.  HR continues to be elevated- can go as high as 170 when walking.  Pt reports HR will be 'really really high and then it just drops'.  HR ranging 42-170.  This causes dizziness.  Still having chills, hot flashes, fevers, intermittent rashes.  + hair loss.  sxs tend to be L sided- tongue tremor, rashes, hand tremor, vision loss, neuropathy.  She desperately wants to return to 'normal'- including work.  Daughters are in therapy to deal w/ mom's illness/limitations.  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  Has seen- Neuro Pulm ENT Speech Rheum   Review of Systems For ROS see HPI   This visit occurred during the SARS-CoV-2 public health emergency.  Safety protocols were in place, including screening questions prior to the visit, additional usage of staff PPE, and extensive cleaning of exam room while observing appropriate contact time as indicated for disinfecting solutions.       Objective:   Physical Exam Vitals reviewed.  Constitutional:      General: She is not in acute distress.    Appearance: She is obese. She is ill-appearing.  HENT:     Head: Normocephalic and atraumatic.  Eyes:     Extraocular Movements: Extraocular movements  intact.     Conjunctiva/sclera: Conjunctivae normal.     Pupils: Pupils are equal, round, and reactive to light.  Cardiovascular:     Rate and Rhythm: Tachycardia present.     Pulses: Normal pulses.  Pulmonary:     Effort: No respiratory distress.     Comments: Unable to speak more than 2-3 sentences w/o hacking cough SOB while wearing masks, with minimal exertion, and speaking in paragraphs Musculoskeletal:     Cervical back: Neck supple.  Lymphadenopathy:     Cervical: No cervical adenopathy.  Skin:    General: Skin is warm and dry.  Neurological:     Mental Status: She is alert and oriented to person, place, and time.     Comments: Abnormal speech hoarseness  Psychiatric:     Comments: Anxious, appropriately frustrated/depressed           Assessment & Plan:

## 2020-09-03 ENCOUNTER — Other Ambulatory Visit: Payer: Self-pay | Admitting: Internal Medicine

## 2020-09-07 ENCOUNTER — Ambulatory Visit (INDEPENDENT_AMBULATORY_CARE_PROVIDER_SITE_OTHER): Payer: BC Managed Care – PPO | Admitting: Psychology

## 2020-09-07 ENCOUNTER — Ambulatory Visit: Payer: BC Managed Care – PPO | Admitting: Rheumatology

## 2020-09-07 DIAGNOSIS — F4310 Post-traumatic stress disorder, unspecified: Secondary | ICD-10-CM | POA: Diagnosis not present

## 2020-09-08 NOTE — Assessment & Plan Note (Signed)
Ongoing issue.  Sleep study pending.  Pt likely needs nightly O2 to improve sxs.  Will continue to follow.

## 2020-09-08 NOTE — Assessment & Plan Note (Signed)
Deteriorated.  HR can go as high as 170 without exertion.  This is clearly uncomfortable when it occurs.  Will refer to cardiology and start Metoprolol for symptom control.  Pt expressed understanding and is in agreement w/ plan.

## 2020-09-08 NOTE — Assessment & Plan Note (Addendum)
Ongoing issue for pt.  She continues to struggle w/ SOB, cough, fevers, fatigue, brain fog, tremors, speech abnormality, hoarseness, hair loss, autonomic instability, dizziness, vision loss.  She is currently following w/ multiple specialists and attempting to return to a functional baseline.  She is understandably frustrated/distraught both at how she is feeling and how it is impacting her family and her job.  She is clearly unable to work given her inability to speak in paragraph form w/o SOB, severe hoarseness.  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  She has financial concerns regarding medical bills and lack of paycheck.  Will continue to try and coordinate specialty appts and ancillary services

## 2020-09-08 NOTE — Assessment & Plan Note (Signed)
Ongoing issue for pt and she continues to have nocturnal hypoxia.  Sleep study pending.  Continues to work with pulm.  Will refer to cards for additional evaluation.

## 2020-09-08 NOTE — Assessment & Plan Note (Signed)
New.  Pt's HR and BP are labile and she is symptomatic.  She develops palpitations, diaphoresis, dizziness.  Start Metoprolol.  Refer to Cards for complete evaluation.  Pt expressed understanding and is in agreement w/ plan.

## 2020-09-09 NOTE — Progress Notes (Signed)
Office Visit Note  Patient: Abigail Singh             Date of Birth: 05/12/1975           MRN: [id number]             PCP: Midge Minium, MD Referring: Midge Minium, MD Visit Date: 09/20/2020 Occupation: '@GUAROCC' @  Subjective:  Fatigue and positive ANA.   History of Present Illness: Abigail Singh is a 45 y.o. female with history of positive ANA and fatigue.  She states she continues to have fatigue.  She has oral ulcers which are not as frequent now.  She has some generalized pain and discomfort from fibromyalgia.  She has not noticed any joint swelling.  She has not noted any improvement in her hair loss.  She denies any recent history of photosensitivity.  She has mild Raynaud's which gets worse during the winter months.  Shortness of breath is gradually improving.  Activities of Daily Living:  Patient reports morning stiffness for 0 minutes.   Patient Reports nocturnal pain.  Difficulty dressing/grooming: Denies Difficulty climbing stairs: Denies Difficulty getting out of chair: Denies Difficulty using hands for taps, buttons, cutlery, and/or writing: Denies  Review of Systems  Constitutional: Positive for fatigue. Negative for night sweats, weight gain and weight loss.  HENT: Positive for mouth sores. Negative for trouble swallowing, trouble swallowing, mouth dryness and nose dryness.   Eyes: Negative for pain, redness, itching, visual disturbance and dryness.  Respiratory: Positive for shortness of breath. Negative for cough and difficulty breathing.   Cardiovascular: Negative for chest pain, palpitations, hypertension, irregular heartbeat and swelling in legs/feet.  Gastrointestinal: Negative for blood in stool, constipation and diarrhea.  Endocrine: Negative for increased urination.  Genitourinary: Negative for difficulty urinating and vaginal dryness.  Musculoskeletal: Positive for arthralgias and joint pain. Negative for joint swelling, myalgias, muscle  weakness, morning stiffness, muscle tenderness and myalgias.  Skin: Positive for rash. Negative for color change, hair loss, redness, skin tightness, ulcers and sensitivity to sunlight.       On scalp and behind ears.   Allergic/Immunologic: Positive for susceptible to infections.  Neurological: Positive for dizziness, tremors, numbness, headaches and memory loss. Negative for night sweats and weakness.  Hematological: Positive for bruising/bleeding tendency. Negative for swollen glands.  Psychiatric/Behavioral: Positive for sleep disturbance. Negative for depressed mood and confusion. The patient is not nervous/anxious.     PMFS History:  Patient Active Problem List   Diagnosis Date Noted  . Tachycardia 09/02/2020  . Autonomic instability 09/02/2020  . Nocturnal hypoxemia 08/31/2020  . Fatigue 08/31/2020  . DOE (dyspnea on exertion) 07/08/2020  . Alteration in speech 06/28/2020  . Depression 06/15/2020  . Chronic cough 05/31/2020  . Long COVID 05/31/2020  . 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 Seasonal allergic conjunctivitis 06/24/2018  . Other allergic rhinitis 06/24/2018  . Other dysphagia 06/24/2018  . Fibromyalgia 12/05/2017  . Vitamin D deficiency 11/26/2016  . Insomnia 11/26/2016  . ADHD 11/26/2016  . Obesity (BMI 30-39.9) 01/27/2016  . Migraine 12/29/2014  . Plantar fasciitis, bilateral 11/01/2014  . Sialoadenitis 10/21/2013  . Physical exam 10/23/2010  . FIBROMYALGIA 11/09/2009  . Thyroid nodule 10/21/2009    Past Medical History:  Diagnosis Date  . Allergy    sseasonal  . Anemia    as teenager  . Anxiety   . Depression   . Fibroid   . Fibromyalgia   . Heartburn in pregnancy   .  History of fibromyalgia   . Migraines   . Neuromuscular disorder (HCC)    fibromyalgia  . Postpartum care following cesarean delivery 03/17/2012  . Preterm labor   . Status post cesarean delivery (repeat x 2) 03/17/2012  . Urinary tract infection      Family History  Problem Relation Age of Onset  . Diabetes Mother   . High Cholesterol Mother   . Diabetes Father   . Food Allergy Father   . High Cholesterol Father   . Heart attack Father   . Hypertension Father   . Diabetes Maternal Grandmother   . Colon polyps Maternal Grandmother   . Diabetes Maternal Grandfather   . Colon polyps Maternal Grandfather   . Heart disease Maternal Grandfather   . Colon cancer Paternal Grandmother   . Heart disease Paternal Grandfather   . Heart failure Other        Grandfather  . Heart attack Other        x's 4 Grandfather  . Stroke Other        x's 1 Grandfather  . Diabetes Other   . Diabetes Maternal Aunt   . Allergic rhinitis Daughter   . Eczema Daughter   . Anxiety disorder Daughter   . Lactose intolerance Daughter   . OCD Daughter   . Anxiety disorder Daughter   . Lactose intolerance Daughter   . Anxiety disorder Daughter   . Asthma Daughter   . Lactose intolerance Daughter   . Angioedema Neg Hx   . Immunodeficiency Neg Hx    Past Surgical History:  Procedure Laterality Date  . abnormal cells in colon/tatoo area     2012  . CESAREAN SECTION  2009  . CESAREAN SECTION  03/17/2012   Procedure: CESAREAN SECTION;  Surgeon: Marvene Staff, MD;  Location: [facility];  Service: Obstetrics;  Laterality: N/A;  Repeat C/S.  EDD: 03/23/12  . COLONOSCOPY    . DILATION AND CURETTAGE OF UTERUS    . POLYPECTOMY    . tubes in ears    . WISDOM TOOTH EXTRACTION     Social History   Social History Narrative  . Not on file   Immunization History  Administered Date(s) Administered  . DTP 03/16/1976, 05/18/1976, 07/20/1976, 07/12/1977, 03/16/1981  . Influenza,inj,Quad PF,6+ Mos 07/01/2015, 01/27/2016, 01/23/2017, 01/20/2018, 01/03/2019, 02/14/2020  . MMR 05/17/1977, 12/22/1987  . OPV 03/16/1976, 05/18/1976, 08/01/1977, 03/16/1981  . PFIZER(Purple Top)SARS-COV-2 Vaccination 07/02/2019, 07/29/2019, 02/28/2020  . Td 10/31/1993  . Tdap  11/01/2011     Objective: Vital Signs: BP (!) 129/94 (BP Location: Left Arm, Patient Position: Sitting, Cuff Size: Normal)   Pulse (!) 105   Ht 5' 3.5" (1.613 m)   Wt 217 lb 6.4 oz (98.6 kg)   BMI 37.91 kg/m    Physical Exam Vitals and nursing note reviewed.  Constitutional:      Appearance: She is well-developed.  HENT:     Head: Normocephalic and atraumatic.  Eyes:     Conjunctiva/sclera: Conjunctivae normal.  Cardiovascular:     Rate and Rhythm: Normal rate and regular rhythm.     Heart sounds: Normal heart sounds.  Pulmonary:     Effort: Pulmonary effort is normal.     Breath sounds: Normal breath sounds.  Abdominal:     General: Bowel sounds are normal.     Palpations: Abdomen is soft.  Musculoskeletal:     Cervical back: Normal range of motion.  Lymphadenopathy:     Cervical: No cervical adenopathy.  Skin:    General: Skin is warm and dry.     Capillary Refill: Capillary refill takes less than 2 seconds.  Neurological:     Mental Status: She is alert and oriented to person, place, and time.  Psychiatric:        Behavior: Behavior normal.      Musculoskeletal Exam: C-spine, thoracic and lumbar spine with good range of motion.  Shoulder joints, elbow joints, wrist joints, MCPs PIPs and DIPs with good range of motion with no synovitis.  Hip joints, knee joints, ankles, MTPs and PIPs with good range of motion with no synovitis.  She has hypermobility in almost all of her joints.  She also has positive tender points.  CDAI Exam: CDAI Score: -- Patient Global: --; Provider Global: -- Swollen: --; Tender: -- Joint Exam 09/20/2020   No joint exam has been documented for this visit   There is currently no information documented on the homunculus. Go to the Rheumatology activity and complete the homunculus joint exam.  Investigation: No additional findings.  Imaging: No results found.  Recent Labs: Lab Results  Component Value Date   WBC 9.4 07/08/2020    HGB 13.5 07/08/2020   PLT 318.0 07/08/2020   NA 140 07/08/2020   K 4.0 07/08/2020   CL 106 07/08/2020   CO2 23 07/08/2020   GLUCOSE 90 07/08/2020   BUN 17 07/08/2020   CREATININE 0.82 07/08/2020   BILITOT 0.3 07/11/2020   ALKPHOS 82 07/11/2020   AST 20 07/11/2020   ALT 22 07/11/2020   PROT 7.1 07/11/2020   ALBUMIN 4.0 07/11/2020   CALCIUM 9.1 07/08/2020   GFRAA >60 09/24/2019    April 19, 2022AVISE lupus index -0.4, ANA positive, titer negative, ENA negative, CB CAP negative, Jo 1 negative, anticardiolipin negative, beta-2 GP 1 negative, antiphosphatidylserine negative, antihistone weak positive, RF negative, anti-CCP negative, anti-Car P-, antithyroglobulin negative, TPO negative  Speciality Comments: No specialty comments available.  Procedures:  No procedures performed Allergies: Aspirin, Citrus, Codeine, Eggs or egg-derived products, Erythromycin, Lactose intolerance (gi), Penicillins, and Sulfonamide derivatives   Assessment / Plan:     Visit Diagnoses: Positive ANA (antinuclear antibody) - AVISE lupus index -0.4, patient gives history of oral ulcers, hair loss, Raynaud's, photosensitivity, fatigue, lymphadenopathy.  Frequent antibiotic and prednisone use because lymphadenopathy.  I did not notice any lymphadenopathy on my examination.  She had no oral ulcers.  She has generalized hair thinning.  She has some dry scales behind the ears consistent with seborrhea.  Her oral ulcers and fatigue has improved.  She has not had any recent photosensitivity.  All autoimmune work-up is negative which was discussed with the patient at length.  Have advised her to contact me in case she develops new symptoms.  Sialoadenitis - History of recurrent parotid swelling and lymph node enlargement.  Patient is scheduled to have an appointment with ENT at [facility]  Chronic cough - History of chronic cough since March 2021 after COVID-19 infection.  She had voice therapy and evaluated by Dr. Melvyn Novas.   PFTs May 2021 were normal.  Recurrent sinusitis  History of gastroesophageal reflux (GERD) - Diagnosed by Dr. Melvyn Novas.  No improvement noted by patient on antireflux measures.  Hypermobility arthralgia  Fibromyalgia - Diagnosed in early 2000's.  Her symptoms got worse after COVID-19 infection.  I will refer her to integrative therapies.  Other insomnia  Vitamin D deficiency  Hx of migraines - Followed by Dr. Juleen China.  Coarse tremors - She will be  seeing a neurologist at [facility].  History of posttraumatic stress disorder (PTSD) - She has been seeing a Social worker.  History of depression  Thyroid nodule  Former smoker - Quit smoking in 2000.  She smoked half a pack per day for many years.  COVID-19 virus infection - March 2021 & 05/2020 with pneumonia.  Long COVID- chest tightness, SOB, palpitations.  She has been on medical leave since January 2022.  Orders: Orders Placed This Encounter  Procedures  . Ambulatory referral to Physical Therapy   No orders of the defined types were placed in this encounter.    Follow-Up Instructions: Return if symptoms worsen or fail to improve, for fatigue.   Bo Merino, MD  Note - This record has been created using Editor, commissioning.  Chart creation errors have been sought, but may not always  have been located. Such creation errors do not reflect on  the standard of medical care.

## 2020-09-11 ENCOUNTER — Other Ambulatory Visit: Payer: Self-pay | Admitting: Internal Medicine

## 2020-09-12 ENCOUNTER — Other Ambulatory Visit: Payer: Self-pay

## 2020-09-12 ENCOUNTER — Ambulatory Visit: Payer: BC Managed Care – PPO

## 2020-09-12 ENCOUNTER — Encounter: Payer: Self-pay | Admitting: Family Medicine

## 2020-09-12 ENCOUNTER — Other Ambulatory Visit: Payer: Self-pay | Admitting: Internal Medicine

## 2020-09-12 DIAGNOSIS — R0683 Snoring: Secondary | ICD-10-CM

## 2020-09-12 DIAGNOSIS — F900 Attention-deficit hyperactivity disorder, predominantly inattentive type: Secondary | ICD-10-CM

## 2020-09-12 MED ORDER — AMPHETAMINE-DEXTROAMPHET ER 20 MG PO CP24: 20.0000 mg | ORAL_CAPSULE | ORAL | 0 refills | Status: DC

## 2020-09-12 NOTE — Telephone Encounter (Signed)
LFD 08/11/20 #30 with no refills LOV 09/02/20 NOV 08/02/21

## 2020-09-13 NOTE — Telephone Encounter (Signed)
Pt asking about the 2nd page of a 703 form, pt attached copy of form if needed but unsure if maybe the fax was interrupted on send

## 2020-09-14 DIAGNOSIS — R0683 Snoring: Secondary | ICD-10-CM | POA: Diagnosis not present

## 2020-09-16 NOTE — Telephone Encounter (Signed)
I have picked up from the back, I have faxed to the # provided in the message below and sent a copy to the pt's email

## 2020-09-19 ENCOUNTER — Ambulatory Visit (INDEPENDENT_AMBULATORY_CARE_PROVIDER_SITE_OTHER): Payer: BC Managed Care – PPO | Admitting: Psychology

## 2020-09-19 ENCOUNTER — Telehealth: Payer: Self-pay | Admitting: Internal Medicine

## 2020-09-19 DIAGNOSIS — F4310 Post-traumatic stress disorder, unspecified: Secondary | ICD-10-CM | POA: Diagnosis not present

## 2020-09-19 NOTE — Telephone Encounter (Signed)
error 

## 2020-09-20 ENCOUNTER — Other Ambulatory Visit: Payer: Self-pay

## 2020-09-20 ENCOUNTER — Ambulatory Visit (INDEPENDENT_AMBULATORY_CARE_PROVIDER_SITE_OTHER): Payer: BC Managed Care – PPO | Admitting: Rheumatology

## 2020-09-20 ENCOUNTER — Encounter: Payer: Self-pay | Admitting: Rheumatology

## 2020-09-20 VITALS — BP 129/94 | HR 105 | Ht 63.5 in | Wt 217.4 lb

## 2020-09-20 DIAGNOSIS — R053 Chronic cough: Secondary | ICD-10-CM | POA: Diagnosis not present

## 2020-09-20 DIAGNOSIS — G4709 Other insomnia: Secondary | ICD-10-CM

## 2020-09-20 DIAGNOSIS — G252 Other specified forms of tremor: Secondary | ICD-10-CM

## 2020-09-20 DIAGNOSIS — K1120 Sialoadenitis, unspecified: Secondary | ICD-10-CM | POA: Diagnosis not present

## 2020-09-20 DIAGNOSIS — E559 Vitamin D deficiency, unspecified: Secondary | ICD-10-CM

## 2020-09-20 DIAGNOSIS — M797 Fibromyalgia: Secondary | ICD-10-CM

## 2020-09-20 DIAGNOSIS — J329 Chronic sinusitis, unspecified: Secondary | ICD-10-CM

## 2020-09-20 DIAGNOSIS — R768 Other specified abnormal immunological findings in serum: Secondary | ICD-10-CM

## 2020-09-20 DIAGNOSIS — Z8659 Personal history of other mental and behavioral disorders: Secondary | ICD-10-CM

## 2020-09-20 DIAGNOSIS — E041 Nontoxic single thyroid nodule: Secondary | ICD-10-CM

## 2020-09-20 DIAGNOSIS — Z8719 Personal history of other diseases of the digestive system: Secondary | ICD-10-CM

## 2020-09-20 DIAGNOSIS — Z8669 Personal history of other diseases of the nervous system and sense organs: Secondary | ICD-10-CM

## 2020-09-20 DIAGNOSIS — M2550 Pain in unspecified joint: Secondary | ICD-10-CM

## 2020-09-20 DIAGNOSIS — Z87891 Personal history of nicotine dependence: Secondary | ICD-10-CM

## 2020-09-20 DIAGNOSIS — U071 COVID-19: Secondary | ICD-10-CM

## 2020-09-21 ENCOUNTER — Ambulatory Visit: Payer: BC Managed Care – PPO | Admitting: Psychology

## 2020-09-26 DIAGNOSIS — T7840XA Allergy, unspecified, initial encounter: Secondary | ICD-10-CM | POA: Insufficient documentation

## 2020-09-26 DIAGNOSIS — O26899 Other specified pregnancy related conditions, unspecified trimester: Secondary | ICD-10-CM | POA: Insufficient documentation

## 2020-09-26 DIAGNOSIS — Z8739 Personal history of other diseases of the musculoskeletal system and connective tissue: Secondary | ICD-10-CM | POA: Insufficient documentation

## 2020-09-26 DIAGNOSIS — F419 Anxiety disorder, unspecified: Secondary | ICD-10-CM | POA: Insufficient documentation

## 2020-09-26 DIAGNOSIS — R12 Heartburn: Secondary | ICD-10-CM | POA: Insufficient documentation

## 2020-09-26 DIAGNOSIS — D219 Benign neoplasm of connective and other soft tissue, unspecified: Secondary | ICD-10-CM | POA: Insufficient documentation

## 2020-09-26 DIAGNOSIS — D649 Anemia, unspecified: Secondary | ICD-10-CM | POA: Insufficient documentation

## 2020-09-26 DIAGNOSIS — G43909 Migraine, unspecified, not intractable, without status migrainosus: Secondary | ICD-10-CM | POA: Insufficient documentation

## 2020-09-26 DIAGNOSIS — N390 Urinary tract infection, site not specified: Secondary | ICD-10-CM | POA: Insufficient documentation

## 2020-09-26 DIAGNOSIS — G709 Myoneural disorder, unspecified: Secondary | ICD-10-CM | POA: Insufficient documentation

## 2020-09-27 ENCOUNTER — Encounter: Payer: Self-pay | Admitting: Cardiology

## 2020-09-27 ENCOUNTER — Ambulatory Visit: Payer: BC Managed Care – PPO | Admitting: Cardiology

## 2020-09-27 ENCOUNTER — Other Ambulatory Visit: Payer: Self-pay

## 2020-09-27 VITALS — BP 130/96 | HR 111 | Ht 63.5 in | Wt 219.0 lb

## 2020-09-27 DIAGNOSIS — E7849 Other hyperlipidemia: Secondary | ICD-10-CM

## 2020-09-27 DIAGNOSIS — I10 Essential (primary) hypertension: Secondary | ICD-10-CM | POA: Diagnosis not present

## 2020-09-27 DIAGNOSIS — G909 Disorder of the autonomic nervous system, unspecified: Secondary | ICD-10-CM | POA: Diagnosis not present

## 2020-09-27 DIAGNOSIS — O99211 Obesity complicating pregnancy, first trimester: Secondary | ICD-10-CM

## 2020-09-27 DIAGNOSIS — R5383 Other fatigue: Secondary | ICD-10-CM

## 2020-09-27 MED ORDER — METOPROLOL SUCCINATE ER 50 MG PO TB24: 50.0000 mg | ORAL_TABLET | Freq: Every day | ORAL | 3 refills | Status: DC

## 2020-09-27 NOTE — Telephone Encounter (Signed)
CY pt is requesting the results of her sleep study.  She said that it did not load correctly in her chart but she needed explanation of these results.  Thanks

## 2020-09-27 NOTE — Progress Notes (Signed)
Cardiology Office Note:    Date:  09/27/2020   ID:  Abigail Singh, DOB 20-May-1975, MRN [id number]  PCP:  Midge Minium, MD  Cardiologist:  None  Electrophysiologist:  None   Referring MD: Midge Minium, MD   Chief Complaint  Patient presents with  . Tachycardia    History of Present Illness:    Abigail Singh is a 45 y.o. female with a hx of anxiety, history of COVID-19 infection and long COVID syndrome with autonomic dysfunction recently started on metoprolol due to persistent tachycardia, obesity, fibromyalgia, migraine headaches with vertigo, familial hyperlipidemia is here today to be evaluated for tachycardia and fatigue.  The patient is here today in the office with her sister).  She tells me that she had over the last several months been expressing significant palpitations.  She notes that since she had COVID and started having rapid heartbeats indwelling never resolved.  Thankfully recently her PCP started her on metoprolol succinate 25 mg daily this seems to have helped but has not really fully resolved.  Along with the palpitation she is getting significant fatigue.  Prior to the metoprolol she has had some chest pain and shortness of breath which had resolved since starting the medications.  She also follows with pulmonary, neurology as well as rheumatology.  She had a sleep study recently which was reported to be normal with no need for CPAP.  With her rheumatologist she has been referred to integrative medicine as most of her testing has been normal.   Past Medical History:  Diagnosis Date  . Allergy    sseasonal  . Anemia    as teenager  . Anxiety   . Depression   . Fibroid   . Fibromyalgia   . Heartburn in pregnancy   . History of fibromyalgia   . Migraines   . Neuromuscular disorder (HCC)    fibromyalgia  . Postpartum care following cesarean delivery 03/17/2012  . Preterm labor   . Status post cesarean delivery (repeat x 2) 03/17/2012  .  Urinary tract infection     Past Surgical History:  Procedure Laterality Date  . abnormal cells in colon/tatoo area     2012  . CESAREAN SECTION  2009  . CESAREAN SECTION  03/17/2012   Procedure: CESAREAN SECTION;  Surgeon: Marvene Staff, MD;  Location: [facility];  Service: Obstetrics;  Laterality: N/A;  Repeat C/S.  EDD: 03/23/12  . COLONOSCOPY    . DILATION AND CURETTAGE OF UTERUS    . POLYPECTOMY    . tubes in ears    . WISDOM TOOTH EXTRACTION      Current Medications: Current Meds  Medication Sig  . acetaminophen (TYLENOL) 325 MG tablet Take 650 mg by mouth every 6 (six) hours as needed for moderate pain.  Marland Kitchen albuterol (VENTOLIN HFA) 108 (90 Base) MCG/ACT inhaler Inhale 1 puff into the lungs as needed for wheezing or shortness of breath.  . amphetamine-dextroamphetamine (ADDERALL XR) 20 MG 24 hr capsule Take 1 capsule (20 mg total) by mouth every morning.  . Ascorbic Acid (VITAMIN C) 1000 MG tablet Take 1,000 mg by mouth daily.  Marland Kitchen BIOTIN PO Take by mouth daily.  . Budesonide-Formoterol Fumarate (SYMBICORT IN) Inhale 1 puff into the lungs as needed (shortness of breath).  . Calcium Carbonate-Vit D-Min (CALCIUM 1200 PO) Take 1 capsule by mouth daily. Vitamin C as well  . chlorpheniramine (CHLOR-TRIMETON) 4 MG tablet Take 4 mg by mouth every 4 (four) hours as  needed. 2AT BEDTIME  . EPINEPHrine 0.3 mg/0.3 mL IJ SOAJ injection Inject 0.3 mLs (0.3 mg total) into the muscle as needed for anaphylaxis.  . famotidine (PEPCID) 20 MG tablet Take 20 mg by mouth 2 (two) times daily.  Marland Kitchen gabapentin (NEURONTIN) 300 MG capsule Take 300 mg by mouth 4 (four) times daily.  Marland Kitchen ibuprofen (ADVIL) 200 MG tablet Take 400 mg by mouth as needed for moderate pain.  . medroxyPROGESTERone (DEPO-PROVERA) 150 MG/ML injection SMARTSIG:1 Milliliter(s) IM Every 12 Weeks  . metoprolol succinate (TOPROL-XL) 50 MG 24 hr tablet Take 1 tablet (50 mg total) by mouth daily. Take with or immediately following a meal.  .  MULTIPLE VITAMIN PO Take 1 capsule by mouth daily.  . pantoprazole (PROTONIX) 40 MG tablet TAKE 1 TABLET DAILY 30-60 MIN BEFORE 1ST MEAL OF THE DAY  . Probiotic Product (PROBIOTIC PO) Take by mouth daily.  . promethazine-dextromethorphan (PROMETHAZINE-DM) 6.25-15 MG/5ML syrup TAKE 5 MLS BY MOUTH 4 (FOUR) TIMES DAILY AS NEEDED. (Patient taking differently: Take 5 mLs by mouth 4 (four) times daily as needed for cough.)  . QUERCETIN PO Take by mouth daily.  . rizatriptan (MAXALT-MLT) 10 MG disintegrating tablet Take 1 tablet (10 mg total) by mouth 3 (three) times daily as needed for migraine. May repeat in 2 hours if needed  . traZODone (DESYREL) 50 MG tablet TAKE 0.5-1 TABLETS BY MOUTH AT BEDTIME AS NEEDED FOR SLEEP.  Marland Kitchen triamcinolone (NASACORT) 55 MCG/ACT AERO nasal inhaler Place 2 sprays into the nose daily.  . vitamin B-12 (CYANOCOBALAMIN) 500 MCG tablet Take 500 mcg by mouth daily.  . Zinc 30 MG TABS Take 30 mg by mouth daily.  . [DISCONTINUED] metoprolol succinate (TOPROL-XL) 25 MG 24 hr tablet Take 1 tablet (25 mg total) by mouth daily.     Allergies:   Aspirin, Citrus, Codeine, Eggs or egg-derived products, Erythromycin, Lactose intolerance (gi), Penicillins, and Sulfonamide derivatives   Social History   Socioeconomic History  . Marital status: Married    Spouse name: Not on file  . Number of children: Not on file  . Years of education: Not on file  . Highest education level: Not on file  Occupational History  . Not on file  Tobacco Use  . Smoking status: Former Smoker    Packs/day: 1.00    Years: 11.00    Pack years: 11.00    Types: Cigarettes    Quit date: 05/07/1997    Years since quitting: 23.4  . Smokeless tobacco: Never Used  . Tobacco comment: no E cigs  Vaping Use  . Vaping Use: Never used  Substance and Sexual Activity  . Alcohol use: No    Alcohol/week: 0.0 standard drinks  . Drug use: No  . Sexual activity: Yes  Other Topics Concern  . Not on file  Social  History Narrative  . Not on file   Social Determinants of Health   Financial Resource Strain: Not on file  Food Insecurity: Not on file  Transportation Needs: Not on file  Physical Activity: Not on file  Stress: Not on file  Social Connections: Not on file     Family History: The patient's family history includes Allergic rhinitis in her daughter; Anxiety disorder in her daughter, daughter, and daughter; Asthma in her daughter; Colon cancer in her paternal grandmother; Colon polyps in her maternal grandfather and maternal grandmother; Diabetes in her father, maternal aunt, maternal grandfather, maternal grandmother, mother, and another family member; Eczema in her daughter; Food Allergy  in her father; Heart attack in her father and another family member; Heart disease in her maternal grandfather and paternal grandfather; Heart failure in an other family member; High Cholesterol in her father and mother; Hypertension in her father; Lactose intolerance in her daughter, daughter, and daughter; OCD in her daughter; Stroke in an other family member. There is no history of Angioedema or Immunodeficiency.  ROS:   Review of Systems  Constitution: Negative for decreased appetite, fever and weight gain.  HENT: Negative for congestion, ear discharge, hoarse voice and sore throat.   Eyes: Negative for discharge, redness, vision loss in right eye and visual halos.  Cardiovascular: Negative for chest pain, dyspnea on exertion, leg swelling, orthopnea and palpitations.  Respiratory: Negative for cough, hemoptysis, shortness of breath and snoring.   Endocrine: Negative for heat intolerance and polyphagia.  Hematologic/Lymphatic: Negative for bleeding problem. Does not bruise/bleed easily.  Skin: Negative for flushing, nail changes, rash and suspicious lesions.  Musculoskeletal: Negative for arthritis, joint pain, muscle cramps, myalgias, neck pain and stiffness.  Gastrointestinal: Negative for abdominal  pain, bowel incontinence, diarrhea and excessive appetite.  Genitourinary: Negative for decreased libido, genital sores and incomplete emptying.  Neurological: Negative for brief paralysis, focal weakness, headaches and loss of balance.  Psychiatric/Behavioral: Negative for altered mental status, depression and suicidal ideas.  Allergic/Immunologic: Negative for HIV exposure and persistent infections.    EKGs/Labs/Other Studies Reviewed:    The following studies were reviewed today:   EKG:  The ekg ordered today demonstrates sinus tachycardia, heart rate 103 bpm  Recent Labs: 07/08/2020: BUN 17; Creatinine, Ser 0.82; Hemoglobin 13.5; Platelets 318.0; Potassium 4.0; Sodium 140; TSH 1.58 07/11/2020: ALT 22  Recent Lipid Panel    Component Value Date/Time   CHOL 193 07/11/2020 0813   TRIG 132.0 07/11/2020 0813   HDL 42.70 07/11/2020 0813   CHOLHDL 5 07/11/2020 0813   VLDL 26.4 07/11/2020 0813   LDLCALC 124 (H) 07/11/2020 0813    Physical Exam:    VS:  BP (!) 130/96 (BP Location: Left Arm, Patient Position: Sitting, Cuff Size: Large)   Pulse (!) 111   Ht 5' 3.5" (1.613 m)   Wt 219 lb (99.3 kg)   SpO2 98%   BMI 38.19 kg/m     Wt Readings from Last 3 Encounters:  09/27/20 219 lb (99.3 kg)  09/20/20 217 lb 6.4 oz (98.6 kg)  09/02/20 215 lb 6.4 oz (97.7 kg)     GEN: Well nourished, well developed in no acute distress HEENT: Normal NECK: No JVD; No carotid bruits LYMPHATICS: No lymphadenopathy CARDIAC: S1S2 noted,RRR, no murmurs, rubs, gallops RESPIRATORY:  Clear to auscultation without rales, wheezing or rhonchi  ABDOMEN: Soft, non-tender, non-distended, +bowel sounds, no guarding. EXTREMITIES: No edema, No cyanosis, no clubbing MUSCULOSKELETAL:  No deformity  SKIN: Warm and dry NEUROLOGIC:  Alert and oriented x 3, non-focal PSYCHIATRIC:  Normal affect, good insight  ASSESSMENT:    1. Fatigue, unspecified type   2. Morbid obesity ([place])   3. Autonomic dysfunction   4.  Hypertension, unspecified type   5. Obesity complicating peripregnancy, antepartum, first trimester   6. Familial hyperlipidemia    PLAN:     Her tachycardia has improved since been started on metoprolol succinate 25 mg she still is having these palpitations what I like to do is increase her Lasix to 50 mg daily and see if this improves. In terms of her fatigue I will order an echocardiogram to assess her LV function as well as  any valvular abnormalities.  She has been recently ruled out for sleep apnea as her sleep study was negative.  She has some diastolic hypertension in the office today.  The patient tells me that at home her blood pressure has been running high.  I have advised the patient about some modifications in her life cutting back on salt and appropriate timing for monitoring her blood pressure.  She will take her blood pressure daily and send this information to me in the next week or 2 via MyChart.  In the meantime I have increased her metoprolol succinate which may not really help with her blood pressure as it would with her heart rate.  She may need to have an additional antihypertensive at which consider starting her on low-dose hydrochlorothiazide given she also does have some leg edema in the setting of her venous insufficiency.  We talked about her hyperlipidemia which I suspect may be familial hyperlipidemia.  She has been treating this with diet modification.  She plans to get a follow-up blood test with her PCP and then discussions will be if needed for lipid-lowering agents.  The patient understands the need to lose weight with diet and exercise. We have discussed specific strategies for this.  We discussed also possibly referring the patient to healthy weight and wellness/medical weight management program within [facility] health but she for now is starting her integrative medicine program as well as her physical therapy she intends to hold off and we will revisit it at another  time  The patient is in agreement with the above plan. The patient left the office in stable condition.  The patient will follow up in 8 weeks, Virtual visit.   Medication Adjustments/Labs and Tests Ordered: Current medicines are reviewed at length with the patient today.  Concerns regarding medicines are outlined above.  Orders Placed This Encounter  Procedures  . EKG 12-Lead  . ECHOCARDIOGRAM COMPLETE   Meds ordered this encounter  Medications  . metoprolol succinate (TOPROL-XL) 50 MG 24 hr tablet    Sig: Take 1 tablet (50 mg total) by mouth daily. Take with or immediately following a meal.    Dispense:  90 tablet    Refill:  3    Patient Instructions   Medication Instructions:  Your physician has recommended you make the following change in your medication:  INCREASE: Metoprolol 50 mg once daily  PLEASE TAKE YOUR BLOOD PRESSURE AND SEND IT TO ME IN 1 WEEK.  *If you need a refill on your cardiac medications before your next appointment, please call your pharmacy*   Lab Work: None If you have labs (blood work) drawn today and your tests are completely normal, you will receive your results only by: Marland Kitchen MyChart Message (if you have MyChart) OR . A paper copy in the mail If you have any lab test that is abnormal or we need to change your treatment, we will call you to review the results.   Testing/Procedures: Your physician has requested that you have an echocardiogram. Echocardiography is a painless test that uses sound waves to create images of your heart. It provides your doctor with information about the size and shape of your heart and how well your heart's chambers and valves are working. This procedure takes approximately one hour. There are no restrictions for this procedure.   Follow-Up: At [facility], you and your health needs are our priority.  As part of our continuing mission to provide you with exceptional heart care, we have  created designated Provider Care  Teams.  These Care Teams include your primary Cardiologist (physician) and Advanced Practice Providers (APPs -  Physician Assistants and Nurse Practitioners) who all work together to provide you with the care you need, when you need it.  We recommend signing up for the patient portal called "MyChart".  Sign up information is provided on this After Visit Summary.  MyChart is used to connect with patients for Virtual Visits (Telemedicine).  Patients are able to view lab/test results, encounter notes, upcoming appointments, etc.  Non-urgent messages can be sent to your provider as well.   To learn more about what you can do with MyChart, go to [email or web address].    Your next appointment:   8 week(s)  The format for your next appointment:   Virtual Visit   Provider:   Berniece Salines, DO   Other Instructions  Echocardiogram An echocardiogram is a test that uses sound waves (ultrasound) to produce images of the heart. Images from an echocardiogram can provide important information about:  Heart size and shape.  The size and thickness and movement of your heart's walls.  Heart muscle function and strength.  Heart valve function or if you have stenosis. Stenosis is when the heart valves are too narrow.  If blood is flowing backward through the heart valves (regurgitation).  A tumor or infectious growth around the heart valves.  Areas of heart muscle that are not working well because of poor blood flow or injury from a heart attack.  Aneurysm detection. An aneurysm is a weak or damaged part of an artery wall. The wall bulges out from the normal force of blood pumping through the body. Tell a health care provider about:  Any allergies you have.  All medicines you are taking, including vitamins, herbs, eye drops, creams, and over-the-counter medicines.  Any blood disorders you have.  Any surgeries you have had.  Any medical conditions you have.  Whether you are pregnant or may  be pregnant. What are the risks? Generally, this is a safe test. However, problems may occur, including an allergic reaction to dye (contrast) that may be used during the test. What happens before the test? No specific preparation is needed. You may eat and drink normally. What happens during the test?  You will take off your clothes from the waist up and put on a hospital gown.  Electrodes or electrocardiogram (ECG)patches may be placed on your chest. The electrodes or patches are then connected to a device that monitors your heart rate and rhythm.  You will lie down on a table for an ultrasound exam. A gel will be applied to your chest to help sound waves pass through your skin.  A handheld device, called a transducer, will be pressed against your chest and moved over your heart. The transducer produces sound waves that travel to your heart and bounce back (or "echo" back) to the transducer. These sound waves will be captured in real-time and changed into images of your heart that can be viewed on a video monitor. The images will be recorded on a computer and reviewed by your health care provider.  You may be asked to change positions or hold your breath for a short time. This makes it easier to get different views or better views of your heart.  In some cases, you may receive contrast through an IV in one of your veins. This can improve the quality of the pictures from your heart. The procedure may vary among  health care providers and hospitals.   What can I expect after the test? You may return to your normal, everyday life, including diet, activities, and medicines, unless your health care provider tells you not to do that. Follow these instructions at home:  It is up to you to get the results of your test. Ask your health care provider, or the department that is doing the test, when your results will be ready.  Keep all follow-up visits. This is important. Summary  An echocardiogram  is a test that uses sound waves (ultrasound) to produce images of the heart.  Images from an echocardiogram can provide important information about the size and shape of your heart, heart muscle function, heart valve function, and other possible heart problems.  You do not need to do anything to prepare before this test. You may eat and drink normally.  After the echocardiogram is completed, you may return to your normal, everyday life, unless your health care provider tells you not to do that. This information is not intended to replace advice given to you by your health care provider. Make sure you discuss any questions you have with your health care provider. Document Revised: 12/15/2019 Document Reviewed: 12/15/2019 Elsevier Patient Education  2021 [place].      Adopting a Healthy Lifestyle.  Know what a healthy weight is for you (roughly BMI <25) and aim to maintain this   Aim for 7+ servings of fruits and vegetables daily   65-80+ fluid ounces of water or unsweet tea for healthy kidneys   Limit to max 1 drink of alcohol per day; avoid smoking/tobacco   Limit animal fats in diet for cholesterol and heart health - choose grass fed whenever available   Avoid highly processed foods, and foods high in saturated/trans fats   Aim for low stress - take time to unwind and care for your mental health   Aim for 150 min of moderate intensity exercise weekly for heart health, and weights twice weekly for bone health   Aim for 7-9 hours of sleep daily   When it comes to diets, agreement about the perfect plan isnt easy to find, even among the experts. Experts at the [employer] developed an idea known as the Healthy Eating Plate. Just imagine a plate divided into logical, healthy portions.   The emphasis is on diet quality:   Load up on vegetables and fruits - one-half of your plate: Aim for color and variety, and remember that potatoes dont count.   Go  for whole grains - one-quarter of your plate: Whole wheat, barley, wheat berries, quinoa, oats, brown rice, and foods made with them. If you want pasta, go with whole wheat pasta.   Protein power - one-quarter of your plate: Fish, chicken, beans, and nuts are all healthy, versatile protein sources. Limit red meat.   The diet, however, does go beyond the plate, offering a few other suggestions.   Use healthy plant oils, such as olive, canola, soy, corn, sunflower and peanut. Check the labels, and avoid partially hydrogenated oil, which have unhealthy trans fats.   If youre thirsty, drink water. Coffee and tea are good in moderation, but skip sugary drinks and limit milk and dairy products to one or two daily servings.   The type of carbohydrate in the diet is more important than the amount. Some sources of carbohydrates, such as vegetables, fruits, whole grains, and beans-are healthier than others.   Finally, stay active  Rolly Pancake, DO  09/27/2020 8:09 PM    [facility]

## 2020-09-27 NOTE — Patient Instructions (Addendum)
Medication Instructions:  Your physician has recommended you make the following change in your medication:  INCREASE: Metoprolol 50 mg once daily  PLEASE TAKE YOUR BLOOD PRESSURE AND SEND IT TO ME IN 1 WEEK.  *If you need a refill on your cardiac medications before your next appointment, please call your pharmacy*   Lab Work: None If you have labs (blood work) drawn today and your tests are completely normal, you will receive your results only by: [facility] MyChart Message (if you have MyChart) OR . A paper copy in the mail If you have any lab test that is abnormal or we need to change your treatment, we will call you to review the results.   Testing/Procedures: Your physician has requested that you have an echocardiogram. Echocardiography is a painless test that uses sound waves to create images of your heart. It provides your doctor with information about the size and shape of your heart and how well your heart's chambers and valves are working. This procedure takes approximately one hour. There are no restrictions for this procedure.   Follow-Up: At [facility], you and your health needs are our priority.  As part of our continuing mission to provide you with exceptional heart care, we have created designated Provider Care Teams.  These Care Teams include your primary Cardiologist (physician) and Advanced Practice Providers (APPs -  Physician Assistants and Nurse Practitioners) who all work together to provide you with the care you need, when you need it.  We recommend signing up for the patient portal called "MyChart".  Sign up information is provided on this After Visit Summary.  MyChart is used to connect with patients for Virtual Visits (Telemedicine).  Patients are able to view lab/test results, encounter notes, upcoming appointments, etc.  Non-urgent messages can be sent to your provider as well.   To learn more about what you can do with MyChart, go to [email or web address].    Your  next appointment:   8 week(s)  The format for your next appointment:   Virtual Visit   Provider:   Berniece Salines, DO   Other Instructions  Echocardiogram An echocardiogram is a test that uses sound waves (ultrasound) to produce images of the heart. Images from an echocardiogram can provide important information about:  Heart size and shape.  The size and thickness and movement of your heart's walls.  Heart muscle function and strength.  Heart valve function or if you have stenosis. Stenosis is when the heart valves are too narrow.  If blood is flowing backward through the heart valves (regurgitation).  A tumor or infectious growth around the heart valves.  Areas of heart muscle that are not working well because of poor blood flow or injury from a heart attack.  Aneurysm detection. An aneurysm is a weak or damaged part of an artery wall. The wall bulges out from the normal force of blood pumping through the body. Tell a health care provider about:  Any allergies you have.  All medicines you are taking, including vitamins, herbs, eye drops, creams, and over-the-counter medicines.  Any blood disorders you have.  Any surgeries you have had.  Any medical conditions you have.  Whether you are pregnant or may be pregnant. What are the risks? Generally, this is a safe test. However, problems may occur, including an allergic reaction to dye (contrast) that may be used during the test. What happens before the test? No specific preparation is needed. You may eat and drink normally. What happens during  the test?  You will take off your clothes from the waist up and put on a hospital gown.  Electrodes or electrocardiogram (ECG)patches may be placed on your chest. The electrodes or patches are then connected to a device that monitors your heart rate and rhythm.  You will lie down on a table for an ultrasound exam. A gel will be applied to your chest to help sound waves pass  through your skin.  A handheld device, called a transducer, will be pressed against your chest and moved over your heart. The transducer produces sound waves that travel to your heart and bounce back (or "echo" back) to the transducer. These sound waves will be captured in real-time and changed into images of your heart that can be viewed on a video monitor. The images will be recorded on a computer and reviewed by your health care provider.  You may be asked to change positions or hold your breath for a short time. This makes it easier to get different views or better views of your heart.  In some cases, you may receive contrast through an IV in one of your veins. This can improve the quality of the pictures from your heart. The procedure may vary among health care providers and hospitals.   What can I expect after the test? You may return to your normal, everyday life, including diet, activities, and medicines, unless your health care provider tells you not to do that. Follow these instructions at home:  It is up to you to get the results of your test. Ask your health care provider, or the department that is doing the test, when your results will be ready.  Keep all follow-up visits. This is important. Summary  An echocardiogram is a test that uses sound waves (ultrasound) to produce images of the heart.  Images from an echocardiogram can provide important information about the size and shape of your heart, heart muscle function, heart valve function, and other possible heart problems.  You do not need to do anything to prepare before this test. You may eat and drink normally.  After the echocardiogram is completed, you may return to your normal, everyday life, unless your health care provider tells you not to do that. This information is not intended to replace advice given to you by your health care provider. Make sure you discuss any questions you have with your health care  provider. Document Revised: 12/15/2019 Document Reviewed: 12/15/2019 Elsevier Patient Education  2021 [employer].

## 2020-09-27 NOTE — Telephone Encounter (Signed)
She does not have sleep apnea. She averaged between 3 and 4 apneas/ hour, which is within normal limits. Her oxygen saturation averaged around 95% and was less than 90% for less than 1 minute total during the night.  We can suggest she try to sleep off her back and to keep her weight down. She does not need CPAP.  I will be happy to see her again if she wants to discuss further or if she has other concerns about her breathing.

## 2020-09-28 ENCOUNTER — Encounter: Payer: Self-pay | Admitting: Family Medicine

## 2020-09-29 ENCOUNTER — Encounter: Payer: Self-pay | Admitting: Family Medicine

## 2020-09-29 ENCOUNTER — Other Ambulatory Visit: Payer: Self-pay

## 2020-09-29 ENCOUNTER — Ambulatory Visit: Payer: BC Managed Care – PPO | Admitting: Family Medicine

## 2020-09-29 VITALS — BP 118/70 | HR 99 | Temp 97.4°F | Resp 20 | Ht 63.5 in | Wt 220.0 lb

## 2020-09-29 DIAGNOSIS — H9203 Otalgia, bilateral: Secondary | ICD-10-CM

## 2020-09-29 NOTE — Progress Notes (Signed)
   Subjective:    Patient ID: Abigail Singh, female    DOB: 12/03/1975, 45 y.o.   MRN: [id number]  HPI ? Ear infxn- pt has had recurrent ear infxns and middle ear fluid.  Hurts to wear mask around ears, L>R but bilateral.  Both ears have been draining.  + pressure.  + dizziness.  Intermittent fever but this has been ongoing w/ long COVID.  Pt has seen Dr Redmond Baseman in [place] and Dr Joya Gaskins at [facility].  Using steroid nasal spray and regular antihistamine   Review of Systems For ROS see HPI     Objective:   Physical Exam Vitals reviewed.  Constitutional:      General: She is not in acute distress.    Appearance: Normal appearance. She is not ill-appearing.  HENT:     Head: Normocephalic and atraumatic.     Right Ear: Tympanic membrane, ear canal and external ear normal. There is no impacted cerumen.     Left Ear: Tympanic membrane, ear canal and external ear normal. There is no impacted cerumen.  Neurological:     Mental Status: She is alert and oriented to person, place, and time. Mental status is at baseline.  Psychiatric:        Mood and Affect: Mood normal.        Behavior: Behavior normal.        Thought Content: Thought content normal.           Assessment & Plan:  Ear pain- bilateral.  Pt states she has had recurrent infections and this feels similar but no infxn on exam today.  Suspect she is having problems w/ pressure equalization and may require a PE tube.  Will refer to ENT for complete evaluation and tx.  Pt expressed understanding and is in agreement w/ plan.

## 2020-09-29 NOTE — Patient Instructions (Signed)
Follow up as needed We'll call you with your ENT appt You may need an ear tube to help regulate the pressure Drink LOTS of fluids Continue your allergy medication Tylenol/Ibuprofen as needed for pain Call with any questions or concerns Hang in there!!!

## 2020-10-05 ENCOUNTER — Ambulatory Visit (INDEPENDENT_AMBULATORY_CARE_PROVIDER_SITE_OTHER): Payer: BC Managed Care – PPO | Admitting: Psychology

## 2020-10-05 DIAGNOSIS — F4310 Post-traumatic stress disorder, unspecified: Secondary | ICD-10-CM

## 2020-10-06 ENCOUNTER — Ambulatory Visit: Payer: BC Managed Care – PPO | Admitting: Rheumatology

## 2020-10-14 ENCOUNTER — Encounter: Payer: Self-pay | Admitting: Family Medicine

## 2020-10-14 ENCOUNTER — Other Ambulatory Visit: Payer: Self-pay | Admitting: Family Medicine

## 2020-10-14 DIAGNOSIS — F900 Attention-deficit hyperactivity disorder, predominantly inattentive type: Secondary | ICD-10-CM

## 2020-10-14 MED ORDER — AMPHETAMINE-DEXTROAMPHET ER 20 MG PO CP24: 20.0000 mg | ORAL_CAPSULE | ORAL | 0 refills | Status: DC

## 2020-10-14 NOTE — Telephone Encounter (Signed)
LFD 09/12/20 #30 with no refills LOV 09/29/20 NOV 07/13/21

## 2020-10-19 ENCOUNTER — Ambulatory Visit: Payer: BC Managed Care – PPO | Admitting: Psychology

## 2020-10-27 ENCOUNTER — Other Ambulatory Visit: Payer: Self-pay

## 2020-10-27 ENCOUNTER — Ambulatory Visit (HOSPITAL_BASED_OUTPATIENT_CLINIC_OR_DEPARTMENT_OTHER)
Admission: RE | Admit: 2020-10-27 | Discharge: 2020-10-27 | Disposition: A | Discharge status: Home / Self Care | Payer: BC Managed Care – PPO | Source: Ambulatory Visit | Attending: Cardiology | Admitting: Cardiology

## 2020-10-27 DIAGNOSIS — R5383 Other fatigue: Secondary | ICD-10-CM

## 2020-10-27 NOTE — Progress Notes (Signed)
*  PRELIMINARY RESULTS* Echocardiogram 2D Echocardiogram has been performed.  Luisa Hart RDCS 10/27/2020, 1:42 PM

## 2020-10-28 LAB — ECHOCARDIOGRAM COMPLETE
AR max vel: 2.1 cm2
AV Area VTI: 2.06 cm2
AV Area mean vel: 2.08 cm2
AV Mean grad: 4 mmHg
AV Peak grad: 8.2 mmHg
Ao pk vel: 1.43 m/s
Area-P 1/2: 5.75 cm2
Calc EF: 51.9 %
MV VTI: 2.55 cm2
S' Lateral: 3.31 cm
Single Plane A2C EF: 50.6 %
Single Plane A4C EF: 51.7 %

## 2020-11-02 ENCOUNTER — Ambulatory Visit (INDEPENDENT_AMBULATORY_CARE_PROVIDER_SITE_OTHER): Payer: BC Managed Care – PPO | Admitting: Psychology

## 2020-11-02 ENCOUNTER — Encounter: Payer: Self-pay | Admitting: *Deleted

## 2020-11-02 DIAGNOSIS — F4310 Post-traumatic stress disorder, unspecified: Secondary | ICD-10-CM | POA: Diagnosis not present

## 2020-11-08 ENCOUNTER — Encounter: Payer: Self-pay | Admitting: Family Medicine

## 2020-11-11 ENCOUNTER — Telehealth (INDEPENDENT_AMBULATORY_CARE_PROVIDER_SITE_OTHER): Payer: BC Managed Care – PPO | Admitting: Cardiology

## 2020-11-11 ENCOUNTER — Encounter: Payer: Self-pay | Admitting: Cardiology

## 2020-11-11 ENCOUNTER — Telehealth: Payer: Self-pay

## 2020-11-11 VITALS — BP 124/86 | HR 105 | Ht 63.5 in | Wt 222.0 lb

## 2020-11-11 DIAGNOSIS — I5189 Other ill-defined heart diseases: Secondary | ICD-10-CM

## 2020-11-11 DIAGNOSIS — R600 Localized edema: Secondary | ICD-10-CM | POA: Diagnosis not present

## 2020-11-11 DIAGNOSIS — E669 Obesity, unspecified: Secondary | ICD-10-CM | POA: Diagnosis not present

## 2020-11-11 MED ORDER — POTASSIUM CHLORIDE ER 10 MEQ PO TBCR: 10.0000 meq | EXTENDED_RELEASE_TABLET | ORAL | 3 refills | Status: DC | PRN

## 2020-11-11 MED ORDER — METOPROLOL SUCCINATE ER 25 MG PO TB24: 12.5000 mg | ORAL_TABLET | Freq: Every evening | ORAL | 3 refills | Status: DC

## 2020-11-11 MED ORDER — FUROSEMIDE 20 MG PO TABS: 20.0000 mg | ORAL_TABLET | ORAL | 3 refills | Status: DC | PRN

## 2020-11-11 NOTE — Progress Notes (Signed)
Virtual Visit via Video Note   This visit type was conducted due to national recommendations for restrictions regarding the COVID-19 Pandemic (e.g. social distancing) in an effort to limit this patient's exposure and mitigate transmission in our community.  Due to her co-morbid illnesses, this patient is at least at moderate risk for complications without adequate follow up.  This format is felt to be most appropriate for this patient at this time.  All issues noted in this document were discussed and addressed.  A limited physical exam was performed with this format.  Please refer to the patient's chart for her consent to telehealth for [facility].      Date:  11/11/2020   ID:  Abigail Singh, DOB Jun 16, 1975, MRN [id number]  Patient Location: Home Provider Location: Office/Clinic  PCP:  Midge Minium, MD  Cardiologist:  Berniece Salines, DO  Electrophysiologist:  None   Evaluation Performed:  Follow-Up Visit  Chief Complaint:  " I am still having palpitations"  History of Present Illness:    Abigail Singh is a 45 y.o. female with  anxiety, history of COVID-19 infection and long COVID syndrome with autonomic dysfunction recently started on metoprolol due to persistent tachycardia, obesity, fibromyalgia, migraine headaches with vertigo, familial hyperlipidemia.  I saw the patient on 09/27/2020 at that time she was experiencing palpitations. She had been started on beta blockers by her pcp. During her last visit we increased her toprol xl to 50 mg daily.  An echo was also ordered.  Today she tells me that she has felt better but still have some intermittent outburst of fast heart rate which really bothers her.   She has been taking her bp at home and appears to be averaging 433-295J systolic and 88C diastolic.   The patient does not have symptoms concerning for COVID-19 infection (fever, chills, cough, or new shortness of breath).    Past Medical History:  Diagnosis Date    Allergy    sseasonal   Anemia    as teenager   Anxiety    Depression    Fibroid    Fibromyalgia    Heartburn in pregnancy    History of fibromyalgia    Migraines    Neuromuscular disorder ([place])    fibromyalgia   Postpartum care following cesarean delivery 03/17/2012   Preterm labor    Status post cesarean delivery (repeat x 2) 03/17/2012   Urinary tract infection    Past Surgical History:  Procedure Laterality Date   abnormal cells in colon/tatoo area     2012   CESAREAN SECTION  2009   CESAREAN SECTION  03/17/2012   Procedure: CESAREAN SECTION;  Surgeon: Marvene Staff, MD;  Location: [facility];  Service: Obstetrics;  Laterality: N/A;  Repeat C/S.  EDD: 03/23/12   COLONOSCOPY     DILATION AND CURETTAGE OF UTERUS     POLYPECTOMY     tubes in ears     WISDOM TOOTH EXTRACTION       Current Meds  Medication Sig   acetaminophen (TYLENOL) 325 MG tablet Take 650 mg by mouth every 6 (six) hours as needed for moderate pain.   albuterol (VENTOLIN HFA) 108 (90 Base) MCG/ACT inhaler Inhale 1 puff into the lungs as needed for wheezing or shortness of breath.   amphetamine-dextroamphetamine (ADDERALL XR) 20 MG 24 hr capsule Take 1 capsule (20 mg total) by mouth every morning.   Ascorbic Acid (VITAMIN C) 1000 MG tablet Take 1,000 mg by mouth  daily.   BIOTIN PO Take 200 mg by mouth daily.   Budesonide-Formoterol Fumarate (SYMBICORT IN) Inhale 1 puff into the lungs as needed (shortness of breath).   Calcium Carbonate (CALCIUM 500 PO) Take 500 mg by mouth daily.   chlorpheniramine (CHLOR-TRIMETON) 4 MG tablet Take 4 mg by mouth every 4 (four) hours as needed. 2AT BEDTIME   Cholecalciferol (VITAMIN D) 50 MCG (2000 UT) CAPS Take 2,000 Units by mouth daily.   EPINEPHrine 0.3 mg/0.3 mL IJ SOAJ injection Inject 0.3 mLs (0.3 mg total) into the muscle as needed for anaphylaxis.   famotidine (PEPCID) 20 MG tablet Take 20 mg by mouth 2 (two) times daily.   furosemide (LASIX) 20 MG tablet Take 1  tablet (20 mg total) by mouth as needed (Bilateral leg swelling).   gabapentin (NEURONTIN) 300 MG capsule Take 300 mg by mouth daily.   ibuprofen (ADVIL) 200 MG tablet Take 400 mg by mouth as needed for moderate pain.   medroxyPROGESTERone (DEPO-PROVERA) 150 MG/ML injection SMARTSIG:1 Milliliter(s) IM Every 12 Weeks   metoprolol succinate (TOPROL XL) 25 MG 24 hr tablet Take 0.5 tablets (12.5 mg total) by mouth at bedtime.   metoprolol succinate (TOPROL-XL) 50 MG 24 hr tablet Take 1 tablet (50 mg total) by mouth daily. Take with or immediately following a meal.   MULTIPLE VITAMIN PO Take 1 capsule by mouth daily.   pantoprazole (PROTONIX) 40 MG tablet TAKE 1 TABLET DAILY 30-60 MIN BEFORE 1ST MEAL OF THE DAY   potassium chloride (KLOR-CON) 10 MEQ tablet Take 1 tablet (10 mEq total) by mouth as needed (Bilateral leg swelling).   Probiotic Product (PROBIOTIC PO) Take by mouth daily.   QUERCETIN PO Take by mouth daily.   rizatriptan (MAXALT-MLT) 10 MG disintegrating tablet Take 1 tablet (10 mg total) by mouth 3 (three) times daily as needed for migraine. May repeat in 2 hours if needed   traZODone (DESYREL) 50 MG tablet TAKE 0.5-1 TABLETS BY MOUTH AT BEDTIME AS NEEDED FOR SLEEP.   triamcinolone (NASACORT) 55 MCG/ACT AERO nasal inhaler Place 2 sprays into the nose daily.   vitamin B-12 (CYANOCOBALAMIN) 500 MCG tablet Take 500 mcg by mouth daily.   Zinc 30 MG TABS Take 30 mg by mouth daily.     Allergies:   Aspirin, Citrus, Codeine, Eggs or egg-derived products, Erythromycin, Lactose intolerance (gi), Penicillins, and Sulfonamide derivatives   Social History   Tobacco Use   Smoking status: Former    Packs/day: 1.00    Years: 11.00    Pack years: 11.00    Types: Cigarettes    Quit date: 05/07/1997    Years since quitting: 23.5   Smokeless tobacco: Never   Tobacco comments:    no E cigs  Vaping Use   Vaping Use: Never used  Substance Use Topics   Alcohol use: No    Alcohol/week: 0.0  standard drinks   Drug use: No     Family Hx: The patient's family history includes Allergic rhinitis in her daughter; Anxiety disorder in her daughter, daughter, and daughter; Asthma in her daughter; Colon cancer in her paternal grandmother; Colon polyps in her maternal grandfather and maternal grandmother; Diabetes in her father, maternal aunt, maternal grandfather, maternal grandmother, mother, and another family member; Eczema in her daughter; Food Allergy in her father; Heart attack in her father and another family member; Heart disease in her maternal grandfather and paternal grandfather; Heart failure in an other family member; High Cholesterol in her father and mother;  Hypertension in her father; Lactose intolerance in her daughter, daughter, and daughter; OCD in her daughter; Stroke in an other family member. There is no history of Angioedema or Immunodeficiency.  ROS:   Please see the history of present illness.     All other systems reviewed and are negative.   Prior CV studies:   The following studies were reviewed today:  TTE 10/28/2020 IMPRESSIONS   1. Left ventricular ejection fraction, by estimation, is 55 to 60%. The left ventricle has normal function. The left ventricle has no regional wall motion abnormalities. Left ventricular diastolic parameters are consistent with Grade I diastolic dysfunction (impaired relaxation).   2. Right ventricular systolic function is normal. The right ventricular size is normal. There is normal pulmonary artery systolic pressure.   3. The mitral valve is normal in structure. No evidence of mitral valve regurgitation. No evidence of mitral stenosis.   4. The aortic valve is normal in structure. Aortic valve regurgitation is not visualized. No aortic stenosis is present.   5. The inferior vena cava is normal in size with greater than 50% respiratory variability, suggesting right atrial pressure of 3 mmHg.   FINDINGS   Left Ventricle: Left  ventricular ejection fraction, by estimation, is 55 to 60%. The left ventricle has normal function. The left ventricle has no regional wall motion abnormalities. The left ventricular internal cavity  size was normal in size. There is  no left ventricular hypertrophy. Left ventricular diastolic parameters are consistent with Grade I diastolic dysfunction (impaired relaxation).   Right Ventricle: The right ventricular size is normal. No increase in right ventricular wall thickness. Right ventricular systolic function is  normal. There is normal pulmonary artery systolic pressure. The tricuspid  regurgitant velocity is 2.05 m/s, and   with an assumed right atrial pressure of 3 mmHg, the estimated right  ventricular systolic pressure is 32.9 mmHg.   Left Atrium: Left atrial size was normal in size.   Right Atrium: Right atrial size was normal in size.   Pericardium: There is no evidence of pericardial effusion.   Mitral Valve: The mitral valve is normal in structure. No evidence of  mitral valve regurgitation. No evidence of mitral valve stenosis. MV peak  gradient, 4.2 mmHg. The mean mitral valve gradient is 2.0 mmHg.   Tricuspid Valve: The tricuspid valve is normal in structure. Tricuspid  valve regurgitation is mild . No evidence of tricuspid stenosis.   Aortic Valve: The aortic valve is normal in structure. Aortic valve  regurgitation is not visualized. No aortic stenosis is present. Aortic  valve mean gradient measures 4.0 mmHg. Aortic valve peak gradient measures  8.2 mmHg. Aortic valve area, by VTI  measures 2.06 cm.   Pulmonic Valve: The pulmonic valve was normal in structure. Pulmonic valve  regurgitation is not visualized. No evidence of pulmonic stenosis.   Aorta: The aortic root is normal in size and structure.   Venous: The inferior vena cava is normal in size with greater than 50%  respiratory variability, suggesting right atrial pressure of 3 mmHg.   IAS/Shunts: No  atrial level shunt detected by color flow Doppler.    Labs/Other Tests and Data Reviewed:    EKG:  No ECG reviewed.  Recent Labs: 07/08/2020: BUN 17; Creatinine, Ser 0.82; Hemoglobin 13.5; Platelets 318.0; Potassium 4.0; Sodium 140; TSH 1.58 07/11/2020: ALT 22   Recent Lipid Panel Lab Results  Component Value Date/Time   CHOL 193 07/11/2020 08:13 AM   TRIG 132.0 07/11/2020 08:13 AM  HDL 42.70 07/11/2020 08:13 AM   CHOLHDL 5 07/11/2020 08:13 AM   LDLCALC 124 (H) 07/11/2020 08:13 AM    Wt Readings from Last 3 Encounters:  11/11/20 222 lb (100.7 kg)  09/29/20 220 lb (99.8 kg)  09/27/20 219 lb (99.3 kg)     Objective:    Vital Signs:  BP 124/86   Pulse (!) 105   Ht 5' 3.5" (1.613 m)   Wt 222 lb (100.7 kg)   SpO2 100%   BMI 38.71 kg/m    Virtual visit   ASSESSMENT & PLAN:    Obesity Bilateral leg edema Palpitations - likely autonomic dysfunction from Covid 19 infection   She is still experiencing palpitations that is uncomfortable, I will continue her Toprol xl 50mg  in the am and will like to add Toprol xl 12.5 mg at night time.  She reports recurrent leg edema - will give lasix 20 mg daily as needed Will refer the patient to medical weight management  COVID-19 Education: The signs and symptoms of COVID-19 were discussed with the patient and how to seek care for testing (follow up with PCP or arrange E-visit).  The importance of social distancing was discussed today.  Time:   Today, I have spent 10 minutes with the patient with telehealth technology discussing the above problems.    Medication Adjustments/Labs and Tests Ordered: Current medicines are reviewed at length with the patient today.  Concerns regarding medicines are outlined above.   Tests Ordered: Orders Placed This Encounter  Procedures   Amb Ref to Medical Weight Management    Medication Changes: Meds ordered this encounter  Medications   metoprolol succinate (TOPROL XL) 25 MG 24 hr tablet     Sig: Take 0.5 tablets (12.5 mg total) by mouth at bedtime.    Dispense:  45 tablet    Refill:  3   furosemide (LASIX) 20 MG tablet    Sig: Take 1 tablet (20 mg total) by mouth as needed (Bilateral leg swelling).    Dispense:  45 tablet    Refill:  3   potassium chloride (KLOR-CON) 10 MEQ tablet    Sig: Take 1 tablet (10 mEq total) by mouth as needed (Bilateral leg swelling).    Dispense:  45 tablet    Refill:  3    Follow Up:  In Person in 6 month(s)  Signed, Berniece Salines, DO  11/11/2020 1:10 PM    [facility]

## 2020-11-11 NOTE — Telephone Encounter (Signed)
Called patient to go over her discharge instructions, she verbalized understanding and thanked me for calling her. She states "You all have been wonderful. And she is very impressed." No further questions at this time.

## 2020-11-11 NOTE — Patient Instructions (Addendum)
Medication Instructions:  Your physician has recommended you make the following change in your medication:  KEEP: Metoprolol 50 mg in the morning ADD: Metoprolol 12.5 mg at night START: Lasix 20 mg as needed START: Potassium 10 meq as needed  *If you need a refill on your cardiac medications before your next appointment, please call your pharmacy*   Lab Work: None If you have labs (blood work) drawn today and your tests are completely normal, you will receive your results only by: [place] (if you have MyChart) OR A paper copy in the mail If you have any lab test that is abnormal or we need to change your treatment, we will call you to review the results.   Testing/Procedures: None   Follow-Up: At [facility], you and your health needs are our priority.  As part of our continuing mission to provide you with exceptional heart care, we have created designated Provider Care Teams.  These Care Teams include your primary Cardiologist (physician) and Advanced Practice Providers (APPs -  Physician Assistants and Nurse Practitioners) who all work together to provide you with the care you need, when you need it.  We recommend signing up for the patient portal called "MyChart".  Sign up information is provided on this After Visit Summary.  MyChart is used to connect with patients for Virtual Visits (Telemedicine).  Patients are able to view lab/test results, encounter notes, upcoming appointments, etc.  Non-urgent messages can be sent to your provider as well.   To learn more about what you can do with MyChart, go to [email or web address].    Your next appointment:   6 month(s)  The format for your next appointment:   In Person  Provider:   [address] - Berniece Salines, DO    Other Instructions

## 2020-11-13 ENCOUNTER — Other Ambulatory Visit: Payer: Self-pay | Admitting: Family Medicine

## 2020-11-13 DIAGNOSIS — F900 Attention-deficit hyperactivity disorder, predominantly inattentive type: Secondary | ICD-10-CM

## 2020-11-14 MED ORDER — AMPHETAMINE-DEXTROAMPHET ER 20 MG PO CP24: 20.0000 mg | ORAL_CAPSULE | ORAL | 0 refills | Status: DC

## 2020-11-14 NOTE — Telephone Encounter (Signed)
10/14/20 #30 with no refills Last visit 09/29/20 for ear pain Next visit 07/13/21

## 2020-11-16 ENCOUNTER — Ambulatory Visit (INDEPENDENT_AMBULATORY_CARE_PROVIDER_SITE_OTHER): Payer: BC Managed Care – PPO | Admitting: Psychology

## 2020-11-16 DIAGNOSIS — F4310 Post-traumatic stress disorder, unspecified: Secondary | ICD-10-CM | POA: Diagnosis not present

## 2020-11-17 NOTE — Telephone Encounter (Signed)
Pt is calling and following up regarding form for disability needed to be completed she needs to possibly get this turned in tomorrow?  Pt call back [phone]

## 2020-11-21 ENCOUNTER — Encounter: Payer: Self-pay | Admitting: Family Medicine

## 2020-11-25 ENCOUNTER — Telehealth: Payer: BC Managed Care – PPO | Admitting: Family Medicine

## 2020-11-28 ENCOUNTER — Other Ambulatory Visit: Payer: Self-pay

## 2020-11-28 ENCOUNTER — Encounter: Payer: Self-pay | Admitting: Internal Medicine

## 2020-11-28 ENCOUNTER — Encounter: Payer: Self-pay | Admitting: Registered Nurse

## 2020-11-28 ENCOUNTER — Other Ambulatory Visit: Payer: Self-pay | Admitting: Family Medicine

## 2020-11-28 ENCOUNTER — Telehealth (INDEPENDENT_AMBULATORY_CARE_PROVIDER_SITE_OTHER): Payer: BC Managed Care – PPO | Admitting: Registered Nurse

## 2020-11-28 VITALS — BP 114/76 | HR 105

## 2020-11-28 DIAGNOSIS — G709 Myoneural disorder, unspecified: Secondary | ICD-10-CM

## 2020-11-28 DIAGNOSIS — R5382 Chronic fatigue, unspecified: Secondary | ICD-10-CM

## 2020-11-28 DIAGNOSIS — G909 Disorder of the autonomic nervous system, unspecified: Secondary | ICD-10-CM

## 2020-11-28 DIAGNOSIS — E669 Obesity, unspecified: Secondary | ICD-10-CM

## 2020-11-28 DIAGNOSIS — U099 Post covid-19 condition, unspecified: Secondary | ICD-10-CM | POA: Diagnosis not present

## 2020-11-28 NOTE — Progress Notes (Signed)
Telemedicine Encounter- SOAP NOTE Established Patient  This video encounter was conducted with the patient's (or proxy's) verbal consent via audio telecommunications: yes/no: Yes Patient was instructed to have this encounter in a suitably private space; and to only have persons present to whom they give permission to participate. In addition, patient identity was confirmed by use of name plus two identifiers (DOB and address).  I discussed the limitations, risks, security and privacy concerns of performing an evaluation and management service by telephone and the availability of in person appointments. I also discussed with the patient that there may be a patient responsible charge related to this service. The patient expressed understanding and agreed to proceed.  I spent a total of 24 minutes talking with the patient or their proxy.  Patient at home Provider in office  Participants: [name], NP and [name]  Chief Complaint  Patient presents with   Follow-up    Patient states she would like to follow up for disability. And needs to know if she okay to return to work    Subjective   Abigail Singh is a 45 y.o. established patient. Telephone visit today for return to work  HPI Pt has been sick over 1.5 years - suspected COVID in March 2021 - multiple negative tests - COVID vs RSV.  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 Had started to improve by December, but in January 2022 had COVID with pneumonia. Has met with a number of specialists including ENT, cardiology, pulmonology, and rheumatology.  Still participating in speech therapy for rasping voice Still having some drops in O2 sats   Notes yesterday HR went up to 148 at [employer] while pushing a cart.  Today highest HR is 129 Notes she had been tolerating more activity well and is eager to return to work  Unsure if she is able to do full days or full weeks but feels that  she needs to start.  Unsure if her activity intolerance is entirely related to long illness, autonomic dysfunction, or if her weight gain of 100lbs is a more contributing factor.  Patient Active Problem List   Diagnosis Date Noted   Urinary tract infection    Preterm labor    Neuromuscular disorder ([place])    Migraines    History of fibromyalgia    Heartburn in pregnancy    Fibroid    Anxiety    Anemia    Allergy    Tachycardia 09/02/2020   Autonomic instability 09/02/2020   Nocturnal hypoxemia 08/31/2020   Fatigue 08/31/2020   DOE (dyspnea on exertion) 07/08/2020   Alteration in speech 06/28/2020   Depression 06/15/2020   Chronic cough 05/31/2020   Long COVID 05/31/2020   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   Seasonal allergic conjunctivitis 06/24/2018   Other allergic rhinitis 06/24/2018   Other dysphagia 06/24/2018   Fibromyalgia 12/05/2017   Vitamin D deficiency 11/26/2016   Insomnia 11/26/2016   ADHD 11/26/2016   Obesity (BMI 30-39.9) 01/27/2016   Migraine 12/29/2014   Plantar fasciitis, bilateral 11/01/2014   Sialoadenitis 10/21/2013   Status post cesarean delivery (repeat x 2) 03/17/2012   Postpartum care following cesarean delivery 03/17/2012   Physical exam 10/23/2010   FIBROMYALGIA 11/09/2009   Thyroid nodule 10/21/2009    Past Medical History:  Diagnosis Date   Allergy    sseasonal   Anemia    as teenager   Anxiety  Depression    Fibroid    Fibromyalgia    Heartburn in pregnancy    History of fibromyalgia    Migraines    Neuromuscular disorder ([place])    fibromyalgia   Postpartum care following cesarean delivery 03/17/2012   Preterm labor    Status post cesarean delivery (repeat x 2) 03/17/2012   Urinary tract infection     Current Outpatient Medications  Medication Sig Dispense Refill   acetaminophen (TYLENOL) 325 MG tablet Take 650 mg by mouth every 6 (six) hours as needed for moderate pain.     albuterol (VENTOLIN  HFA) 108 (90 Base) MCG/ACT inhaler Inhale 1 puff into the lungs as needed for wheezing or shortness of breath.     amphetamine-dextroamphetamine (ADDERALL XR) 20 MG 24 hr capsule Take 1 capsule (20 mg total) by mouth every morning. 30 capsule 0   Ascorbic Acid (VITAMIN C) 1000 MG tablet Take 1,000 mg by mouth daily.     BIOTIN PO Take 200 mg by mouth daily.     Budesonide-Formoterol Fumarate (SYMBICORT IN) Inhale 1 puff into the lungs as needed (shortness of breath).     Calcium Carbonate (CALCIUM 500 PO) Take 500 mg by mouth daily.     chlorpheniramine (CHLOR-TRIMETON) 4 MG tablet Take 4 mg by mouth every 4 (four) hours as needed. 2AT BEDTIME     Cholecalciferol (VITAMIN D) 50 MCG (2000 UT) CAPS Take 2,000 Units by mouth daily.     EPINEPHrine 0.3 mg/0.3 mL IJ SOAJ injection Inject 0.3 mLs (0.3 mg total) into the muscle as needed for anaphylaxis. 1 Device 1   famotidine (PEPCID) 20 MG tablet Take 20 mg by mouth 2 (two) times daily.     furosemide (LASIX) 20 MG tablet Take 1 tablet (20 mg total) by mouth as needed (Bilateral leg swelling). 45 tablet 3   gabapentin (NEURONTIN) 300 MG capsule Take 300 mg by mouth daily.     ibuprofen (ADVIL) 200 MG tablet Take 400 mg by mouth as needed for moderate pain.     medroxyPROGESTERone (DEPO-PROVERA) 150 MG/ML injection SMARTSIG:1 Milliliter(s) IM Every 12 Weeks     metoprolol succinate (TOPROL XL) 25 MG 24 hr tablet Take 0.5 tablets (12.5 mg total) by mouth at bedtime. 45 tablet 3   metoprolol succinate (TOPROL-XL) 50 MG 24 hr tablet Take 1 tablet (50 mg total) by mouth daily. Take with or immediately following a meal. 90 tablet 3   MULTIPLE VITAMIN PO Take 1 capsule by mouth daily.     pantoprazole (PROTONIX) 40 MG tablet TAKE 1 TABLET DAILY 30-60 MIN BEFORE 1ST MEAL OF THE DAY 90 tablet 0   potassium chloride (KLOR-CON) 10 MEQ tablet Take 1 tablet (10 mEq total) by mouth as needed (Bilateral leg swelling). 45 tablet 3   Probiotic Product (PROBIOTIC PO)  Take by mouth daily.     QUERCETIN PO Take by mouth daily.     rizatriptan (MAXALT-MLT) 10 MG disintegrating tablet Take 1 tablet (10 mg total) by mouth 3 (three) times daily as needed for migraine. May repeat in 2 hours if needed 9 tablet 3   traZODone (DESYREL) 50 MG tablet TAKE 0.5-1 TABLETS BY MOUTH AT BEDTIME AS NEEDED FOR SLEEP. 90 tablet 2   triamcinolone (NASACORT) 55 MCG/ACT AERO nasal inhaler Place 2 sprays into the nose daily. 1 Inhaler 1   vitamin B-12 (CYANOCOBALAMIN) 500 MCG tablet Take 500 mcg by mouth daily.     Zinc 30 MG TABS Take 30 mg  by mouth daily.     No current facility-administered medications for this visit.    Allergies  Allergen Reactions   Aspirin Anaphylaxis    Pt reports being able to take ibuprofen with no problems.   Citrus Hives and Swelling   Codeine Other (See Comments)    halucinations   Eggs Or Egg-Derived Products Other (See Comments)    Patient states ok with flu shots, etc. No anaphalaxis.  Eggs give stomach cramps with diarrhea. After diarrhea ok.-pt eats baked goods that contains eggs with no issues    Erythromycin Rash   Lactose Intolerance (Gi) Diarrhea    Diarrhea with lactose.   Penicillins Rash   Sulfonamide Derivatives Rash    Childhood reaction, has taken recently with only stomach irritation.    Social History   Socioeconomic History   Marital status: Married    Spouse name: Not on file   Number of children: Not on file   Years of education: Not on file   Highest education level: Not on file  Occupational History   Not on file  Tobacco Use   Smoking status: Former    Packs/day: 1.00    Years: 11.00    Pack years: 11.00    Types: Cigarettes    Quit date: 05/07/1997    Years since quitting: 23.5   Smokeless tobacco: Never   Tobacco comments:    no E cigs  Vaping Use   Vaping Use: Never used  Substance and Sexual Activity   Alcohol use: No    Alcohol/week: 0.0 standard drinks   Drug use: No   Sexual activity: Yes   Other Topics Concern   Not on file  Social History Narrative   Not on file   Social Determinants of Health   Financial Resource Strain: Not on file  Food Insecurity: Not on file  Transportation Needs: Not on file  Physical Activity: Not on file  Stress: Not on file  Social Connections: Not on file  Intimate Partner Violence: Not on file    ROS Per hpi   Objective  Pt well appearing on video Occ shortness of breath, no audible wheezing Engaging appropriately Good mood and affect.   Vitals as reported by the patient: Today's Vitals   11/28/20 1205  BP: 114/76  Pulse: (!) 105  SpO2: 97%    There are no diagnoses linked to this encounter.  PLAN Agree with patient that getting back to work will likely do a lot to help out her fatigue and energy levels. However, urged caution in returning to full time work too soon.  Discussed an intermittent return with 4-10 hours each day working 2-4 days each week as tolerated. She has guidelines from cardiology to not work if her HR rises above 140 or if her BP is elevated and I have urged compliance with these guidelines.  In addition to absence for symptoms, she will need built in absences for follow up with multiple specialists.  Emphasized that she has a very low threshold for absence from work. Questions on next booster vs. COVID - will defer to specialists  Patient encouraged to call clinic with any questions, comments, or concerns.  I discussed the assessment and treatment plan with the patient. The patient was provided an opportunity to ask questions and all were answered. The patient agreed with the plan and demonstrated an understanding of the instructions.   The patient was advised to call back or seek an in-person evaluation if the symptoms worsen  or if the condition fails to improve as anticipated.  I provided 24 minutes of non-face-to-face time during this encounter.  Maximiano Coss, NP

## 2020-11-30 ENCOUNTER — Ambulatory Visit (INDEPENDENT_AMBULATORY_CARE_PROVIDER_SITE_OTHER): Payer: BC Managed Care – PPO | Admitting: Psychology

## 2020-11-30 DIAGNOSIS — F4310 Post-traumatic stress disorder, unspecified: Secondary | ICD-10-CM

## 2020-11-30 NOTE — Telephone Encounter (Signed)
I can certainly still see her if she has questions or would like to work on her sleep issues more. If she is satisfied at this point then fine to cancel the appointment.

## 2020-11-30 NOTE — Telephone Encounter (Signed)
Appointment canceled.

## 2020-11-30 NOTE — Telephone Encounter (Signed)
Mychart message sent by pt wanting to know if she still needs to keep her upcoming appt with CY 7/28 as the HST showed that she does not have sleep apnea.  Dr. Annamaria Boots, please advise if we can cancel this appt?

## 2020-12-01 ENCOUNTER — Ambulatory Visit: Payer: BC Managed Care – PPO | Admitting: Internal Medicine

## 2020-12-07 ENCOUNTER — Other Ambulatory Visit: Payer: Self-pay | Admitting: Internal Medicine

## 2020-12-08 ENCOUNTER — Telehealth: Payer: Self-pay

## 2020-12-08 NOTE — Telephone Encounter (Signed)
Called patient to let her know that her paperwork will be up front ready for pick up Today.

## 2020-12-12 ENCOUNTER — Other Ambulatory Visit: Payer: Self-pay | Admitting: Family Medicine

## 2020-12-12 DIAGNOSIS — F900 Attention-deficit hyperactivity disorder, predominantly inattentive type: Secondary | ICD-10-CM

## 2020-12-12 MED ORDER — AMPHETAMINE-DEXTROAMPHET ER 20 MG PO CP24
20.0000 mg | ORAL_CAPSULE | ORAL | 0 refills | Status: DC
Start: 2020-12-12 — End: 2021-01-13

## 2020-12-12 NOTE — Telephone Encounter (Signed)
LFD 11/14/20 #30 with no refills LOV 11/28/20 NOV 07/13/21

## 2020-12-13 ENCOUNTER — Other Ambulatory Visit: Payer: Self-pay

## 2020-12-13 ENCOUNTER — Encounter: Payer: Self-pay | Admitting: Registered Nurse

## 2020-12-13 ENCOUNTER — Encounter (INDEPENDENT_AMBULATORY_CARE_PROVIDER_SITE_OTHER): Payer: Self-pay | Admitting: Family Medicine

## 2020-12-13 ENCOUNTER — Ambulatory Visit (INDEPENDENT_AMBULATORY_CARE_PROVIDER_SITE_OTHER): Payer: BC Managed Care – PPO | Admitting: Family Medicine

## 2020-12-13 VITALS — BP 104/70 | HR 73 | Temp 97.7°F | Ht 64.0 in | Wt 209.0 lb

## 2020-12-13 DIAGNOSIS — R5382 Chronic fatigue, unspecified: Secondary | ICD-10-CM | POA: Diagnosis not present

## 2020-12-13 DIAGNOSIS — R000 Tachycardia, unspecified: Secondary | ICD-10-CM | POA: Diagnosis not present

## 2020-12-13 DIAGNOSIS — F3289 Other specified depressive episodes: Secondary | ICD-10-CM | POA: Diagnosis not present

## 2020-12-13 DIAGNOSIS — E785 Hyperlipidemia, unspecified: Secondary | ICD-10-CM | POA: Diagnosis not present

## 2020-12-13 DIAGNOSIS — Z9189 Other specified personal risk factors, not elsewhere classified: Secondary | ICD-10-CM | POA: Diagnosis not present

## 2020-12-13 DIAGNOSIS — E66812 Obesity, class 2: Secondary | ICD-10-CM

## 2020-12-13 DIAGNOSIS — Z0289 Encounter for other administrative examinations: Secondary | ICD-10-CM

## 2020-12-13 DIAGNOSIS — E559 Vitamin D deficiency, unspecified: Secondary | ICD-10-CM | POA: Diagnosis not present

## 2020-12-13 DIAGNOSIS — F39 Unspecified mood [affective] disorder: Secondary | ICD-10-CM

## 2020-12-13 DIAGNOSIS — R0602 Shortness of breath: Secondary | ICD-10-CM | POA: Diagnosis not present

## 2020-12-13 DIAGNOSIS — M797 Fibromyalgia: Secondary | ICD-10-CM

## 2020-12-13 DIAGNOSIS — Z6836 Body mass index (BMI) 36.0-36.9, adult: Secondary | ICD-10-CM

## 2020-12-13 MED ORDER — METOPROLOL SUCCINATE ER 25 MG PO TB24: 25.0000 mg | ORAL_TABLET | Freq: Every evening | ORAL | 3 refills | Status: DC

## 2020-12-13 NOTE — Progress Notes (Signed)
Prescription sent to pharmacy.

## 2020-12-14 ENCOUNTER — Ambulatory Visit (INDEPENDENT_AMBULATORY_CARE_PROVIDER_SITE_OTHER): Payer: BC Managed Care – PPO | Admitting: Psychology

## 2020-12-14 DIAGNOSIS — F4310 Post-traumatic stress disorder, unspecified: Secondary | ICD-10-CM

## 2020-12-14 LAB — COMPREHENSIVE METABOLIC PANEL
ALT: 17 IU/L (ref 0–32)
AST: 19 IU/L (ref 0–40)
Albumin/Globulin Ratio: 1.5 (ref 1.2–2.2)
Albumin: 4.2 g/dL (ref 3.8–4.8)
Alkaline Phosphatase: 118 IU/L (ref 44–121)
BUN/Creatinine Ratio: 16 (ref 9–23)
BUN: 13 mg/dL (ref 6–24)
Bilirubin Total: 0.3 mg/dL (ref 0.0–1.2)
CO2: 22 mmol/L (ref 20–29)
Calcium: 9.4 mg/dL (ref 8.7–10.2)
Chloride: 106 mmol/L (ref 96–106)
Creatinine, Ser: 0.82 mg/dL (ref 0.57–1.00)
Globulin, Total: 2.8 g/dL (ref 1.5–4.5)
Glucose: 89 mg/dL (ref 65–99)
Potassium: 4.8 mmol/L (ref 3.5–5.2)
Sodium: 141 mmol/L (ref 134–144)
Total Protein: 7 g/dL (ref 6.0–8.5)
eGFR: 90 mL/min/{1.73_m2} (ref >59)

## 2020-12-14 LAB — CBC WITH DIFFERENTIAL
Basophils Absolute: 0 10*3/uL (ref 0.0–0.2)
Basos: 0 %
EOS (ABSOLUTE): 0.1 10*3/uL (ref 0.0–0.4)
Eos: 2 %
Hematocrit: 40.6 % (ref 34.0–46.6)
Hemoglobin: 13.3 g/dL (ref 11.1–15.9)
Immature Grans (Abs): 0 10*3/uL (ref 0.0–0.1)
Immature Granulocytes: 0 %
Lymphocytes Absolute: 2 10*3/uL (ref 0.7–3.1)
Lymphs: 32 %
MCH: 27.7 pg (ref 26.6–33.0)
MCHC: 32.8 g/dL (ref 31.5–35.7)
MCV: 85 fL (ref 79–97)
Monocytes Absolute: 0.5 10*3/uL (ref 0.1–0.9)
Monocytes: 7 %
Neutrophils Absolute: 3.8 10*3/uL (ref 1.4–7.0)
Neutrophils: 59 %
RBC: 4.8 x10E6/uL (ref 3.77–5.28)
RDW: 14.3 % (ref 11.7–15.4)
WBC: 6.4 10*3/uL (ref 3.4–10.8)

## 2020-12-14 LAB — LIPID PANEL WITH LDL/HDL RATIO
Cholesterol, Total: 181 mg/dL (ref 100–199)
HDL: 35 mg/dL — ABNORMAL LOW (ref >39)
LDL Chol Calc (NIH): 122 mg/dL — ABNORMAL HIGH (ref 0–99)
LDL/HDL Ratio: 3.5 ratio — ABNORMAL HIGH (ref 0.0–3.2)
Triglycerides: 134 mg/dL (ref 0–149)
VLDL Cholesterol Cal: 24 mg/dL (ref 5–40)

## 2020-12-14 LAB — INSULIN, RANDOM: INSULIN: 13.1 u[IU]/mL (ref 2.6–24.9)

## 2020-12-14 LAB — VITAMIN D 25 HYDROXY (VIT D DEFICIENCY, FRACTURES): Vit D, 25-Hydroxy: 55.7 ng/mL (ref 30.0–100.0)

## 2020-12-14 LAB — FOLATE: Folate: >20 ng/mL (ref >3.0)

## 2020-12-14 LAB — VITAMIN B12: Vitamin B-12: 614 pg/mL (ref 232–1245)

## 2020-12-14 LAB — HEMOGLOBIN A1C
Est. average glucose Bld gHb Est-mCnc: 120 mg/dL
Hgb A1c MFr Bld: 5.8 % — ABNORMAL HIGH (ref 4.8–5.6)

## 2020-12-14 LAB — TSH: TSH: 1.21 u[IU]/mL (ref 0.450–4.500)

## 2020-12-14 LAB — T4, FREE: Free T4: 1.28 ng/dL (ref 0.82–1.77)

## 2020-12-14 NOTE — Progress Notes (Addendum)
Dear Dr. Harriet Singh,   Thank you for referring Abigail Singh to our clinic. The following note includes my evaluation and treatment recommendations.  Chief Complaint:   OBESITY Abigail Singh (MR# [id number]) is a 45 y.o. female who presents for evaluation and treatment of obesity and related comorbidities. Current BMI is Body mass index is 35.87 kg/m. Abigail Singh has been struggling with her weight for many years and has been unsuccessful in either losing weight, maintaining weight loss, or reaching her healthy weight goal.  Abigail Singh is currently in the action stage of change and ready to dedicate time achieving and maintaining a healthier weight. Abigail Singh is interested in becoming our patient and working on intensive lifestyle modifications including (but not limited to) diet and exercise for weight loss.  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 She has been sick since January 2022. She lives at home with her husband, Abigail Singh and 3 kids, two 16 year olds and an 71 year old. She did "diabetic diet" in the past, which worked best for pt. She craves soda and carbs, which are her worst habits. She is down to one 16-20 oz regular soda per day. History of gestational diabetes with 45 year old. Pt was referred by cardiologist, Dr. Harriet Singh.  Abigail Singh's habits were reviewed today and are as follows: Her family eats meals together, she thinks her family will eat healthier with her, her desired weight loss is 79-89 lbs, she started gaining weight when COVID pandemic hit, her heaviest weight ever was 225 pounds, she has significant food cravings issues, she wakes up frequently in the middle of the night to eat, she is frequently drinking liquids with calories, she frequently makes poor food choices, and she struggles with emotional eating.  Depression Screen Abigail Singh's Food and Mood (modified PHQ-9) score was 22.  Depression screen PHQ 2/9 12/13/2020  Decreased Interest 3  Down,  Depressed, Hopeless 3  PHQ - 2 Score 6  Altered sleeping 3  Tired, decreased energy 3  Change in appetite 2  Feeling bad or failure about yourself  1  Trouble concentrating 1  Moving slowly or fidgety/restless 3  Suicidal thoughts 3  PHQ-9 Score 22  Difficult doing work/chores Somewhat difficult  Some recent data might be hidden   Subjective:   1. Chronic fatigue Abigail Singh admits to daytime somnolence and admits to waking up still tired. Patent has a history of symptoms of daytime fatigue, morning fatigue, and morning headache. [name] generally gets 6 hours of sleep per night, and states that she has poor sleep quality. Snoring is present. Apneic episodes are present. Epworth Sleepiness Score is 7. She has been seen by multiple specialist- pulmonology, rheumatology, and cardiology.  2. SOB (shortness of breath) on exertion Abigail Singh notes increasing shortness of breath with exercising and seems to be worsening over time with weight gain. She notes getting out of breath sooner with activity than she used to. This has gotten worse recently. Abigail Singh denies shortness of breath at rest or orthopnea. She has been seen by multiple specialist- pulmonology, rheumatology, and cardiology.  3. Tachycardia Abigail Singh sees Dr. Harriet Singh of cardiology. Her recent echo was essentially within normal limits, except grade I diastolic dysfunction.  4. Hyperlipidemia, unspecified hyperlipidemia type Course: Not at goal. Lipid-lowering medications: None.   Lab Results  Component Value Date   CHOL 181 12/13/2020   HDL 35 (L) 12/13/2020   LDLCALC 122 (H) 12/13/2020   TRIG 134 12/13/2020   CHOLHDL 5  07/11/2020   Lab Results  Component Value Date   ALT 17 12/13/2020   AST 19 12/13/2020   ALKPHOS 118 12/13/2020   BILITOT 0.3 12/13/2020   The 10-year ASCVD risk score Abigail Singh., et al., 2013) is: 1.2%   Values used to calculate the score:     Age: 64 years     Sex: Female     Is Non-Hispanic African  American: No     Diabetic: No     Tobacco smoker: No     Systolic Blood Pressure: 123456 mmHg     Is BP treated: Yes     HDL Cholesterol: 35 mg/dL     Total Cholesterol: 181 mg/dL  5. Fibromyalgia Abigail Singh has chronic pain with chronic fatigue syndrome, which limits her activity levels. Post COVID, meds caused her to gain 100 lbs.  6. 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 Pt has depression, generalized anxiety disorder, and ADHD/ADD. She sees therapist every 2 weeks.  7. Vitamin D deficiency She is currently taking OTC vitamin D 2000 IU each day. She denies nausea, vomiting or muscle weakness.  Lab Results  Component Value Date   VD25OH 55.7 12/13/2020   VD25OH 53.78 07/11/2020   VD25OH 47.88 07/15/2019   Assessment/Plan:   1. Chronic fatigue [name] does feel that her weight is causing her energy to be lower than it should be. Fatigue may be related to obesity, depression or many other causes. Labs will be ordered, and in the meanwhile, Abigail Singh will focus on self care including making healthy food choices, increasing physical activity and focusing on stress reduction. Check labs today.  - Insulin, random - Comprehensive metabolic panel - Hemoglobin A1c - Vitamin B12 - TSH - Folate - CBC With Differential - EKG 12-Lead - T4, free  2. SOB (shortness of breath) on exertion Abigail Singh does feel that she gets out of breath more easily that she used to when she exercises. Abigail Singh's shortness of breath appears to be obesity related and exercise induced. She has agreed to work on weight loss and gradually increase exercise to treat her exercise induced shortness of breath. Will continue to monitor closely.  3. Tachycardia Continue with cardiology regarding heart rate and BP issues. Pt has questions about her echo results and interpretation of it. Advised to contact her Cardiologist  - Check Thyroid labs  4. Hyperlipidemia, unspecified hyperlipidemia type Plan:  Dietary changes: Increase soluble fiber, decrease simple carbohydrates, decrease saturated fat. Exercise changes: Moderate to vigorous-intensity aerobic activity 150 minutes per week or as tolerated. We will continue to monitor along with PCP/specialists as it pertains to her weight loss journey. Check labs today.  - Lipid Panel With LDL/HDL Ratio  5. Fibromyalgia Follow up with rheumatology and other specialists for condition. Focus on prudent nutritional plan and weight loss.  6. Mood disorder ([place]) with emotional eating Continue with therapist at [facility] every 2 weeks. Pt declines referral to Dr. Mallie Mussel and we  will monitor closely.  Continue txmnt plan per PCP and [facility] specialists.  Check labs.  7. Vitamin D deficiency Low Vitamin D level contributes to fatigue and are associated with obesity, breast, and colon cancer. She agrees to continue to take OTC Vitamin D 2,000 IU daily and will follow-up for routine testing of Vitamin D, at least 2-3 times per year to avoid over-replacement. Check labs today.  - VITAMIN D 25 Hydroxy (Vit-D Deficiency, Fractures)  8. At risk for impaired metabolic function Due to Tationa's current  state of health and medical condition(s), she is at a significantly higher risk for impaired metabolic function.   At least 12 minutes was spent on counseling Syliva about these concerns today.  This places the patient at a much greater risk to subsequently develop cardio-pulmonary conditions that can negatively affect the patient's quality of life.  I stressed the importance of reversing these risks factors.  The initial goal is to lose at least 5-10% of starting weight to help reduce risk factors.  Counseling:  Intensive lifestyle modifications discussed with Earma as the most appropriate first line treatment.  she will continue to work on diet, exercise, and weight loss efforts.  We will continue to reassess these conditions on a fairly regular basis in an attempt  to decrease the patient's overall morbidity and mortality.  9. Obesity with current BMI 36.0  Kynlee is currently in the action stage of change and her goal is to continue with weight loss efforts. I recommend Ynez begin the structured treatment plan as follows:  She has agreed to the Category 2 Plan.  Exercise goals:  As is    Behavioral modification strategies: decreasing simple carbohydrates.  She was informed of the importance of frequent follow-up visits to maximize her success with intensive lifestyle modifications for her multiple health conditions. She was informed we would discuss her lab results at her next visit unless there is a critical issue that needs to be addressed sooner. Francessca agreed to keep her next visit at the agreed upon time to discuss these results.  Objective:   Blood pressure 104/70, pulse 73, temperature 97.7 F (36.5 C), height '5\' 4"'$  (1.626 m), weight 209 lb (94.8 kg), SpO2 97 %. Body mass index is 35.87 kg/m.  EKG: Normal sinus rhythm, rate 85.  Indirect Calorimeter completed today shows a VO2 of 284 and a REE of 1979.  Her calculated basal metabolic rate is [id number] thus her basal metabolic rate is better than expected.  General: Cooperative, alert, well developed, in no acute distress. HEENT: Conjunctivae and lids unremarkable. Cardiovascular: Regular rhythm.  Lungs: Normal work of breathing. Neurologic: No focal deficits.   Lab Results  Component Value Date   CREATININE 0.82 12/13/2020   BUN 13 12/13/2020   NA 141 12/13/2020   K 4.8 12/13/2020   CL 106 12/13/2020   CO2 22 12/13/2020   Lab Results  Component Value Date   ALT 17 12/13/2020   AST 19 12/13/2020   ALKPHOS 118 12/13/2020   BILITOT 0.3 12/13/2020   Lab Results  Component Value Date   HGBA1C 5.8 (H) 12/13/2020   HGBA1C 5.6 12/05/2017   Lab Results  Component Value Date   INSULIN WILL FOLLOW 12/13/2020   Lab Results  Component Value Date   TSH 1.210 12/13/2020    Lab Results  Component Value Date   CHOL 181 12/13/2020   HDL 35 (L) 12/13/2020   LDLCALC 122 (H) 12/13/2020   TRIG 134 12/13/2020   CHOLHDL 5 07/11/2020   Lab Results  Component Value Date   WBC 6.4 12/13/2020   HGB 13.3 12/13/2020   HCT 40.6 12/13/2020   MCV 85 12/13/2020   PLT 318.0 07/08/2020    Attestation Statements:   Reviewed by clinician on day of visit: allergies, medications, problem list, medical history, surgical history, family history, social history, and previous encounter notes.  Coral Ceo, CMA, am acting as transcriptionist for [employer], DO.  I have reviewed the above documentation for accuracy and completeness, and  I agree with the above. Marjory Sneddon, D.O.  The [place] was signed into law in 2016 which includes the topic of electronic health records.  This provides immediate access to information in MyChart.  This includes consultation notes, operative notes, office notes, lab results and pathology reports.  If you have any questions about what you read please let us know at your next visit so we can discuss your concerns and take corrective action if need be.  We are right here with you.

## 2020-12-15 ENCOUNTER — Encounter: Payer: Self-pay | Admitting: Registered Nurse

## 2020-12-15 NOTE — Progress Notes (Signed)
Letter with updated restrictions sent  [name], NP

## 2020-12-21 NOTE — Telephone Encounter (Signed)
Patient note was signed and picked up.

## 2020-12-27 ENCOUNTER — Ambulatory Visit (INDEPENDENT_AMBULATORY_CARE_PROVIDER_SITE_OTHER): Payer: BC Managed Care – PPO | Admitting: Family Medicine

## 2020-12-27 ENCOUNTER — Encounter (INDEPENDENT_AMBULATORY_CARE_PROVIDER_SITE_OTHER): Payer: Self-pay | Admitting: Family Medicine

## 2020-12-27 ENCOUNTER — Other Ambulatory Visit: Payer: Self-pay

## 2020-12-27 VITALS — BP 119/83 | HR 94 | Temp 98.3°F | Ht 64.0 in | Wt 207.0 lb

## 2020-12-27 DIAGNOSIS — R7303 Prediabetes: Secondary | ICD-10-CM | POA: Diagnosis not present

## 2020-12-27 DIAGNOSIS — I10 Essential (primary) hypertension: Secondary | ICD-10-CM

## 2020-12-27 DIAGNOSIS — E538 Deficiency of other specified B group vitamins: Secondary | ICD-10-CM | POA: Diagnosis not present

## 2020-12-27 DIAGNOSIS — Z9189 Other specified personal risk factors, not elsewhere classified: Secondary | ICD-10-CM

## 2020-12-27 DIAGNOSIS — E559 Vitamin D deficiency, unspecified: Secondary | ICD-10-CM | POA: Diagnosis not present

## 2020-12-27 DIAGNOSIS — E7849 Other hyperlipidemia: Secondary | ICD-10-CM | POA: Diagnosis not present

## 2020-12-27 DIAGNOSIS — E66812 Obesity, class 2: Secondary | ICD-10-CM

## 2020-12-27 DIAGNOSIS — Z6835 Body mass index (BMI) 35.0-35.9, adult: Secondary | ICD-10-CM

## 2020-12-28 ENCOUNTER — Other Ambulatory Visit: Payer: Self-pay

## 2020-12-28 ENCOUNTER — Ambulatory Visit (INDEPENDENT_AMBULATORY_CARE_PROVIDER_SITE_OTHER): Payer: BC Managed Care – PPO | Admitting: Psychology

## 2020-12-28 DIAGNOSIS — F4310 Post-traumatic stress disorder, unspecified: Secondary | ICD-10-CM

## 2020-12-28 NOTE — Progress Notes (Signed)
Chief Complaint:   OBESITY Abigail Singh is here to discuss her progress with her obesity treatment plan along with follow-up of her obesity related diagnoses. Abigail Singh is on the Category 2 Plan and states she is following her eating plan approximately 90% of the time. Abigail Singh states she is swimming and waling 20 minutes 6 times per week.  Today's visit was #: 2 Starting weight: 209 lbs Starting date: 12/13/2020 Today's weight: 207 lbs Today's date: 12/27/2020 Total lbs lost to date: 2 Total lbs lost since last in-office visit: 2  Interim History: Abigail Singh is here today for her first follow-up office visit since starting the program with [place].  All blood work/ lab tests that were recently ordered by myself or an outside provider were reviewed with patient today per their request.   Extended time was spent counseling her on all new disease processes that were discovered or preexisting ones that are affected by BMI.  she understands that many of these abnormalities will need to monitored regularly along with the current treatment plan of prudent dietary changes, in which we are making each and every office visit, to improve these health parameters. Abigail Singh is here for a follow up office visit.  We reviewed her meal plan and questions were answered.  Patient's food recall appears to be accurate and consistent with what is on plan when she is following it.   When eating on plan, her hunger and cravings are well controlled.   Abigail Singh weighed all her proteins but says it's difficult to eat all at once. She can't get rid of sodas. She has tried to decrease the amount she drinks. She is not counting towards snack calories.  Assessment/Plan:  No orders of the defined types were placed in this encounter.   Medications Discontinued During This Encounter  Medication Reason   gabapentin (NEURONTIN) 300 MG capsule Error     No orders of the defined types were placed in this encounter.     1. Pre-diabetes New. Discussed labs with patient today. Not at goal. Goal is HgbA1c < 5.7.  Medication: None.    Plan:  She will continue to focus on protein-rich, low simple carbohydrate foods. We reviewed the importance of hydration, regular exercise for stress reduction, and restorative sleep.  Handout: Pre-diabetes and Insulin Resistance after long discussion with pt.  Lab Results  Component Value Date   HGBA1C 5.8 (H) 12/13/2020   Lab Results  Component Value Date   INSULIN 13.1 12/13/2020   2. Essential hypertension At goal. Medications: Toprol. Tachycardia- controlled. ECG reviewed with pt and NSR; no abnormalities appreciated.   Plan: Continue medication per cardiology/PCP. Avoid buying foods that are: processed, frozen, or prepackaged to avoid excess salt. We will continue to monitor closely alongside her PCP and/or Specialist.  Regular follow up with PCP and specialists was also encouraged.   BP Readings from Last 3 Encounters:  12/27/20 119/83  12/13/20 104/70  11/28/20 114/76   Lab Results  Component Value Date   CREATININE 0.82 12/13/2020   3. Other hyperlipidemia Worsening. Discussed labs with patient today. Course: Not at goal. Lipid-lowering medications: None.  Elevated LDL and low HDL; prior, pt always had normal HDL levels.  Plan: Essentially no change from prior labs. No need for meds at this time. Dietary changes: Increase soluble fiber, decrease simple carbohydrates, decrease saturated fat. Exercise changes: Moderate to vigorous-intensity aerobic activity 150 minutes per week or as tolerated. We will continue to monitor along  with PCP/specialists as it pertains to her weight loss journey.  Lab Results  Component Value Date   CHOL 181 12/13/2020   HDL 35 (L) 12/13/2020   LDLCALC 122 (H) 12/13/2020   TRIG 134 12/13/2020   CHOLHDL 5 07/11/2020   Lab Results  Component Value Date   ALT 17 12/13/2020   AST 19 12/13/2020   ALKPHOS 118 12/13/2020    BILITOT 0.3 12/13/2020   The 10-year ASCVD risk score [name], et al., 2013) is: 1.6%   Values used to calculate the score:     Age: 45 years     Sex: Female     Is Non-Hispanic African American: No     Diabetic: No     Tobacco smoker: No     Systolic Blood Pressure: 123456 mmHg     Is BP treated: Yes     HDL Cholesterol: 35 mg/dL     Total Cholesterol: 181 mg/dL  4. B12 deficiency Discussed labs with patient today. At goal.  Lab Results  Component Value Date   VITAMINB12 614 12/13/2020   Supplementation: OTC B12 500 mcg. Continue current treatment recheck her B12 level with the next set of labs.   5. Vitamin D deficiency Discussed labs with patient today. At goal.   Plan: Continue current OTC vitamin D 2,000 IU supplementation.  Follow-up for routine testing of Vitamin D, at least 2-3 times per year to avoid over-replacement.  Lab Results  Component Value Date   VD25OH 55.7 12/13/2020   VD25OH 53.78 07/11/2020   VD25OH 47.88 07/15/2019   6. At risk for diabetes mellitus - Abigail Singh was given diabetes prevention education and counseling today of more than 23 minutes.  - Counseled patient on pathophysiology of disease and meaning/ implication of lab results.  - Reviewed how certain foods can either stimulate or inhibit insulin release, and subsequently affect hunger pathways  - Importance of following a healthy meal plan with limiting amounts of simple carbohydrates discussed with patient - Effects of regular aerobic exercise on blood sugar regulation reviewed and encouraged an eventual goal of 30 min 5d/week or more as a minimum.  - Briefly discussed treatment options, which always include dietary and lifestyle modification as first line.   - Handouts provided at patient's desire and/or told to go online to the American Diabetes Association website for further information.  7. Obesity with current BMI of 35.6  Abigail Singh is currently in the action stage of change. As  such, her goal is to continue with weight loss efforts. She has agreed to the Category 2 Plan.   Exercise goals:  As is  Behavioral modification strategies: increasing lean protein intake, decreasing simple carbohydrates, and decreasing liquid calories.  Abigail Singh has agreed to follow-up with our clinic in 3 weeks. She was informed of the importance of frequent follow-up visits to maximize her success with intensive lifestyle modifications for her multiple health conditions.   Objective:   Blood pressure 119/83, pulse 94, temperature 98.3 F (36.8 C), height '5\' 4"'$  (1.626 m), weight 207 lb (93.9 kg), SpO2 98 %. Body mass index is 35.53 kg/m.  General: Cooperative, alert, well developed, in no acute distress. HEENT: Conjunctivae and lids unremarkable. Cardiovascular: Regular rhythm.  Lungs: Normal work of breathing. Neurologic: No focal deficits.   Lab Results  Component Value Date   CREATININE 0.82 12/13/2020   BUN 13 12/13/2020   NA 141 12/13/2020   K 4.8 12/13/2020   CL 106 12/13/2020   CO2 22  12/13/2020   Lab Results  Component Value Date   ALT 17 12/13/2020   AST 19 12/13/2020   ALKPHOS 118 12/13/2020   BILITOT 0.3 12/13/2020   Lab Results  Component Value Date   HGBA1C 5.8 (H) 12/13/2020   HGBA1C 5.6 12/05/2017   Lab Results  Component Value Date   INSULIN 13.1 12/13/2020   Lab Results  Component Value Date   TSH 1.210 12/13/2020   Lab Results  Component Value Date   CHOL 181 12/13/2020   HDL 35 (L) 12/13/2020   LDLCALC 122 (H) 12/13/2020   TRIG 134 12/13/2020   CHOLHDL 5 07/11/2020   Lab Results  Component Value Date   VD25OH 55.7 12/13/2020   VD25OH 53.78 07/11/2020   VD25OH 47.88 07/15/2019   Lab Results  Component Value Date   WBC 6.4 12/13/2020   HGB 13.3 12/13/2020   HCT 40.6 12/13/2020   MCV 85 12/13/2020   PLT 318.0 07/08/2020    Attestation Statements:   Reviewed by clinician on day of visit: allergies, medications, problem list,  medical history, surgical history, family history, social history, and previous encounter notes.  Coral Ceo, CMA, am acting as transcriptionist for [employer], DO.  I have reviewed the above documentation for accuracy and completeness, and I agree with the above. Marjory Sneddon, D.O.  The [place] was signed into law in 2016 which includes the topic of electronic health records.  This provides immediate access to information in MyChart.  This includes consultation notes, operative notes, office notes, lab results and pathology reports.  If you have any questions about what you read please let us know at your next visit so we can discuss your concerns and take corrective action if need be.  We are right here with you.

## 2021-01-03 ENCOUNTER — Other Ambulatory Visit: Payer: Self-pay

## 2021-01-03 ENCOUNTER — Encounter: Payer: Self-pay | Admitting: Neurology

## 2021-01-03 ENCOUNTER — Ambulatory Visit (INDEPENDENT_AMBULATORY_CARE_PROVIDER_SITE_OTHER): Payer: BC Managed Care – PPO | Admitting: Neurology

## 2021-01-03 ENCOUNTER — Other Ambulatory Visit: Payer: Self-pay | Admitting: Neurology

## 2021-01-03 VITALS — BP 126/84 | HR 117 | Ht 63.0 in | Wt 212.6 lb

## 2021-01-03 DIAGNOSIS — G43719 Chronic migraine without aura, intractable, without status migrainosus: Secondary | ICD-10-CM | POA: Diagnosis not present

## 2021-01-03 DIAGNOSIS — M797 Fibromyalgia: Secondary | ICD-10-CM

## 2021-01-03 DIAGNOSIS — R202 Paresthesia of skin: Secondary | ICD-10-CM

## 2021-01-03 DIAGNOSIS — R4789 Other speech disturbances: Secondary | ICD-10-CM | POA: Diagnosis not present

## 2021-01-03 MED ORDER — AIMOVIG 140 MG/ML ~~LOC~~ SOAJ: 140.0000 mg | SUBCUTANEOUS | 4 refills | Status: DC

## 2021-01-03 NOTE — Progress Notes (Signed)
Reason for visit: Migraine headache, speech alterations, diffuse pain and sensory alteration  Abigail Singh is an 45 y.o. female  History of present illness:  Ms. Wisener is a 45 year old right-handed white female with a history of migraine headaches.  The patient past has been treated with Topamax, but this was stopped as she was having troubles with speech and language, but this has persisted stopping the Topamax.  The patient has been on beta-blocker with metoprolol, but she continues to have 2 or 3 headaches a week.  She works as a Pharmacist, hospital, she has been out of work until just recently because of long-lasting post-COVID symptoms.  She had COVID in March 2021 and again in January 2022.  She had severe coughing issues and difficulty with swallowing.  She had increased headache frequency.  Over the last couple years, she has had elevations in baseline heart rate.  The patient has exercise intolerance as well.  She has diffuse achy pains throughout the chest and arms and legs with shooting pains that may go down the legs.  She has tingling in the legs and in the hands and numbness in the hands.  She has gait instability and occasional falls.  She has dizziness with standing.  She has been treated with gabapentin up to 1200 mg daily but this offered no benefit and she went off the medication.  She has been seen through another neurologist, Dr. Primus Bravo, who told her that she had a psychogenic illness.  MRI of the brain was done as she was having pain in the left face, and she has bilateral jaw discomfort.  MRI of the brain was unremarkable.  The patient is felt to have a history of fibromyalgia, OCD, and posttraumatic stress disorder.  She has had a positive ANA evaluation, she has a hemoglobin A1c was slightly elevated at 5.8.  She had a significant mount of weight gain after she caught COVID.  She is trying to lose weight at this point.  She comes to this office with these multiple somatic  complaints.  Past Medical History:  Diagnosis Date   ADD (attention deficit disorder)    Allergy    sseasonal   Anemia    as teenager   Anxiety    Autonomic instability    Back pain    Chest pain    Chewing difficulty    Chronic fatigue    COVID-19 long hauler    Depression    Diastolic dysfunction [id number]   Edema of both lower extremities    Fibroid    Fibromyalgia    Food allergy    GERD (gastroesophageal reflux disease)    Heartburn in pregnancy    History of fibromyalgia    Hypertension    IBS (irritable bowel syndrome)    Joint pain    Lactose intolerance    Migraines    Neuromuscular disorder (HCC)    fibromyalgia   Other hyperlipidemia    Pneumonia 05/2020   Postpartum care following cesarean delivery 03/17/2012   Preterm labor    SOB (shortness of breath)    Status post cesarean delivery (repeat x 2) 03/17/2012   Swallowing difficulty    Urinary tract infection    Vitamin D deficiency     Past Surgical History:  Procedure Laterality Date   abnormal cells in colon/tatoo area     2012   CESAREAN SECTION  2009   CESAREAN SECTION  03/17/2012   Procedure: CESAREAN SECTION;  Surgeon: Alanda Slim A  Garwin Brothers, MD;  Location: [facility];  Service: Obstetrics;  Laterality: N/A;  Repeat C/S.  EDD: 03/23/12   COLONOSCOPY     DILATION AND CURETTAGE OF UTERUS     POLYPECTOMY     tubes in ears     WISDOM TOOTH EXTRACTION      Family History  Problem Relation Age of Onset   Hyperlipidemia Mother    Diabetes Mother    High Cholesterol Mother    Depression Mother    Hyperlipidemia Father    Diabetes Father    Food Allergy Father    High Cholesterol Father    Heart attack Father    Hypertension Father    Diabetes Maternal Grandmother    Colon polyps Maternal Grandmother    Diabetes Maternal Grandfather    Colon polyps Maternal Grandfather    Heart disease Maternal Grandfather    Colon cancer Paternal Grandmother    Heart disease Paternal Grandfather    Allergic  rhinitis Daughter    Eczema Daughter    Anxiety disorder Daughter    Lactose intolerance Daughter    OCD Daughter    Anxiety disorder Daughter    Lactose intolerance Daughter    Anxiety disorder Daughter    Asthma Daughter    Lactose intolerance Daughter    Diabetes Maternal Aunt    Heart failure Other        Grandfather   Heart attack Other        x's 4 Grandfather   Stroke Other        x's 1 Grandfather   Diabetes Other    Angioedema Neg Hx    Immunodeficiency Neg Hx     Social history:  reports that she quit smoking about 23 years ago. Her smoking use included cigarettes. She has a 11.00 pack-year smoking history. She has never used smokeless tobacco. She reports that she does not drink alcohol and does not use drugs.    Allergies  Allergen Reactions   Aspirin Anaphylaxis    Pt reports being able to take ibuprofen with no problems.   Citrus Hives and Swelling   Codeine Other (See Comments)    halucinations   Eggs Or Egg-Derived Products Other (See Comments)    Patient states ok with flu shots, etc. No anaphalaxis.  Eggs give stomach cramps with diarrhea. After diarrhea ok.-pt eats baked goods that contains eggs with no issues    Erythromycin Rash   Lactose Intolerance (Gi) Diarrhea    Diarrhea with lactose.   Penicillins Rash   Sulfonamide Derivatives Rash    Childhood reaction, has taken recently with only stomach irritation.    Medications:  Prior to Admission medications   Medication Sig Start Date End Date Taking? Authorizing Provider  acetaminophen (TYLENOL) 325 MG tablet Take 650 mg by mouth every 6 (six) hours as needed for moderate pain.   Yes [provider]  albuterol (VENTOLIN HFA) 108 (90 Base) MCG/ACT inhaler Inhale 1 puff into the lungs as needed for wheezing or shortness of breath.   Yes [provider]  amphetamine-dextroamphetamine (ADDERALL XR) 20 MG 24 hr capsule Take 1 capsule (20 mg total) by mouth every morning. 12/12/20  Yes  Dutch Quint B, FNP  Ascorbic Acid (VITAMIN C) 1000 MG tablet Take 1,000 mg by mouth daily.   Yes [provider]  BIOTIN PO Take 200 mg by mouth daily.   Yes [provider]  Budesonide-Formoterol Fumarate (SYMBICORT IN) Inhale 1 puff into the lungs as needed (  shortness of breath).   Yes [provider]  Calcium Carbonate (CALCIUM 500 PO) Take 500 mg by mouth daily.   Yes [provider]  chlorpheniramine (CHLOR-TRIMETON) 4 MG tablet Take 4 mg by mouth every 4 (four) hours as needed. 2AT BEDTIME   Yes [provider]  Cholecalciferol (VITAMIN D) 50 MCG (2000 UT) CAPS Take 2,000 Units by mouth daily.   Yes [provider]  EPINEPHrine 0.3 mg/0.3 mL IJ SOAJ injection Inject 0.3 mLs (0.3 mg total) into the muscle as needed for anaphylaxis. 06/09/18  Yes Midge Minium, MD  famotidine (PEPCID) 20 MG tablet Take 20 mg by mouth 2 (two) times daily.   Yes [provider]  furosemide (LASIX) 20 MG tablet Take 1 tablet (20 mg total) by mouth as needed (Bilateral leg swelling). 11/11/20 02/09/21 Yes Tobb, Kardie, DO  ibuprofen (ADVIL) 200 MG tablet Take 400 mg by mouth as needed for moderate pain.   Yes [provider]  medroxyPROGESTERone (DEPO-PROVERA) 150 MG/ML injection SMARTSIG:1 Milliliter(s) IM Every 12 Weeks 08/20/20  Yes [provider]  metoprolol succinate (TOPROL XL) 25 MG 24 hr tablet Take 1 tablet (25 mg total) by mouth at bedtime. 12/13/20  Yes Tobb, Kardie, DO  metoprolol succinate (TOPROL-XL) 50 MG 24 hr tablet Take 1 tablet (50 mg total) by mouth daily. Take with or immediately following a meal. 09/27/20 01/03/21 Yes Tobb, Kardie, DO  MULTIPLE VITAMIN PO Take 1 capsule by mouth daily.   Yes [provider]  pantoprazole (PROTONIX) 40 MG tablet TAKE 1 TABLET DAILY 30-60 MIN BEFORE 1ST MEAL OF THE DAY 12/07/20  Yes Tanda Rockers, MD  potassium chloride (KLOR-CON) 10 MEQ tablet Take 1 tablet (10 mEq total) by  mouth as needed (Bilateral leg swelling). 11/11/20 02/09/21 Yes Tobb, Kardie, DO  Probiotic Product (PROBIOTIC-10 PO) Take by mouth.   Yes [provider]  QUERCETIN PO Take by mouth daily.   Yes [provider]  rizatriptan (MAXALT-MLT) 10 MG disintegrating tablet Take 1 tablet (10 mg total) by mouth 3 (three) times daily as needed for migraine. May repeat in 2 hours if needed 06/22/19  Yes Kathrynn Ducking, MD  traZODone (DESYREL) 50 MG tablet TAKE 0.5-1 TABLETS BY MOUTH AT BEDTIME AS NEEDED FOR SLEEP. 05/23/20  Yes Midge Minium, MD  triamcinolone (NASACORT) 55 MCG/ACT AERO nasal inhaler Place 2 sprays into the nose daily. 11/24/19  Yes Brunetta Jeans, PA-C  vitamin B-12 (CYANOCOBALAMIN) 500 MCG tablet Take 500 mcg by mouth daily.   Yes [provider]  Zinc 30 MG TABS Take 30 mg by mouth daily.   Yes [provider]    ROS:  Out of a complete 14 system review of symptoms, the patient complains only of the following symptoms, and all other reviewed systems are negative.  Diffuse body aches and pains, numbness, tingling Difficulty with speech and swallowing Headache Increased heart rate  Blood pressure 126/84, pulse (!) 117, height '5\' 3"'$  (1.6 m), weight 212 lb 9.6 oz (96.4 kg).  Physical Exam  General: The patient is alert and cooperative at the time of the examination.  The patient is moderately obese.  Skin: No significant peripheral edema is noted.   Neurologic Exam  Mental status: The patient is alert and oriented x 3 at the time of the examination. The patient has apparent normal recent and remote memory, with an apparently normal attention span and concentration ability.   Cranial nerves: Facial symmetry is  present. Speech is normal, no aphasia or dysarthria is noted. Extraocular movements are full. Visual fields are full.  Pupils are equal, round, and reactive to light.  Discs are flat bilaterally.  Motor: The patient has good  strength in all 4 extremities.  Sensory examination: Soft touch sensation is symmetric on the face, arms, and legs.  Coordination: The patient has good finger-nose-finger and heel-to-shin bilaterally.  Gait and station: The patient has a normal gait. Tandem gait is normal. Romberg is negative. No drift is seen.  Reflexes: Deep tendon reflexes are symmetric, they are normal throughout.   MRI brain 09/23/20:  Impression    1.  No acute intracranial abnormality.  2.  No mass identified in the region of the jugular foramina (where the glossopharyngeal nerves exit the skull).  3.  Blood vessels contacts and mildly displace the right trigeminal nerve root entry zone and cisternal segment of the right hypoglossal nerve.  4.  Nonspecific small mastoid effusions.   Assessment/Plan:  1.  Migraine headache  2.  Increased heart rate, exercise intolerance, questionable POTS  3.  Diffuse pain, sensory alteration, all 4 extremities  4.  Reported speech abnormality, difficulty swallowing  5.  History of fibromyalgia  The patient has multiple somatic complaints.  Many of them may be psychogenic in nature, but the patient is having new symptoms as time goes on.  The patient will be sent for MRI of the cervical spine given the pain and sensory complaints on all 4 extremities.  She will be sent for further blood work today.  She will be placed on Aimovig for her migraine headache.  She will be sent for speech therapy for an evaluation.  She will follow-up here in 4 months, in the future she can be followed through Dr. Felecia Shelling.  Jill Alexanders MD 01/03/2021 8:18 AM  [facility] [address] [place], [place] [address]  Phone [phone] Fax [phone]

## 2021-01-03 NOTE — Patient Instructions (Signed)
We will start [place] for headache prevention.

## 2021-01-05 ENCOUNTER — Telehealth: Payer: Self-pay | Admitting: Neurology

## 2021-01-05 LAB — LYME DISEASE SEROLOGY W/REFLEX: Lyme Total Antibody EIA: NEGATIVE

## 2021-01-05 LAB — RPR: RPR Ser Ql: NONREACTIVE

## 2021-01-05 LAB — ANGIOTENSIN CONVERTING ENZYME: Angio Convert Enzyme: 27 U/L (ref 14–82)

## 2021-01-05 LAB — HIV ANTIBODY (ROUTINE TESTING W REFLEX): HIV Screen 4th Generation wRfx: NONREACTIVE

## 2021-01-05 LAB — COPPER, SERUM: Copper: 143 ug/dL (ref 80–158)

## 2021-01-05 LAB — THYROGLOBULIN ANTIBODY: Thyroglobulin Antibody: <1 IU/mL (ref 0.0–0.9)

## 2021-01-05 LAB — THYROID PEROXIDASE ANTIBODY: Thyroperoxidase Ab SerPl-aCnc: 10 IU/mL (ref 0–34)

## 2021-01-05 LAB — SEDIMENTATION RATE: Sed Rate: 26 mm/hr (ref 0–32)

## 2021-01-05 NOTE — Telephone Encounter (Signed)
LVM for pt to call back to schedule EE   [employer] auth: [id number] (exp. 01/04/21 to 02/02/21)

## 2021-01-10 NOTE — Telephone Encounter (Signed)
Patient returned my call she is scheduled at [facility] for 01/18/21.

## 2021-01-11 ENCOUNTER — Ambulatory Visit (INDEPENDENT_AMBULATORY_CARE_PROVIDER_SITE_OTHER): Payer: Self-pay | Admitting: Psychology

## 2021-01-11 ENCOUNTER — Other Ambulatory Visit: Payer: Self-pay

## 2021-01-11 DIAGNOSIS — F4310 Post-traumatic stress disorder, unspecified: Secondary | ICD-10-CM

## 2021-01-13 ENCOUNTER — Other Ambulatory Visit: Payer: Self-pay

## 2021-01-13 ENCOUNTER — Encounter: Payer: Self-pay | Admitting: Family Medicine

## 2021-01-13 DIAGNOSIS — F900 Attention-deficit hyperactivity disorder, predominantly inattentive type: Secondary | ICD-10-CM

## 2021-01-13 MED ORDER — AMPHETAMINE-DEXTROAMPHET ER 20 MG PO CP24: 20.0000 mg | ORAL_CAPSULE | ORAL | 0 refills | Status: DC

## 2021-01-13 NOTE — Telephone Encounter (Signed)
  Good morning. I don't know if you are back at work. If not, I hope someone reads this message. I need a refil of my Adderall. I will run out this weekend. I've tried to request a refill multiple times this week but the system says a request isn't available at this time. It says that for all my medications.    LFD 12/12/20 #30 with no refills LOV 11/28/20 NOV 07/13/21

## 2021-01-16 ENCOUNTER — Encounter (INDEPENDENT_AMBULATORY_CARE_PROVIDER_SITE_OTHER): Payer: Self-pay

## 2021-01-17 ENCOUNTER — Ambulatory Visit (INDEPENDENT_AMBULATORY_CARE_PROVIDER_SITE_OTHER): Payer: BC Managed Care – PPO | Admitting: Adult Health

## 2021-01-17 ENCOUNTER — Encounter (INDEPENDENT_AMBULATORY_CARE_PROVIDER_SITE_OTHER): Payer: Self-pay | Admitting: Adult Health

## 2021-01-17 ENCOUNTER — Other Ambulatory Visit: Payer: Self-pay

## 2021-01-17 VITALS — BP 110/74 | HR 94 | Temp 97.8°F | Ht 64.0 in | Wt 207.0 lb

## 2021-01-17 DIAGNOSIS — U099 Post covid-19 condition, unspecified: Secondary | ICD-10-CM

## 2021-01-17 DIAGNOSIS — R7303 Prediabetes: Secondary | ICD-10-CM | POA: Diagnosis not present

## 2021-01-17 DIAGNOSIS — Z6835 Body mass index (BMI) 35.0-35.9, adult: Secondary | ICD-10-CM

## 2021-01-18 ENCOUNTER — Ambulatory Visit (INDEPENDENT_AMBULATORY_CARE_PROVIDER_SITE_OTHER): Payer: BC Managed Care – PPO

## 2021-01-18 DIAGNOSIS — R202 Paresthesia of skin: Secondary | ICD-10-CM | POA: Diagnosis not present

## 2021-01-18 MED ORDER — GADOBENATE DIMEGLUMINE 529 MG/ML IV SOLN
20.0000 mL | Freq: Once | INTRAVENOUS | Status: AC | PRN
  Administered 2021-01-18: 20 mL via INTRAVENOUS

## 2021-01-18 NOTE — Progress Notes (Signed)
Chief Complaint:   OBESITY Abigail Singh is here to discuss her progress with her obesity treatment plan along with follow-up of her obesity related diagnoses. Abigail Singh is on the Category 2 Plan and states she is following her eating plan approximately 80-85% of the time. Abigail Singh states she is walking for 20 minutes 7 times per week.  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  Subjective:   1. Prediabetes On 12/13/2020, A1c was 5.8. Her mother/father are both diabetic. She is not on any BG lowering medications.  Lab Results  Component Value Date   HGBA1C 5.8 (H) 12/13/2020   Lab Results  Component Value Date   INSULIN 13.1 12/13/2020   2. Long COVID COVID-19 infection in March 2021, January 2022. PCP released her back to work on 12/13/2020. Lowest ambulatory saturation 87% when walking in school. She denies dyspnea at present.  Assessment/Plan:   1. Prediabetes Monitor labs every 3 months.  2. Long COVID Remain well hydrated, follow Category 2 meal plan, continue regular walking. F/u with PCP as needed.  3. Obesity with current BMI of 35.3  Abigail Singh is currently in the action stage of change. As such, her goal is to continue with weight loss efforts. She has agreed to the Category 2 Plan and keeping a food journal and adhering to recommended goals of 300-400 calories and 30+ grams of protein at lunch and 400-500 calories and 35+ grams of protein at dinner.   Exercise goals:  As is.  Behavioral modification strategies: increasing lean protein intake, decreasing simple carbohydrates, meal planning and cooking strategies, keeping healthy foods in the  home, and planning for success.  Abigail Singh has agreed to follow-up with our clinic in 2 weeks. She was informed of the importance of frequent follow-up visits to maximize her success with intensive lifestyle modifications for her multiple health conditions.   Objective:   Blood pressure 110/74, pulse 94, temperature 97.8 F (36.6 C), height '5\' 4"'$  (1.626 m), weight 207 lb (93.9 kg), SpO2 97 %. Body mass index is 35.53 kg/m.  General: Cooperative, alert, well developed, in no acute distress. HEENT: Conjunctivae and lids unremarkable. Cardiovascular: Regular rhythm.  Lungs: Normal work of breathing. Neurologic: No focal deficits.   Lab Results  Component Value Date   CREATININE 0.82 12/13/2020   BUN 13 12/13/2020   NA 141 12/13/2020   K 4.8 12/13/2020   CL 106 12/13/2020   CO2 22 12/13/2020   Lab Results  Component Value Date   ALT 17 12/13/2020   AST 19 12/13/2020   ALKPHOS 118 12/13/2020   BILITOT 0.3 12/13/2020   Lab Results  Component Value Date   HGBA1C 5.8 (H) 12/13/2020   HGBA1C 5.6 12/05/2017   Lab Results  Component Value Date   INSULIN 13.1 12/13/2020   Lab Results  Component Value Date   TSH 1.210 12/13/2020   Lab Results  Component Value Date   CHOL 181 12/13/2020   HDL 35 (L) 12/13/2020   LDLCALC 122 (H) 12/13/2020   TRIG 134 12/13/2020   CHOLHDL 5 07/11/2020   Lab Results  Component Value Date   VD25OH 55.7 12/13/2020  VD25OH 53.78 07/11/2020   VD25OH 47.88 07/15/2019   Lab Results  Component Value Date   WBC 6.4 12/13/2020   HGB 13.3 12/13/2020   HCT 40.6 12/13/2020   MCV 85 12/13/2020   PLT 318.0 07/08/2020   Attestation Statements:   Reviewed by clinician on day of visit: allergies, medications, problem list, medical history, surgical history, family history, social history, and previous encounter notes.  Time spent on visit including pre-visit chart review and post-visit care and charting was 30 minutes.   I, Water quality scientist, CMA,  am acting as Location manager for Mina Marble, NP.  I have reviewed the above documentation for accuracy and completeness, and I agree with the above. -  Ortencia Askari d. Reah Justo, NP-C

## 2021-01-22 ENCOUNTER — Telehealth: Payer: Self-pay | Admitting: Neurology

## 2021-01-22 NOTE — Telephone Encounter (Signed)
I called the patient.  MRI of the cervical spine was unremarkable.  The patient indicates that she is gradually improving with her sensory symptoms associated with physical therapy.   MRI cervical 01/20/21:  IMPRESSION: This MRI of the cervical spine with and without contrast shows the following: 1.   The spinal cord appears normal. 2.   Minimal disc degenerative changes at C3-C4 through C5-C6.  There is no foraminal narrowing, spinal stenosis or nerve root compression at these levels. 3.   Normal enhancement pattern.

## 2021-01-25 ENCOUNTER — Ambulatory Visit (INDEPENDENT_AMBULATORY_CARE_PROVIDER_SITE_OTHER): Payer: BC Managed Care – PPO | Admitting: Psychology

## 2021-01-25 ENCOUNTER — Other Ambulatory Visit: Payer: Self-pay

## 2021-01-25 DIAGNOSIS — F4310 Post-traumatic stress disorder, unspecified: Secondary | ICD-10-CM | POA: Diagnosis not present

## 2021-01-31 ENCOUNTER — Encounter (INDEPENDENT_AMBULATORY_CARE_PROVIDER_SITE_OTHER): Payer: Self-pay

## 2021-02-02 ENCOUNTER — Ambulatory Visit (INDEPENDENT_AMBULATORY_CARE_PROVIDER_SITE_OTHER): Payer: BC Managed Care – PPO | Admitting: Family Medicine

## 2021-02-02 ENCOUNTER — Encounter (INDEPENDENT_AMBULATORY_CARE_PROVIDER_SITE_OTHER): Payer: Self-pay | Admitting: Family Medicine

## 2021-02-02 ENCOUNTER — Other Ambulatory Visit: Payer: Self-pay

## 2021-02-02 VITALS — BP 127/83 | HR 92 | Temp 98.0°F | Ht 64.0 in | Wt 201.0 lb

## 2021-02-02 DIAGNOSIS — E538 Deficiency of other specified B group vitamins: Secondary | ICD-10-CM | POA: Diagnosis not present

## 2021-02-02 DIAGNOSIS — E559 Vitamin D deficiency, unspecified: Secondary | ICD-10-CM

## 2021-02-02 DIAGNOSIS — Z6835 Body mass index (BMI) 35.0-35.9, adult: Secondary | ICD-10-CM

## 2021-02-02 DIAGNOSIS — I10 Essential (primary) hypertension: Secondary | ICD-10-CM

## 2021-02-03 NOTE — Progress Notes (Signed)
Chief Complaint:   OBESITY Abigail Singh is here to discuss her progress with her obesity treatment plan along with follow-up of her obesity related diagnoses. Abigail Singh is on the Category 2 Plan and keeping a food journal and adhering to recommended goals of 300-400 calories and 30 grams protein at lunch and 400-500 calories and 35+ grams at dinner and states she is following her eating plan approximately 80% of the time. Abigail Singh states she is walking (1 flight of stairs) 10-20 minutes 5-6 times per week.  Today's visit was #: 4 Starting weight: 209 lbs Starting date: 12/13/2020 Today's weight: 201 lbs Today's date: 02/02/2021 Total lbs lost to date: 8 Total lbs lost since last in-office visit: 6   Interim History:  Abigail Singh had an anniversary dinner last week. She chose healthier items and is so happy she lost 6 lbs.   She lost 2-5 lbs before starting the program. Pt is feeling physically better. She denies hunger and her snacking has significantly decreased. She is very selective and keeping it to 200 calories. She has been meal prepping more.   Subjective:   1. Essential hypertension At goal. Abigail Singh recently increased her BP meds, per cardiology. She is tolerating it well with no side effects or concerns.  2. B12 deficiency At goal at 614 on month ago. Pt takes B12 supplement QD as prescribed with no issues. Her energy is improving on current meal plan.  3. Vitamin D deficiency At goal at 55.7 one month ago. She is currently taking OTC vitamin D 2,000 IU each day. She denies nausea, vomiting or muscle weakness.  Lab Results  Component Value Date   VD25OH 55.7 12/13/2020   VD25OH 53.78 07/11/2020   VD25OH 47.88 07/15/2019   Assessment/Plan:    1. Essential hypertension Abigail Singh is working on healthy weight loss and exercise to improve blood pressure control. We will watch for signs of hypotension as she continues her lifestyle modifications.  2. B12 deficiency The  diagnosis was reviewed with the patient. Counseling provided today, see below. We will continue to monitor. Orders and follow up as documented in patient record. Take 500 mcg cyanocobalamin QD.   Counseling The body needs vitamin B12: to make red blood cells; to make DNA; and to help the nerves work properly so they can carry messages from the brain to the body.  The main causes of vitamin B12 deficiency include dietary deficiency, digestive diseases, pernicious anemia, and having a surgery in which part of the stomach or small intestine is removed.  Certain medicines can make it harder for the body to absorb vitamin B12. These medicines include: heartburn medications; some antibiotics; some medications used to treat diabetes, gout, and high cholesterol.  In some cases, there are no symptoms of this condition. If the condition leads to anemia or nerve damage, various symptoms can occur, such as weakness or fatigue, shortness of breath, and numbness or tingling in your hands and feet.   Treatment:  May include taking vitamin B12 supplements.  Avoid alcohol.  Eat lots of healthy foods that contain vitamin B12: Beef, pork, chicken, [place], and organ meats, such as liver.  Seafood: This includes clams, rainbow trout, salmon, tuna, and haddock. Eggs.  Cereal and dairy products that are fortified: This means that vitamin B12 has been added to the food.   3. Vitamin D deficiency Low Vitamin D level contributes to fatigue and are associated with obesity, breast, and colon cancer. She agrees to continue to take OTC Vitamin  D 2,000 IU QD and will follow-up for routine testing of Vitamin D, at least 2-3 times per year to avoid over-replacement.  4. Obesity with current BMI of 34.5  Abigail Singh is currently in the action stage of change. As such, her goal is to continue with weight loss efforts. She has agreed to the Category 2 Plan and keeping a food journal and adhering to recommended goals of 300-400  calories and 30+ grams protein at lunch and 400-500 calories and 35+ grams of protein at dinner.   Exercise goals: For substantial health benefits, adults should do at least 150 minutes (2 hours and 30 minutes) a week of moderate-intensity, or 75 minutes (1 hour and 15 minutes) a week of vigorous-intensity aerobic physical activity, or an equivalent combination of moderate- and vigorous-intensity aerobic activity. Aerobic activity should be performed in episodes of at least 10 minutes, and preferably, it should be spread throughout the week.  Behavioral modification strategies: celebration eating strategies.  Abigail Singh has agreed to follow-up with our clinic in 2-3 weeks. She was informed of the importance of frequent follow-up visits to maximize her success with intensive lifestyle modifications for her multiple health conditions.   Objective:   Blood pressure 127/83, pulse 92, temperature 98 F (36.7 C), height 5\' 4"  (1.626 m), weight 201 lb (91.2 kg), SpO2 99 %. Body mass index is 34.5 kg/m.  General: Cooperative, alert, well developed, in no acute distress. HEENT: Conjunctivae and lids unremarkable. Cardiovascular: Regular rhythm.  Lungs: Normal work of breathing. Neurologic: No focal deficits.   Lab Results  Component Value Date   CREATININE 0.82 12/13/2020   BUN 13 12/13/2020   NA 141 12/13/2020   K 4.8 12/13/2020   CL 106 12/13/2020   CO2 22 12/13/2020   Lab Results  Component Value Date   ALT 17 12/13/2020   AST 19 12/13/2020   ALKPHOS 118 12/13/2020   BILITOT 0.3 12/13/2020   Lab Results  Component Value Date   HGBA1C 5.8 (H) 12/13/2020   HGBA1C 5.6 12/05/2017   Lab Results  Component Value Date   INSULIN 13.1 12/13/2020   Lab Results  Component Value Date   TSH 1.210 12/13/2020   Lab Results  Component Value Date   CHOL 181 12/13/2020   HDL 35 (L) 12/13/2020   LDLCALC 122 (H) 12/13/2020   TRIG 134 12/13/2020   CHOLHDL 5 07/11/2020   Lab Results   Component Value Date   VD25OH 55.7 12/13/2020   VD25OH 53.78 07/11/2020   VD25OH 47.88 07/15/2019   Lab Results  Component Value Date   WBC 6.4 12/13/2020   HGB 13.3 12/13/2020   HCT 40.6 12/13/2020   MCV 85 12/13/2020   PLT 318.0 07/08/2020    Attestation Statements:   Reviewed by clinician on day of visit: allergies, medications, problem list, medical history, surgical history, family history, social history, and previous encounter notes. Patient was in the office today for 30 minutes. Over 50% of the time I spent with patient in face to face counseling of various medical conditions, treatment plans of those medical conditions including medicine management and lifestyle modification, strategies to improve health and well being; and in coordination of care.  SEE TREATMENT PLAN ABOVE FOR DETAILS.   Coral Ceo, CMA, am acting as transcriptionist for [employer], DO.  I have reviewed the above documentation for accuracy and completeness, and I agree with the above. Marjory Sneddon, D.O.  The 21st Century Cures Act was signed into  law in 2016 which includes the topic of electronic health records.  This provides immediate access to information in MyChart.  This includes consultation notes, operative notes, office notes, lab results and pathology reports.  If you have any questions about what you read please let us know at your next visit so we can discuss your concerns and take corrective action if need be.  We are right here with you.

## 2021-02-08 ENCOUNTER — Other Ambulatory Visit: Payer: Self-pay

## 2021-02-08 ENCOUNTER — Ambulatory Visit (INDEPENDENT_AMBULATORY_CARE_PROVIDER_SITE_OTHER): Payer: BC Managed Care – PPO | Admitting: Psychology

## 2021-02-08 DIAGNOSIS — F4310 Post-traumatic stress disorder, unspecified: Secondary | ICD-10-CM

## 2021-02-14 ENCOUNTER — Other Ambulatory Visit: Payer: Self-pay

## 2021-02-14 ENCOUNTER — Encounter: Payer: Self-pay | Admitting: Family Medicine

## 2021-02-14 DIAGNOSIS — F900 Attention-deficit hyperactivity disorder, predominantly inattentive type: Secondary | ICD-10-CM

## 2021-02-14 MED ORDER — AMPHETAMINE-DEXTROAMPHET ER 20 MG PO CP24: 20.0000 mg | ORAL_CAPSULE | ORAL | 0 refills | Status: DC

## 2021-02-14 NOTE — Telephone Encounter (Signed)
Requesting:Adderall 20mg  Contract: UDS: Last Visit:09/29/20 Next Visit:08/02/21 Last Refill:01/13/21 30 tabs 0 refills  Please Advise

## 2021-02-20 ENCOUNTER — Encounter (INDEPENDENT_AMBULATORY_CARE_PROVIDER_SITE_OTHER): Payer: Self-pay | Admitting: Adult Health

## 2021-02-20 ENCOUNTER — Other Ambulatory Visit: Payer: Self-pay

## 2021-02-20 ENCOUNTER — Ambulatory Visit (INDEPENDENT_AMBULATORY_CARE_PROVIDER_SITE_OTHER): Payer: BC Managed Care – PPO | Admitting: Adult Health

## 2021-02-20 VITALS — BP 103/72 | HR 87 | Temp 98.4°F | Ht 64.0 in | Wt 198.0 lb

## 2021-02-20 DIAGNOSIS — I10 Essential (primary) hypertension: Secondary | ICD-10-CM

## 2021-02-20 DIAGNOSIS — R471 Dysarthria and anarthria: Secondary | ICD-10-CM | POA: Diagnosis not present

## 2021-02-20 DIAGNOSIS — E66812 Obesity, class 2: Secondary | ICD-10-CM

## 2021-02-20 DIAGNOSIS — Z6835 Body mass index (BMI) 35.0-35.9, adult: Secondary | ICD-10-CM | POA: Diagnosis not present

## 2021-02-20 DIAGNOSIS — R7303 Prediabetes: Secondary | ICD-10-CM

## 2021-02-21 DIAGNOSIS — R471 Dysarthria and anarthria: Secondary | ICD-10-CM | POA: Insufficient documentation

## 2021-02-21 DIAGNOSIS — Z6835 Body mass index (BMI) 35.0-35.9, adult: Secondary | ICD-10-CM | POA: Insufficient documentation

## 2021-02-21 DIAGNOSIS — I10 Essential (primary) hypertension: Secondary | ICD-10-CM | POA: Insufficient documentation

## 2021-02-21 NOTE — Progress Notes (Signed)
Chief Complaint:   OBESITY Abigail Singh is here to discuss her progress with her obesity treatment plan along with follow-up of her obesity related diagnoses. Abigail Singh is on the Category 2 Plan and keeping a food journal and adhering to recommended goals of 300-400 calories and 30+ grams protein with lunch and 400-500 calories and 45+ grams protein with dinner and states she is following her eating plan approximately 85% of the time. Abigail Singh states she is walking 10-12 minutes 7 times per week.  Today's visit was #: 5 Starting weight: 209 lbs Starting date: 12/13/2020 Today's weight: 198 lbs Today's date: 02/20/2021 Total lbs lost to date: 11 Total lbs lost since last in-office visit: 3  Interim History:  Per pt's physical therapist- wants to keep HR <=120 bpm with exercise and <=100 bpm at rest.  [name] reports palpitations daily. She tracks BP/HR via smart watch. She is followed by cardiology, next OV Nov 2022.  Of Note: March 2021 and January 2022- COVID-19 infections- weight prior to March 2021 was 150-160 lbs. Per pt- weight gain and tachycardia have lingered since her COVID-19 infections.  Subjective:   1. Essential hypertension BP/HR at goal at today's OV. [name] was previously on Lasix every other day- Her last dose of Lasix was over 6 weeks ago.  She is on Toprol XL 50 mg QAM and Toprol XL 25 mg QHS- managed by cardiologist, Dr. Harriet Masson.  2. Dysarthria Worsened with on topiramate. Pt has been off topiramate for years.  She feels that dysarthria has not improved or worsened in the last 6 months.  Assessment/Plan:   1. Essential hypertension Abigail Singh is working on healthy weight loss and exercise to improve blood pressure control. We will watch for signs of hypotension as she continues her lifestyle modifications. Continue to monitor ambulatory BP and remain well hydrated.   2. Dysarthria Continue with Neurology as directed.  3. Obesity with current BMI of  34.0  Abigail Singh is currently in the action stage of change. As such, her goal is to continue with weight loss efforts. She has agreed to the Category 2 Plan.   Exercise goals:  As is- YouTube exercises for PT 2 times a week.  Behavioral modification strategies: increasing lean protein intake, decreasing simple carbohydrates, meal planning and cooking strategies, keeping healthy foods in the home, and planning for success.  Abigail Singh has agreed to follow-up with our clinic in 2 weeks. She was informed of the importance of frequent follow-up visits to maximize her success with intensive lifestyle modifications for her multiple health conditions.   Objective:   Blood pressure 103/72, pulse 87, temperature 98.4 F (36.9 C), height 5\' 4"  (1.626 m), weight 198 lb (89.8 kg), SpO2 99 %. Body mass index is 33.99 kg/m.  General: Cooperative, alert, well developed, in no acute distress. HEENT: Conjunctivae and lids unremarkable. Cardiovascular: Regular rhythm.  Lungs: Normal work of breathing. Neurologic: No focal deficits.   Lab Results  Component Value Date   CREATININE 0.82 12/13/2020   BUN 13 12/13/2020   NA 141 12/13/2020   K 4.8 12/13/2020   CL 106 12/13/2020   CO2 22 12/13/2020   Lab Results  Component Value Date   ALT 17 12/13/2020   AST 19 12/13/2020   ALKPHOS 118 12/13/2020   BILITOT 0.3 12/13/2020   Lab Results  Component Value Date   HGBA1C 5.8 (H) 12/13/2020   HGBA1C 5.6 12/05/2017   Lab Results  Component Value Date   INSULIN 13.1 12/13/2020  Lab Results  Component Value Date   TSH 1.210 12/13/2020   Lab Results  Component Value Date   CHOL 181 12/13/2020   HDL 35 (L) 12/13/2020   LDLCALC 122 (H) 12/13/2020   TRIG 134 12/13/2020   CHOLHDL 5 07/11/2020   Lab Results  Component Value Date   VD25OH 55.7 12/13/2020   VD25OH 53.78 07/11/2020   VD25OH 47.88 07/15/2019   Lab Results  Component Value Date   WBC 6.4 12/13/2020   HGB 13.3 12/13/2020   HCT  40.6 12/13/2020   MCV 85 12/13/2020   PLT 318.0 07/08/2020    Attestation Statements:   Reviewed by clinician on day of visit: allergies, medications, problem list, medical history, surgical history, family history, social history, and previous encounter notes.  Time spent on visit including pre-visit chart review and post-visit care and charting was 30 minutes.   Coral Ceo, CMA, am acting as transcriptionist for Mina Marble, NP.  I have reviewed the above documentation for accuracy and completeness, and I agree with the above. -  Maryam Feely d. Ronny Ruddell, NP-C

## 2021-02-22 ENCOUNTER — Encounter: Payer: Self-pay | Admitting: Family Medicine

## 2021-02-22 ENCOUNTER — Ambulatory Visit: Payer: BC Managed Care – PPO | Admitting: Psychology

## 2021-02-23 ENCOUNTER — Telehealth (INDEPENDENT_AMBULATORY_CARE_PROVIDER_SITE_OTHER): Payer: BC Managed Care – PPO | Admitting: Registered Nurse

## 2021-02-23 ENCOUNTER — Other Ambulatory Visit: Payer: Self-pay

## 2021-02-23 ENCOUNTER — Encounter: Payer: Self-pay | Admitting: Registered Nurse

## 2021-02-23 VITALS — HR 138 | Resp 18 | Ht 64.0 in | Wt 198.0 lb

## 2021-02-23 DIAGNOSIS — J069 Acute upper respiratory infection, unspecified: Secondary | ICD-10-CM

## 2021-02-23 MED ORDER — PROMETHAZINE-DM 6.25-15 MG/5ML PO SYRP: 5.0000 mL | ORAL_SOLUTION | Freq: Four times a day (QID) | ORAL | 0 refills | Status: DC | PRN

## 2021-02-23 MED ORDER — DOXYCYCLINE HYCLATE 100 MG PO TABS: 100.0000 mg | ORAL_TABLET | Freq: Two times a day (BID) | ORAL | 0 refills | Status: DC

## 2021-02-23 MED ORDER — PREDNISONE 10 MG PO TABS: ORAL_TABLET | ORAL | 0 refills | Status: AC

## 2021-02-23 NOTE — Progress Notes (Signed)
Telemedicine Encounter- SOAP NOTE Established Patient  This telephone encounter was conducted with the patient's (or proxy's) verbal consent via audio telecommunications: yes/no: Yes Patient was instructed to have this encounter in a suitably private space; and to only have persons present to whom they give permission to participate. In addition, patient identity was confirmed by use of name plus two identifiers (DOB and address).  I discussed the limitations, risks, security and privacy concerns of performing an evaluation and management service by telephone and the availability of in person appointments. I also discussed with the patient that there may be a patient responsible charge related to this service. The patient expressed understanding and agreed to proceed.  I spent a total of  talking with the patient or their proxy.  Patient at home Provider in office  Participants: Abigail Ruddy, NP and Abigail Singh  Chief Complaint  Patient presents with   Cough    Patient states she is still having some flu like symptoms. Patient has some cough and congestion for a few days and has took some otc medications with little to no relief.    Subjective   Abigail Singh is a 45 y.o. established patient. 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  Has symbicort and albuterol inhalers at home. Has not been using.  O2 sat has dropped to 92% - has been fairly standard for her since COVID longhauler symptoms.  Does note worsening of POTS symptoms - feels orthostatic symptoms of bp drop and increased HR at times. No LOC.  Patient Active Problem List   Diagnosis Date Noted   Essential hypertension 02/21/2021   Dysarthria 02/21/2021   Class 2 severe obesity with serious comorbidity and body mass index (BMI) of 35.0 to 35.9 in adult [facility])  02/21/2021   Prediabetes 01/17/2021   Urinary tract infection    Preterm labor    Neuromuscular disorder ([place])    Migraines    History of fibromyalgia    Heartburn in pregnancy    Fibroid    Anxiety    Anemia    Allergy    Tachycardia 09/02/2020   Autonomic instability 09/02/2020   Nocturnal hypoxemia 08/31/2020   Fatigue 08/31/2020   DOE (dyspnea on exertion) 07/08/2020   Alteration in speech 06/28/2020   Depression 06/15/2020   Chronic cough 05/31/2020   Long COVID 05/31/2020   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   Seasonal allergic conjunctivitis 06/24/2018   Other allergic rhinitis 06/24/2018   Other dysphagia 06/24/2018   Fibromyalgia 12/05/2017   Vitamin D deficiency 11/26/2016   Insomnia 11/26/2016   ADHD 11/26/2016   Obesity (BMI 30-39.9) 01/27/2016   Migraine 12/29/2014   Plantar fasciitis, bilateral 11/01/2014   Sialoadenitis 10/21/2013   Status post cesarean delivery (repeat x 2) 03/17/2012   Postpartum care following cesarean delivery 03/17/2012   Physical exam 10/23/2010   FIBROMYALGIA 11/09/2009   Thyroid nodule 10/21/2009    Past Medical History:  Diagnosis Date   ADD (attention deficit disorder)    Allergy    sseasonal   Anemia    as teenager   Anxiety    Autonomic instability    Back pain    Chest pain    Chewing difficulty    Chronic fatigue    COVID-19 long hauler  Depression    Diastolic dysfunction 2703   Edema of both lower extremities    Fibroid    Fibromyalgia    Food allergy    GERD (gastroesophageal reflux disease)    Heartburn in pregnancy    History of fibromyalgia    Hypertension    IBS (irritable bowel syndrome)    Joint pain    Lactose intolerance    Migraines    Neuromuscular disorder ([facility])    fibromyalgia   Other hyperlipidemia    Pneumonia 05/2020   Postpartum care following cesarean delivery 03/17/2012   Preterm labor    SOB (shortness of breath)    Status post cesarean delivery  (repeat x 2) 03/17/2012   Swallowing difficulty    Urinary tract infection    Vitamin D deficiency     Current Outpatient Medications  Medication Sig Dispense Refill   acetaminophen (TYLENOL) 325 MG tablet Take 650 mg by mouth every 6 (six) hours as needed for moderate pain.     albuterol (VENTOLIN HFA) 108 (90 Base) MCG/ACT inhaler Inhale 1 puff into the lungs as needed for wheezing or shortness of breath.     amphetamine-dextroamphetamine (ADDERALL XR) 20 MG 24 hr capsule Take 1 capsule (20 mg total) by mouth every morning. 30 capsule 0   Ascorbic Acid (VITAMIN C) 1000 MG tablet Take 1,000 mg by mouth daily.     BIOTIN PO Take 200 mg by mouth daily.     Budesonide-Formoterol Fumarate (SYMBICORT IN) Inhale 1 puff into the lungs as needed (shortness of breath).     Calcium Carbonate (CALCIUM 500 PO) Take 500 mg by mouth daily.     chlorpheniramine (CHLOR-TRIMETON) 4 MG tablet Take 4 mg by mouth every 4 (four) hours as needed. 2AT BEDTIME     Cholecalciferol (VITAMIN D) 50 MCG (2000 UT) CAPS Take 2,000 Units by mouth daily.     doxycycline (VIBRA-TABS) 100 MG tablet Take 1 tablet (100 mg total) by mouth 2 (two) times daily. 20 tablet 0   EPINEPHrine 0.3 mg/0.3 mL IJ SOAJ injection Inject 0.3 mLs (0.3 mg total) into the muscle as needed for anaphylaxis. 1 Device 1   Erenumab-aooe (AIMOVIG) 140 MG/ML SOAJ Inject 140 mg into the skin every 30 (thirty) days. 1.12 mL 4   famotidine (PEPCID) 20 MG tablet Take 20 mg by mouth 2 (two) times daily.     ibuprofen (ADVIL) 200 MG tablet Take 400 mg by mouth as needed for moderate pain.     medroxyPROGESTERone (DEPO-PROVERA) 150 MG/ML injection SMARTSIG:1 Milliliter(s) IM Every 12 Weeks     metoprolol succinate (TOPROL XL) 25 MG 24 hr tablet Take 1 tablet (25 mg total) by mouth at bedtime. 90 tablet 3   MULTIPLE VITAMIN PO Take 1 capsule by mouth daily.     pantoprazole (PROTONIX) 40 MG tablet TAKE 1 TABLET DAILY 30-60 MIN BEFORE 1ST MEAL OF THE DAY 90  tablet 1   predniSONE (DELTASONE) 10 MG tablet Take 3 tablets (30 mg total) by mouth daily with breakfast for 3 days, THEN 2 tablets (20 mg total) daily with breakfast for 3 days, THEN 1 tablet (10 mg total) daily with breakfast for 3 days. 18 tablet 0   Probiotic Product (PROBIOTIC-10 PO) Take by mouth.     promethazine-dextromethorphan (PROMETHAZINE-DM) 6.25-15 MG/5ML syrup Take 5 mLs by mouth 4 (four) times daily as needed for cough. 118 mL 0   QUERCETIN PO Take by mouth daily.     rizatriptan (MAXALT-MLT) 10  MG disintegrating tablet Take 1 tablet (10 mg total) by mouth 3 (three) times daily as needed for migraine. May repeat in 2 hours if needed 9 tablet 3   traZODone (DESYREL) 50 MG tablet TAKE 0.5-1 TABLETS BY MOUTH AT BEDTIME AS NEEDED FOR SLEEP. 90 tablet 2   triamcinolone (NASACORT) 55 MCG/ACT AERO nasal inhaler Place 2 sprays into the nose daily. 1 Inhaler 1   vitamin B-12 (CYANOCOBALAMIN) 500 MCG tablet Take 500 mcg by mouth daily.     Zinc 30 MG TABS Take 30 mg by mouth daily.     furosemide (LASIX) 20 MG tablet Take 1 tablet (20 mg total) by mouth as needed (Bilateral leg swelling). 45 tablet 3   metoprolol succinate (TOPROL-XL) 50 MG 24 hr tablet Take 1 tablet (50 mg total) by mouth daily. Take with or immediately following a meal. 90 tablet 3   potassium chloride (KLOR-CON) 10 MEQ tablet Take 1 tablet (10 mEq total) by mouth as needed (Bilateral leg swelling). 45 tablet 3   No current facility-administered medications for this visit.    Allergies  Allergen Reactions   Aspirin Anaphylaxis    Pt reports being able to take ibuprofen with no problems.   Citrus Hives and Swelling   Codeine Other (See Comments)    halucinations   Eggs Or Egg-Derived Products Other (See Comments)    Patient states ok with flu shots, etc. No anaphalaxis.  Eggs give stomach cramps with diarrhea. After diarrhea ok.-pt eats baked goods that contains eggs with no issues    Erythromycin Rash   Lactose  Intolerance (Gi) Diarrhea    Diarrhea with lactose.   Penicillins Rash   Sulfonamide Derivatives Rash    Childhood reaction, has taken recently with only stomach irritation.    Social History   Socioeconomic History   Marital status: Married    Spouse name: Not on file   Number of children: Not on file   Years of education: Not on file   Highest education level: Not on file  Occupational History   Not on file  Tobacco Use   Smoking status: Former    Packs/day: 1.00    Years: 11.00    Pack years: 11.00    Types: Cigarettes    Quit date: 05/07/1997    Years since quitting: 23.8   Smokeless tobacco: Never   Tobacco comments:    no E cigs  Vaping Use   Vaping Use: Never used  Substance and Sexual Activity   Alcohol use: No    Alcohol/week: 0.0 standard drinks   Drug use: No   Sexual activity: Yes  Other Topics Concern   Not on file  Social History Narrative   Not on file   Social Determinants of Health   Financial Resource Strain: Not on file  Food Insecurity: Not on file  Transportation Needs: Not on file  Physical Activity: Not on file  Stress: Not on file  Social Connections: Not on file  Intimate Partner Violence: Not on file    Review of Systems  Constitutional: Negative.   HENT:  Positive for congestion, sinus pain and sore throat.   Eyes: Negative.   Respiratory:  Positive for cough. Negative for hemoptysis, sputum production, shortness of breath and wheezing.   Cardiovascular: Negative.   Gastrointestinal: Negative.   Genitourinary: Negative.   Musculoskeletal: Negative.   Skin: Negative.   Neurological: Negative.   Endo/Heme/Allergies: Negative.   Psychiatric/Behavioral: Negative.    All other systems reviewed and  are negative.  Objective  Speaking in full sentences. Sounding congested.  Vitals as reported by the patient: Today's Vitals   02/23/21 0828  Pulse: (!) 138  Resp: 18  Weight: 198 lb (89.8 kg)  Height: 5\' 4"  (1.626 m)     Calynn was seen today for cough.  Diagnoses and all orders for this visit:  Acute upper respiratory infection -     doxycycline (VIBRA-TABS) 100 MG tablet; Take 1 tablet (100 mg total) by mouth 2 (two) times daily. -     promethazine-dextromethorphan (PROMETHAZINE-DM) 6.25-15 MG/5ML syrup; Take 5 mLs by mouth 4 (four) times daily as needed for cough. -     predniSONE (DELTASONE) 10 MG tablet; Take 3 tablets (30 mg total) by mouth daily with breakfast for 3 days, THEN 2 tablets (20 mg total) daily with breakfast for 3 days, THEN 1 tablet (10 mg total) daily with breakfast for 3 days.   PLAN Given 3 weeks of symptoms with recent worsening, will give abx as above. Pt has penicillin, sulfa, and macrolide allergies. Prednisone taper as above. Pt has been on prednisone quite a bit in the past year, well aware of what to expect. Reviewed risks. Supportive care with cough syrup Patient encouraged to call clinic with any questions, comments, or concerns.  I discussed the assessment and treatment plan with the patient. The patient was provided an opportunity to ask questions and all were answered. The patient agreed with the plan and demonstrated an understanding of the instructions.   The patient was advised to call back or seek an in-person evaluation if the symptoms worsen or if the condition fails to improve as anticipated.  I provided 16 minutes of non-face-to-face time during this encounter.  Maximiano Coss, NP

## 2021-02-23 NOTE — Patient Instructions (Signed)
° ° ° °  If you have lab work done today you will be contacted with your lab results within the next 2 weeks.  If you have not heard from us then please contact us. The fastest way to get your results is to register for My Chart. ° ° °IF you received an x-ray today, you will receive an invoice from [facility]. Please contact [facility] at [phone] with questions or concerns regarding your invoice.  ° °IF you received labwork today, you will receive an invoice from [facility]. Please contact [facility] at [phone] with questions or concerns regarding your invoice.  ° °Our billing staff will not be able to assist you with questions regarding bills from these companies. ° °You will be contacted with the lab results as soon as they are available. The fastest way to get your results is to activate your My Chart account. Instructions are located on the last page of this paperwork. If you have not heard from us regarding the results in 2 weeks, please contact this office. °  ° ° ° °

## 2021-03-06 ENCOUNTER — Ambulatory Visit (INDEPENDENT_AMBULATORY_CARE_PROVIDER_SITE_OTHER): Payer: BC Managed Care – PPO | Admitting: Family Medicine

## 2021-03-06 ENCOUNTER — Other Ambulatory Visit: Payer: Self-pay

## 2021-03-06 ENCOUNTER — Encounter (INDEPENDENT_AMBULATORY_CARE_PROVIDER_SITE_OTHER): Payer: Self-pay | Admitting: Family Medicine

## 2021-03-06 VITALS — BP 114/79 | HR 67 | Temp 97.9°F | Ht 64.0 in | Wt 197.0 lb

## 2021-03-06 DIAGNOSIS — Z9189 Other specified personal risk factors, not elsewhere classified: Secondary | ICD-10-CM

## 2021-03-06 DIAGNOSIS — R7303 Prediabetes: Secondary | ICD-10-CM

## 2021-03-06 DIAGNOSIS — I10 Essential (primary) hypertension: Secondary | ICD-10-CM | POA: Diagnosis not present

## 2021-03-06 DIAGNOSIS — Z6835 Body mass index (BMI) 35.0-35.9, adult: Secondary | ICD-10-CM | POA: Diagnosis not present

## 2021-03-06 DIAGNOSIS — E66812 Obesity, class 2: Secondary | ICD-10-CM

## 2021-03-06 NOTE — Progress Notes (Signed)
Chief Complaint:   OBESITY Abigail Singh is here to discuss her progress with her obesity treatment plan along with follow-up of her obesity related diagnoses. Abigail Singh is on the Category 2 Plan and states she is following her eating plan approximately 80% of the time. Jamita states she is walking for 10 minutes 5 times per week.  Today's visit was #: 6 Starting weight: 209 lbs Starting date: 12/13/2020 Today's weight: 197 lbs Today's date: 03/06/2021 Total lbs lost to date: 12 Total lbs lost since last in-office visit: 1  Interim History: [name] has had an upper respiratory for 2 weeks, and she has been on steroids. She watched her snacking and increased her water intake. She did portion control and smarter choices during that time. She is on her last antibiotic today, and finished her last steroids 3 days ago.  Subjective:   1. Pre-diabetes Abigail Singh has a diagnosis of pre-diabetes based on her elevated HgA1c and was informed this puts her at greater risk of developing diabetes. She is not on medications, and she continues to work on diet and exercise to decrease her risk of diabetes. She denies nausea or hypoglycemia.  2. Essential hypertension Abigail Singh's blood pressure is at goal. Her O2 sat ranges 92 to 98, and with exercise in the 80's but stable. She is taking Toprol XL. She notes some heart palpitations, but no shortness of breath. She is taking Lasix as needed. She sees Cardiology and Pulmonology, and her next appointment with Cardiology in December.  3. At risk for diabetes mellitus Abigail Singh is at higher than average risk for developing diabetes due to obesity.   Assessment/Plan:  No orders of the defined types were placed in this encounter.   There are no discontinued medications.   No orders of the defined types were placed in this encounter.    1. Pre-diabetes We will recheck her A1c at her next visit (check fasting insulin if she can fast). Ludene will continue  to work on weight loss, exercise, and decreasing simple carbohydrates to help decrease the risk of diabetes.   2. Essential hypertension [name] will continue her prudent nutritional plan, weight loss, and exercise as needed.  We will watch for signs of hypotension as she continues her lifestyle modifications.  3. At risk for diabetes mellitus [name] was given approximately 9 minutes of diabetes education and counseling today. We discussed intensive lifestyle modifications today with an emphasis on weight loss as well as increasing exercise and decreasing simple carbohydrates in her diet. We also reviewed medication options with an emphasis on risk versus benefit of those discussed.   Repetitive spaced learning was employed today to elicit superior memory formation and behavioral change.  4. Class 2 severe obesity with serious comorbidity and body mass index (BMI) of 35.0 to 35.9 in adult, unspecified obesity type [facility]) Abigail Singh is currently in the action stage of change. As such, her goal is to continue with weight loss efforts. She has agreed to the Category 2 Plan.   We will recheck her A1c at her next office visit.  Exercise goals: As is.  Behavioral modification strategies: increasing lean protein intake, decreasing simple carbohydrates, and avoiding temptations.  Abigail Singh has agreed to follow-up with our clinic in 2 weeks with Abigail Marble, NP as scheduled. She was informed of the importance of frequent follow-up visits to maximize her success with intensive lifestyle modifications for her multiple health conditions.   Objective:   Blood pressure 114/79, pulse 67, temperature 97.9 F (36.6  C), height 5\' 4"  (1.626 m), weight 197 lb (89.4 kg), SpO2 96 %. Body mass index is 33.81 kg/m.  General: Cooperative, alert, well developed, in no acute distress. HEENT: Conjunctivae and lids unremarkable. Cardiovascular: Regular rhythm.  Lungs: Normal work of breathing. Neurologic: No  focal deficits.   Lab Results  Component Value Date   CREATININE 0.82 12/13/2020   BUN 13 12/13/2020   NA 141 12/13/2020   K 4.8 12/13/2020   CL 106 12/13/2020   CO2 22 12/13/2020   Lab Results  Component Value Date   ALT 17 12/13/2020   AST 19 12/13/2020   ALKPHOS 118 12/13/2020   BILITOT 0.3 12/13/2020   Lab Results  Component Value Date   HGBA1C 5.8 (H) 12/13/2020   HGBA1C 5.6 12/05/2017   Lab Results  Component Value Date   INSULIN 13.1 12/13/2020   Lab Results  Component Value Date   TSH 1.210 12/13/2020   Lab Results  Component Value Date   CHOL 181 12/13/2020   HDL 35 (L) 12/13/2020   LDLCALC 122 (H) 12/13/2020   TRIG 134 12/13/2020   CHOLHDL 5 07/11/2020   Lab Results  Component Value Date   VD25OH 55.7 12/13/2020   VD25OH 53.78 07/11/2020   VD25OH 47.88 07/15/2019   Lab Results  Component Value Date   WBC 6.4 12/13/2020   HGB 13.3 12/13/2020   HCT 40.6 12/13/2020   MCV 85 12/13/2020   PLT 318.0 07/08/2020   No results found for: IRON, TIBC, FERRITIN  Attestation Statements:   Reviewed by clinician on day of visit: allergies, medications, problem list, medical history, surgical history, family history, social history, and previous encounter notes.   Wilhemena Durie, am acting as transcriptionist for [employer], DO.  I have reviewed the above documentation for accuracy and completeness, and I agree with the above. Marjory Sneddon, D.O.  The [employer] was signed into law in 2016 which includes the topic of electronic health records.  This provides immediate access to information in MyChart.  This includes consultation notes, operative notes, office notes, lab results and pathology reports.  If you have any questions about what you read please let us know at your next visit so we can discuss your concerns and take corrective action if need be.  We are right here with you.

## 2021-03-08 ENCOUNTER — Other Ambulatory Visit: Payer: Self-pay

## 2021-03-08 ENCOUNTER — Ambulatory Visit (INDEPENDENT_AMBULATORY_CARE_PROVIDER_SITE_OTHER): Payer: BC Managed Care – PPO | Admitting: Psychology

## 2021-03-08 DIAGNOSIS — F4310 Post-traumatic stress disorder, unspecified: Secondary | ICD-10-CM | POA: Diagnosis not present

## 2021-03-10 ENCOUNTER — Encounter: Payer: Self-pay | Admitting: Family Medicine

## 2021-03-10 DIAGNOSIS — J069 Acute upper respiratory infection, unspecified: Secondary | ICD-10-CM

## 2021-03-10 MED ORDER — DOXYCYCLINE HYCLATE 100 MG PO TABS: 100.0000 mg | ORAL_TABLET | Freq: Two times a day (BID) | ORAL | 0 refills | Status: DC

## 2021-03-13 ENCOUNTER — Encounter: Payer: Self-pay | Admitting: Family Medicine

## 2021-03-17 ENCOUNTER — Encounter: Payer: Self-pay | Admitting: Family Medicine

## 2021-03-17 DIAGNOSIS — F900 Attention-deficit hyperactivity disorder, predominantly inattentive type: Secondary | ICD-10-CM

## 2021-03-17 MED ORDER — AMPHETAMINE-DEXTROAMPHET ER 20 MG PO CP24: 20.0000 mg | ORAL_CAPSULE | ORAL | 0 refills | Status: DC

## 2021-03-17 MED ORDER — FLUCONAZOLE 150 MG PO TABS: 150.0000 mg | ORAL_TABLET | Freq: Once | ORAL | 0 refills | Status: AC

## 2021-03-20 ENCOUNTER — Ambulatory Visit (INDEPENDENT_AMBULATORY_CARE_PROVIDER_SITE_OTHER): Payer: BC Managed Care – PPO | Admitting: Adult Health

## 2021-03-22 ENCOUNTER — Other Ambulatory Visit: Payer: Self-pay

## 2021-03-22 ENCOUNTER — Ambulatory Visit (INDEPENDENT_AMBULATORY_CARE_PROVIDER_SITE_OTHER): Payer: BC Managed Care – PPO | Admitting: Psychology

## 2021-03-22 DIAGNOSIS — F4310 Post-traumatic stress disorder, unspecified: Secondary | ICD-10-CM

## 2021-04-03 ENCOUNTER — Encounter (INDEPENDENT_AMBULATORY_CARE_PROVIDER_SITE_OTHER): Payer: Self-pay | Admitting: Family Medicine

## 2021-04-03 ENCOUNTER — Other Ambulatory Visit: Payer: Self-pay

## 2021-04-03 ENCOUNTER — Ambulatory Visit (INDEPENDENT_AMBULATORY_CARE_PROVIDER_SITE_OTHER): Payer: BC Managed Care – PPO | Admitting: Family Medicine

## 2021-04-03 VITALS — BP 110/69 | HR 69 | Temp 97.8°F | Ht 64.0 in | Wt 192.0 lb

## 2021-04-03 DIAGNOSIS — Z9189 Other specified personal risk factors, not elsewhere classified: Secondary | ICD-10-CM | POA: Diagnosis not present

## 2021-04-03 DIAGNOSIS — Z6835 Body mass index (BMI) 35.0-35.9, adult: Secondary | ICD-10-CM

## 2021-04-03 DIAGNOSIS — R7303 Prediabetes: Secondary | ICD-10-CM

## 2021-04-03 DIAGNOSIS — E7849 Other hyperlipidemia: Secondary | ICD-10-CM | POA: Diagnosis not present

## 2021-04-03 DIAGNOSIS — I10 Essential (primary) hypertension: Secondary | ICD-10-CM

## 2021-04-03 NOTE — Progress Notes (Signed)
Chief Complaint:   OBESITY Abigail Singh is here to discuss her progress with her obesity treatment plan along with follow-up of her obesity related diagnoses. Abigail Singh is on the Category 2 Plan and states she is following her eating plan approximately 75% of the time. Abigail Singh states she is walking for 12 minutes 7 times per week.  Today's visit was #: 7 Starting weight: 209 lbs Starting date: 12/13/2020 Today's weight: 192 lbs Today's date: 04/03/2021 Total lbs lost to date: 17 Total lbs lost since last in-office visit: 5  Interim History: [place] traveled over Thanksgiving, and she ate out a lot but she made smarter choices and tried to portion control. No desserts and she restrained from over-indulging. Her husband was recently diagnosed with renal carcinoma and he is having it removed on 04/18/2021.  Subjective:   1. Pre-diabetes Abigail Singh has a diagnosis of pre-diabetes based on her elevated HgA1c and was informed this puts her at greater risk of developing diabetes. She continues to work on diet and exercise to decrease her risk of diabetes. She denies nausea or hypoglycemia.  2. Other hyperlipidemia Abigail Singh has hyperlipidemia and has been trying to improve her cholesterol levels with intensive lifestyle modification including a low saturated fat diet, exercise and weight loss. She denies any chest pain, claudication or myalgias.  3. Essential hypertension Abigail Singh's blood pressure is at goal. She denies concerns or issues, and no recent changes to medications. Last CMP was within normal limits.   BP Readings from Last 3 Encounters:  04/03/21 110/69  03/06/21 114/79  02/20/21 103/72   4. At risk for heart disease Abigail Singh is at a higher than average risk for cardiovascular disease due to hyperlipidemia, hypertension, pre-diabetes, etc.  Assessment/Plan:   Orders Placed This Encounter  Procedures   Hemoglobin A1c   Insulin, random   Lipid panel    There are no  discontinued medications.   No orders of the defined types were placed in this encounter.    1. Pre-diabetes Abigail Singh will continue to work on weight loss, exercise, and decreasing simple carbohydrates to help decrease the risk of diabetes. We will check labs today.  - Hemoglobin A1c - Insulin, random  2. Other hyperlipidemia Cardiovascular risk and specific lipid/LDL goals reviewed. We discussed several lifestyle modifications today. We will check labs today. Abigail Singh will continue her prudent nutritional plan, exercise and weight loss efforts. Orders and follow up as documented in patient record.   Counseling Intensive lifestyle modifications are the first line treatment for this issue. Dietary changes: Increase soluble fiber. Decrease simple carbohydrates. Exercise changes: Moderate to vigorous-intensity aerobic activity 150 minutes per week if tolerated. Lipid-lowering medications: see documented in medical record.  - Lipid panel  3. Essential hypertension Abigail Singh is to make an appointment with Cardiology this December, as she is due for a follow up. She will continue with her prudent nutritional plan and exercise. We will watch for signs of hypotension as she continues her lifestyle modifications.  4. At risk for heart disease Due to Abigail Singh's current state of health and medical condition(s), she is at a higher risk for heart disease.  This puts the patient at much greater risk to subsequently develop cardiopulmonary conditions that can significantly affect patient's quality of life in a negative manner.    At least 9 minutes were spent on counseling Abigail Singh about these concerns today, and I stressed the importance of reversing risks factors of obesity, especially truncal and visceral fat, hypertension, hyperlipidemia, and pre-diabetes. The initial  goal is to lose at least 5-10% of starting weight to help reduce these risk factors.  Counseling:  Intensive lifestyle modifications  were discussed with Abigail Singh as the most appropriate first line of treatment.  she will continue to work on diet, exercise, and weight loss efforts.  We will continue to reassess these conditions on a fairly regular basis in an attempt to decrease the patient's overall morbidity and mortality.  Evidence-based interventions for health behavior change were utilized today including the discussion of self monitoring techniques, problem-solving barriers, and SMART goal setting techniques.  Specifically, regarding patient's less desirable eating habits and patterns, we employed the technique of small changes when Abigail Singh has not been able to fully commit to her prudent nutritional plan.  5. Obesity with current BMI of 33.1 Abigail Singh is currently in the action stage of change. As such, her goal is to continue with weight loss efforts. She has agreed to the Category 2 Plan.   Exercise goals: As is, increase as tolerated to help with upcoming stressors.  Behavioral modification strategies: meal planning and cooking strategies and planning for success.  Abigail Singh has agreed to follow-up with our clinic in 3 weeks. She was informed of the importance of frequent follow-up visits to maximize her success with intensive lifestyle modifications for her multiple health conditions.   Abigail Singh was informed we would discuss her lab results at her next visit unless there is a critical issue that needs to be addressed sooner. Abigail Singh agreed to keep her next visit at the agreed upon time to discuss these results.  Objective:   Blood pressure 110/69, pulse 69, temperature 97.8 F (36.6 C), height 5\' 4"  (1.626 m), weight 192 lb (87.1 kg), SpO2 95 %. Body mass index is 32.96 kg/m.  General: Cooperative, alert, well developed, in no acute distress. HEENT: Conjunctivae and lids unremarkable. Cardiovascular: Regular rhythm.  Lungs: Normal work of breathing. Neurologic: No focal deficits.   Lab Results  Component  Value Date   CREATININE 0.82 12/13/2020   BUN 13 12/13/2020   NA 141 12/13/2020   K 4.8 12/13/2020   CL 106 12/13/2020   CO2 22 12/13/2020   Lab Results  Component Value Date   ALT 17 12/13/2020   AST 19 12/13/2020   ALKPHOS 118 12/13/2020   BILITOT 0.3 12/13/2020   Lab Results  Component Value Date   HGBA1C 5.8 (H) 12/13/2020   HGBA1C 5.6 12/05/2017   Lab Results  Component Value Date   INSULIN 13.1 12/13/2020   Lab Results  Component Value Date   TSH 1.210 12/13/2020   Lab Results  Component Value Date   CHOL 181 12/13/2020   HDL 35 (L) 12/13/2020   LDLCALC 122 (H) 12/13/2020   TRIG 134 12/13/2020   CHOLHDL 5 07/11/2020   Lab Results  Component Value Date   VD25OH 55.7 12/13/2020   VD25OH 53.78 07/11/2020   VD25OH 47.88 07/15/2019   Lab Results  Component Value Date   WBC 6.4 12/13/2020   HGB 13.3 12/13/2020   HCT 40.6 12/13/2020   MCV 85 12/13/2020   PLT 318.0 07/08/2020   No results found for: IRON, TIBC, FERRITIN  Attestation Statements:   Reviewed by clinician on day of visit: allergies, medications, problem list, medical history, surgical history, family history, social history, and previous encounter notes.   Wilhemena Durie, am acting as transcriptionist for [employer], DO.  I have reviewed the above documentation for accuracy and completeness, and I agree with the above. -  Marjory Sneddon, D.O.  The [place] was signed into law in 2016 which includes the topic of electronic health records.  This provides immediate access to information in MyChart.  This includes consultation notes, operative notes, office notes, lab results and pathology reports.  If you have any questions about what you read please let us know at your next visit so we can discuss your concerns and take corrective action if need be.  We are right here with you.

## 2021-04-04 LAB — HEMOGLOBIN A1C
Est. average glucose Bld gHb Est-mCnc: 114 mg/dL
Hgb A1c MFr Bld: 5.6 % (ref 4.8–5.6)

## 2021-04-04 LAB — LIPID PANEL
Chol/HDL Ratio: 5 ratio — ABNORMAL HIGH (ref 0.0–4.4)
Cholesterol, Total: 171 mg/dL (ref 100–199)
HDL: 34 mg/dL — ABNORMAL LOW (ref >39)
LDL Chol Calc (NIH): 115 mg/dL — ABNORMAL HIGH (ref 0–99)
Triglycerides: 118 mg/dL (ref 0–149)
VLDL Cholesterol Cal: 22 mg/dL (ref 5–40)

## 2021-04-04 LAB — INSULIN, RANDOM: INSULIN: 15.2 u[IU]/mL (ref 2.6–24.9)

## 2021-04-05 ENCOUNTER — Other Ambulatory Visit: Payer: Self-pay

## 2021-04-05 ENCOUNTER — Ambulatory Visit (INDEPENDENT_AMBULATORY_CARE_PROVIDER_SITE_OTHER): Payer: BC Managed Care – PPO | Admitting: Psychology

## 2021-04-05 DIAGNOSIS — F4310 Post-traumatic stress disorder, unspecified: Secondary | ICD-10-CM

## 2021-04-11 ENCOUNTER — Other Ambulatory Visit: Payer: Self-pay | Admitting: Family Medicine

## 2021-04-19 ENCOUNTER — Ambulatory Visit: Payer: BC Managed Care – PPO | Admitting: Psychology

## 2021-04-19 ENCOUNTER — Other Ambulatory Visit: Payer: Self-pay | Admitting: Family Medicine

## 2021-04-19 DIAGNOSIS — F900 Attention-deficit hyperactivity disorder, predominantly inattentive type: Secondary | ICD-10-CM

## 2021-04-19 MED ORDER — AMPHETAMINE-DEXTROAMPHET ER 20 MG PO CP24: 20.0000 mg | ORAL_CAPSULE | ORAL | 0 refills | Status: DC

## 2021-04-19 NOTE — Telephone Encounter (Signed)
Patient is requesting a refill of the following medications: Requested Prescriptions   Pending Prescriptions Disp Refills   amphetamine-dextroamphetamine (ADDERALL XR) 20 MG 24 hr capsule 30 capsule 0    Sig: Take 1 capsule (20 mg total) by mouth every morning.    Date of patient request: 04/19/2021 Last office visit: 04/03/2021 Date of last refill: 03/17/2021 Last refill amount: 30 capsules Follow up time period per chart: 07/13/2021

## 2021-04-24 ENCOUNTER — Other Ambulatory Visit: Payer: Self-pay

## 2021-04-24 ENCOUNTER — Ambulatory Visit (INDEPENDENT_AMBULATORY_CARE_PROVIDER_SITE_OTHER): Payer: BC Managed Care – PPO | Admitting: Family Medicine

## 2021-04-24 ENCOUNTER — Encounter (INDEPENDENT_AMBULATORY_CARE_PROVIDER_SITE_OTHER): Payer: Self-pay | Admitting: Family Medicine

## 2021-04-24 VITALS — BP 136/80 | HR 94 | Temp 97.7°F | Ht 64.0 in | Wt 185.0 lb

## 2021-04-24 DIAGNOSIS — E7849 Other hyperlipidemia: Secondary | ICD-10-CM

## 2021-04-24 DIAGNOSIS — Z9189 Other specified personal risk factors, not elsewhere classified: Secondary | ICD-10-CM

## 2021-04-24 DIAGNOSIS — Z6835 Body mass index (BMI) 35.0-35.9, adult: Secondary | ICD-10-CM

## 2021-04-24 DIAGNOSIS — I10 Essential (primary) hypertension: Secondary | ICD-10-CM | POA: Diagnosis not present

## 2021-04-24 DIAGNOSIS — R7303 Prediabetes: Secondary | ICD-10-CM | POA: Diagnosis not present

## 2021-04-25 NOTE — Progress Notes (Signed)
Chief Complaint:   OBESITY Abigail Singh is here to discuss her progress with her obesity treatment plan along with follow-up of her obesity related diagnoses. Abigail Singh is on the Category 2 Plan and states she is following her eating plan approximately 70% of the time. Abigail Singh states she is walking for 10-15 minutes 7 times per week.  Today's visit was #: 8 Starting weight: 209 lbs Starting date: 12/13/2020 Today's weight: 185 lbs Today's date: 04/24/2021 Total lbs lost to date: 24 Total lbs lost since last in-office visit: 7  Interim History: Abigail Singh states "she is really watching her snacking". While she was at the hospital for her husbands renal surgery, she was extra vigilant with what she ate. She is doing a lot more portion control and she is eating more protein. Her food allergies and sensitivities have been great lately. She is here to review labs as well.  Subjective:   1. Pre-diabetes Abigail Singh has a diagnosis of pre-diabetes based on her elevated A1c, but it has improved and her hunger is controlled. She continues to work on diet and exercise to decrease her risk of diabetes. She denies nausea or hypoglycemia. I discussed labs with the patient today.  2. Essential hypertension Abigail Singh's blood pressure has been high with stressors, etc. She is taking Lasix and Toprol. Cardiovascular ROS: no chest pain or dyspnea on exertion. I discussed labs with the patient today.  BP Readings from Last 3 Encounters:  04/24/21 136/80  04/03/21 110/69  03/06/21 114/79   3. Other hyperlipidemia Jenniferhas hyperlipidemia and has been trying to improve her cholesterol levels with intensive lifestyle modification including a low saturated fat diet, exercise and weight loss. Her LDL and triglycerides have improved, but no change with her HDL. She denies any chest pain, claudication or myalgias. I discussed labs with the patient today.  4. At risk for malnutrition Abigail Singh is at increased risk  for malnutrition due to inadequate intake.  Assessment/Plan:  No orders of the defined types were placed in this encounter.   There are no discontinued medications.   No orders of the defined types were placed in this encounter.    1. Pre-diabetes Abigail Singh will continue to work on weight loss, exercise, and decreasing simple carbohydrates to help decrease the risk of diabetes.   2. Essential hypertension Abigail Singh's blood pressure is at goal today, and we will follow up at her next office visit.  - Counseled Abigail Singh on pathophysiology of disease and discussed treatment plan, which always includes dietary and lifestyle modification as first line.  - Lifestyle changes such as following our low salt, heart healthy meal plan and engaging in a regular exercise program discussed  - Avoid buying foods that are: processed, frozen, or prepackaged to avoid excess salt. - Ambulatory blood pressure monitoring encouraged.  Reminded patient that if they ever feel poorly in any way, to check their blood pressure and pulse as well. - We will continue to monitor closely alongside PCP/ specialists.  Pt reminded to also f/up with those individuals as instructed by them.  - We will continue to monitor symptoms as they relate to the her weight loss journey.  3. Other hyperlipidemia Cardiovascular risk and specific lipid/LDL goals reviewed. We discussed several lifestyle modifications today. Abigail Singh will continue her prudent nutritional plan and increase her activity. Orders and follow up as documented in patient record.   Counseling Intensive lifestyle modifications are the first line treatment for this issue. Dietary changes: Increase soluble fiber. Decrease simple  carbohydrates. Exercise changes: Moderate to vigorous-intensity aerobic activity 150 minutes per week if tolerated. Lipid-lowering medications: see documented in medical record.  4. At risk for malnutrition Abigail Singh was given  approximately 9 minutes of counseling today regarding prevention of malnutrition and ways to meet macronutrient goals..   5. Obesity with current BMI of 31.8 Abigail Singh is currently in the action stage of change. As such, her goal is to continue with weight loss efforts. She has agreed to the Category 2 Plan with lunch options.   Exercise goals: As is, increase as tolerated.  Behavioral modification strategies: increasing lean protein intake, decreasing simple carbohydrates, and holiday eating strategies (handout was given).  Abigail Singh has agreed to follow-up with our clinic in 2 to 3 weeks. She was informed of the importance of frequent follow-up visits to maximize her success with intensive lifestyle modifications for her multiple health conditions.   Objective:   Blood pressure 136/80, pulse 94, temperature 97.7 F (36.5 C), height 5\' 4"  (1.626 m), weight 185 lb (83.9 kg), SpO2 100 %. Body mass index is 31.76 kg/m.  General: Cooperative, alert, well developed, in no acute distress. HEENT: Conjunctivae and lids unremarkable. Cardiovascular: Regular rhythm.  Lungs: Normal work of breathing. Neurologic: No focal deficits.   Lab Results  Component Value Date   CREATININE 0.82 12/13/2020   BUN 13 12/13/2020   NA 141 12/13/2020   K 4.8 12/13/2020   CL 106 12/13/2020   CO2 22 12/13/2020   Lab Results  Component Value Date   ALT 17 12/13/2020   AST 19 12/13/2020   ALKPHOS 118 12/13/2020   BILITOT 0.3 12/13/2020   Lab Results  Component Value Date   HGBA1C 5.6 04/03/2021   HGBA1C 5.8 (H) 12/13/2020   HGBA1C 5.6 12/05/2017   Lab Results  Component Value Date   INSULIN 15.2 04/03/2021   INSULIN 13.1 12/13/2020   Lab Results  Component Value Date   TSH 1.210 12/13/2020   Lab Results  Component Value Date   CHOL 171 04/03/2021   HDL 34 (L) 04/03/2021   LDLCALC 115 (H) 04/03/2021   TRIG 118 04/03/2021   CHOLHDL 5.0 (H) 04/03/2021   Lab Results  Component Value Date    VD25OH 55.7 12/13/2020   VD25OH 53.78 07/11/2020   VD25OH 47.88 07/15/2019   Lab Results  Component Value Date   WBC 6.4 12/13/2020   HGB 13.3 12/13/2020   HCT 40.6 12/13/2020   MCV 85 12/13/2020   PLT 318.0 07/08/2020   No results found for: IRON, TIBC, FERRITIN  Attestation Statements:   Reviewed by clinician on day of visit: allergies, medications, problem list, medical history, surgical history, family history, social history, and previous encounter notes.   Wilhemena Durie, am acting as transcriptionist for [employer], DO.  I have reviewed the above documentation for accuracy and completeness, and I agree with the above. Marjory Sneddon, D.O.  The [place] was signed into law in 2016 which includes the topic of electronic health records.  This provides immediate access to information in MyChart.  This includes consultation notes, operative notes, office notes, lab results and pathology reports.  If you have any questions about what you read please let us know at your next visit so we can discuss your concerns and take corrective action if need be.  We are right here with you.

## 2021-05-03 ENCOUNTER — Ambulatory Visit: Payer: BC Managed Care – PPO | Admitting: Psychology

## 2021-05-03 NOTE — Progress Notes (Addendum)
PATIENT: Abigail Singh DOB: 05/09/1975  REASON FOR VISIT: Follow up HISTORY FROM: Patient PRIMARY NEUROLOGIST: Dr. Jannifer Franklin, now will be followed by Dr. Felecia Shelling   HISTORY OF PRESENT ILLNESS: Today 05/04/21 Abigail Singh here today for follow-up with history of migraine headaches.  Has had long-lasting post-COVID symptoms with coughing and trouble swallowing.  Saw Dr. Jannifer Franklin in August 2022 with multiple somatic complaints.  Was placed on Aimovig for the headache. MRI cervical spine was unremarkable.  Her sensory symptoms in her extremities were improving with physical therapy. Is doing much better now. Sensory symptoms have resolved. Goes to PT twice a week, still has some wrist and elbow pain. Will be year in January since Abigail Singh. Still some speech alteration every few sentences. Got ST referral but hasn't had time with all appointments. Her husband diagnosed with cancer, kidney removed. Planning to be done with PT around May. No longer issues with word recall. This week some issues with swallowing, but thinks due to lack of sleep and caffeine, prior this was much better. Headaches are better. Never started Aimovig. PT really helping with maintaining pain. Is doing walking 10-12 minutes daily, can't get more. Her HR increases. Lost 40 lbs since August. 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 She is much better.   HISTORY  01/03/2021 Dr. Jannifer Franklin: Abigail Singh is a 45 year old right-handed white female with a history of migraine headaches.  The patient past has been treated with Topamax, but this was stopped as she was having troubles with speech and language, but this has persisted stopping the Topamax.  The patient has been on beta-blocker with metoprolol, but she continues to have 2 or 3 headaches a week.  She works as a Pharmacist, hospital, she has been out of work until just recently because of long-lasting post-COVID symptoms.  She had COVID in March 2021 and again in January 2022.  She had severe  coughing issues and difficulty with swallowing.  She had increased headache frequency.  Over the last couple years, she has had elevations in baseline heart rate.  The patient has exercise intolerance as well.  She has diffuse achy pains throughout the chest and arms and legs with shooting pains that may go down the legs.  She has tingling in the legs and in the hands and numbness in the hands.  She has gait instability and occasional falls.  She has dizziness with standing.  She has been treated with gabapentin up to 1200 mg daily but this offered no benefit and she went off the medication.  She has been seen through another neurologist, Dr. Primus Bravo, who told her that she had a psychogenic illness.  MRI of the brain was done as she was having pain in the left face, and she has bilateral jaw discomfort.  MRI of the brain was unremarkable.  The patient is felt to have a history of fibromyalgia, OCD, and posttraumatic stress disorder.  She has had a positive ANA evaluation, she has a hemoglobin A1c was slightly elevated at 5.8.  She had a significant mount of weight gain after she caught COVID.  She is trying to lose weight at this point.  She comes to this office with these multiple somatic complaints.  REVIEW OF SYSTEMS: Out of a complete 14 system review of symptoms, the patient complains only of the following symptoms, and all other reviewed systems are negative.   See HPI  ALLERGIES: Allergies  Allergen Reactions   Aspirin Anaphylaxis  Pt reports being able to take ibuprofen with no problems.   Citrus Hives and Swelling   Codeine Other (See Comments)    halucinations   Eggs Or Egg-Derived Products Other (See Comments)    Patient states ok with flu shots, etc. No anaphalaxis.  Eggs give stomach cramps with diarrhea. After diarrhea ok.-pt eats baked goods that contains eggs with no issues    Erythromycin Rash   Lactose Intolerance (Gi) Diarrhea    Diarrhea with lactose.   Penicillins Rash    Sulfonamide Derivatives Rash    Childhood reaction, has taken recently with only stomach irritation.    HOME MEDICATIONS: Outpatient Medications Prior to Visit  Medication Sig Dispense Refill   acetaminophen (TYLENOL) 325 MG tablet Take 650 mg by mouth every 6 (six) hours as needed for moderate pain.     albuterol (VENTOLIN HFA) 108 (90 Base) MCG/ACT inhaler Inhale 1 puff into the lungs as needed for wheezing or shortness of breath.     amphetamine-dextroamphetamine (ADDERALL XR) 20 MG 24 hr capsule Take 1 capsule (20 mg total) by mouth every morning. 30 capsule 0   Ascorbic Acid (VITAMIN C) 1000 MG tablet Take 1,000 mg by mouth daily.     BIOTIN PO Take 200 mg by mouth daily.     Budesonide-Formoterol Fumarate (SYMBICORT IN) Inhale 1 puff into the lungs as needed (shortness of breath).     Calcium Carbonate (CALCIUM 500 PO) Take 500 mg by mouth daily.     chlorpheniramine (CHLOR-TRIMETON) 4 MG tablet Take 4 mg by mouth every 4 (four) hours as needed. 2AT BEDTIME     Cholecalciferol (VITAMIN D) 50 MCG (2000 UT) CAPS Take 2,000 Units by mouth daily.     doxycycline (VIBRA-TABS) 100 MG tablet Take 1 tablet (100 mg total) by mouth 2 (two) times daily. 20 tablet 0   EPINEPHrine 0.3 mg/0.3 mL IJ SOAJ injection Inject 0.3 mLs (0.3 mg total) into the muscle as needed for anaphylaxis. 1 Device 1   Erenumab-aooe (AIMOVIG) 140 MG/ML SOAJ Inject 140 mg into the skin every 30 (thirty) days. 1.12 mL 4   famotidine (PEPCID) 20 MG tablet Take 20 mg by mouth 2 (two) times daily.     ibuprofen (ADVIL) 200 MG tablet Take 400 mg by mouth as needed for moderate pain.     medroxyPROGESTERone (DEPO-PROVERA) 150 MG/ML injection SMARTSIG:1 Milliliter(s) IM Every 12 Weeks     metoprolol succinate (TOPROL XL) 25 MG 24 hr tablet Take 1 tablet (25 mg total) by mouth at bedtime. 90 tablet 3   MULTIPLE VITAMIN PO Take 1 capsule by mouth daily.     pantoprazole (PROTONIX) 40 MG tablet TAKE 1 TABLET DAILY 30-60 MIN BEFORE  1ST MEAL OF THE DAY 90 tablet 1   Probiotic Product (PROBIOTIC-10 PO) Take by mouth.     promethazine-dextromethorphan (PROMETHAZINE-DM) 6.25-15 MG/5ML syrup Take 5 mLs by mouth 4 (four) times daily as needed for cough. 118 mL 0   QUERCETIN PO Take by mouth daily.     rizatriptan (MAXALT-MLT) 10 MG disintegrating tablet Take 1 tablet (10 mg total) by mouth 3 (three) times daily as needed for migraine. May repeat in 2 hours if needed 9 tablet 3   traZODone (DESYREL) 50 MG tablet TAKE 1/2 TO 1 TABLET BY MOUTH AT BEDTIME AS NEEDED FOR SLEEP 90 tablet 2   triamcinolone (NASACORT) 55 MCG/ACT AERO nasal inhaler Place 2 sprays into the nose daily. 1 Inhaler 1   vitamin B-12 (CYANOCOBALAMIN) 500  MCG tablet Take 500 mcg by mouth daily.     Zinc 30 MG TABS Take 30 mg by mouth daily.     furosemide (LASIX) 20 MG tablet Take 1 tablet (20 mg total) by mouth as needed (Bilateral leg swelling). 45 tablet 3   metoprolol succinate (TOPROL-XL) 50 MG 24 hr tablet Take 1 tablet (50 mg total) by mouth daily. Take with or immediately following a meal. 90 tablet 3   potassium chloride (KLOR-CON) 10 MEQ tablet Take 1 tablet (10 mEq total) by mouth as needed (Bilateral leg swelling). 45 tablet 3   No facility-administered medications prior to visit.    PAST MEDICAL HISTORY: Past Medical History:  Diagnosis Date   ADD (attention deficit disorder)    Allergy    sseasonal   Anemia    as teenager   Anxiety    Autonomic instability    Back pain    Chest pain    Chewing difficulty    Chronic fatigue    COVID-19 long hauler    Depression    Diastolic dysfunction 2951   Edema of both lower extremities    Fibroid    Fibromyalgia    Food allergy    GERD (gastroesophageal reflux disease)    Heartburn in pregnancy    History of fibromyalgia    Hypertension    IBS (irritable bowel syndrome)    Joint pain    Lactose intolerance    Migraines    Neuromuscular disorder (HCC)    fibromyalgia   Other  hyperlipidemia    Pneumonia 05/2020   Postpartum care following cesarean delivery 03/17/2012   Preterm labor    SOB (shortness of breath)    Status post cesarean delivery (repeat x 2) 03/17/2012   Swallowing difficulty    Urinary tract infection    Vitamin D deficiency     PAST SURGICAL HISTORY: Past Surgical History:  Procedure Laterality Date   abnormal cells in colon/tatoo area     2012   CESAREAN SECTION  2009   CESAREAN SECTION  03/17/2012   Procedure: CESAREAN SECTION;  Surgeon: Marvene Staff, MD;  Location: [facility];  Service: Obstetrics;  Laterality: N/A;  Repeat C/S.  EDD: 03/23/12   COLONOSCOPY     DILATION AND CURETTAGE OF UTERUS     POLYPECTOMY     tubes in ears     WISDOM TOOTH EXTRACTION      FAMILY HISTORY: Family History  Problem Relation Age of Onset   Hyperlipidemia Mother    Diabetes Mother    High Cholesterol Mother    Depression Mother    Hyperlipidemia Father    Diabetes Father    Food Allergy Father    High Cholesterol Father    Heart attack Father    Hypertension Father    Diabetes Maternal Grandmother    Colon polyps Maternal Grandmother    Diabetes Maternal Grandfather    Colon polyps Maternal Grandfather    Heart disease Maternal Grandfather    Colon cancer Paternal Grandmother    Heart disease Paternal Grandfather    Allergic rhinitis Daughter    Eczema Daughter    Anxiety disorder Daughter    Lactose intolerance Daughter    OCD Daughter    Anxiety disorder Daughter    Lactose intolerance Daughter    Anxiety disorder Daughter    Asthma Daughter    Lactose intolerance Daughter    Diabetes Maternal Aunt    Heart failure Other  Grandfather   Heart attack Other        x's 4 Grandfather   Stroke Other        x's 1 Grandfather   Diabetes Other    Angioedema Neg Hx    Immunodeficiency Neg Hx     SOCIAL HISTORY: Social History   Socioeconomic History   Marital status: Married    Spouse name: Not on file   Number  of children: Not on file   Years of education: Not on file   Highest education level: Not on file  Occupational History   Not on file  Tobacco Use   Smoking status: Former    Packs/day: 1.00    Years: 11.00    Pack years: 11.00    Types: Cigarettes    Quit date: 05/07/1997    Years since quitting: 24.0   Smokeless tobacco: Never   Tobacco comments:    no E cigs  Vaping Use   Vaping Use: Never used  Substance and Sexual Activity   Alcohol use: No    Alcohol/week: 0.0 standard drinks   Drug use: No   Sexual activity: Yes  Other Topics Concern   Not on file  Social History Narrative   Not on file   Social Determinants of Health   Financial Resource Strain: Not on file  Food Insecurity: Not on file  Transportation Needs: Not on file  Physical Activity: Not on file  Stress: Not on file  Social Connections: Not on file  Intimate Partner Violence: Not on file   PHYSICAL EXAM  Vitals:   05/04/21 0946  BP: 108/82  Pulse: (!) 114  Weight: 185 lb (83.9 kg)  Height: 5\' 3"  (1.6 m)   Body mass index is 32.77 kg/m.  Generalized: Well developed, in no acute distress  Neurological examination  Mentation: Alert oriented to time, place, history taking. Follows all commands speech and language fluent, every few sentences, may have lisp with a word Cranial nerve II-XII: Pupils were equal round reactive to light. Extraocular movements were full, visual field were full on confrontational test. Facial sensation and strength were normal.  Head turning and shoulder shrug  were normal and symmetric. Motor: The motor testing reveals 5 over 5 strength of all 4 extremities. Good symmetric motor tone is noted throughout.  Sensory: Sensory testing is intact to soft touch on all 4 extremities. No evidence of extinction is noted.  Coordination: Cerebellar testing reveals good finger-nose-finger and heel-to-shin bilaterally.  Gait and station: Gait is normal. Tandem gait is normal.   Reflexes:  Deep tendon reflexes are symmetric and normal bilaterally.   DIAGNOSTIC DATA (LABS, IMAGING, TESTING) - I reviewed patient records, labs, notes, testing and imaging myself where available.  Lab Results  Component Value Date   WBC 6.4 12/13/2020   HGB 13.3 12/13/2020   HCT 40.6 12/13/2020   MCV 85 12/13/2020   PLT 318.0 07/08/2020      Component Value Date/Time   NA 141 12/13/2020 1048   K 4.8 12/13/2020 1048   CL 106 12/13/2020 1048   CO2 22 12/13/2020 1048   GLUCOSE 89 12/13/2020 1048   GLUCOSE 90 07/08/2020 0949   BUN 13 12/13/2020 1048   CREATININE 0.82 12/13/2020 1048   CALCIUM 9.4 12/13/2020 1048   PROT 7.0 12/13/2020 1048   ALBUMIN 4.2 12/13/2020 1048   AST 19 12/13/2020 1048   ALT 17 12/13/2020 1048   ALKPHOS 118 12/13/2020 1048   BILITOT 0.3 12/13/2020 1048  GFRNONAA >60 09/24/2019 1836   GFRAA >60 09/24/2019 1836   Lab Results  Component Value Date   CHOL 171 04/03/2021   HDL 34 (L) 04/03/2021   LDLCALC 115 (H) 04/03/2021   TRIG 118 04/03/2021   CHOLHDL 5.0 (H) 04/03/2021   Lab Results  Component Value Date   HGBA1C 5.6 04/03/2021   Lab Results  Component Value Date   VITAMINB12 614 12/13/2020   Lab Results  Component Value Date   TSH 1.210 12/13/2020   ASSESSMENT AND PLAN 45 y.o. year old female  has a past medical history of ADD (attention deficit disorder), Allergy, Anemia, Anxiety, Autonomic instability, Back pain, Chest pain, Chewing difficulty, Chronic fatigue, COVID-19 long hauler, Depression, Diastolic dysfunction (8032), Edema of both lower extremities, Fibroid, Fibromyalgia, Food allergy, GERD (gastroesophageal reflux disease), Heartburn in pregnancy, History of fibromyalgia, Hypertension, IBS (irritable bowel syndrome), Joint pain, Lactose intolerance, Migraines, Neuromuscular disorder ([place]), Other hyperlipidemia, Pneumonia (05/2020), Postpartum care following cesarean delivery (03/17/2012), Preterm labor, SOB (shortness of breath), Status  post cesarean delivery (repeat x 2) (03/17/2012), Swallowing difficulty, Urinary tract infection, and Vitamin D deficiency. here with:  1.  Migraine headache 2.  Diffuse pain, sensory alteration all 4 extremities 3.  Reported speech abnormality, difficulty swallowing 4.  History of fibromyalgia 5.  COVID-19 Infection   -Vani is doing much better today, has gone back to work -Her sensory alteration has essentially resolved with PT -MRI cervical spine was unremarkable Sept 2022 -Headaches are well controlled, never started [facility], on metoprolol -May consider speech therapy evaluation for speech abnormality in future, right now has a lot going on with family and appointments, let me know when ready, I can place referral -I will see her back in 1 year or sooner if needed, she will be followed by Dr. Felecia Shelling since Dr. Jannifer Franklin has retired  Abigail Singh, AGNP-C, [place] 05/04/2021, 10:10 AM [facility] [address], [place], [place] [address] [phone]   I have read the note, and I agree with the clinical assessment and plan.  Richard A. Felecia Shelling, MD, PhD, [facility] Certified in Neurology, Clinical Neurophysiology, Sleep Medicine, Pain Medicine and Neuroimaging  [facility] [address] [place], [place] [address] [phone]

## 2021-05-04 ENCOUNTER — Encounter: Payer: Self-pay | Admitting: Family Medicine

## 2021-05-04 ENCOUNTER — Encounter: Payer: Self-pay | Admitting: Neurology

## 2021-05-04 ENCOUNTER — Ambulatory Visit (INDEPENDENT_AMBULATORY_CARE_PROVIDER_SITE_OTHER): Payer: BC Managed Care – PPO | Admitting: Neurology

## 2021-05-04 VITALS — BP 108/82 | HR 114 | Ht 63.0 in | Wt 185.0 lb

## 2021-05-04 DIAGNOSIS — M797 Fibromyalgia: Secondary | ICD-10-CM

## 2021-05-04 DIAGNOSIS — M25539 Pain in unspecified wrist: Secondary | ICD-10-CM

## 2021-05-04 DIAGNOSIS — U099 Post covid-19 condition, unspecified: Secondary | ICD-10-CM

## 2021-05-04 DIAGNOSIS — G43719 Chronic migraine without aura, intractable, without status migrainosus: Secondary | ICD-10-CM | POA: Diagnosis not present

## 2021-05-04 DIAGNOSIS — R4789 Other speech disturbances: Secondary | ICD-10-CM | POA: Diagnosis not present

## 2021-05-04 DIAGNOSIS — M25529 Pain in unspecified elbow: Secondary | ICD-10-CM

## 2021-05-04 NOTE — Patient Instructions (Addendum)
Great to see you today! Continue your physical therapy  Continue to see your primary doctor See you back in 1 year

## 2021-05-04 NOTE — Telephone Encounter (Signed)
Patient just wanted to give you a FYI. Please Advise thanks.

## 2021-05-12 ENCOUNTER — Other Ambulatory Visit: Payer: Self-pay | Admitting: Cardiology

## 2021-05-17 ENCOUNTER — Other Ambulatory Visit: Payer: Self-pay

## 2021-05-17 ENCOUNTER — Ambulatory Visit (INDEPENDENT_AMBULATORY_CARE_PROVIDER_SITE_OTHER): Payer: BC Managed Care – PPO | Admitting: Psychology

## 2021-05-17 DIAGNOSIS — F4310 Post-traumatic stress disorder, unspecified: Secondary | ICD-10-CM | POA: Diagnosis not present
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
## 2021-05-18 ENCOUNTER — Ambulatory Visit (INDEPENDENT_AMBULATORY_CARE_PROVIDER_SITE_OTHER): Payer: BC Managed Care – PPO | Admitting: Family Medicine

## 2021-05-19 ENCOUNTER — Encounter: Payer: Self-pay | Admitting: Family Medicine

## 2021-05-19 DIAGNOSIS — F900 Attention-deficit hyperactivity disorder, predominantly inattentive type: Secondary | ICD-10-CM

## 2021-05-19 MED ORDER — AMPHETAMINE-DEXTROAMPHET ER 20 MG PO CP24: 20.0000 mg | ORAL_CAPSULE | ORAL | 0 refills | Status: DC

## 2021-05-22 ENCOUNTER — Ambulatory Visit: Payer: Self-pay

## 2021-05-22 ENCOUNTER — Other Ambulatory Visit: Payer: Self-pay

## 2021-05-22 ENCOUNTER — Encounter: Payer: Self-pay | Admitting: Orthopedic Surgery

## 2021-05-22 ENCOUNTER — Ambulatory Visit (INDEPENDENT_AMBULATORY_CARE_PROVIDER_SITE_OTHER): Payer: BC Managed Care – PPO | Admitting: Orthopedic Surgery

## 2021-05-22 VITALS — BP 119/82 | HR 111 | Ht 63.5 in | Wt 191.8 lb

## 2021-05-22 DIAGNOSIS — M25522 Pain in left elbow: Secondary | ICD-10-CM | POA: Diagnosis not present

## 2021-05-22 DIAGNOSIS — M79642 Pain in left hand: Secondary | ICD-10-CM

## 2021-05-22 NOTE — Progress Notes (Signed)
Office Visit Note   Patient: Abigail Singh           Date of Birth: 03/11/1976           MRN: [id number] Visit Date: 05/22/2021              Requested by: Midge Minium, MD [address] [place],  [place] [address] PCP: Midge Minium, MD   Assessment & Plan: Visit Diagnoses:  1. Pain in left elbow   2. 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  She has never taken an anti-inflammatory so will start her on Meloxicam.  Will also refer to hand therapy to work on stretching and strengthening.  I can see her back in 6 weeks or so if she is still symptomatic.   Follow-Up Instructions: No follow-ups on file.   Orders:  Orders Placed This Encounter  Procedures   XR Hand Complete Left   XR Elbow Complete Left (3+View)   No orders of the defined types were placed in this encounter.     Procedures: No procedures performed   Clinical Data: No additional findings.   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  Is been going on since approximate October.  She notes that she took some time off of work and then return to teaching.  Right when she started she had an increased level of activity with subsequent pain after a particularly busy day which she put up several both boards.  She has since noticed pain at the medial aspect of the elbow on the ulnar aspect of the wrist.  This been going on again since October.  Its overall been improving since then she notes that her elbow is at least 50% better if not more so.  Her pain is worse with certain activities such as pronation or supination.  She also has chronic shoulder and back pain for which she sees therapy.   She is done some elbow bracing and wrist bracing with therapy.  She has done some taping which improved symptoms for a while but then result of numbness and tingling in her ring and small finger and was then stopped.  She has that her pain gets worse the day goes on.  She is overall improving but is not back to her baseline.   Review of Systems   Objective: Vital Signs: BP 119/82 (BP Location: Right Arm, Patient Position: Sitting, Cuff Size: Large)    Pulse (!) 111    Ht 5' 3.5" (1.613 m)    Wt 191 lb 12.8 oz (87 kg)    SpO2 98%    BMI 33.44 kg/m   Physical Exam Constitutional:      Appearance: Normal appearance.  Cardiovascular:     Rate and Rhythm: Normal rate.     Pulses: Normal pulses.  Pulmonary:     Effort: Pulmonary effort is normal.  Skin:    General: Skin is warm and dry.     Capillary Refill: Capillary refill takes less than 2 seconds.  Neurological:     Mental Status: She is alert.    Left Hand Exam   Tenderness  Left  hand tenderness location: TTP around distal ECU tendon at level of ulnar head.   Range of Motion  The patient has normal left wrist ROM.  Other  Erythema: absent Sensation: normal Pulse: present  Comments:  No pain w/ ulnocarpal loading.  + ECU synergy test.  No ECU subluxation or instability.  No pain w/ AROM of wrist.  No pain w/ resisted wrist or finger flexion.  Mildly TTP at medial epicondyle.  Negative Tinel at elbow.      Specialty Comments:  No specialty comments available.  Imaging: 3 views of the left wrist and elbow were taken today and reviewed interpreted by me.  They do not demonstrate any significant degenerative changes.  There is a small osteophyte present at what appears to be the coronoid process of the ulna.  The radiocarpal and midcarpal joints are well-maintained.  There is no evidence of acute fracture or instability.   PMFS History: Patient Active Problem List   Diagnosis Date Noted   Essential hypertension  02/21/2021   Dysarthria 02/21/2021   Class 2 severe obesity with serious comorbidity and body mass index (BMI) of 35.0 to 35.9 in adult [facility]) 02/21/2021   Prediabetes 01/17/2021   Urinary tract infection    Preterm labor    Neuromuscular disorder ([facility])    Migraines    History of fibromyalgia    Heartburn in pregnancy    Fibroid    Anxiety    Anemia    Allergy    Tachycardia 09/02/2020   Autonomic instability 09/02/2020   Nocturnal hypoxemia 08/31/2020   Fatigue 08/31/2020   DOE (dyspnea on exertion) 07/08/2020   Alteration in speech 06/28/2020   Depression 06/15/2020   Chronic cough 05/31/2020   Long COVID 05/31/2020   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   Seasonal allergic conjunctivitis 06/24/2018   Other allergic rhinitis 06/24/2018   Other dysphagia 06/24/2018   Fibromyalgia 12/05/2017   Vitamin D deficiency 11/26/2016   Insomnia 11/26/2016   ADHD 11/26/2016   Obesity (BMI 30-39.9) 01/27/2016   Migraine 12/29/2014   Plantar fasciitis, bilateral 11/01/2014   Sialoadenitis 10/21/2013   Status post cesarean delivery (repeat x 2) 03/17/2012   Postpartum care following cesarean delivery 03/17/2012   Physical exam 10/23/2010   FIBROMYALGIA 11/09/2009   Thyroid nodule 10/21/2009   Past Medical History:  Diagnosis Date   ADD (attention deficit disorder)    Allergy    sseasonal   Anemia    as teenager   Anxiety    Autonomic instability    Back pain    Chest pain    Chewing difficulty    Chronic fatigue    COVID-19 long hauler    Depression    Diastolic dysfunction 3154   Edema of both lower extremities    Fibroid    Fibromyalgia    Food allergy    GERD (gastroesophageal reflux disease)    Heartburn in pregnancy    History of fibromyalgia    Hypertension    IBS (irritable bowel syndrome)    Joint pain    Lactose intolerance    Migraines    Neuromuscular disorder ([facility])    fibromyalgia   Other hyperlipidemia    Pneumonia 05/2020    Postpartum care following cesarean delivery 03/17/2012   Preterm labor    SOB (shortness of breath)    Status post cesarean delivery (repeat x 2) 03/17/2012   Swallowing difficulty    Urinary tract infection    Vitamin D deficiency  Family History  Problem Relation Age of Onset   Hyperlipidemia Mother    Diabetes Mother    High Cholesterol Mother    Depression Mother    Hyperlipidemia Father    Diabetes Father    Food Allergy Father    High Cholesterol Father    Heart attack Father    Hypertension Father    Diabetes Maternal Grandmother    Colon polyps Maternal Grandmother    Diabetes Maternal Grandfather    Colon polyps Maternal Grandfather    Heart disease Maternal Grandfather    Colon cancer Paternal Grandmother    Heart disease Paternal Grandfather    Allergic rhinitis Daughter    Eczema Daughter    Anxiety disorder Daughter    Lactose intolerance Daughter    OCD Daughter    Anxiety disorder Daughter    Lactose intolerance Daughter    Anxiety disorder Daughter    Asthma Daughter    Lactose intolerance Daughter    Diabetes Maternal Aunt    Heart failure Other        Grandfather   Heart attack Other        x's 4 Grandfather   Stroke Other        x's 1 Grandfather   Diabetes Other    Angioedema Neg Hx    Immunodeficiency Neg Hx     Past Surgical History:  Procedure Laterality Date   abnormal cells in colon/tatoo area     2012   CESAREAN SECTION  2009   CESAREAN SECTION  03/17/2012   Procedure: CESAREAN SECTION;  Surgeon: Marvene Staff, MD;  Location: [facility];  Service: Obstetrics;  Laterality: N/A;  Repeat C/S.  EDD: 03/23/12   COLONOSCOPY     DILATION AND CURETTAGE OF UTERUS     POLYPECTOMY     tubes in ears     WISDOM TOOTH EXTRACTION     Social History   Occupational History   Not on file  Tobacco Use   Smoking status: Former    Packs/day: 1.00    Years: 11.00    Pack years: 11.00    Types: Cigarettes    Quit date: 05/07/1997     Years since quitting: 24.0   Smokeless tobacco: Never   Tobacco comments:    no E cigs  Vaping Use   Vaping Use: Never used  Substance and Sexual Activity   Alcohol use: No    Alcohol/week: 0.0 standard drinks   Drug use: No   Sexual activity: Yes

## 2021-05-27 ENCOUNTER — Encounter: Payer: Self-pay | Admitting: Family Medicine

## 2021-05-29 ENCOUNTER — Other Ambulatory Visit: Payer: Self-pay

## 2021-05-29 ENCOUNTER — Ambulatory Visit (INDEPENDENT_AMBULATORY_CARE_PROVIDER_SITE_OTHER): Payer: BC Managed Care – PPO | Admitting: Family Medicine

## 2021-05-29 ENCOUNTER — Encounter (INDEPENDENT_AMBULATORY_CARE_PROVIDER_SITE_OTHER): Payer: Self-pay | Admitting: Family Medicine

## 2021-05-29 VITALS — BP 104/76 | HR 77 | Temp 98.3°F | Ht 64.0 in | Wt 186.0 lb

## 2021-05-29 DIAGNOSIS — Z9189 Other specified personal risk factors, not elsewhere classified: Secondary | ICD-10-CM

## 2021-05-29 DIAGNOSIS — M797 Fibromyalgia: Secondary | ICD-10-CM

## 2021-05-29 DIAGNOSIS — E538 Deficiency of other specified B group vitamins: Secondary | ICD-10-CM | POA: Diagnosis not present

## 2021-05-29 DIAGNOSIS — I10 Essential (primary) hypertension: Secondary | ICD-10-CM | POA: Diagnosis not present

## 2021-05-29 DIAGNOSIS — Z6835 Body mass index (BMI) 35.0-35.9, adult: Secondary | ICD-10-CM

## 2021-05-29 DIAGNOSIS — Z6832 Body mass index (BMI) 32.0-32.9, adult: Secondary | ICD-10-CM

## 2021-05-29 DIAGNOSIS — E669 Obesity, unspecified: Secondary | ICD-10-CM | POA: Diagnosis not present

## 2021-05-29 DIAGNOSIS — E66812 Obesity, class 2: Secondary | ICD-10-CM

## 2021-05-29 MED ORDER — AMPHETAMINE-DEXTROAMPHET ER 10 MG PO CP24: 20.0000 mg | ORAL_CAPSULE | Freq: Every day | ORAL | 0 refills | Status: DC

## 2021-05-29 NOTE — Progress Notes (Signed)
Chief Complaint:   OBESITY Abigail Singh is here to discuss her progress with her obesity treatment plan along with follow-up of her obesity related diagnoses. Abigail Singh is on the Category 2 Plan with lunch options and states she is following her eating plan approximately 75% of the time. Abigail Singh states she is walking 15 minutes 3-7 times per week.  Today's visit was #: 9 Starting weight: 209 lbs Starting date: 12/13/2020 Today's weight: 186 lbs Today's date: 05/29/2021 Total lbs lost to date: 23 Total lbs lost since last in-office visit: +1  Interim History: With her husband's renal cancer, pt is eating out more via [employer]. She has gained muscle mass and lost a little fat mass. Pt gained almost 2 lbs of water.  Subjective:   1. Essential hypertension Pt is at her normal BP today. She denies dizziness, symptoms, or concerns.  2. Fibromyalgia Pt is out of Adderall, which she takes for her fibromyalgia and pain, per pt. It also helps with fatigue. She has been less active for a week because of this. She recently got a new Rx.  3. B12 deficiency Pt was out of B12 for 2 weeks and just got it back a week ago.  4. At risk for activity intolerance Abigail Singh is at risk exercise intolerance due to fibromyalgia and other physical limitations.  Assessment/Plan:  No orders of the defined types were placed in this encounter.   There are no discontinued medications.   No orders of the defined types were placed in this encounter.    1. Essential hypertension At goal. Continue current treatment plan.  2. Fibromyalgia Counseling done on fibromyalgia, exercise, proper hydration, prudent diet/nutrition. Continue current plan.  3. B12 deficiency Take OTC B12 500 mcg QD regularly without skipping doses. Pt counseled on how this can contribute to her fatigue.  4. At risk for activity intolerance Abigail Singh was given approximately 9 minutes of exercise intolerance counseling today. She is 46  y.o. female and has risk factors exercise intolerance including obesity. We discussed intensive lifestyle modifications today with an emphasis on specific weight loss instructions and strategies. Abigail Singh will slowly increase activity as tolerated.  Repetitive spaced learning was employed today to elicit superior memory formation and behavioral change.   5. Obesity with current BMI of 32.0  Abigail Singh is currently in the action stage of change. As such, her goal is to continue with weight loss efforts. She has agreed to the Category 2 Plan with lunch options.   Pt would like to increase her activity levels and water intake for next OV.  Exercise goals:  Increase as tolerated.  Behavioral modification strategies: increasing lean protein intake, decreasing simple carbohydrates, and planning for success.  Abigail Singh has agreed to follow-up with our clinic in 2-3 weeks, per pt preference to help her with accountability. She was informed of the importance of frequent follow-up visits to maximize her success with intensive lifestyle modifications for her multiple health conditions.   Objective:   Blood pressure 104/76, pulse 77, temperature 98.3 F (36.8 C), height 5\' 4"  (1.626 m), weight 186 lb (84.4 kg), SpO2 98 %. Body mass index is 31.93 kg/m.  General: Cooperative, alert, well developed, in no acute distress. HEENT: Conjunctivae and lids unremarkable. Cardiovascular: Regular rhythm.  Lungs: Normal work of breathing. Neurologic: No focal deficits.   Lab Results  Component Value Date   CREATININE 0.82 12/13/2020   BUN 13 12/13/2020   NA 141 12/13/2020   K 4.8 12/13/2020   CL  106 12/13/2020   CO2 22 12/13/2020   Lab Results  Component Value Date   ALT 17 12/13/2020   AST 19 12/13/2020   ALKPHOS 118 12/13/2020   BILITOT 0.3 12/13/2020   Lab Results  Component Value Date   HGBA1C 5.6 04/03/2021   HGBA1C 5.8 (H) 12/13/2020   HGBA1C 5.6 12/05/2017   Lab Results  Component  Value Date   INSULIN 15.2 04/03/2021   INSULIN 13.1 12/13/2020   Lab Results  Component Value Date   TSH 1.210 12/13/2020   Lab Results  Component Value Date   CHOL 171 04/03/2021   HDL 34 (L) 04/03/2021   LDLCALC 115 (H) 04/03/2021   TRIG 118 04/03/2021   CHOLHDL 5.0 (H) 04/03/2021   Lab Results  Component Value Date   VD25OH 55.7 12/13/2020   VD25OH 53.78 07/11/2020   VD25OH 47.88 07/15/2019   Lab Results  Component Value Date   WBC 6.4 12/13/2020   HGB 13.3 12/13/2020   HCT 40.6 12/13/2020   MCV 85 12/13/2020   PLT 318.0 07/08/2020     Attestation Statements:   Reviewed by clinician on day of visit: allergies, medications, problem list, medical history, surgical history, family history, social history, and previous encounter notes.  Coral Ceo, CMA, am acting as transcriptionist for [employer], DO.  I have reviewed the above documentation for accuracy and completeness, and I agree with the above. Marjory Sneddon, D.O.  The [place] was signed into law in 2016 which includes the topic of electronic health records.  This provides immediate access to information in MyChart.  This includes consultation notes, operative notes, office notes, lab results and pathology reports.  If you have any questions about what you read please let us know at your next visit so we can discuss your concerns and take corrective action if need be.  We are right here with you.

## 2021-05-31 ENCOUNTER — Other Ambulatory Visit: Payer: Self-pay

## 2021-05-31 ENCOUNTER — Ambulatory Visit (INDEPENDENT_AMBULATORY_CARE_PROVIDER_SITE_OTHER): Payer: BC Managed Care – PPO | Admitting: Psychology

## 2021-05-31 DIAGNOSIS — F4310 Post-traumatic stress disorder, unspecified: Secondary | ICD-10-CM

## 2021-05-31 NOTE — Progress Notes (Signed)
[facility] Counselor/Therapist Progress Note  Patient ID: Abigail Singh, MRN: [id number],    Date: 05/31/2021  Time Spent: 60 minutes  Treatment Type: Individual Therapy  Reported Symptoms: Hx of flashbacks, worry, depression, history of hypervigilance.  Mental Status Exam: Appearance:  Casual     Behavior: Appropriate  Motor: Normal  Speech/Language:  Normal Rate  Affect: Blunt  Mood: normal  Thought process: normal  Thought content:   WNL  Sensory/Perceptual disturbances:   WNL  Orientation: oriented to person, place, time/date, and situation  Attention: Good  Concentration: Good  Memory: WNL  Fund of knowledge:  Good  Insight:   Good  Judgment:  Good  Impulse Control: Good   Risk Assessment: Danger to Self:  No Self-injurious Behavior: No Danger to Others: No Duty to Warn:no Physical Aggression / Violence:No  Access to Firearms a concern: No  Gang Involvement:No   Subjective: The patient attended a face-to-face individual therapy session in the office today.  The patient presents with a blunted affect and mood is pleasant.  The patient states that she has been doing okay since I saw her last.  Her husband is doing better and her children are getting over some illnesses, but are on the mend.  We talked about how things have settled down some since her husband's cancer scare.  The patient reports that she is not sleeping well because of her youngest daughter.  Her youngest daughter seems to still have some anxiety about her father's illness.  The patient seems to be managing things right now and does not seem to be having too many issues with symptoms of PTSD.  Will continue to evaluate this.  Interventions: Cognitive Behavioral Therapy, Insight-Oriented, and Interpersonal  Diagnosis:PTSD (post-traumatic stress disorder)  Plan: Please see Treatment plan in Therapy Charts with target date of 05/22/2022.  The patient has approved this plan and is making  progress.  Will see patient again in 2 weeks.  Keonta Alsip G Kristopher Delk, LCSW                  Ebbie Cherry G Reola Buckles, LCSW

## 2021-06-13 ENCOUNTER — Other Ambulatory Visit: Payer: Self-pay | Admitting: Internal Medicine

## 2021-06-14 ENCOUNTER — Ambulatory Visit: Payer: BC Managed Care – PPO | Admitting: Psychology

## 2021-06-22 ENCOUNTER — Encounter: Payer: Self-pay | Admitting: Family Medicine

## 2021-06-23 ENCOUNTER — Encounter: Payer: Self-pay | Admitting: Family Medicine

## 2021-06-23 ENCOUNTER — Other Ambulatory Visit: Payer: Self-pay

## 2021-06-23 ENCOUNTER — Telehealth (INDEPENDENT_AMBULATORY_CARE_PROVIDER_SITE_OTHER): Payer: BC Managed Care – PPO | Admitting: Family Medicine

## 2021-06-23 DIAGNOSIS — J069 Acute upper respiratory infection, unspecified: Secondary | ICD-10-CM

## 2021-06-23 MED ORDER — DOXYCYCLINE HYCLATE 100 MG PO TABS: 100.0000 mg | ORAL_TABLET | Freq: Two times a day (BID) | ORAL | 0 refills | Status: DC

## 2021-06-23 MED ORDER — AZELASTINE HCL 0.1 % NA SOLN: 2.0000 | Freq: Two times a day (BID) | NASAL | 12 refills | Status: AC

## 2021-06-23 NOTE — Progress Notes (Signed)
° °  Abigail Singh is a 46 y.o. female who presents today for a virtual office visit.  Assessment/Plan:  Sinusitis Given that symptoms have been persistent for the last couple of weeks we will start antibiotics.  She has a penicillin and macrolide allergy.  We will start doxycycline.  Also start Astelin.  Encouraged hydration.  She can continue over-the-counter meds.  We discussed reasons to return to care.  Follow-up as needed.    Subjective:  HPI:  Patient here with concerns for sinus infection. Symptoms started 2 weeks ago. Symptoms include sinus pressure and congestion. Tried OTC medications including dayquil and nyquil without significant improvement. Home covid tests have been negative. Some cough.        Objective/Observations  Physical Exam: Gen: NAD, resting comfortably Pulm: Normal work of breathing Neuro: Grossly normal, moves all extremities Psych: Normal affect and thought content  Virtual Visit via Video   I connected with Abigail Singh on 06/23/21 at 11:20 AM EST by a video enabled telemedicine application and verified that I am speaking with the correct person using two identifiers. The limitations of evaluation and management by telemedicine and the availability of in person appointments were discussed. The patient expressed understanding and agreed to proceed.   Patient location: Home Provider location: [place] participating in the virtual visit: Myself and Patient     Algis Greenhouse. Jerline Pain, MD 06/23/2021 10:38 AM

## 2021-06-28 ENCOUNTER — Other Ambulatory Visit: Payer: Self-pay

## 2021-06-28 ENCOUNTER — Ambulatory Visit (INDEPENDENT_AMBULATORY_CARE_PROVIDER_SITE_OTHER): Payer: BC Managed Care – PPO | Admitting: Psychology

## 2021-06-28 DIAGNOSIS — F4310 Post-traumatic stress disorder, unspecified: Secondary | ICD-10-CM | POA: Diagnosis not present

## 2021-06-28 NOTE — Progress Notes (Signed)
[facility] Counselor/Therapist Progress Note  Patient ID: Abigail Singh, MRN: [id number],    Date: 06/28/2021  Time Spent: 60 minutes  Treatment Type: Individual Therapy  Reported Symptoms: Hx of flashbacks, worry, depression, history of hypervigilance.  Mental Status Exam: Appearance:  Casual     Behavior: Appropriate  Motor: Normal  Speech/Language:  Normal Rate  Affect: Blunt  Mood: normal  Thought process: normal  Thought content:   WNL  Sensory/Perceptual disturbances:   WNL  Orientation: oriented to person, place, time/date, and situation  Attention: Good  Concentration: Good  Memory: WNL  Fund of knowledge:  Good  Insight:   Good  Judgment:  Good  Impulse Control: Good   Risk Assessment: Danger to Self:  No Self-injurious Behavior: No Danger to Others: No Duty to Warn:no Physical Aggression / Violence:No  Access to Firearms a concern: No  Gang Involvement:No   Subjective: The patient attended a face-to-face individual therapy session in the office today.  The patient presents with a blunted affect and mood is pleasant.  The patient reports that things are settled now in her family and she feels like they are doing well.  We talked today about whether we wanted to spread our sessions out because the patient is doing so well.  The patient does not seem to have as many issues with her posttraumatic stress as she did when I first started seeing her.  We talked about what symptoms she might be having now and it appears that some of it is really more about memories rather than flashbacks or reliving the events.  She is dealing with issues with her daughters well and occasionally needs to talk these things out to get a different perspective about how to manage them.  We decided to move our sessions to 1 time a month.  Interventions: Cognitive Behavioral Therapy, Insight-Oriented, and Interpersonal  Diagnosis:PTSD (post-traumatic stress disorder)  Plan:  Please see Treatment plan in Therapy Charts with target date of 05/22/2022.  The patient has approved this plan and is making progress.  Will see patient again in 2 weeks.  Bookert Guzzi G Robbyn Hodkinson, LCSW                  Denaja Verhoeven G Danton Palmateer, LCSW               Tomasa Dobransky G Shakyra Mattera, LCSW

## 2021-07-03 ENCOUNTER — Other Ambulatory Visit: Payer: Self-pay

## 2021-07-03 ENCOUNTER — Encounter (INDEPENDENT_AMBULATORY_CARE_PROVIDER_SITE_OTHER): Payer: Self-pay | Admitting: Family Medicine

## 2021-07-03 ENCOUNTER — Ambulatory Visit (INDEPENDENT_AMBULATORY_CARE_PROVIDER_SITE_OTHER): Payer: BC Managed Care – PPO | Admitting: Family Medicine

## 2021-07-03 VITALS — BP 115/76 | HR 78 | Temp 98.2°F | Ht 64.0 in | Wt 185.0 lb

## 2021-07-03 DIAGNOSIS — E786 Lipoprotein deficiency: Secondary | ICD-10-CM | POA: Diagnosis not present

## 2021-07-03 DIAGNOSIS — Z9189 Other specified personal risk factors, not elsewhere classified: Secondary | ICD-10-CM | POA: Diagnosis not present

## 2021-07-03 DIAGNOSIS — E860 Dehydration: Secondary | ICD-10-CM

## 2021-07-03 DIAGNOSIS — R7303 Prediabetes: Secondary | ICD-10-CM | POA: Diagnosis not present

## 2021-07-03 DIAGNOSIS — I10 Essential (primary) hypertension: Secondary | ICD-10-CM

## 2021-07-03 DIAGNOSIS — Z6831 Body mass index (BMI) 31.0-31.9, adult: Secondary | ICD-10-CM

## 2021-07-03 DIAGNOSIS — E669 Obesity, unspecified: Secondary | ICD-10-CM

## 2021-07-04 NOTE — Progress Notes (Signed)
Chief Complaint:   OBESITY Abigail Singh is here to discuss her progress with her obesity treatment plan along with follow-up of her obesity related diagnoses. Abigail Singh is on the Category 2 Plan with breakfast options and lunch options and states she is following her eating plan approximately 50% of the time. Abigail Singh states she is walking 20-30 minutes 2-3 times per week.  Today's visit was #: 10 Starting weight: 209 lbs Starting date: 12/13/2020 Today's weight: 185 lbs Today's date: 07/03/2021 Total lbs lost to date: 24 Total lbs lost since last in-office visit: 1  Interim History: Abigail Singh was dx with sinusitis and was seen by PCP, Dr. Jerline Pain, via video visit. Note reviewed and pt has been on doxycycline since 06/23/2021. She has been eating bagels with cream cheese for breakfast.  Subjective:   1. Prediabetes With pt's increase of simple carbs in her diet, he has had more cravings for sugar and simple carbs.  2. Essential hypertension At goal. Pt's at home BP 120-110's/70's. Medication: Toprol, Lasix prn  3. Mild dehydration Pt drinks about 60 oz of water per day.  4. Low HDL (under 40) Pt's last LDL was 115 and HDL low at 34 on last check.  5. At risk for diabetes mellitus Abigail Singh is at higher than average risk for developing diabetes due to her obesity, pre-diabetes, and continued increased carb intake.  Assessment/Plan:  No orders of the defined types were placed in this encounter.   There are no discontinued medications.   No orders of the defined types were placed in this encounter.    1. Prediabetes Check labs at next OV, if labs aren't done with PCP during CPE on 07/13/2021. Decrease simple carbs and increase proteins.  2. Essential hypertension At goal. Continue to decrease salt/prepared foods, increase water intake, and continue weight loss with home blood pressure monitoring.  3. Mild dehydration Increase water intake. Counseling done.  4. Low HDL (under  40) Counseling done. Recheck FLP and CMP in the near future, if not done with PCP. Pt reminded that cream cheese has saturated fats along with other cheeses, and pt needs to limit it per meal plan guidelines.  5. At risk for diabetes mellitus - Abigail Singh was given diabetes prevention education and counseling today of more than 10 minutes.  - Counseled patient on pathophysiology of disease and meaning/ implication of lab results.  - Reviewed how certain foods can either stimulate or inhibit insulin release, and subsequently affect hunger pathways  - Importance of following a healthy meal plan with limiting amounts of simple carbohydrates discussed with patient - Effects of regular aerobic exercise on blood sugar regulation reviewed and encouraged an eventual goal of 30 min 5d/week or more as a minimum.  - Briefly discussed treatment options, which always include dietary and lifestyle modification as first line.   - Handouts provided at patient's desire and/or told to go online to the American Diabetes Association website for further information.  6. Obesity with current BMI of 31.8 Abigail Singh is currently in the action stage of change. As such, her goal is to continue with weight loss efforts. She has agreed to the Category 2 Plan with breakfast options.   Reminded pt that Fairlife and cabot cheeses are lactose free. Try to decrease simple carbs.  Exercise goals: For substantial health benefits, adults should do at least 150 minutes (2 hours and 30 minutes) a week of moderate-intensity, or 75 minutes (1 hour and 15 minutes) a week of vigorous-intensity aerobic physical  activity, or an equivalent combination of moderate- and vigorous-intensity aerobic activity. Aerobic activity should be performed in episodes of at least 10 minutes, and preferably, it should be spread throughout the week.  Behavioral modification strategies: increasing lean protein intake, decreasing simple carbohydrates, increasing  water intake, and meal planning and cooking strategies.  Abigail Singh has agreed to follow-up with our clinic in 4 weeks. She was informed of the importance of frequent follow-up visits to maximize her success with intensive lifestyle modifications for her multiple health conditions.   Objective:   Blood pressure 115/76, pulse 78, temperature 98.2 F (36.8 C), height 5\' 4"  (1.626 m), weight 185 lb (83.9 kg), SpO2 98 %. Body mass index is 31.76 kg/m.  General: Cooperative, alert, well developed, in no acute distress. HEENT: Conjunctivae and lids unremarkable. Cardiovascular: Regular rhythm.  Lungs: Normal work of breathing. Neurologic: No focal deficits.   Lab Results  Component Value Date   CREATININE 0.82 12/13/2020   BUN 13 12/13/2020   NA 141 12/13/2020   K 4.8 12/13/2020   CL 106 12/13/2020   CO2 22 12/13/2020   Lab Results  Component Value Date   ALT 17 12/13/2020   AST 19 12/13/2020   ALKPHOS 118 12/13/2020   BILITOT 0.3 12/13/2020   Lab Results  Component Value Date   HGBA1C 5.6 04/03/2021   HGBA1C 5.8 (H) 12/13/2020   HGBA1C 5.6 12/05/2017   Lab Results  Component Value Date   INSULIN 15.2 04/03/2021   INSULIN 13.1 12/13/2020   Lab Results  Component Value Date   TSH 1.210 12/13/2020   Lab Results  Component Value Date   CHOL 171 04/03/2021   HDL 34 (L) 04/03/2021   LDLCALC 115 (H) 04/03/2021   TRIG 118 04/03/2021   CHOLHDL 5.0 (H) 04/03/2021   Lab Results  Component Value Date   VD25OH 55.7 12/13/2020   VD25OH 53.78 07/11/2020   VD25OH 47.88 07/15/2019   Lab Results  Component Value Date   WBC 6.4 12/13/2020   HGB 13.3 12/13/2020   HCT 40.6 12/13/2020   MCV 85 12/13/2020   PLT 318.0 07/08/2020    Attestation Statements:   Reviewed by clinician on day of visit: allergies, medications, problem list, medical history, surgical history, family history, social history, and previous encounter notes.  Coral Ceo, CMA, am acting as  transcriptionist for [employer], DO.  I have reviewed the above documentation for accuracy and completeness, and I agree with the above. Marjory Sneddon, D.O.  The [place] was signed into law in 2016 which includes the topic of electronic health records.  This provides immediate access to information in MyChart.  This includes consultation notes, operative notes, office notes, lab results and pathology reports.  If you have any questions about what you read please let us know at your next visit so we can discuss your concerns and take corrective action if need be.  We are right here with you.

## 2021-07-12 ENCOUNTER — Ambulatory Visit: Payer: BC Managed Care – PPO | Admitting: Psychology

## 2021-07-13 ENCOUNTER — Ambulatory Visit (INDEPENDENT_AMBULATORY_CARE_PROVIDER_SITE_OTHER): Payer: BC Managed Care – PPO | Admitting: Family Medicine

## 2021-07-13 ENCOUNTER — Encounter: Payer: Self-pay | Admitting: Family Medicine

## 2021-07-13 VITALS — BP 118/74 | HR 62 | Temp 98.4°F | Resp 16 | Ht 63.5 in | Wt 189.6 lb

## 2021-07-13 DIAGNOSIS — Z0000 Encounter for general adult medical examination without abnormal findings: Secondary | ICD-10-CM | POA: Diagnosis not present

## 2021-07-13 DIAGNOSIS — Z1211 Encounter for screening for malignant neoplasm of colon: Secondary | ICD-10-CM

## 2021-07-13 DIAGNOSIS — E669 Obesity, unspecified: Secondary | ICD-10-CM

## 2021-07-13 DIAGNOSIS — E559 Vitamin D deficiency, unspecified: Secondary | ICD-10-CM | POA: Diagnosis not present

## 2021-07-13 LAB — BASIC METABOLIC PANEL
BUN: 13 mg/dL (ref 6–23)
CO2: 27 mEq/L (ref 19–32)
Calcium: 9.2 mg/dL (ref 8.4–10.5)
Chloride: 104 mEq/L (ref 96–112)
Creatinine, Ser: 0.81 mg/dL (ref 0.40–1.20)
GFR: 87.62 mL/min (ref >60.00)
Glucose, Bld: 92 mg/dL (ref 70–99)
Potassium: 4.4 mEq/L (ref 3.5–5.1)
Sodium: 139 mEq/L (ref 135–145)

## 2021-07-13 LAB — CBC WITH DIFFERENTIAL/PLATELET
Basophils Absolute: 0 10*3/uL (ref 0.0–0.1)
Basophils Relative: 0.6 % (ref 0.0–3.0)
Eosinophils Absolute: 0.1 10*3/uL (ref 0.0–0.7)
Eosinophils Relative: 1.7 % (ref 0.0–5.0)
HCT: 41.4 % (ref 36.0–46.0)
Hemoglobin: 13.7 g/dL (ref 12.0–15.0)
Lymphocytes Relative: 33.3 % (ref 12.0–46.0)
Lymphs Abs: 2.2 10*3/uL (ref 0.7–4.0)
MCHC: 33.1 g/dL (ref 30.0–36.0)
MCV: 85.3 fl (ref 78.0–100.0)
Monocytes Absolute: 0.5 10*3/uL (ref 0.1–1.0)
Monocytes Relative: 7.7 % (ref 3.0–12.0)
Neutro Abs: 3.7 10*3/uL (ref 1.4–7.7)
Neutrophils Relative %: 56.7 % (ref 43.0–77.0)
Platelets: 292 10*3/uL (ref 150.0–400.0)
RBC: 4.85 Mil/uL (ref 3.87–5.11)
RDW: 14 % (ref 11.5–15.5)
WBC: 6.6 10*3/uL (ref 4.0–10.5)

## 2021-07-13 LAB — HEPATIC FUNCTION PANEL
ALT: 9 U/L (ref 0–35)
AST: 13 U/L (ref 0–37)
Albumin: 4.4 g/dL (ref 3.5–5.2)
Alkaline Phosphatase: 100 U/L (ref 39–117)
Bilirubin, Direct: 0.1 mg/dL (ref 0.0–0.3)
Total Bilirubin: 0.4 mg/dL (ref 0.2–1.2)
Total Protein: 7.1 g/dL (ref 6.0–8.3)

## 2021-07-13 LAB — LIPID PANEL
Cholesterol: 161 mg/dL (ref 0–200)
HDL: 38.2 mg/dL — ABNORMAL LOW (ref >39.00)
LDL Cholesterol: 97 mg/dL (ref 0–99)
NonHDL: 122.55
Total CHOL/HDL Ratio: 4
Triglycerides: 129 mg/dL (ref 0.0–149.0)
VLDL: 25.8 mg/dL (ref 0.0–40.0)

## 2021-07-13 LAB — VITAMIN D 25 HYDROXY (VIT D DEFICIENCY, FRACTURES): VITD: 54.79 ng/mL (ref 30.00–100.00)

## 2021-07-13 LAB — TSH: TSH: 1.1 u[IU]/mL (ref 0.35–5.50)

## 2021-07-13 NOTE — Assessment & Plan Note (Signed)
Pt is down 30 lbs since last visit!  Applauded her efforts and encouraged her to continue.  Check labs to risk stratify.  Will follow. ?

## 2021-07-13 NOTE — Patient Instructions (Addendum)
Follow up in 1 year or as needed ?We'll notify you of your lab results and make any changes if needed ?Continue to work on healthy diet and regular exercise- you're doing great!!! ?We'll call you with your GI appt for repeat colonoscopy ?Call with any questions or concerns ?Stay Safe!  Stay Healthy! ?Happy Spring!! ? ?

## 2021-07-13 NOTE — Assessment & Plan Note (Signed)
Pt's PE WNL w/ exception of obesity.  UTD on pap, mammo.  Due for repeat colonoscopy- referral placed.  UTD on immunizations.  Check labs.  Anticipatory guidance provided.  ?

## 2021-07-13 NOTE — Progress Notes (Signed)
? ?  Subjective:  ? ? Patient ID: Abigail Singh, female    DOB: Dec 22, 1975, 46 y.o.   MRN: [id number] ? ?HPI ?CPE- UTD on Tdap, pap, mammo (per pt report).  Due for colonoscopy ? ?Patient Care Team  ?  Relationship Specialty Notifications Start End  ?Midge Minium, MD PCP - General   04/19/10   ?Berniece Salines, DO PCP - Cardiology Cardiology  11/11/20   ?Servando Salina, MD Consulting Physician Obstetrics and Gynecology  11/26/16   ?Irene Shipper, MD Consulting Physician Gastroenterology  07/11/20   ?Tanda Rockers, MD Consulting Physician Pulmonary Disease  07/11/20   ?  ?Health Maintenance  ?Topic Date Due  ? COLONOSCOPY (Pts 45-21yr Insurance coverage will need to be confirmed)  11/13/2019  ? COVID-19 Vaccine (4 - Booster for Pfizer series) 04/24/2020  ? Hepatitis C Screening  09/29/2021 (Originally 01/13/1994)  ? MAMMOGRAM  11/28/2021 (Originally 03/19/2020)  ? TETANUS/TDAP  10/31/2021  ? PAP SMEAR-Modifier  03/19/2022  ? INFLUENZA VACCINE  Completed  ? HIV Screening  Completed  ? HPV VACCINES  Aged Out  ?  ? ? ?Review of Systems ?Answered excluding all long COVID sxs-  ? ?Patient reports no vision/ hearing changes, adenopathy,fever, persistant/recurrent hoarseness , swallowing issues, chest pain, palpitations, edema, persistant/recurrent cough, hemoptysis, dyspnea (rest/exertional/paroxysmal nocturnal), gastrointestinal bleeding (melena, rectal bleeding), abdominal pain, significant heartburn, bowel changes, GU symptoms (dysuria, hematuria, incontinence), Gyn symptoms (abnormal  bleeding, pain),  syncope, focal weakness, memory loss, numbness & tingling, skin/hair/nail changes, abnormal bruising or bleeding, anxiety, or depression.  ? ?+ 30 lb intentional weight loss since last visit ? ?This visit occurred during the SARS-CoV-2 public health emergency.  Safety protocols were in place, including screening questions prior to the visit, additional usage of staff PPE, and extensive cleaning of exam room while  observing appropriate contact time as indicated for disinfecting solutions.   ?   ?Objective:  ? Physical Exam ?General Appearance:    Alert, cooperative, no distress, appears stated age  ?Head:    Normocephalic, without obvious abnormality, atraumatic  ?Eyes:    PERRL, conjunctiva/corneas clear, EOM's intact, fundi  ?  benign, both eyes  ?Ears:    Normal TM's and external ear canals, both ears  ?Nose:   Deferred due to COVID  ?Throat:   ?Neck:   Supple, symmetrical, trachea midline, no adenopathy;  ?  Thyroid: no enlargement/tenderness/nodules  ?Back:     Symmetric, no curvature, ROM normal, no CVA tenderness  ?Lungs:     Clear to auscultation bilaterally, respirations unlabored  ?Chest Wall:    No tenderness or deformity  ? Heart:    Regular rate and rhythm, S1 and S2 normal, no murmur, rub ?  or gallop  ?Breast Exam:    Deferred to GYN  ?Abdomen:     Soft, non-tender, bowel sounds active all four quadrants,  ?  no masses, no organomegaly  ?Genitalia:    Deferred to GYN  ?Rectal:    ?Extremities:   Extremities normal, atraumatic, no cyanosis or edema  ?Pulses:   2+ and symmetric all extremities  ?Skin:   Skin color, texture, turgor normal, no rashes or lesions  ?Lymph nodes:   Cervical, supraclavicular, and axillary nodes normal  ?Neurologic:   CNII-XII intact, normal strength, sensation and reflexes  ?  throughout  ?  ? ? ? ?   ?Assessment & Plan:  ? ? ?

## 2021-07-13 NOTE — Assessment & Plan Note (Signed)
Check labs and replete prn. 

## 2021-07-18 ENCOUNTER — Encounter: Payer: Self-pay | Admitting: Registered Nurse

## 2021-07-18 ENCOUNTER — Encounter: Payer: Self-pay | Admitting: Family Medicine

## 2021-07-18 ENCOUNTER — Ambulatory Visit (INDEPENDENT_AMBULATORY_CARE_PROVIDER_SITE_OTHER): Payer: BC Managed Care – PPO | Admitting: Registered Nurse

## 2021-07-18 VITALS — BP 113/75 | HR 95 | Temp 97.9°F | Resp 18 | Ht 63.5 in | Wt 188.3 lb

## 2021-07-18 DIAGNOSIS — J029 Acute pharyngitis, unspecified: Secondary | ICD-10-CM

## 2021-07-18 LAB — POCT INFLUENZA A/B
Influenza A, POC: NEGATIVE
Influenza B, POC: NEGATIVE

## 2021-07-18 LAB — POCT RAPID STREP A (OFFICE): Rapid Strep A Screen: NEGATIVE

## 2021-07-18 MED ORDER — CLINDAMYCIN HCL 300 MG PO CAPS: 300.0000 mg | ORAL_CAPSULE | Freq: Three times a day (TID) | ORAL | 0 refills | Status: DC

## 2021-07-18 NOTE — Patient Instructions (Signed)
Abigail Singh -  ? ?Great to see you ? ?Clinamycin '300mg'$  three times daily for ten days ?Finish course even if feeling better. ? ?Ok to use tylenol '1000mg'$  three times daily ? ?OTC cold and flu ok - just make sure doesn't have acetaminophen (tylenol) in it! ? ?Thanks, ? ?Rich  ?

## 2021-07-18 NOTE — Progress Notes (Signed)
? ?Established Patient Office Visit ? ?Subjective:  ?Patient ID: Abigail Singh, female    DOB: 03-08-1976  Age: 46 y.o. MRN: [id number] ? ?CC:  ?Chief Complaint  ?Patient presents with  ? Sore Throat  ?  Patient states since yesterday she has started to experiencing sore throat , cough , chills and body aches. Patient she took a covid test this morning and was negative.  ? ? ?HPI ?Abigail Singh presents for sore throat ? ?Daughter has strep. ? ?Sore throat.  ?Fevers, chills, sweats.  ?Some nausea, no vomiting or diarrhea. ?Some body aches, mild cough.  ? ?Past Medical History:  ?Diagnosis Date  ? ADD (attention deficit disorder)   ? Allergy   ? sseasonal  ? Anemia   ? as teenager  ? Anxiety   ? Autonomic instability   ? Back pain   ? Chest pain   ? Chewing difficulty   ? Chronic fatigue   ? COVID-19 long hauler   ? Depression   ? Diastolic dysfunction 5784  ? Edema of both lower extremities   ? Fibroid   ? Fibromyalgia   ? Food allergy   ? GERD (gastroesophageal reflux disease)   ? Heartburn in pregnancy   ? History of fibromyalgia   ? Hypertension   ? IBS (irritable bowel syndrome)   ? Joint pain   ? Lactose intolerance   ? Migraines   ? Neuromuscular disorder ([place])   ? fibromyalgia  ? Other hyperlipidemia   ? Pneumonia 05/2020  ? Postpartum care following cesarean delivery 03/17/2012  ? Preterm labor   ? SOB (shortness of breath)   ? Status post cesarean delivery (repeat x 2) 03/17/2012  ? Swallowing difficulty   ? Urinary tract infection   ? Vitamin D deficiency   ? ? ?Past Surgical History:  ?Procedure Laterality Date  ? abnormal cells in colon/tatoo area    ? 2012  ? CESAREAN SECTION  2009  ? CESAREAN SECTION  03/17/2012  ? Procedure: CESAREAN SECTION;  Surgeon: Marvene Staff, MD;  Location: [facility];  Service: Obstetrics;  Laterality: N/A;  Repeat C/S.  EDD: 03/23/12  ? COLONOSCOPY    ? DILATION AND CURETTAGE OF UTERUS    ? POLYPECTOMY    ? tubes in ears    ? WISDOM TOOTH EXTRACTION    ? ? ?Family  History  ?Problem Relation Age of Onset  ? Hyperlipidemia Mother   ? Diabetes Mother   ? High Cholesterol Mother   ? Depression Mother   ? Hyperlipidemia Father   ? Diabetes Father   ? Food Allergy Father   ? High Cholesterol Father   ? Heart attack Father   ? Hypertension Father   ? Diabetes Maternal Grandmother   ? Colon polyps Maternal Grandmother   ? Diabetes Maternal Grandfather   ? Colon polyps Maternal Grandfather   ? Heart disease Maternal Grandfather   ? Colon cancer Paternal Grandmother   ? Heart disease Paternal Grandfather   ? Allergic rhinitis Daughter   ? Eczema Daughter   ? Anxiety disorder Daughter   ? Lactose intolerance Daughter   ? OCD Daughter   ? Anxiety disorder Daughter   ? Lactose intolerance Daughter   ? Anxiety disorder Daughter   ? Asthma Daughter   ? Lactose intolerance Daughter   ? Diabetes Maternal Aunt   ? Heart failure Other   ?     Grandfather  ? Heart attack Other   ?  x's 4 Grandfather  ? Stroke Other   ?     x's 1 Grandfather  ? Diabetes Other   ? Angioedema Neg Hx   ? Immunodeficiency Neg Hx   ? ? ?Social History  ? ?Socioeconomic History  ? Marital status: Married  ?  Spouse name: Not on file  ? Number of children: Not on file  ? Years of education: Not on file  ? Highest education level: Not on file  ?Occupational History  ? Not on file  ?Tobacco Use  ? Smoking status: Former  ?  Packs/day: 1.00  ?  Years: 11.00  ?  Pack years: 11.00  ?  Types: Cigarettes  ?  Quit date: 05/07/1997  ?  Years since quitting: 24.2  ? Smokeless tobacco: Never  ? Tobacco comments:  ?  no E cigs  ?Vaping Use  ? Vaping Use: Never used  ?Substance and Sexual Activity  ? Alcohol use: No  ?  Alcohol/week: 0.0 standard drinks  ? Drug use: No  ? Sexual activity: Yes  ?Other Topics Concern  ? Not on file  ?Social History Narrative  ? Not on file  ? ?Social Determinants of Health  ? ?Financial Resource Strain: Not on file  ?Food Insecurity: Not on file  ?Transportation Needs: Not on file  ?Physical  Activity: Not on file  ?Stress: Not on file  ?Social Connections: Not on file  ?Intimate Partner Violence: Not on file  ? ? ?Outpatient Medications Prior to Visit  ?Medication Sig Dispense Refill  ? acetaminophen (TYLENOL) 325 MG tablet Take 650 mg by mouth every 6 (six) hours as needed for moderate pain.    ? albuterol (VENTOLIN HFA) 108 (90 Base) MCG/ACT inhaler Inhale 1 puff into the lungs as needed for wheezing or shortness of breath.    ? Ascorbic Acid (VITAMIN C) 1000 MG tablet Take 1,000 mg by mouth daily.    ? azelastine (ASTELIN) 0.1 % nasal spray Place 2 sprays into both nostrils 2 (two) times daily. 30 mL 12  ? BIOTIN PO Take 200 mg by mouth daily.    ? Budesonide-Formoterol Fumarate (SYMBICORT IN) Inhale 1 puff into the lungs as needed (shortness of breath).    ? Calcium Carbonate (CALCIUM 500 PO) Take 500 mg by mouth daily.    ? chlorpheniramine (CHLOR-TRIMETON) 4 MG tablet Take 4 mg by mouth every 4 (four) hours as needed. 2AT BEDTIME    ? Cholecalciferol (VITAMIN D) 50 MCG (2000 UT) CAPS Take 2,000 Units by mouth daily.    ? EPINEPHrine 0.3 mg/0.3 mL IJ SOAJ injection Inject 0.3 mLs (0.3 mg total) into the muscle as needed for anaphylaxis. 1 Device 1  ? furosemide (LASIX) 20 MG tablet TAKE 1 TABLET (20 MG TOTAL) BY MOUTH AS NEEDED (BILATERAL LEG SWELLING). 90 tablet 1  ? ibuprofen (ADVIL) 200 MG tablet Take 400 mg by mouth as needed for moderate pain.    ? medroxyPROGESTERone (DEPO-PROVERA) 150 MG/ML injection SMARTSIG:1 Milliliter(s) IM Every 12 Weeks    ? metoprolol succinate (TOPROL XL) 25 MG 24 hr tablet Take 1 tablet (25 mg total) by mouth at bedtime. 90 tablet 3  ? MULTIPLE VITAMIN PO Take 1 capsule by mouth daily.    ? pantoprazole (PROTONIX) 40 MG tablet TAKE 1 TABLET DAILY 30-60 MIN BEFORE 1ST MEAL OF THE DAY 90 tablet 0  ? potassium chloride (KLOR-CON) 10 MEQ tablet TAKE 1 TABLET (10 MEQ TOTAL) BY MOUTH AS NEEDED (BILATERAL LEG SWELLING). 90 tablet  1  ? Probiotic Product (PROBIOTIC-10 PO)  Take by mouth.    ? promethazine-dextromethorphan (PROMETHAZINE-DM) 6.25-15 MG/5ML syrup Take 5 mLs by mouth 4 (four) times daily as needed for cough. 118 mL 0  ? traZODone (DESYREL) 50 MG tablet TAKE 1/2 TO 1 TABLET BY MOUTH AT BEDTIME AS NEEDED FOR SLEEP 90 tablet 2  ? triamcinolone (NASACORT) 55 MCG/ACT AERO nasal inhaler Place 2 sprays into the nose daily. 1 Inhaler 1  ? vitamin B-12 (CYANOCOBALAMIN) 500 MCG tablet Take 500 mcg by mouth daily.    ? Zinc 30 MG TABS Take 30 mg by mouth daily.    ? metoprolol succinate (TOPROL-XL) 50 MG 24 hr tablet Take 1 tablet (50 mg total) by mouth daily. Take with or immediately following a meal. 90 tablet 3  ? ?No facility-administered medications prior to visit.  ? ? ?Allergies  ?Allergen Reactions  ? Aspirin Anaphylaxis  ?  Pt reports being able to take ibuprofen with no problems.  ? Citrus Hives and Swelling  ? Codeine Other (See Comments)  ?  halucinations  ? Eggs Or Egg-Derived Products Other (See Comments)  ?  Patient states ok with flu shots, etc. No anaphalaxis.  Eggs give stomach cramps with diarrhea. After diarrhea ok.-pt eats baked goods that contains eggs with no issues   ? Erythromycin Rash  ? Lactose Intolerance (Gi) Diarrhea  ?  Diarrhea with lactose.  ? Penicillins Rash  ? Sulfonamide Derivatives Rash  ?  Childhood reaction, has taken recently with only stomach irritation.  ? ? ?ROS ?Review of Systems  ?Constitutional: Negative.   ?HENT: Negative.    ?Eyes: Negative.   ?Respiratory: Negative.    ?Cardiovascular: Negative.   ?Gastrointestinal: Negative.   ?Genitourinary: Negative.   ?Musculoskeletal: Negative.   ?Skin: Negative.   ?Neurological: Negative.   ?Psychiatric/Behavioral: Negative.    ?All other systems reviewed and are negative. ? ?  ?Objective:  ?  ?Physical Exam ?Vitals and nursing note reviewed.  ?Constitutional:   ?   General: She is not in acute distress. ?   Appearance: Normal appearance. She is normal weight. She is not ill-appearing,  toxic-appearing or diaphoretic.  ?Cardiovascular:  ?   Rate and Rhythm: Normal rate and regular rhythm.  ?   Heart sounds: Normal heart sounds. No murmur heard. ?  No friction rub. No gallop.  ?Pulmonary:  ?   Effort: Pul

## 2021-07-20 ENCOUNTER — Encounter: Payer: Self-pay | Admitting: Registered Nurse

## 2021-07-20 MED ORDER — PREDNISONE 10 MG PO TABS: ORAL_TABLET | ORAL | 0 refills | Status: DC

## 2021-07-26 ENCOUNTER — Ambulatory Visit (INDEPENDENT_AMBULATORY_CARE_PROVIDER_SITE_OTHER): Payer: BC Managed Care – PPO | Admitting: Psychology

## 2021-07-26 DIAGNOSIS — F4310 Post-traumatic stress disorder, unspecified: Secondary | ICD-10-CM

## 2021-07-26 NOTE — Progress Notes (Signed)
[facility] Counselor/Therapist Progress Note ? ?Patient ID: Abigail Singh, MRN: [id number],   ? ?Date: 07/26/2021 ? ?Time Spent: 60 minutes ? ?Treatment Type: Individual Therapy ? ?Reported Symptoms: Hx of flashbacks, worry, depression, history of hypervigilance. ? ?Mental Status Exam: ?Appearance:  Casual     ?Behavior: Appropriate  ?Motor: Normal  ?Speech/Language:  Normal Rate  ?Affect: Blunt  ?Mood: normal  ?Thought process: normal  ?Thought content:   WNL  ?Sensory/Perceptual disturbances:   WNL  ?Orientation: oriented to person, place, time/date, and situation  ?Attention: Good  ?Concentration: Good  ?Memory: WNL  ?Fund of knowledge:  Good  ?Insight:   Good  ?Judgment:  Good  ?Impulse Control: Good  ? ?Risk Assessment: ?Danger to Self:  No ?Self-injurious Behavior: No ?Danger to Others: No ?Duty to Warn:no ?Physical Aggression / Violence:No  ?Access to Firearms a concern: No  ?Gang Involvement:No  ? ?Subjective: The patient attended a face-to-face individual therapy session in the office today.  The patient presents with a blunted affect and mood is pleasant.  The patient reports that she feels like she has been doing well since the last time she came in.  The patient reports that her mother seems to be coming around a little bit more with Abigail Singh and the question of being transgender.  They tend not to talk about the circumstance.  The patient reports that her husband is doing well and things are going well at work.  She does report some issues occasionally with thinking about Abigail Singh.  The plan is to continue to see her monthly and eventually she will graduate.  The patient has made excellent progress in therapy and now we are just doing check-in's for a while. ?Interventions: Cognitive Behavioral Therapy, Insight-Oriented, and Interpersonal ? ?Diagnosis:PTSD (post-traumatic stress disorder) ? ?Plan: Please see Treatment plan in Therapy Charts with target date of 05/22/2022.  The patient has  approved this plan and is making progress.  Will see patient again in 2 weeks. ? ?[name] G [name], LCSW ? ? ? ? ? ? ? ? ? ? ? ? ? ? ? ? ? ?[name] G [name], LCSW ? ? ? ? ? ? ? ? ? ? ? ? ? ? ?[name] G [name], LCSW ? ? ? ? ? ? ? ? ? ? ? ? ? ? ?[name] G [name], LCSW ?

## 2021-08-01 ENCOUNTER — Telehealth (INDEPENDENT_AMBULATORY_CARE_PROVIDER_SITE_OTHER): Payer: BC Managed Care – PPO | Admitting: Family Medicine

## 2021-08-01 ENCOUNTER — Other Ambulatory Visit: Payer: Self-pay

## 2021-08-01 ENCOUNTER — Encounter (INDEPENDENT_AMBULATORY_CARE_PROVIDER_SITE_OTHER): Payer: Self-pay | Admitting: Family Medicine

## 2021-08-01 DIAGNOSIS — E669 Obesity, unspecified: Secondary | ICD-10-CM

## 2021-08-01 DIAGNOSIS — E559 Vitamin D deficiency, unspecified: Secondary | ICD-10-CM

## 2021-08-01 DIAGNOSIS — F988 Other specified behavioral and emotional disorders with onset usually occurring in childhood and adolescence: Secondary | ICD-10-CM

## 2021-08-01 DIAGNOSIS — E786 Lipoprotein deficiency: Secondary | ICD-10-CM | POA: Diagnosis not present

## 2021-08-01 DIAGNOSIS — Z6831 Body mass index (BMI) 31.0-31.9, adult: Secondary | ICD-10-CM | POA: Diagnosis not present

## 2021-08-03 NOTE — Progress Notes (Signed)
? ? ?TeleHealth Visit:  ?Due to the COVID-19 pandemic, this visit was completed with telemedicine (audio/video) technology to reduce patient and provider exposure as well as to preserve personal protective equipment.  ? ?Abigail Singh has verbally consented to this TeleHealth visit. The patient is located at home, the provider is located at the [facility] office. The participants in this visit include the listed provider and patient. The visit was conducted today via MyChart Video. ? ? ?Chief Complaint: OBESITY ?Abigail Singh is here to discuss her progress with her obesity treatment plan along with follow-up of her obesity related diagnoses. Abigail Singh is on the Category 2 Plan with breakfast options and states she is following her eating plan approximately 50% of the time. Abigail Singh states she is walking for 20 minutes 5 times per week. ? ?Today's visit was #: 62 ?Starting weight: 209 lbs ?Starting date: 12/13/2020 ? ?Interim History: Abigail Singh recently had strep throat seen by her PCP mid-March, and had to be on a steroid dose pak. Not sure if she gained or lost, but she is feeling much better now.  Had to do a video visit today ? ?- Pt recently had labs with her PCP on 07/13/21 ( vit D, cbc, CMP, TSH, FLP) , and pt wanted me to review them and discuss results along with resultant treatment plan with her.  ? ?Subjective:  ? ?1. Attention deficit disorder (ADD) without hyperactivity ?Abigail Singh was without Adderall for 2 months, but Adderall became back in stock, and she has been on it for 2 weeks.  It has made a huge difference in focus, energy and even helps w/ her Fibromyalgia.  I discussed CBC, CMP and TSH labs with the patient today. ? ?2. Vitamin D deficiency ?Abigail Singh's recent labs were within normal limits at 31.. She is on OTC Vit D 2,000 IU daily. Medication compliance is good. I discussed labs with the patient today.  ? ?3. Low HDL (under 40) ?Abigail Singh started walking recently and labs were reviewed with  the patient that were done < 1 month ago at her PCP's office. LDL <100. &  HDL 38 ? ? ?Assessment/Plan:  ? ?1. Attention deficit disorder (ADD) without hyperactivity ?Continue medications per PCP. Abigail Singh is to decrease simple carbs and increase protein to help decrease symptoms. Increase exercise as tolerated and will help with energy and focus.  ? ?2. Vitamin D deficiency ?Labs are stable. Abigail Singh will continue OTC 2,000 IU daily. We will recheck labs in 3-4 months as patient loses weight. Counseling was done. ? ?3. Low HDL (under 40) ?Abigail Singh will continue to decrease sat/trans fats, prudent nutritional plan, and increase walking as tolerated.  ? ?4. Obesity with current BMI of 31.76 ?Abigail Singh is currently in the action stage of change. As such, her goal is to continue with weight loss efforts. She has agreed to the Category 2 Plan with breakfast options.  ? ?Exercise goals: As is. ? ?Behavioral modification strategies: increasing lean protein intake, decreasing simple carbohydrates, and planning for success. ? ?Abigail Singh has agreed to follow-up with our clinic in 4 weeks. She was informed of the importance of frequent follow-up visits to maximize her success with intensive lifestyle modifications for her multiple health conditions. ? ? ?Objective:  ? ?VITALS: virtual- pt unable to obtain ?GENERAL: Alert and in no acute distress. ?CARDIOPULMONARY: No increased WOB. Speaking in clear sentences.  ?PSYCH: Pleasant and cooperative. Speech normal rate and rhythm. Affect is appropriate. Insight and judgement are appropriate. Attention is focused, linear, and appropriate.  ?  NEURO: Oriented as arrived to appointment on time with no prompting.  ? ?Lab Results  ?Component Value Date  ? CREATININE 0.81 07/13/2021  ? BUN 13 07/13/2021  ? NA 139 07/13/2021  ? K 4.4 07/13/2021  ? CL 104 07/13/2021  ? CO2 27 07/13/2021  ? ?Lab Results  ?Component Value Date  ? ALT 9 07/13/2021  ? AST 13 07/13/2021  ? ALKPHOS 100 07/13/2021  ?  BILITOT 0.4 07/13/2021  ? ?Lab Results  ?Component Value Date  ? HGBA1C 5.6 04/03/2021  ? HGBA1C 5.8 (H) 12/13/2020  ? HGBA1C 5.6 12/05/2017  ? ?Lab Results  ?Component Value Date  ? INSULIN 15.2 04/03/2021  ? INSULIN 13.1 12/13/2020  ? ?Lab Results  ?Component Value Date  ? TSH 1.10 07/13/2021  ? ?Lab Results  ?Component Value Date  ? CHOL 161 07/13/2021  ? HDL 38.20 (L) 07/13/2021  ? [place] 97 07/13/2021  ? TRIG 129.0 07/13/2021  ? CHOLHDL 4 07/13/2021  ? ?Lab Results  ?Component Value Date  ? VD25OH 54.79 07/13/2021  ? VD25OH 55.7 12/13/2020  ? VD25OH 53.78 07/11/2020  ? ?Lab Results  ?Component Value Date  ? WBC 6.6 07/13/2021  ? HGB 13.7 07/13/2021  ? HCT 41.4 07/13/2021  ? MCV 85.3 07/13/2021  ? PLT 292.0 07/13/2021  ? ?No results found for: IRON, TIBC, FERRITIN ? ?Attestation Statements:  ? ?Reviewed by clinician on day of visit: allergies, medications, problem list, medical history, surgical history, family history, social history, and previous encounter notes. ? ?I, Trixie Dredge, am acting as transcriptionist for [employer], DO. ? ?I have reviewed the above documentation for accuracy and completeness, and I agree with the above. Marjory Sneddon, D.O. ? ?The [place] was signed into law in 2016 which includes the topic of electronic health records.  This provides immediate access to information in MyChart.  This includes consultation notes, operative notes, office notes, lab results and pathology reports.  If you have any questions about what you read please let us know at your next visit so we can discuss your concerns and take corrective action if need be.  We are right here with you. ? ? ?

## 2021-08-09 ENCOUNTER — Ambulatory Visit: Payer: BC Managed Care – PPO | Admitting: Psychology

## 2021-08-11 ENCOUNTER — Encounter: Payer: Self-pay | Admitting: Family Medicine

## 2021-08-11 NOTE — Telephone Encounter (Signed)
Pt notes her throat tick has increased with her resuming adderall  ? ?Should pt come to office for evaluation or try lower dose ? ? ?Please advise ? ?

## 2021-08-23 ENCOUNTER — Ambulatory Visit (INDEPENDENT_AMBULATORY_CARE_PROVIDER_SITE_OTHER): Payer: BC Managed Care – PPO | Admitting: Psychology

## 2021-08-23 DIAGNOSIS — F4310 Post-traumatic stress disorder, unspecified: Secondary | ICD-10-CM | POA: Diagnosis not present

## 2021-08-23 NOTE — Progress Notes (Signed)
[facility] Counselor/Therapist Progress Note ? ?Patient ID: Abigail Singh, MRN: [id number],   ? ?Date: 08/23/2021 ? ?Time Spent: 60 minutes ? ?Treatment Type: Individual Therapy ? ?Reported Symptoms: Hx of flashbacks, worry, depression, history of hypervigilance. ? ?Mental Status Exam: ?Appearance:  Casual     ?Behavior: Appropriate  ?Motor: Normal  ?Speech/Language:  Normal Rate  ?Affect: Blunt  ?Mood: normal  ?Thought process: normal  ?Thought content:   WNL  ?Sensory/Perceptual disturbances:   WNL  ?Orientation: oriented to person, place, time/date, and situation  ?Attention: Good  ?Concentration: Good  ?Memory: WNL  ?Fund of knowledge:  Good  ?Insight:   Good  ?Judgment:  Good  ?Impulse Control: Good  ? ?Risk Assessment: ?Danger to Self:  No ?Self-injurious Behavior: No ?Danger to Others: No ?Duty to Warn:no ?Physical Aggression / Violence:No  ?Access to Firearms a concern: No  ?Gang Involvement:No  ? ?Subjective: The patient attended a face-to-face individual therapy session in the office today.  The patient presents with a blunted affect and mood is pleasant.  The patient reports that she is worried a little bit about her husband because his labs are not quite as good as they should be.  She also talked about being worried about her children.  The patient has been having some nightmares and when I asked her what the nightmares were about she says that they are most of the time a Sunami coming at them.  This seems to be an indication that she is a little overwhelmed by this things that she has to be responsible for at present.  We talked about her doing some things for herself and she has not done that she has been taking care of other people.  I gave her the assignment to either figure out some place for herself to go and be away from her kids and her husband for a weekend or just to give herself a break.  We will see the patient in 1 month. ? ?Interventions: Cognitive Behavioral Therapy,  Insight-Oriented, and Interpersonal ? ?Diagnosis:PTSD (post-traumatic stress disorder) ? ?Plan: Please see Treatment plan in Therapy Charts with target date of 05/22/2022.  The patient has approved this plan and is making progress.  Will see patient again in 4 weeks. ? ?Daesia Zylka G Salathiel Ferrara, LCSW ? ? ? ? ? ? ? ? ? ? ? ? ? ? ? ? ? ?Mayeli Bornhorst G Quin Mathenia, LCSW ? ? ? ? ? ? ? ? ? ? ? ? ? ? ?Normal Recinos G Shoshannah Faubert, LCSW ? ? ? ? ? ? ? ? ? ? ? ? ? ? ?Estefana Taylor G Kameka Whan, LCSW ? ? ? ? ? ? ? ? ? ? ? ? ? ? ?Aeric Burnham G Tinesha Siegrist, LCSW ?

## 2021-08-31 ENCOUNTER — Encounter (INDEPENDENT_AMBULATORY_CARE_PROVIDER_SITE_OTHER): Payer: Self-pay | Admitting: Family Medicine

## 2021-08-31 ENCOUNTER — Ambulatory Visit (INDEPENDENT_AMBULATORY_CARE_PROVIDER_SITE_OTHER): Payer: BC Managed Care – PPO | Admitting: Family Medicine

## 2021-08-31 VITALS — BP 104/63 | HR 71 | Temp 98.0°F | Ht 64.0 in | Wt 181.0 lb

## 2021-08-31 DIAGNOSIS — E538 Deficiency of other specified B group vitamins: Secondary | ICD-10-CM | POA: Diagnosis not present

## 2021-08-31 DIAGNOSIS — I10 Essential (primary) hypertension: Secondary | ICD-10-CM

## 2021-08-31 DIAGNOSIS — E669 Obesity, unspecified: Secondary | ICD-10-CM

## 2021-08-31 DIAGNOSIS — R7303 Prediabetes: Secondary | ICD-10-CM | POA: Diagnosis not present

## 2021-08-31 DIAGNOSIS — Z9189 Other specified personal risk factors, not elsewhere classified: Secondary | ICD-10-CM

## 2021-08-31 DIAGNOSIS — E559 Vitamin D deficiency, unspecified: Secondary | ICD-10-CM

## 2021-08-31 DIAGNOSIS — E66812 Obesity, class 2: Secondary | ICD-10-CM

## 2021-08-31 DIAGNOSIS — Z6831 Body mass index (BMI) 31.0-31.9, adult: Secondary | ICD-10-CM

## 2021-09-08 ENCOUNTER — Other Ambulatory Visit: Payer: Self-pay | Admitting: Cardiology

## 2021-09-08 ENCOUNTER — Other Ambulatory Visit: Payer: Self-pay | Admitting: Internal Medicine

## 2021-09-09 IMAGING — DX DG CHEST 2V
2 series · 2 of 2 positions shown · non-contrast
Comparison: None.

CLINICAL DATA: Cough, wheezing, chest pressure for 1 month

EXAM:
CHEST - 2 VIEW

[chest pa]
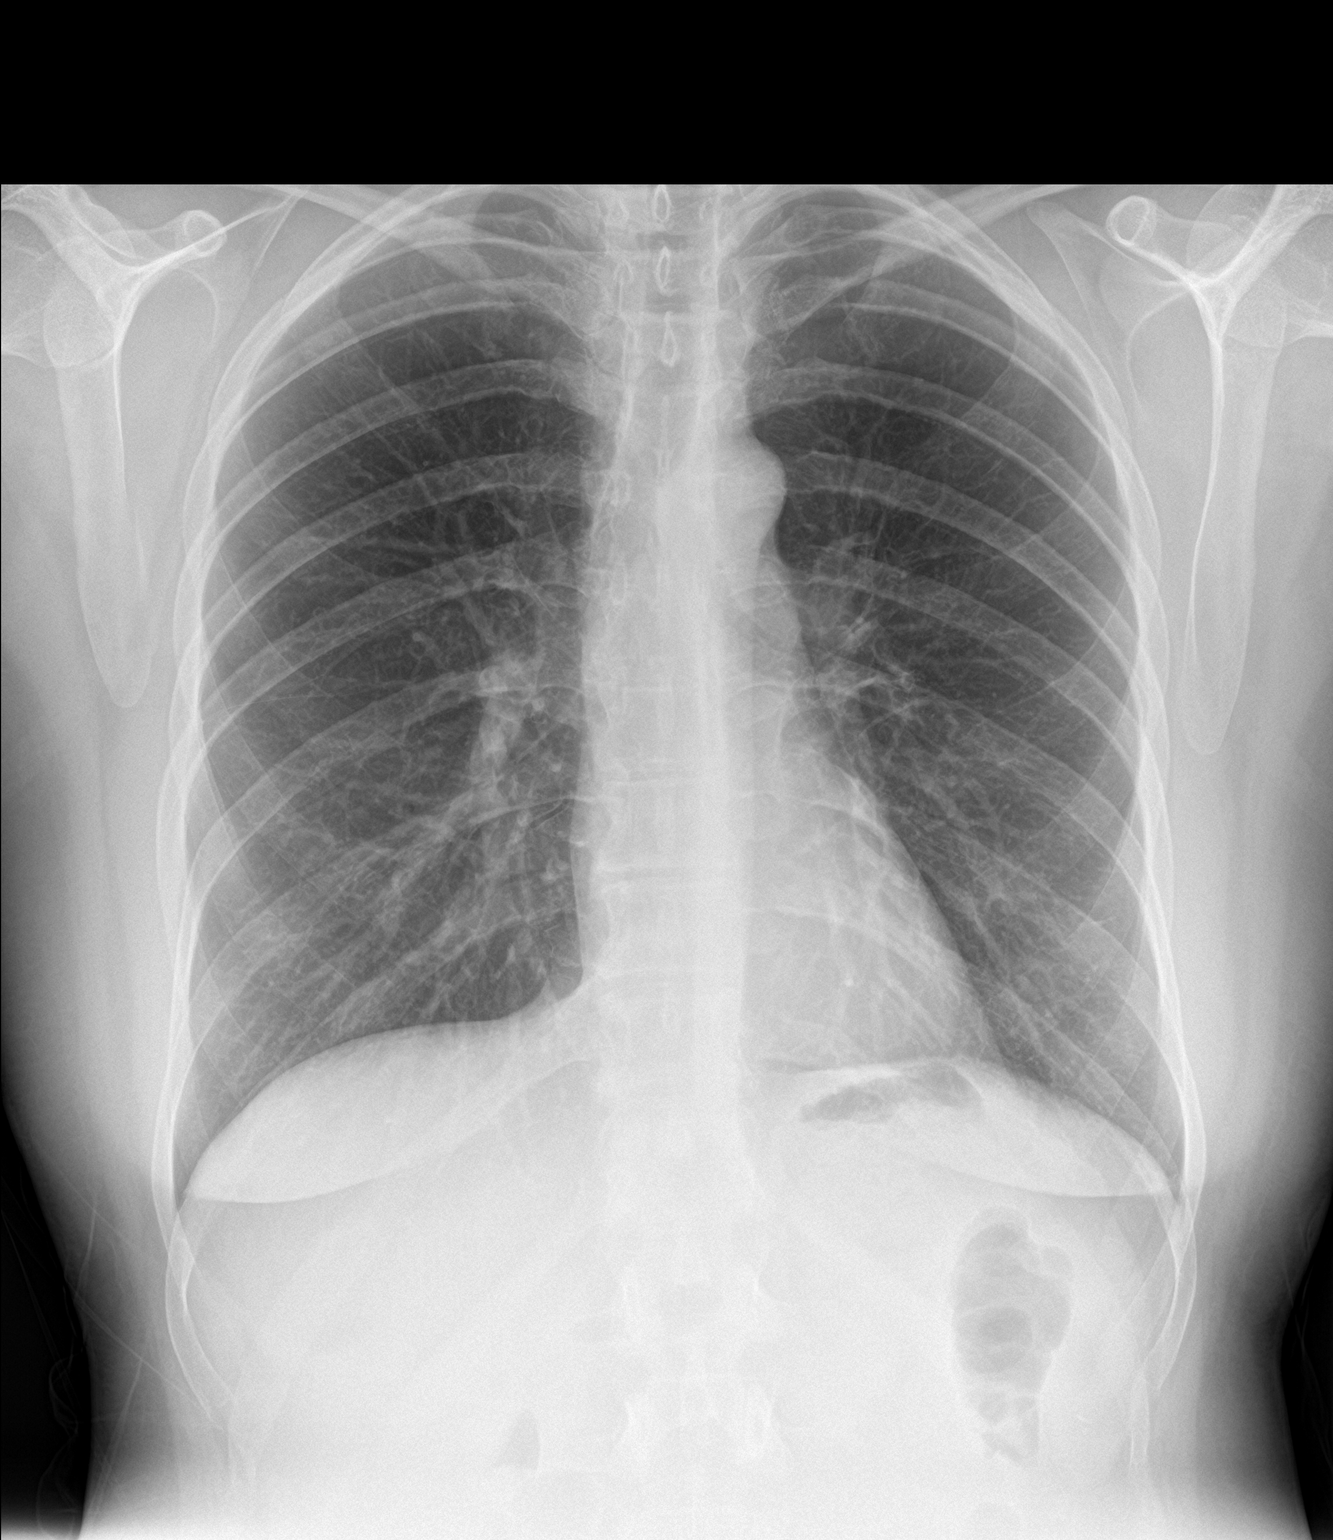

[chest lat]
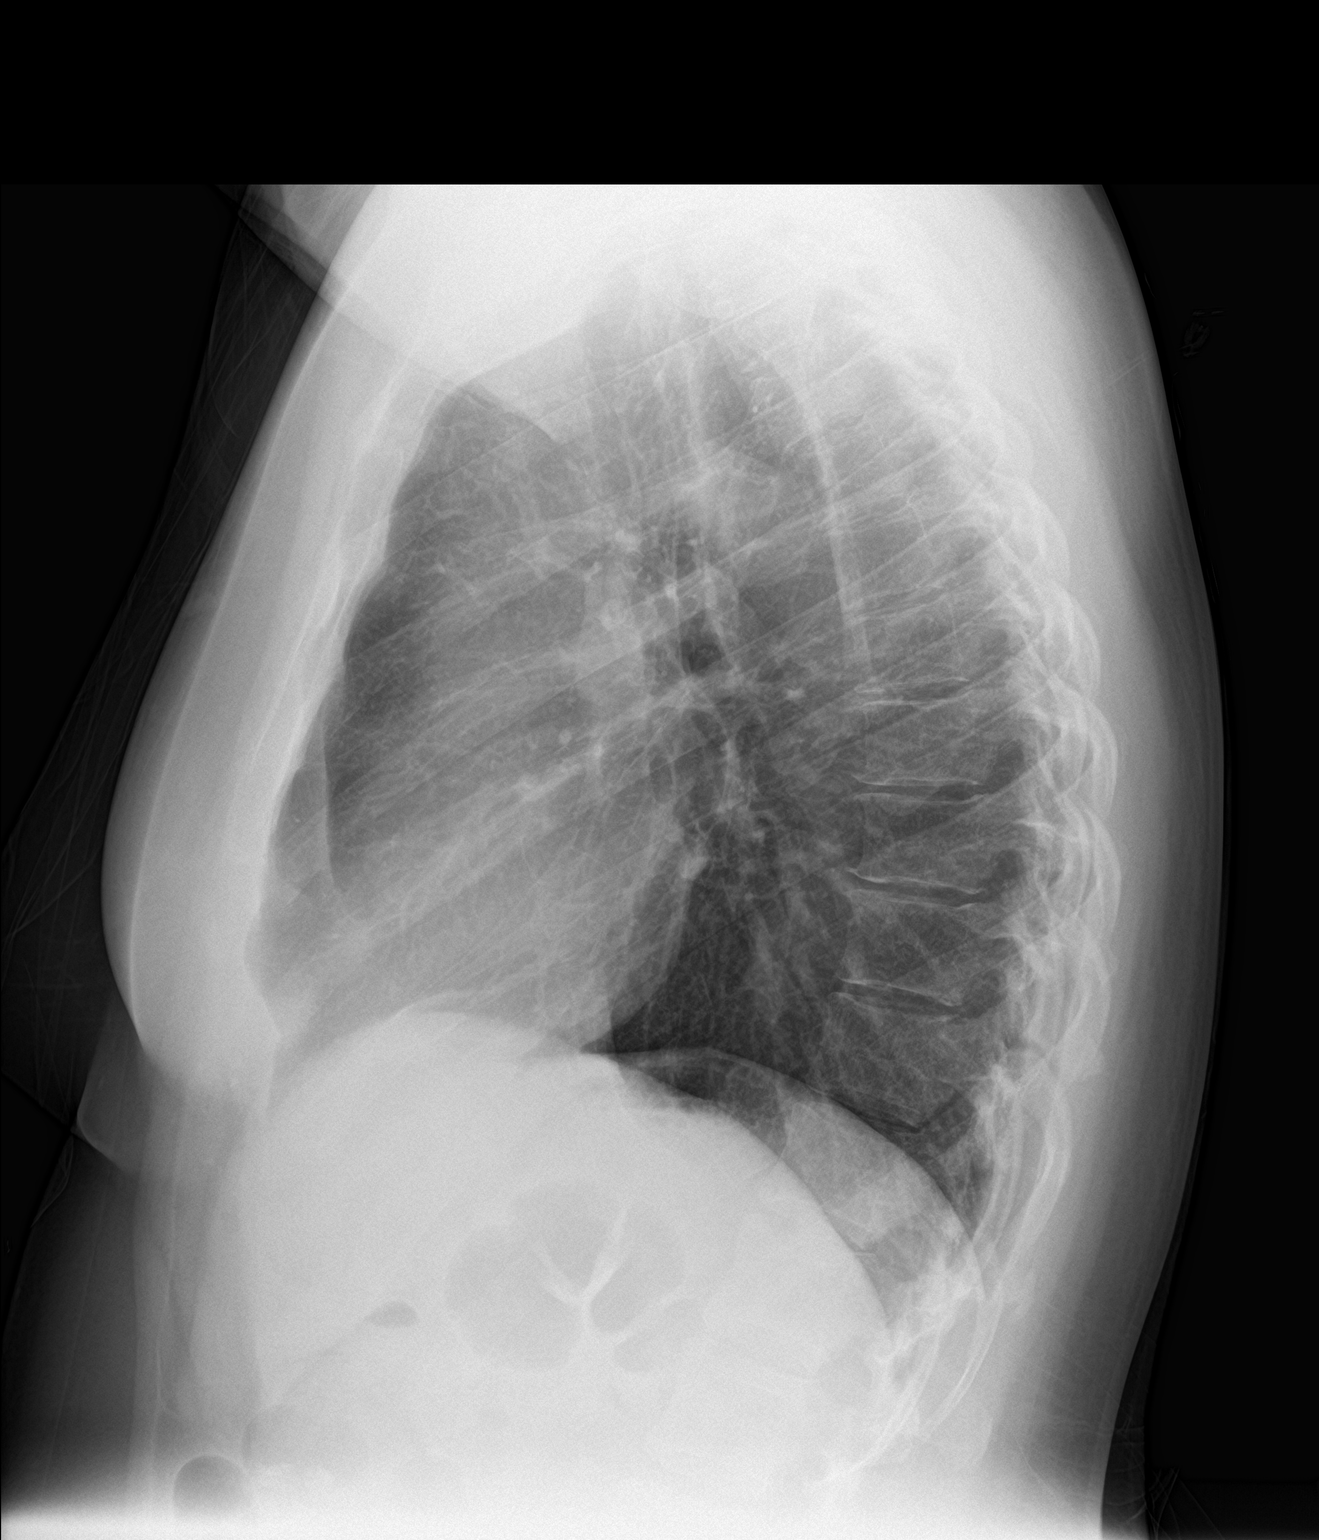

[2 of 2 positions shown; findings below may reference images not displayed]

FINDINGS: The heart size and mediastinal contours are within normal limits.
Both lungs are clear. The visualized skeletal structures are
unremarkable.
IMPRESSION: No active cardiopulmonary disease.

## 2021-09-11 ENCOUNTER — Encounter: Payer: Self-pay | Admitting: Internal Medicine

## 2021-09-19 NOTE — Progress Notes (Signed)
? ? ? ?Chief Complaint:  ? ?OBESITY ?Abigail Singh is here to discuss her progress with her obesity treatment plan along with follow-up of her obesity related diagnoses. Abigail Singh is on the Category 2 Plan with breakfast options and states she is following her eating plan approximately 85-90% of the time. Abigail Singh states she is walking for 15-30 minutes 4-7 times per week. ? ?Today's visit was #: 12 ?Starting weight: 209 lbs ?Starting date: 12/13/2020 ?Today's weight: 181 lbs ?Today's date: 08/31/2021 ?Total lbs lost to date: 51 ?Total lbs lost since last in-office visit: 4 ? ?Interim History: Abigail Singh lost 4 lbs since 07/03/2021. Dealing with husband's CKD, had cancer and now opposite kidney failing and patient is under a lot of stress now. Patient is disappointed that she didn't lose more weight and she is not understanding why.  ? ?Subjective:  ? ?1. Prediabetes ?Last A1c was 5.8, eight months ago. She is not on medications currently. ? ?2. Benign essential hypertension ?Taking Toprol XL and Lasix as needed. Denies dizziness or lightheadedness.  ? ?3. Vitamin D deficiency ?Last Vit D check was approximately 1 month ago and was at 54.79. ? ?4. B12 deficiency ?Last B12 check was within normal limits 8 months ago. Taking 500 mcg daily OTC. ? ?5. At risk for impaired metabolic function ?Abigail Singh is at increased risk for impaired metabolic function due to current nutrition and muscle mass. ? ?Assessment/Plan:  ?No orders of the defined types were placed in this encounter. ? ? ?Medications Discontinued During This Encounter  ?Medication Reason  ? predniSONE (DELTASONE) 10 MG tablet Completed Course  ?  ? ?No orders of the defined types were placed in this encounter. ?  ? ?1. Prediabetes ?I reiterated and again counseled patient on pathophysiology of the disease process of  Pre-DM.  ?Stressed importance of dietary and lifestyle modifications to result in weight loss as first line txmnt ?In addition, we discussed the risks and  benefits of various medication options which can help [place] in the management of this disease process as well as with weight loss.  Will consider starting one of these meds in future, or a dose adjustment in the future, as we will focus on prudent nutritional plan at this time.  ?Continue to decrease simple carbs; increase fiber and proteins -> follow meal plan  ?Handouts provided at pt's request after education provided.  All concerns/questions addressed.   ?Anticipatory guidance given.   ?- We will consider recheck A1c in approximately 3 months from last check, or as deemed appropriate.  ? ?2. Benign essential hypertension ?Blood pressure is at goal and patient is asymptomatic. Continue management per Cardiology, Dr. Harriet Masson.  ? ?3. Vitamin D deficiency ?Continue OTC Vitamin D 2,000 IU daily. At goal. She will follow-up for routine testing of Vitamin D, at least 2-3 times per year to avoid over-replacement. ? ?4. B12 deficiency ?The diagnosis was reviewed with the patient. Counseling provided today, see below. We will continue to monitor. Orders and follow up as documented in patient record. ? ?Counseling ?The body needs vitamin B12: to make red blood cells; to make DNA; and to help the nerves work properly so they can carry messages from the brain to the body.  ?The main causes of vitamin B12 deficiency include dietary deficiency, digestive diseases, pernicious anemia, and having a surgery in which part of the stomach or small intestine is removed.  ?Certain medicines can make it harder for the body to absorb vitamin B12. These medicines include: heartburn medications; some  antibiotics; some medications used to treat diabetes, gout, and high cholesterol.  ?In some cases, there are no symptoms of this condition. If the condition leads to anemia or nerve damage, various symptoms can occur, such as weakness or fatigue, shortness of breath, and numbness or tingling in your hands and feet.   ?Treatment:  ?May include taking  vitamin B12 supplements.  ?Avoid alcohol.  ?Eat lots of healthy foods that contain vitamin B12: ?Beef, pork, chicken, [place], and organ meats, such as liver.  ?Seafood: This includes clams, rainbow trout, salmon, tuna, and haddock. Eggs.  ?Cereal and dairy products that are fortified: This means that vitamin B12 has been added to the food.  ? ?5. At risk for impaired metabolic function ?Abigail Singh was given approximately 9 minutes of impaired  metabolic function prevention counseling today. We discussed intensive lifestyle modifications today with an emphasis on specific nutrition and exercise instructions and strategies.  ? ?Repetitive spaced learning was employed today to elicit superior memory formation and behavioral change. ? ?6. Obesity with current BMI of 31.1 ?Abigail Singh is currently in the action stage of change. As such, her goal is to continue with weight loss efforts. She has agreed to change to keeping a food journal and adhering to recommended goals of 1400-1500 calories and 100+ grams of protein daily.  ? ?Changed to journaling. Bring in log to next office visit. Consider rechecking A1c, B12, and Vit D in the near future.  ? ?Exercise goals: For substantial health benefits, adults should do at least 150 minutes (2 hours and 30 minutes) a week of moderate-intensity, or 75 minutes (1 hour and 15 minutes) a week of vigorous-intensity aerobic physical activity, or an equivalent combination of moderate- and vigorous-intensity aerobic activity. Aerobic activity should be performed in episodes of at least 10 minutes, and preferably, it should be spread throughout the week. ? ?Behavioral modification strategies: meal planning and cooking strategies, avoiding temptations, and planning for success. ? ?Abigail Singh has agreed to follow-up with our clinic in 3 weeks. She was informed of the importance of frequent follow-up visits to maximize her success with intensive lifestyle modifications for her multiple health  conditions.  ? ?Objective:  ? ?Blood pressure 104/63, pulse 71, temperature 98 ?F (36.7 ?C), height '5\' 4"'$  (1.626 m), weight 181 lb (82.1 kg), SpO2 99 %. ?Body mass index is 31.07 kg/m?. ? ?General: Cooperative, alert, well developed, in no acute distress. ?HEENT: Conjunctivae and lids unremarkable. ?Cardiovascular: Regular rhythm.  ?Lungs: Normal work of breathing. ?Neurologic: No focal deficits.  ? ?Lab Results  ?Component Value Date  ? CREATININE 0.81 07/13/2021  ? BUN 13 07/13/2021  ? NA 139 07/13/2021  ? K 4.4 07/13/2021  ? CL 104 07/13/2021  ? CO2 27 07/13/2021  ? ?Lab Results  ?Component Value Date  ? ALT 9 07/13/2021  ? AST 13 07/13/2021  ? ALKPHOS 100 07/13/2021  ? BILITOT 0.4 07/13/2021  ? ?Lab Results  ?Component Value Date  ? HGBA1C 5.6 04/03/2021  ? HGBA1C 5.8 (H) 12/13/2020  ? HGBA1C 5.6 12/05/2017  ? ?Lab Results  ?Component Value Date  ? INSULIN 15.2 04/03/2021  ? INSULIN 13.1 12/13/2020  ? ?Lab Results  ?Component Value Date  ? TSH 1.10 07/13/2021  ? ?Lab Results  ?Component Value Date  ? CHOL 161 07/13/2021  ? HDL 38.20 (L) 07/13/2021  ? [place] 97 07/13/2021  ? TRIG 129.0 07/13/2021  ? CHOLHDL 4 07/13/2021  ? ?Lab Results  ?Component Value Date  ? VD25OH 54.79 07/13/2021  ?  VD25OH 55.7 12/13/2020  ? VD25OH 53.78 07/11/2020  ? ?Lab Results  ?Component Value Date  ? WBC 6.6 07/13/2021  ? HGB 13.7 07/13/2021  ? HCT 41.4 07/13/2021  ? MCV 85.3 07/13/2021  ? PLT 292.0 07/13/2021  ? ?No results found for: IRON, TIBC, FERRITIN ? ?Attestation Statements:  ? ?Reviewed by clinician on day of visit: allergies, medications, problem list, medical history, surgical history, family history, social history, and previous encounter notes. ? ? ?I, Trixie Dredge, am acting as transcriptionist for [employer], DO. ? ?I have reviewed the above documentation for accuracy and completeness, and I agree with the above. Marjory Sneddon, D.O. ? ?The [place] was signed into law in 2016 which includes  the topic of electronic health records.  This provides immediate access to information in MyChart.  This includes consultation notes, operative notes, office notes, lab results and pathology reports.  If you

## 2021-09-20 ENCOUNTER — Ambulatory Visit (INDEPENDENT_AMBULATORY_CARE_PROVIDER_SITE_OTHER): Payer: BC Managed Care – PPO | Admitting: Psychology

## 2021-09-20 DIAGNOSIS — F4310 Post-traumatic stress disorder, unspecified: Secondary | ICD-10-CM

## 2021-09-21 NOTE — Progress Notes (Signed)
[place] Counselor/Therapist Progress Note  Patient ID: Abigail Singh, MRN: [id number],    Date: 09/20/2021  Time Spent: 60 minutes  Treatment Type: Individual Therapy  Reported Symptoms: Hx of flashbacks, worry, depression, history of hypervigilance.  Mental Status Exam: Appearance:  Casual     Behavior: Appropriate  Motor: Normal  Speech/Language:  Normal Rate  Affect: Blunt  Mood: normal  Thought process: normal  Thought content:   WNL  Sensory/Perceptual disturbances:   WNL  Orientation: oriented to person, place, time/date, and situation  Attention: Good  Concentration: Good  Memory: WNL  Fund of knowledge:  Good  Insight:   Good  Judgment:  Good  Impulse Control: Good   Risk Assessment: Danger to Self:  No Self-injurious Behavior: No Danger to Others: No Duty to Warn:no Physical Aggression / Violence:No  Access to Firearms a concern: No  Gang Involvement:No   Subjective: The patient attended a face-to-face individual therapy session in the office today.  The patient presents with a blunted affect and mood is pleasant.  The patient reports that she has had several stressors lately.  The patient presents with a blunted affect and does not seem to be emoting very much about this.  The patient reports that her husband seems to have stage IIIb kidney failure and she is very concerned about this because she has not been able to get an appointment with the nephrologist.  She also states that her father has been diagnosed with lymphoma and she is concerned about this.  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  We talked about using her tools that we have talked her already to deal with all the stressors and she is taking 1 step at a time.  Encouraged the patient to do the best she can to take care of herself and take some of the pressure off of herself in regard to trying to lose weight  at this point in time.  We will continue to provide support and encouragement.  Interventions: Cognitive Behavioral Therapy, Insight-Oriented, and Interpersonal  Diagnosis:PTSD (post-traumatic stress disorder)  Plan: Please see Treatment plan in Therapy Charts with target date of 05/22/2022.  The patient has approved this plan and is making progress.  Will see patient again in 4 weeks.  Keeli Roberg G Corlis Angelica, LCSW                  Ellisyn Icenhower G Adlyn Fife, LCSW               Taria Castrillo G Raneshia Derick, LCSW               Kadarius Cuffe G Elsye Mccollister, LCSW               Yamilee Harmes G Raaga Maeder, LCSW               Keithan Dileonardo G Cecia Egge, LCSW

## 2021-09-27 ENCOUNTER — Ambulatory Visit (INDEPENDENT_AMBULATORY_CARE_PROVIDER_SITE_OTHER): Payer: BC Managed Care – PPO | Admitting: Nurse Practitioner

## 2021-09-28 ENCOUNTER — Encounter (INDEPENDENT_AMBULATORY_CARE_PROVIDER_SITE_OTHER): Payer: Self-pay | Admitting: Adult Health

## 2021-09-28 ENCOUNTER — Ambulatory Visit (INDEPENDENT_AMBULATORY_CARE_PROVIDER_SITE_OTHER): Payer: BC Managed Care – PPO | Admitting: Adult Health

## 2021-09-28 VITALS — BP 94/64 | HR 85 | Temp 98.4°F | Ht 64.0 in | Wt 178.0 lb

## 2021-09-28 DIAGNOSIS — Z6830 Body mass index (BMI) 30.0-30.9, adult: Secondary | ICD-10-CM | POA: Diagnosis not present

## 2021-09-28 DIAGNOSIS — E669 Obesity, unspecified: Secondary | ICD-10-CM | POA: Diagnosis not present

## 2021-09-28 DIAGNOSIS — I10 Essential (primary) hypertension: Secondary | ICD-10-CM

## 2021-10-03 NOTE — Progress Notes (Unsigned)
Chief Complaint:   OBESITY Abigail Singh is here to discuss her progress with her obesity treatment plan along with follow-up of her obesity related diagnoses. Abigail Singh is on the Category 2 Plan and keeping a food journal and adhering to recommended goals of 1400-1500 calories and 100+ gms protein daily and states she is following her eating plan approximately 50% of the time. Abigail Singh states she is walking for 15 minutes 3 times per week.  Today's visit was #: 12 Starting weight: 209 lbs Starting date: 12/13/2020 Today's weight: 178 lbs Today's date: 09/28/21 Total lbs lost to date: 31 Total lbs lost since last in-office visit: -3  Interim History: Current life events 1.  Husband's chronic kidney disease worsening Abigail Singh -47).  . 2.  Father diagnosed with non-Hodgkin's lymphoma.  She prefers to follow category 2 the next few weeks.  She is Programmer, applications.  She has 3 daughters aged 52, 80, and 6. ***  Subjective:   1. Benign essential hypertension Dr. Tobb/cardiology manages hypertension. She is currently on Toprol-XL 50 mg a.m. and 25 mg p.m., she reports not taking evening 25 mg dose in last 2 weeks.  She has not been using Lasix 20 mg in over 4 weeks.  Assessment/Plan:   1. Benign essential hypertension Send my chart to Dr. Harriet Masson about heart rate drop in sleep ***? and stopping evening beta-blocker dose.  2. Obesity with current BMI of 30.7 Abigail Singh is currently in the action stage of change. As such, her goal is to continue with weight loss efforts. She has agreed to the Category 2 Plan.   Exercise goals: as is.  Behavioral modification strategies: increasing lean protein intake, decreasing simple carbohydrates, meal planning and cooking strategies, keeping healthy foods in the home, and planning for success.  Abigail Singh has agreed to follow-up with our clinic in 3 weeks. She was informed of the importance of frequent follow-up visits to maximize her success with  intensive lifestyle modifications for her multiple health conditions.   Objective:   Blood pressure 94/64, pulse 85, temperature 98.4 F (36.9 C), height '5\' 4"'$  (1.626 m), weight 178 lb (80.7 kg), SpO2 98 %. Body mass index is 30.55 kg/m.  General: Cooperative, alert, well developed, in no acute distress. HEENT: Conjunctivae and lids unremarkable. Cardiovascular: Regular rhythm.  Lungs: Normal work of breathing. Neurologic: No focal deficits.   Lab Results  Component Value Date   CREATININE 0.81 07/13/2021   BUN 13 07/13/2021   NA 139 07/13/2021   K 4.4 07/13/2021   CL 104 07/13/2021   CO2 27 07/13/2021   Lab Results  Component Value Date   ALT 9 07/13/2021   AST 13 07/13/2021   ALKPHOS 100 07/13/2021   BILITOT 0.4 07/13/2021   Lab Results  Component Value Date   HGBA1C 5.6 04/03/2021   HGBA1C 5.8 (H) 12/13/2020   HGBA1C 5.6 12/05/2017   Lab Results  Component Value Date   INSULIN 15.2 04/03/2021   INSULIN 13.1 12/13/2020   Lab Results  Component Value Date   TSH 1.10 07/13/2021   Lab Results  Component Value Date   CHOL 161 07/13/2021   HDL 38.20 (L) 07/13/2021   LDLCALC 97 07/13/2021   TRIG 129.0 07/13/2021   CHOLHDL 4 07/13/2021   Lab Results  Component Value Date   VD25OH 54.79 07/13/2021   VD25OH 55.7 12/13/2020   VD25OH 53.78 07/11/2020   Lab Results  Component Value Date   WBC 6.6 07/13/2021   HGB  13.7 07/13/2021   HCT 41.4 07/13/2021   MCV 85.3 07/13/2021   PLT 292.0 07/13/2021   No results found for: IRON, TIBC, FERRITIN   Attestation Statements:   Reviewed by clinician on day of visit: allergies, medications, problem list, medical history, surgical history, family history, social history, and previous encounter notes.  Time spent on visit including pre-visit chart review and post-visit care and charting was 28 minutes.   I, Georgianne Fick, FNP, am acting as Location manager for Mina Marble, NP.  I have reviewed the above  documentation for accuracy and completeness, and I agree with the above. -  ***

## 2021-10-07 ENCOUNTER — Other Ambulatory Visit: Payer: Self-pay | Admitting: Cardiology

## 2021-10-09 NOTE — Telephone Encounter (Signed)
Rx refill sent to pharmacy. 

## 2021-10-11 ENCOUNTER — Ambulatory Visit (AMBULATORY_SURGERY_CENTER): Payer: Self-pay

## 2021-10-11 VITALS — Ht 64.0 in | Wt 182.0 lb

## 2021-10-11 DIAGNOSIS — Z800 Family history of malignant neoplasm of digestive organs: Secondary | ICD-10-CM

## 2021-10-11 DIAGNOSIS — Z86010 Personal history of colonic polyps: Secondary | ICD-10-CM

## 2021-10-11 MED ORDER — NA SULFATE-K SULFATE-MG SULF 17.5-3.13-1.6 GM/177ML PO SOLN: 1.0000 | Freq: Once | ORAL | 0 refills | Status: AC

## 2021-10-11 NOTE — Progress Notes (Signed)
No egg or soy allergy known to patient  No issues known to pt with past sedation with any surgeries or procedures Patient denies ever being told they had issues or difficulty with intubation  No FH of Malignant Hyperthermia Pt is not on diet pills Pt is not on home 02  Pt is not on blood thinners  Pt denies issues with constipation at this time; No A fib or A flutter NO PA's for preps discussed with pt in PV today  Discussed with pt there will be an out-of-pocket cost for prep and that varies from $0 to 70 + dollars - pt verbalized understanding  Pt instructed to use [employer] or GoodRx for a price reduction on prep  Pt verified name, DOB, address and insurance during PV today.  Pt given instruction packet to read and not return.  Pt encouraged to call with questions or issues.  Pt has My chart, procedure instructions sent via My Chart

## 2021-10-18 ENCOUNTER — Encounter: Payer: Self-pay | Admitting: Family Medicine

## 2021-10-18 ENCOUNTER — Ambulatory Visit (INDEPENDENT_AMBULATORY_CARE_PROVIDER_SITE_OTHER): Payer: BC Managed Care – PPO | Admitting: Psychology

## 2021-10-18 ENCOUNTER — Other Ambulatory Visit: Payer: Self-pay | Admitting: Family Medicine

## 2021-10-18 DIAGNOSIS — F4310 Post-traumatic stress disorder, unspecified: Secondary | ICD-10-CM | POA: Diagnosis not present

## 2021-10-18 MED ORDER — AMPHETAMINE-DEXTROAMPHET ER 10 MG PO CP24: 20.0000 mg | ORAL_CAPSULE | Freq: Every day | ORAL | 0 refills | Status: DC

## 2021-10-18 NOTE — Telephone Encounter (Signed)
Pt aware her Rx was sent to pharmacy

## 2021-10-19 NOTE — Progress Notes (Signed)
Abigail Singh  Patient ID: Abigail Singh, MRN: [id number],    Date: 10/18/2021  Time Spent: 60 minutes  Treatment Type: Individual Therapy  Reported Symptoms: Hx of flashbacks, worry, depression, history of hypervigilance.  Mental Status Exam: Appearance:  Casual     Behavior: Appropriate  Motor: Normal  Speech/Language:  Normal Rate  Affect: Blunt  Mood: normal  Thought process: normal  Thought content:   WNL  Sensory/Perceptual disturbances:   WNL  Orientation: oriented to person, place, time/date, and situation  Attention: Good  Concentration: Good  Memory: WNL  Fund of knowledge:  Good  Insight:   Good  Judgment:  Good  Impulse Control: Good   Risk Assessment: Danger to Self:  No Self-injurious Behavior: No Danger to Others: No Duty to Warn:no Physical Aggression / Violence:No  Access to Firearms a concern: No  Gang Involvement:No   Subjective: The patient attended a face-to-face individual therapy session in the office today.  The patient presents with a blunted affect and mood is pleasant.  The patient and her family continue to have to deal with several different issues.  The patient's husband went to a specialist and apparently he is in stage III of renal disease and they discussed what they could do.  She seems quite blunted about the circumstance and it is possible that it is because she has been overwhelmed by so many things recently.  Her father does have lymphoma and she is concerned about him.  She also is worried about her daughters and their identity.  We talked about how she is taking care of herself and gave her positive feedback for handling the situation with her husband and her daughters.  Interventions: Cognitive Behavioral Therapy, Insight-Oriented, and Interpersonal  Diagnosis:PTSD (post-traumatic stress disorder)  Plan: Client Abilities/Strengths  insightful, motivated, intelligent  Client  Treatment Preferences  Outpatient Individual therapy and EMDR  Client Statement of Needs  "Dr. Cheryln Manly sent me to have EMDR for my trauma"  Treatment Level  Outpatient Individual therapy  Symptoms  Demonstrates an exaggerated startle response.: (Status: improved). Describes a reliving of the event, particularly through dissociative flashbacks.: (Status:  improved). Displays a significant decline in interest and engagement in activities.:  (Status: improved). Displays significant psychological and/or physiological distress resulting  from internal and external clues that are reminiscent of the traumatic event.:  (Status: improved). Experiences disturbing and persistent thoughts, images, and/or perceptions of the  traumatic event.: N(Status: improved). Has been exposed to a traumatic event  involving actual or perceived threat of death or serious injury.: (Status:  maintained). Impairment in social, occupational, or other areas of functioning.: (Status: improved). Intentionally avoids activities, places, people, or objects (e.g., up-armored vehicles) that evoke memories of the event.:  (Status: improved). Intentionally avoids  thoughts, feelings, or discussions related to the traumatic event.: (Status:  improved). Reports difficulty concentrating as well as feelings of guilt.:  (Status: improved). Reports hypervigilance.: N (Status: improved). Reports  response of intense fear, helplessness, or horror to the traumatic event.:  (Status: improved). Symptoms present more than one month.: (Status: improved).  Problems Addressed  Posttraumatic Stress Disorder (PTSD),  Goals 1. Eliminate or reduce the negative impact trauma related symptoms have  on social, occupational, and family functioning. Objective Participate in Eye Movement Desensitization and Reprocessing (EMDR) to reduce emotional distress  related to traumatic thoughts, feelings, and images. Target Date: 2022-05-22 Frequency:  Biweekly Progress: 90 Modality: individual Related Interventions 1. Utilize Eye Movement Desensitization and Reprocessing (EMDR) to  reduce the client's  emotional reactivity to the traumatic event and reduce PTSD symptoms. 2. No longer avoids persons, places, activities, and objects that are  reminiscent of the traumatic event. 3. No longer experiences intrusive event recollections, avoidance of event  reminders, intense arousal, or disinterest in activities or  relationships. 4. Returns to the level of psychological functioning prior to exposure to  the traumatic event. 5. Thinks about or openly discusses the traumatic event with others  without experiencing psychological or physiological distress. Diagnosis F43.10 (Posttraumatic stress disorder) - Open - [Signifier: n/a] Posttraumatic Stress  Disorder  Conditions For Discharge Achievement of treatment goals and objectives. The patient has approved this plan and is making progress.  Will see patient again in 4 weeks.  Cedrick Partain G Leinaala Catanese, LCSW                  Toleen Lachapelle G Kelisha Dall, LCSW               Kahli Mayon G Aylssa Herrig, LCSW               Zannie Locastro G Rinda Rollyson, LCSW               Labrandon Knoch G Alexsis Kathman, LCSW               Dantrell Schertzer G Hayzel Ruberg, LCSW               Madisynn Plair G Philip Eckersley, LCSW

## 2021-10-23 ENCOUNTER — Ambulatory Visit (INDEPENDENT_AMBULATORY_CARE_PROVIDER_SITE_OTHER): Payer: BC Managed Care – PPO | Admitting: Family Medicine

## 2021-10-23 ENCOUNTER — Encounter: Payer: Self-pay | Admitting: Cardiology

## 2021-10-23 ENCOUNTER — Encounter (INDEPENDENT_AMBULATORY_CARE_PROVIDER_SITE_OTHER): Payer: Self-pay | Admitting: Family Medicine

## 2021-10-23 VITALS — BP 122/73 | HR 82 | Temp 98.1°F | Ht 64.0 in | Wt 177.2 lb

## 2021-10-23 DIAGNOSIS — E669 Obesity, unspecified: Secondary | ICD-10-CM | POA: Diagnosis not present

## 2021-10-23 DIAGNOSIS — Z6830 Body mass index (BMI) 30.0-30.9, adult: Secondary | ICD-10-CM

## 2021-10-23 DIAGNOSIS — E786 Lipoprotein deficiency: Secondary | ICD-10-CM | POA: Diagnosis not present

## 2021-10-23 DIAGNOSIS — E559 Vitamin D deficiency, unspecified: Secondary | ICD-10-CM

## 2021-10-23 DIAGNOSIS — G4709 Other insomnia: Secondary | ICD-10-CM

## 2021-10-28 NOTE — Progress Notes (Signed)
Chief Complaint:   OBESITY Abigail Singh is here to discuss her progress with her obesity treatment plan along with follow-up of her obesity related diagnoses. Abigail Singh is on the Category 2 Plan and states she is following her eating plan approximately 50% of the time. Abigail Singh states she is walking 15 minutes 7 times per week.  Today's visit was #: 13 Starting weight: 209 lbs Starting date: 12/13/2020 Today's weight: 177 lbs Today's date: 10/23/2021 Total lbs lost to date: 32 Total lbs lost since last in-office visit: 1  Interim History: Abigail Singh started exercising with PT 2 days a week. She is more focused on her dad and husband's health. Pt has been drinking more water as well. School is out and she is not working this summer.  Subjective:   1. Vitamin D deficiency Discussed labs with patient today. Vitamin D level was at goal 3 months ago at 54.79. She is currently taking OTC vitamin D 2,000 IU each day. She denies nausea, vomiting or muscle weakness.  2. Low HDL (under 40) Discussed labs with patient today. Pt's HDL was low at 38 three months ago.  3. Other insomnia This is well controlled. Current treatment: Trazodone 50 mg qhs.  Assessment/Plan:  No orders of the defined types were placed in this encounter.   There are no discontinued medications.   No orders of the defined types were placed in this encounter.    1. Vitamin D deficiency Low Vitamin D level contributes to fatigue and are associated with obesity, breast, and colon cancer. She agrees to continue to take OTC Vitamin D 2,000 IU everyday and will follow-up for routine testing of Vitamin D, at least 2-3 times per year to avoid over-replacement.  2. Low HDL (under 40) Counseling done. Increase exercise.  3. Other insomnia Well controlled on 50 mg Trazodone. Recommend sleep hygiene measures including regular sleep schedule, optimal sleep environment, and relaxing pre-sleep rituals.   4. Obesity with  current BMI of 30.4 Abigail Singh is currently in the action stage of change. As such, her goal is to continue with weight loss efforts. She has agreed to the Category 2 Plan.   Goals: Be more consistent with water intake- 80+ oz per day Do strength training with bands on her own 3 days a week Walk everyday for 15-20 minutes  Exercise goals:  As is  Behavioral modification strategies: meal planning and cooking strategies and planning for success.  Abigail Singh has agreed to follow-up with our clinic in 4-5 weeks. She was informed of the importance of frequent follow-up visits to maximize her success with intensive lifestyle modifications for her multiple health conditions.   Objective:   Blood pressure 122/73, pulse 82, temperature 98.1 F (36.7 C), height 5\' 4"  (1.626 m), weight 177 lb 3.2 oz (80.4 kg), SpO2 97 %. Body mass index is 30.42 kg/m.  General: Cooperative, alert, well developed, in no acute distress. HEENT: Conjunctivae and lids unremarkable. Cardiovascular: Regular rhythm.  Lungs: Normal work of breathing. Neurologic: No focal deficits.   Lab Results  Component Value Date   CREATININE 0.81 07/13/2021   BUN 13 07/13/2021   NA 139 07/13/2021   K 4.4 07/13/2021   CL 104 07/13/2021   CO2 27 07/13/2021   Lab Results  Component Value Date   ALT 9 07/13/2021   AST 13 07/13/2021   ALKPHOS 100 07/13/2021   BILITOT 0.4 07/13/2021   Lab Results  Component Value Date   HGBA1C 5.6 04/03/2021   HGBA1C 5.8 (  H) 12/13/2020   HGBA1C 5.6 12/05/2017   Lab Results  Component Value Date   INSULIN 15.2 04/03/2021   INSULIN 13.1 12/13/2020   Lab Results  Component Value Date   TSH 1.10 07/13/2021   Lab Results  Component Value Date   CHOL 161 07/13/2021   HDL 38.20 (L) 07/13/2021   LDLCALC 97 07/13/2021   TRIG 129.0 07/13/2021   CHOLHDL 4 07/13/2021   Lab Results  Component Value Date   VD25OH 54.79 07/13/2021   VD25OH 55.7 12/13/2020   VD25OH 53.78 07/11/2020    Lab Results  Component Value Date   WBC 6.6 07/13/2021   HGB 13.7 07/13/2021   HCT 41.4 07/13/2021   MCV 85.3 07/13/2021   PLT 292.0 07/13/2021    Attestation Statements:   Reviewed by clinician on day of visit: allergies, medications, problem list, medical history, surgical history, family history, social history, and previous encounter notes.  I, Kyung Rudd, BS, CMA, am acting as transcriptionist for [employer], DO.  I have reviewed the above documentation for accuracy and completeness, and I agree with the above. -   I have reviewed the above documentation for accuracy and completeness, and I agree with the above. Carlye Grippe, D.O.  The 21st Century Cures Act was signed into law in 2016 which includes the topic of electronic health records.  This provides immediate access to information in MyChart.  This includes consultation notes, operative notes, office notes, lab results and pathology reports.  If you have any questions about what you read please let us know at your next visit so we can discuss your concerns and take corrective action if need be.  We are right here with you.

## 2021-11-01 ENCOUNTER — Encounter: Payer: Self-pay | Admitting: Internal Medicine

## 2021-11-04 ENCOUNTER — Other Ambulatory Visit: Payer: Self-pay | Admitting: Cardiology

## 2021-11-08 ENCOUNTER — Ambulatory Visit (AMBULATORY_SURGERY_CENTER): Payer: BC Managed Care – PPO | Admitting: Internal Medicine

## 2021-11-08 ENCOUNTER — Encounter: Payer: Self-pay | Admitting: Internal Medicine

## 2021-11-08 VITALS — BP 114/64 | HR 63 | Temp 96.6°F | Resp 12 | Ht 64.0 in | Wt 181.0 lb

## 2021-11-08 DIAGNOSIS — D123 Benign neoplasm of transverse colon: Secondary | ICD-10-CM

## 2021-11-08 DIAGNOSIS — Z800 Family history of malignant neoplasm of digestive organs: Secondary | ICD-10-CM

## 2021-11-08 DIAGNOSIS — Z86010 Personal history of colonic polyps: Secondary | ICD-10-CM | POA: Diagnosis not present

## 2021-11-08 DIAGNOSIS — K635 Polyp of colon: Secondary | ICD-10-CM | POA: Diagnosis not present

## 2021-11-08 DIAGNOSIS — Z09 Encounter for follow-up examination after completed treatment for conditions other than malignant neoplasm: Secondary | ICD-10-CM | POA: Diagnosis present

## 2021-11-08 MED ORDER — SODIUM CHLORIDE 0.9 % IV SOLN: 500.0000 mL | Freq: Once | INTRAVENOUS | Status: DC

## 2021-11-08 NOTE — Op Note (Signed)
[facility] Patient Name: Abigail Singh Procedure Date: 11/08/2021 8:58 AM MRN: [id number] Endoscopist: Docia Chuck. Henrene Pastor , MD Age: 46 Referring MD:  Date of Birth: [birth year] Gender: Female Account #: [id number] Procedure:                Colonoscopy with cold snare polypectomy x1 Indications:              High risk colon cancer surveillance: Personal                            history of non-advanced adenomas, High risk colon                            cancer surveillance: Personal history of multiple                            sessile serrated colon polyps (including one 10 mm                            or greater in size). Previous examinations 2007,                            2012, 2014, 2018 Medicines:                Monitored Anesthesia Care Procedure:                Pre-Anesthesia Assessment:                           - Prior to the procedure, a History and Physical                            was performed, and patient medications and                            allergies were reviewed. The patient's tolerance of                            previous anesthesia was also reviewed. The risks                            and benefits of the procedure and the sedation                            options and risks were discussed with the patient.                            All questions were answered, and informed consent                            was obtained. Prior Anticoagulants: The patient has                            taken no previous anticoagulant or antiplatelet  agents. ASA Grade Assessment: II - A patient with                            mild systemic disease. After reviewing the risks                            and benefits, the patient was deemed in                            satisfactory condition to undergo the procedure.                           After obtaining informed consent, the colonoscope                            was passed under  direct vision. Throughout the                            procedure, the patient's blood pressure, pulse, and                            oxygen saturations were monitored continuously. The                            CF [id number] #[id number] was introduced through the anus                            and advanced to the the cecum, identified by                            appendiceal orifice and ileocecal valve. The                            ileocecal valve, appendiceal orifice, and rectum                            were photographed. The quality of the bowel                            preparation was excellent. The colonoscopy was                            performed without difficulty. The patient tolerated                            the procedure well. The bowel preparation used was                            SUPREP via split dose instruction. Scope In: 9:07:58 AM Scope Out: 9:17:48 AM Scope Withdrawal Time: 0 hours 6 minutes 9 seconds  Total Procedure Duration: 0 hours 9 minutes 50 seconds  Findings:                 A 5 mm polyp was found in the  transverse colon. The                            polyp was sessile. The polyp was removed with a                            cold snare. Resection and retrieval were complete.                           Internal hemorrhoids were found during retroflexion.                           The exam was otherwise without abnormality on                            direct and retroflexion views. Previous marking                            tattoo noted. Complications:            No immediate complications. Estimated blood loss:                            None. Estimated Blood Loss:     Estimated blood loss: none. Impression:               - One 5 mm polyp in the transverse colon, removed                            with a cold snare. Resected and retrieved.                           - Internal hemorrhoids.                           - The examination was otherwise  normal on direct                            and retroflexion views. Recommendation:           - Repeat colonoscopy in 5 years for surveillance.                           - Patient has a contact number available for                            emergencies. The signs and symptoms of potential                            delayed complications were discussed with the                            patient. Return to normal activities tomorrow.                            Written discharge instructions were provided to the  patient.                           - Resume previous diet.                           - Continue present medications. Docia Chuck. Henrene Pastor, MD 11/08/2021 9:24:24 AM This report has been signed electronically.

## 2021-11-08 NOTE — Progress Notes (Signed)
Report to pacu rn. Vss. Care resumed by rn. 

## 2021-11-08 NOTE — Patient Instructions (Signed)
Please read handouts provided. Continue present medications. Await pathology results. Repeat colonoscopy in 5 years for screening.   YOU HAD AN ENDOSCOPIC PROCEDURE TODAY AT [facility]:   Refer to the procedure report that was given to you for any specific questions about what was found during the examination.  If the procedure report does not answer your questions, please call your gastroenterologist to clarify.  If you requested that your care partner not be given the details of your procedure findings, then the procedure report has been included in a sealed envelope for you to review at your convenience later.  YOU SHOULD EXPECT: Some feelings of bloating in the abdomen. Passage of more gas than usual.  Walking can help get rid of the air that was put into your GI tract during the procedure and reduce the bloating. If you had a lower endoscopy (such as a colonoscopy or flexible sigmoidoscopy) you may notice spotting of blood in your stool or on the toilet paper. If you underwent a bowel prep for your procedure, you may not have a normal bowel movement for a few days.  Please Note:  You might notice some irritation and congestion in your nose or some drainage.  This is from the oxygen used during your procedure.  There is no need for concern and it should clear up in a day or so.  SYMPTOMS TO REPORT IMMEDIATELY:  Following lower endoscopy (colonoscopy or flexible sigmoidoscopy):  Excessive amounts of blood in the stool  Significant tenderness or worsening of abdominal pains  Swelling of the abdomen that is new, acute  Fever of 100F or higher  For urgent or emergent issues, a gastroenterologist can be reached at any hour by calling [phone]. Do not use MyChart messaging for urgent concerns.    DIET:  We do recommend a small meal at first, but then you may proceed to your regular diet.  Drink plenty of fluids but you should avoid alcoholic beverages for 24  hours.  ACTIVITY:  You should plan to take it easy for the rest of today and you should NOT DRIVE or use heavy machinery until tomorrow (because of the sedation medicines used during the test).    FOLLOW UP: Our staff will call the number listed on your records the next business day following your procedure.  We will call around 7:15- 8:00 am to check on you and address any questions or concerns that you may have regarding the information given to you following your procedure. If we do not reach you, we will leave a message.  If you develop any symptoms (ie: fever, flu-like symptoms, shortness of breath, cough etc.) before then, please call [phone].  If you test positive for Covid 19 in the 2 weeks post procedure, please call and report this information to [place].    If any biopsies were taken you will be contacted by phone or by letter within the next 1-3 weeks.  Please call us at [phone] if you have not heard about the biopsies in 3 weeks.    SIGNATURES/CONFIDENTIALITY: You and/or your care partner have signed paperwork which will be entered into your electronic medical record.  These signatures attest to the fact that that the information above on your After Visit Summary has been reviewed and is understood.  Full responsibility of the confidentiality of this discharge information lies with you and/or your care-partner.

## 2021-11-08 NOTE — Progress Notes (Signed)
Called to room to assist during endoscopic procedure.  Patient ID and intended procedure confirmed with present staff. Received instructions for my participation in the procedure from the performing physician.  

## 2021-11-08 NOTE — Progress Notes (Signed)
HISTORY OF PRESENT ILLNESS:  Abigail Singh is a 46 y.o. female with a history of adenomatous colon polyps and multiple sessile serrated polyps including a greater than 10 mm lesion.  Family history of colon cancer.  Previous examinations 2007, 2012, 2014, 2018.  Now for routine follow-up.  REVIEW OF SYSTEMS:  All non-GI ROS negative.  Past Medical History:  Diagnosis Date   ADD (attention deficit disorder)    Allergy    sseasonal   Anemia    as teenager   Anxiety    Autonomic instability    Back pain    Chest pain    Chewing difficulty    Chronic fatigue    COVID-19 long hauler    Depression    Diastolic dysfunction 7353   Edema of both lower extremities    Fibroid    Fibromyalgia    Food allergy    GERD (gastroesophageal reflux disease)    Heartburn in pregnancy    History of fibromyalgia    Hypertension    IBS (irritable bowel syndrome)    Joint pain    Lactose intolerance    Migraines    Neuromuscular disorder (HCC)    fibromyalgia   Other hyperlipidemia    Pneumonia 05/2020   Postpartum care following cesarean delivery 03/17/2012   Preterm labor    SOB (shortness of breath)    Status post cesarean delivery (repeat x 2) 03/17/2012   Swallowing difficulty    Urinary tract infection    Vitamin D deficiency     Past Surgical History:  Procedure Laterality Date   abnormal cells in colon/tatoo area     2012   CESAREAN SECTION  2009   CESAREAN SECTION  03/17/2012   Procedure: CESAREAN SECTION;  Surgeon: Abigail Staff, MD;  Location: [facility];  Service: Obstetrics;  Laterality: N/A;  Repeat C/S.  EDD: 03/23/12   COLONOSCOPY     DILATION AND CURETTAGE OF UTERUS     POLYPECTOMY     tubes in ears     WISDOM TOOTH EXTRACTION      Social History Abigail Singh  reports that she quit smoking about 24 years ago. Her smoking use included cigarettes. She has a 11.00 pack-year smoking history. She has never used smokeless tobacco. She reports that she does  not drink alcohol and does not use drugs.  family history includes Allergic rhinitis in her daughter; Anxiety disorder in her daughter, daughter, and daughter; Asthma in her daughter; Colon cancer in her paternal grandmother; Colon cancer (age of onset: 28) in her father; Colon polyps in her maternal grandfather and maternal grandmother; Colon polyps (age of onset: 19) in her father; Depression in her mother; Diabetes in her father, maternal aunt, maternal grandfather, maternal grandmother, mother, and another family member; Eczema in her daughter; Food Allergy in her father; Heart attack in her father and another family member; Heart disease in her maternal grandfather and paternal grandfather; Heart failure in an other family member; High Cholesterol in her father and mother; Hyperlipidemia in her father and mother; Hypertension in her father; Lactose intolerance in her daughter, daughter, and daughter; Non-Hodgkin's lymphoma (age of onset: 79) in her father; OCD in her daughter; Stroke in an other family member.  Allergies  Allergen Reactions   Aspirin Anaphylaxis    Pt reports being able to take ibuprofen with no problems.   Citrus Hives and Swelling   Codeine Other (See Comments)    halucinations   Eggs Or Egg-Derived  Products Other (See Comments)    Patient states ok with flu shots, etc. No anaphalaxis.  Eggs give stomach cramps with diarrhea. After diarrhea ok.-pt eats baked goods that contains eggs with no issues    Erythromycin Rash   Lactose Intolerance (Gi) Diarrhea    Diarrhea with lactose.   Penicillins Rash   Sulfonamide Derivatives Rash    Childhood reaction, has taken recently with only stomach irritation.       PHYSICAL EXAMINATION: Vital signs: BP 133/81   Pulse 84   Temp (!) 96.6 F (35.9 C)   Resp 16   Ht '5\' 4"'$  (1.626 m)   Wt 181 lb (82.1 kg)   SpO2 100%   BMI 31.07 kg/m  General: Well-developed, well-nourished, no acute distress HEENT: Sclerae are anicteric,  conjunctiva pink. Oral mucosa intact Lungs: Clear Heart: Regular Abdomen: soft, nontender, nondistended, no obvious ascites, no peritoneal signs, normal bowel sounds. No organomegaly. Extremities: No edema Psychiatric: alert and oriented x3. Cooperative      ASSESSMENT:  1.  Personal history of adenomatous colon polyps and multiple as well as advanced sessile serrated polyps. 2.  Family history of colon cancer   PLAN:  Surveillance colonoscopy

## 2021-11-09 ENCOUNTER — Telehealth: Payer: Self-pay

## 2021-11-09 NOTE — Telephone Encounter (Signed)
  Follow up Call-     11/08/2021    7:59 AM  Call back number  Post procedure Call Back phone  # [phone]  Permission to leave phone message Yes     Patient questions:  Do you have a fever, pain , or abdominal swelling? No. Pain Score  0 *  Have you tolerated food without any problems? No.  Have you been able to return to your normal activities? Yes.    Do you have any questions about your discharge instructions: Diet   No. Medications  No. Follow up visit  No.  Do you have questions or concerns about your Care? No.  Actions: * If pain score is 4 or above: No action needed, pain <4.

## 2021-11-10 ENCOUNTER — Encounter: Payer: Self-pay | Admitting: Internal Medicine

## 2021-11-15 ENCOUNTER — Ambulatory Visit (INDEPENDENT_AMBULATORY_CARE_PROVIDER_SITE_OTHER): Payer: BC Managed Care – PPO | Admitting: Psychology

## 2021-11-15 DIAGNOSIS — F4310 Post-traumatic stress disorder, unspecified: Secondary | ICD-10-CM

## 2021-11-16 NOTE — Progress Notes (Signed)
[place] Counselor/Therapist Progress Note  Patient ID: DYONNA JASPERS, MRN: [id number],    Date: 11/15/2021  Time Spent: 60 minutes  Treatment Type: Individual Therapy  Reported Symptoms: Hx of flashbacks, worry, depression, history of hypervigilance.  Mental Status Exam: Appearance:  Casual     Behavior: Appropriate  Motor: Normal  Speech/Language:  Normal Rate  Affect: Blunt  Mood: normal  Thought process: normal  Thought content:   WNL  Sensory/Perceptual disturbances:   WNL  Orientation: oriented to person, place, time/date, and situation  Attention: Good  Concentration: Good  Memory: WNL  Fund of knowledge:  Good  Insight:   Good  Judgment:  Good  Impulse Control: Good   Risk Assessment: Danger to Self:  No Self-injurious Behavior: No Danger to Others: No Duty to Warn:no Physical Aggression / Violence:No  Access to Firearms a concern: No  Gang Involvement:No   Subjective: The patient attended a face-to-face individual therapy session in the office today.  The patient presents with a blunted affect and mood is pleasant.  The patient talked about several stressful things that are going on in her family's life.  The patient's father has cancer and is having chemotherapy now.  The patient also has some concerns about her husband and his kidney issues.  In addition we discussed her daughters and how she is handling the situation with them.  Provided supportive therapy and we discussed some problem solving.  Interventions: Cognitive Behavioral Therapy, Insight-Oriented, and Interpersonal  Diagnosis:PTSD (post-traumatic stress disorder)  Plan: Client Abilities/Strengths  insightful, motivated, intelligent  Client Treatment Preferences  Outpatient Individual therapy and EMDR  Client Statement of Needs  "Dr. Cheryln Manly sent me to have EMDR for my trauma"  Treatment Level  Outpatient Individual therapy  Symptoms  Demonstrates an exaggerated  startle response.: (Status: improved). Describes a reliving of the event, particularly through dissociative flashbacks.: (Status:  improved). Displays a significant decline in interest and engagement in activities.:  (Status: improved). Displays significant psychological and/or physiological distress resulting  from internal and external clues that are reminiscent of the traumatic event.:  (Status: improved). Experiences disturbing and persistent thoughts, images, and/or perceptions of the  traumatic event.: N(Status: improved). Has been exposed to a traumatic event  involving actual or perceived threat of death or serious injury.: (Status:  maintained). Impairment in social, occupational, or other areas of functioning.: (Status: improved). Intentionally avoids activities, places, people, or objects (e.g., up-armored vehicles) that evoke memories of the event.:  (Status: improved). Intentionally avoids  thoughts, feelings, or discussions related to the traumatic event.: (Status:  improved). Reports difficulty concentrating as well as feelings of guilt.:  (Status: improved). Reports hypervigilance.: N (Status: improved). Reports  response of intense fear, helplessness, or horror to the traumatic event.:  (Status: improved). Symptoms present more than one month.: (Status: improved).  Problems Addressed  Posttraumatic Stress Disorder (PTSD),  Goals 1. Eliminate or reduce the negative impact trauma related symptoms have  on social, occupational, and family functioning. Objective Participate in Eye Movement Desensitization and Reprocessing (EMDR) to reduce emotional distress  related to traumatic thoughts, feelings, and images. Target Date: 2022-05-22 Frequency: Biweekly Progress: 90 Modality: individual Related Interventions 1. Utilize Eye Movement Desensitization and Reprocessing (EMDR) to reduce the client's  emotional reactivity to the traumatic event and reduce PTSD symptoms. 2. No longer  avoids persons, places, activities, and objects that are  reminiscent of the traumatic event. 3. No longer experiences intrusive event recollections, avoidance of event  reminders, intense arousal, or disinterest  in activities or  relationships. 4. Returns to the level of psychological functioning prior to exposure to  the traumatic event. 5. Thinks about or openly discusses the traumatic event with others  without experiencing psychological or physiological distress. Diagnosis F43.10 (Posttraumatic stress disorder) - Open - [Signifier: n/a] Posttraumatic Stress  Disorder  Conditions For Discharge Achievement of treatment goals and objectives. The patient has approved this plan and is making progress.  Will see patient again in 4 weeks.  Pervis Macintyre G Maor Meckel, LCSW                  Matias Thurman G Maleah Rabago, LCSW               Lulubelle Simcoe G Kleigh Hoelzer, LCSW               Alayzia Pavlock G Verle Wheeling, LCSW               Tashanna Dolin G Veryl Winemiller, LCSW               Byan Poplaski G Malaya Cagley, LCSW               Dhrithi Riche G Beryle Bagsby, LCSW               Florice Hindle G Randie Tallarico, LCSW

## 2021-11-23 ENCOUNTER — Ambulatory Visit (INDEPENDENT_AMBULATORY_CARE_PROVIDER_SITE_OTHER): Payer: BC Managed Care – PPO | Admitting: Family Medicine

## 2021-11-23 ENCOUNTER — Encounter (INDEPENDENT_AMBULATORY_CARE_PROVIDER_SITE_OTHER): Payer: Self-pay | Admitting: Family Medicine

## 2021-11-23 VITALS — BP 116/76 | HR 112 | Temp 98.3°F | Ht 64.0 in | Wt 177.0 lb

## 2021-11-23 DIAGNOSIS — E559 Vitamin D deficiency, unspecified: Secondary | ICD-10-CM

## 2021-11-23 DIAGNOSIS — Z6830 Body mass index (BMI) 30.0-30.9, adult: Secondary | ICD-10-CM

## 2021-11-23 DIAGNOSIS — E669 Obesity, unspecified: Secondary | ICD-10-CM | POA: Diagnosis not present

## 2021-11-23 DIAGNOSIS — Z6835 Body mass index (BMI) 35.0-35.9, adult: Secondary | ICD-10-CM

## 2021-11-23 DIAGNOSIS — R7303 Prediabetes: Secondary | ICD-10-CM | POA: Diagnosis not present

## 2021-11-26 ENCOUNTER — Other Ambulatory Visit: Payer: Self-pay | Admitting: Cardiology

## 2021-11-27 NOTE — Progress Notes (Signed)
Chief Complaint:   OBESITY Abigail Singh is here to discuss her progress with her obesity treatment plan along with follow-up of her obesity related diagnoses. Abigail Singh is on the Category 2 Plan and states she is following her eating plan approximately 70% of the time. Abigail Singh states she is swimming and walking 30 minutes 5 times per week.  Today's visit was #: 14 Starting weight: 209 lbs Starting date: 12/13/2020 Today's weight: 177 lbs Today's date: 11/23/2021 Total lbs lost to date: 32 Total lbs lost since last in-office visit: 0  Interim History: Pt is doing better with drinking more water. She is happy she didn't gain over the holiday. She has been going over on snack calories and wishes to get back to being more strict with her intake. Pt hasn't used bands that were given to her at last OV.  Subjective:   1. Vitamin D deficiency She is currently taking OTC vitamin D 2,000 IU daily. She denies nausea, vomiting or muscle weakness.  2. Prediabetes Pt denies cravings or hunger when eating on plan.   Assessment/Plan:  No orders of the defined types were placed in this encounter.   There are no discontinued medications.   No orders of the defined types were placed in this encounter.    1. Vitamin D deficiency Vitamin D at goal at 54.8. Low Vitamin D level contributes to fatigue and are associated with obesity, breast, and colon cancer. She agrees to continue OTC Vitamin D 20,000 IU every week and will follow-up for routine testing of Vitamin D, at least 2-3 times per year to avoid over-replacement.   2. Prediabetes Abigail Singh will continue to work on weight loss, exercise, and decreasing simple carbohydrates to help decrease the risk of diabetes. Focus on healthier snack items and stay within snack calories. Journal intake as well as follow category 2. We will recheck REE via repeat IC at next OV to get updated energy expenditure and taylor meal plan appropriately.   3. Obesity,  Current BMI 30.4 [name] is currently in the action stage of change. As such, her goal is to continue with weight loss efforts. She has agreed to the Category 2 Plan with breakfast and lunch options.   Discussed with pt that she can follow category 2 and also journal intake because it helps with accountability and staying on track.  Exercise goals:  As is  Behavioral modification strategies: increasing lean protein intake, decreasing simple carbohydrates, and better snacking choices.  Abigail Singh has agreed to follow-up with our clinic in 3-4 weeks. She was informed of the importance of frequent follow-up visits to maximize her success with intensive lifestyle modifications for her multiple health conditions.   Objective:   Blood pressure 116/76, pulse (!) 112, temperature 98.3 F (36.8 C), height '5\' 4"'$  (1.626 m), weight 177 lb (80.3 kg), SpO2 100 %. Body mass index is 30.38 kg/m.  General: Cooperative, alert, well developed, in no acute distress. HEENT: Conjunctivae and lids unremarkable. Cardiovascular: Regular rhythm.  Lungs: Normal work of breathing. Neurologic: No focal deficits.   Lab Results  Component Value Date   CREATININE 0.81 07/13/2021   BUN 13 07/13/2021   NA 139 07/13/2021   K 4.4 07/13/2021   CL 104 07/13/2021   CO2 27 07/13/2021   Lab Results  Component Value Date   ALT 9 07/13/2021   AST 13 07/13/2021   ALKPHOS 100 07/13/2021   BILITOT 0.4 07/13/2021   Lab Results  Component Value Date   HGBA1C  5.6 04/03/2021   HGBA1C 5.8 (H) 12/13/2020   HGBA1C 5.6 12/05/2017   Lab Results  Component Value Date   INSULIN 15.2 04/03/2021   INSULIN 13.1 12/13/2020   Lab Results  Component Value Date   TSH 1.10 07/13/2021   Lab Results  Component Value Date   CHOL 161 07/13/2021   HDL 38.20 (L) 07/13/2021   LDLCALC 97 07/13/2021   TRIG 129.0 07/13/2021   CHOLHDL 4 07/13/2021   Lab Results  Component Value Date   VD25OH 54.79 07/13/2021   VD25OH 55.7  12/13/2020   VD25OH 53.78 07/11/2020   Lab Results  Component Value Date   WBC 6.6 07/13/2021   HGB 13.7 07/13/2021   HCT 41.4 07/13/2021   MCV 85.3 07/13/2021   PLT 292.0 07/13/2021    Attestation Statements:   Reviewed by clinician on day of visit: allergies, medications, problem list, medical history, surgical history, family history, social history, and previous encounter notes.  I, Kathlene November, BS, CMA, am acting as transcriptionist for [employer], DO.   I have reviewed the above documentation for accuracy and completeness, and I agree with the above. Marjory Sneddon, D.O.  The [place] was signed into law in 2016 which includes the topic of electronic health records.  This provides immediate access to information in MyChart.  This includes consultation notes, operative notes, office notes, lab results and pathology reports.  If you have any questions about what you read please let us know at your next visit so we can discuss your concerns and take corrective action if need be.  We are right here with you.

## 2021-12-03 ENCOUNTER — Other Ambulatory Visit: Payer: Self-pay | Admitting: Family Medicine

## 2021-12-04 MED ORDER — AMPHETAMINE-DEXTROAMPHET ER 10 MG PO CP24: 20.0000 mg | ORAL_CAPSULE | Freq: Every day | ORAL | 0 refills | Status: DC

## 2021-12-05 ENCOUNTER — Ambulatory Visit (INDEPENDENT_AMBULATORY_CARE_PROVIDER_SITE_OTHER): Payer: BC Managed Care – PPO | Admitting: Psychology

## 2021-12-05 DIAGNOSIS — F4310 Post-traumatic stress disorder, unspecified: Secondary | ICD-10-CM

## 2021-12-05 NOTE — Progress Notes (Signed)
[place] Counselor/Therapist Progress Note  Patient ID: BREONA CHERUBIN, MRN: [id number],    Date: 12/05/2021  Time Spent: 60 minutes  Treatment Type: Individual Therapy  Reported Symptoms: Hx of flashbacks, worry, depression, history of hypervigilance.  Mental Status Exam: Appearance:  Casual     Behavior: Appropriate  Motor: Normal  Speech/Language:  Normal Rate  Affect: Blunt  Mood: depressed  Thought process: normal  Thought content:   WNL  Sensory/Perceptual disturbances:   WNL  Orientation: oriented to person, place, time/date, and situation  Attention: Good  Concentration: Good  Memory: WNL  Fund of knowledge:  Good  Insight:   Good  Judgment:  Good  Impulse Control: Good   Risk Assessment: Danger to Self:  No Self-injurious Behavior: No Danger to Others: No Duty to Warn:no Physical Aggression / Violence:No  Access to Firearms a concern: No  Gang Involvement:No   Subjective: The patient attended a face-to-face individual therapy session in the office today.  The patient presents with a blunted affect and mood is depressed.  The patient reports that she has been going through the motions of life.  She has a lot of things that she is dealing with in her life.  Her father has cancer and she is helping with that situation.  Her husband also has the third stage renal disease and she struggles with that.  The thing that seems to be bothering her the most is that her daughter is struggling with her identity and she has not known how to handle that.  We processed some of the feelings that she was having and she became tearful and depressed during this conversation.  She did talk about the new therapist that her daughter has and it seems that this person has a good grasp on what the issues are and how to navigate the potential identity crisis as well as autism.  We scheduled another appointment in between her 4-week rotation as the patient feels that she needs  a little more support to be able to deal with this.  Interventions: Cognitive Behavioral Therapy, Insight-Oriented, and Interpersonal  Diagnosis:PTSD (post-traumatic stress disorder)  Plan: Client Abilities/Strengths  insightful, motivated, intelligent  Client Treatment Preferences  Outpatient Individual therapy and EMDR  Client Statement of Needs  "Dr. Cheryln Manly sent me to have EMDR for my trauma"  Treatment Level  Outpatient Individual therapy  Symptoms  Demonstrates an exaggerated startle response.: (Status: improved). Describes a reliving of the event, particularly through dissociative flashbacks.: (Status:  improved). Displays a significant decline in interest and engagement in activities.:  (Status: improved). Displays significant psychological and/or physiological distress resulting  from internal and external clues that are reminiscent of the traumatic event.:  (Status: improved). Experiences disturbing and persistent thoughts, images, and/or perceptions of the  traumatic event.: N(Status: improved). Has been exposed to a traumatic event  involving actual or perceived threat of death or serious injury.: (Status:  maintained). Impairment in social, occupational, or other areas of functioning.: (Status: improved). Intentionally avoids activities, places, people, or objects (e.g., up-armored vehicles) that evoke memories of the event.:  (Status: improved). Intentionally avoids  thoughts, feelings, or discussions related to the traumatic event.: (Status:  improved). Reports difficulty concentrating as well as feelings of guilt.:  (Status: improved). Reports hypervigilance.: N (Status: improved). Reports  response of intense fear, helplessness, or horror to the traumatic event.:  (Status: improved). Symptoms present more than one month.: (Status: improved).  Problems Addressed  Posttraumatic Stress Disorder (PTSD),  Goals 1. Eliminate  or reduce the negative impact trauma related  symptoms have  on social, occupational, and family functioning. Objective Participate in Eye Movement Desensitization and Reprocessing (EMDR) to reduce emotional distress  related to traumatic thoughts, feelings, and images. Target Date: 2022-05-22 Frequency: Biweekly Progress: 90 Modality: individual Related Interventions 1. Utilize Eye Movement Desensitization and Reprocessing (EMDR) to reduce the client's  emotional reactivity to the traumatic event and reduce PTSD symptoms. 2. No longer avoids persons, places, activities, and objects that are  reminiscent of the traumatic event. 3. No longer experiences intrusive event recollections, avoidance of event  reminders, intense arousal, or disinterest in activities or  relationships. 4. Returns to the level of psychological functioning prior to exposure to  the traumatic event. 5. Thinks about or openly discusses the traumatic event with others  without experiencing psychological or physiological distress. Diagnosis F43.10 (Posttraumatic stress disorder) - Open - [Signifier: n/a] Posttraumatic Stress  Disorder  Conditions For Discharge Achievement of treatment goals and objectives. The patient has approved this plan and is making progress.  Will see patient again in 4 weeks.  Safira Proffit G Narada Uzzle, LCSW                  Symphoni Helbling G Lyal Husted, LCSW               Felice Hope G Zuley Lutter, LCSW               Jayanna Kroeger G Clark Clowdus, LCSW               Tais Koestner G Trinton Prewitt, LCSW               Oluwademilade Mckiver G Niquita Digioia, LCSW               Caffie Sotto G Zavian Slowey, LCSW               Elvie Palomo G Trevon Strothers, LCSW               Kurtiss Wence G Trayquan Kolakowski, LCSW

## 2021-12-07 ENCOUNTER — Other Ambulatory Visit: Payer: Self-pay | Admitting: Cardiology

## 2021-12-08 ENCOUNTER — Other Ambulatory Visit: Payer: Self-pay | Admitting: Cardiology

## 2021-12-11 ENCOUNTER — Other Ambulatory Visit: Payer: Self-pay | Admitting: Family Medicine

## 2021-12-12 ENCOUNTER — Other Ambulatory Visit: Payer: Self-pay | Admitting: Cardiology

## 2021-12-13 ENCOUNTER — Ambulatory Visit: Payer: BC Managed Care – PPO | Admitting: Psychology

## 2021-12-13 ENCOUNTER — Encounter (INDEPENDENT_AMBULATORY_CARE_PROVIDER_SITE_OTHER): Payer: Self-pay

## 2021-12-19 ENCOUNTER — Ambulatory Visit (INDEPENDENT_AMBULATORY_CARE_PROVIDER_SITE_OTHER): Payer: BC Managed Care – PPO | Admitting: Psychology

## 2021-12-19 DIAGNOSIS — F4310 Post-traumatic stress disorder, unspecified: Secondary | ICD-10-CM

## 2021-12-19 NOTE — Progress Notes (Signed)
[place] Counselor/Therapist Progress Note  Patient ID: Abigail Singh, MRN: [id number],    Date: 12/19/2021  Time Spent: 60 minutes  Treatment Type: Individual Therapy  Reported Symptoms: Hx of flashbacks, worry, depression, history of hypervigilance.  Mental Status Exam: Appearance:  Casual     Behavior: Appropriate  Motor: Normal  Speech/Language:  Normal Rate  Affect: Blunt  Mood: depressed  Thought process: normal  Thought content:   WNL  Sensory/Perceptual disturbances:   WNL  Orientation: oriented to person, place, time/date, and situation  Attention: Good  Concentration: Good  Memory: WNL  Fund of knowledge:  Good  Insight:   Good  Judgment:  Good  Impulse Control: Good   Risk Assessment: Danger to Self:  No Self-injurious Behavior: No Danger to Others: No Duty to Warn:no Physical Aggression / Violence:No  Access to Firearms a concern: No  Gang Involvement:No   Subjective: The patient attended a face-to-face individual therapy session in the office today.  The patient presents with a blunted affect and mood is pleasant.  The patient reports that she is going back to school in the next couple of weeks to work.  She seems to be handling that situation relatively well.  The patient talked about going to the beach with her family and her father's illness and she became tearful when talking about her father's illness.  She did not talk today too much about her daughter who is having difficulty with her gender identity.  She states that she felt like she saw her daughter when they were at the beach because she was doing well and not under any pressure to be any different than she is.  We talked about the stressors that she is under and we talked about her caring for herself as she moves through the stressors.  We have an appointment in the beginning of September.  Interventions: Cognitive Behavioral Therapy, Insight-Oriented, and  Interpersonal  Diagnosis:PTSD (post-traumatic stress disorder)  Plan: Client Abilities/Strengths  insightful, motivated, intelligent  Client Treatment Preferences  Outpatient Individual therapy and EMDR  Client Statement of Needs  "Dr. Cheryln Manly sent me to have EMDR for my trauma"  Treatment Level  Outpatient Individual therapy  Symptoms  Demonstrates an exaggerated startle response.: (Status: improved). Describes a reliving of the event, particularly through dissociative flashbacks.: (Status:  improved). Displays a significant decline in interest and engagement in activities.:  (Status: improved). Displays significant psychological and/or physiological distress resulting  from internal and external clues that are reminiscent of the traumatic event.:  (Status: improved). Experiences disturbing and persistent thoughts, images, and/or perceptions of the  traumatic event.: N(Status: improved). Has been exposed to a traumatic event  involving actual or perceived threat of death or serious injury.: (Status:  maintained). Impairment in social, occupational, or other areas of functioning.: (Status: improved). Intentionally avoids activities, places, people, or objects (e.g., up-armored vehicles) that evoke memories of the event.:  (Status: improved). Intentionally avoids  thoughts, feelings, or discussions related to the traumatic event.: (Status:  improved). Reports difficulty concentrating as well as feelings of guilt.:  (Status: improved). Reports hypervigilance.: N (Status: improved). Reports  response of intense fear, helplessness, or horror to the traumatic event.:  (Status: improved). Symptoms present more than one month.: (Status: improved).  Problems Addressed  Posttraumatic Stress Disorder (PTSD),  Goals 1. Eliminate or reduce the negative impact trauma related symptoms have  on social, occupational, and family functioning. Objective Participate in Eye Movement Desensitization  and Reprocessing (EMDR) to reduce emotional distress  related to traumatic thoughts, feelings, and images. Target Date: 2022-05-22 Frequency: Biweekly Progress: 90 Modality: individual Related Interventions 1. Utilize Eye Movement Desensitization and Reprocessing (EMDR) to reduce the client's  emotional reactivity to the traumatic event and reduce PTSD symptoms. 2. No longer avoids persons, places, activities, and objects that are  reminiscent of the traumatic event. 3. No longer experiences intrusive event recollections, avoidance of event  reminders, intense arousal, or disinterest in activities or  relationships. 4. Returns to the level of psychological functioning prior to exposure to  the traumatic event. 5. Thinks about or openly discusses the traumatic event with others  without experiencing psychological or physiological distress. Diagnosis F43.10 (Posttraumatic stress disorder) - Open - [Signifier: n/a] Posttraumatic Stress  Disorder  Conditions For Discharge Achievement of treatment goals and objectives. The patient has approved this plan and is making progress.  Will see patient again in 4 weeks.  Kania Regnier G Priscila Bean, LCSW                  Phineas Mcenroe G Chase Knebel, LCSW               Latoyna Hird G Lilith Solana, LCSW               Aalijah Mims G Burley Kopka, LCSW               Audine Mangione G Kyrin Gratz, LCSW               Sheryl Towell G Xylia Scherger, LCSW               Tamasha Laplante G Sonya Pucci, LCSW               Raevin Wierenga G Claud Gowan, LCSW               Adilene Areola G Morayma Godown, LCSW               Lorenso Quirino G Katianne Barre, LCSW

## 2021-12-24 ENCOUNTER — Other Ambulatory Visit: Payer: Self-pay | Admitting: Internal Medicine

## 2021-12-28 ENCOUNTER — Encounter (INDEPENDENT_AMBULATORY_CARE_PROVIDER_SITE_OTHER): Payer: Self-pay | Admitting: Family Medicine

## 2021-12-28 ENCOUNTER — Ambulatory Visit (INDEPENDENT_AMBULATORY_CARE_PROVIDER_SITE_OTHER): Payer: BC Managed Care – PPO | Admitting: Family Medicine

## 2021-12-28 VITALS — BP 118/83 | HR 72 | Temp 98.7°F | Ht 64.0 in | Wt 176.0 lb

## 2021-12-28 DIAGNOSIS — E538 Deficiency of other specified B group vitamins: Secondary | ICD-10-CM

## 2021-12-28 DIAGNOSIS — E669 Obesity, unspecified: Secondary | ICD-10-CM

## 2021-12-28 DIAGNOSIS — R0602 Shortness of breath: Secondary | ICD-10-CM | POA: Diagnosis not present

## 2021-12-28 DIAGNOSIS — E786 Lipoprotein deficiency: Secondary | ICD-10-CM | POA: Diagnosis not present

## 2021-12-28 DIAGNOSIS — E559 Vitamin D deficiency, unspecified: Secondary | ICD-10-CM

## 2021-12-28 DIAGNOSIS — R7303 Prediabetes: Secondary | ICD-10-CM

## 2021-12-28 DIAGNOSIS — Z6835 Body mass index (BMI) 35.0-35.9, adult: Secondary | ICD-10-CM

## 2021-12-28 DIAGNOSIS — Z6830 Body mass index (BMI) 30.0-30.9, adult: Secondary | ICD-10-CM

## 2021-12-29 LAB — COMPREHENSIVE METABOLIC PANEL
ALT: 13 IU/L (ref 0–32)
AST: 13 IU/L (ref 0–40)
Albumin/Globulin Ratio: 1.7 (ref 1.2–2.2)
Albumin: 4.6 g/dL (ref 3.9–4.9)
Alkaline Phosphatase: 114 IU/L (ref 44–121)
BUN/Creatinine Ratio: 15 (ref 9–23)
BUN: 13 mg/dL (ref 6–24)
Bilirubin Total: 0.5 mg/dL (ref 0.0–1.2)
CO2: 22 mmol/L (ref 20–29)
Calcium: 9.8 mg/dL (ref 8.7–10.2)
Chloride: 102 mmol/L (ref 96–106)
Creatinine, Ser: 0.85 mg/dL (ref 0.57–1.00)
Globulin, Total: 2.7 g/dL (ref 1.5–4.5)
Glucose: 98 mg/dL (ref 70–99)
Potassium: 4.4 mmol/L (ref 3.5–5.2)
Sodium: 139 mmol/L (ref 134–144)
Total Protein: 7.3 g/dL (ref 6.0–8.5)
eGFR: 86 mL/min/{1.73_m2} (ref >59)

## 2021-12-29 LAB — HEMOGLOBIN A1C
Est. average glucose Bld gHb Est-mCnc: 117 mg/dL
Hgb A1c MFr Bld: 5.7 % — ABNORMAL HIGH (ref 4.8–5.6)

## 2021-12-29 LAB — INSULIN, RANDOM: INSULIN: 10.8 u[IU]/mL (ref 2.6–24.9)

## 2021-12-29 LAB — LIPID PANEL
Chol/HDL Ratio: 4.1 ratio (ref 0.0–4.4)
Cholesterol, Total: 161 mg/dL (ref 100–199)
HDL: 39 mg/dL — ABNORMAL LOW (ref >39)
LDL Chol Calc (NIH): 103 mg/dL — ABNORMAL HIGH (ref 0–99)
Triglycerides: 100 mg/dL (ref 0–149)
VLDL Cholesterol Cal: 19 mg/dL (ref 5–40)

## 2021-12-29 LAB — TSH: TSH: 1.65 u[IU]/mL (ref 0.450–4.500)

## 2021-12-29 LAB — VITAMIN B12: Vitamin B-12: 1178 pg/mL (ref 232–1245)

## 2021-12-29 LAB — T4, FREE: Free T4: 1.61 ng/dL (ref 0.82–1.77)

## 2021-12-29 LAB — VITAMIN D 25 HYDROXY (VIT D DEFICIENCY, FRACTURES): Vit D, 25-Hydroxy: 59 ng/mL (ref 30.0–100.0)

## 2022-01-01 ENCOUNTER — Other Ambulatory Visit: Payer: Self-pay | Admitting: Cardiology

## 2022-01-01 NOTE — Telephone Encounter (Signed)
Rx refill sent to pharmacy. 

## 2022-01-03 NOTE — Progress Notes (Unsigned)
Chief Complaint:   OBESITY Abigail Singh is here to discuss her progress with her obesity treatment plan along with follow-up of her obesity related diagnoses. Abigail Singh is on the Category 2 Plan with breakfast and lunch options and states she is following her eating plan approximately 50% of the time. Abigail Singh states she is walking 30 minutes 6 times per week.  Today's visit was #: 15 Starting weight: 209 lbs Starting date: 12/13/2020 Today's weight: 176 lbs Today's date: 12/28/2021 Total lbs lost to date: 33 Total lbs lost since last in-office visit: 1  Interim History: Abigail Singh's Dad is going through chemo she is getting the kids back to school.  Subjective:   1. Short of breath on exertion Pt has a history of COVID and is slowly improving with energy, but she is still very short of breath on exertion. Pt admits to low protein intake.  2. Low HDL (under 40) She has a history of low HDL, with 07/13/2021 level at 38.2  3. Prediabetes Abigail Singh has a diagnosis of prediabetes based on her elevated HgA1c and was informed this puts her at greater risk of developing diabetes. She continues to work on diet and exercise to decrease her risk of diabetes. She denies nausea or hypoglycemia. Medication: None  4. Vitamin D deficiency She is currently taking OTC vitamin D 2000 IU each day. She denies nausea, vomiting or muscle weakness.  5. B12 deficiency Pt is taking OTC B12 500 mcg daily.  Assessment/Plan:   Orders Placed This Encounter  Procedures   Vitamin B12   VITAMIN D 25 Hydroxy (Vit-D Deficiency, Fractures)   Lipid panel   Insulin, random   Hemoglobin A1c   Comprehensive metabolic panel   TSH   T4, free    There are no discontinued medications.   No orders of the defined types were placed in this encounter.    1. Short of breath on exertion Abigail Singh does feel that she gets out of breath more easily that she used to when she exercises. Abigail Singh's shortness of breath  appears to be obesity related and exercise induced. She has agreed to work on weight loss and gradually increase exercise to treat her exercise induced shortness of breath. Will continue to monitor closely. Recheck IC, TSH, Free T4. Pt's IC has gone from [place] to 1382, therefore, I discussed with pt to decrease intake and have 4-5 servings of fruits and vegetables and 7 servings of protein.  Lab/Orders today or future: - TSH - T4, free  2. Low HDL (under 40) Continue prudent nutritional plan and regular exercise.  Lab/Orders today or future: - Lipid panel  3. Prediabetes Abigail Singh will continue to work on weight loss, exercise, and decreasing simple carbohydrates to help decrease the risk of diabetes.   Lab/Orders today or future: - Insulin, random - Hemoglobin A1c - Comprehensive metabolic panel  4. Vitamin D deficiency Low Vitamin D level contributes to fatigue and are associated with obesity, breast, and colon cancer. She agrees to continue to take OTC Vitamin D 2,000 IU daily and will follow-up for routine testing of Vitamin D, at least 2-3 times per year to avoid over-replacement.  Lab/Orders today or future: - VITAMIN D 25 Hydroxy (Vit-D Deficiency, Fractures)  5. B12 deficiency The diagnosis was reviewed with the patient. Counseling provided today, see below. We will continue to monitor. Orders and follow up as documented in patient record. Continue current treatment plan.  Counseling The body needs vitamin B12: to make red blood cells;  to make DNA; and to help the nerves work properly so they can carry messages from the brain to the body.  The main causes of vitamin B12 deficiency include dietary deficiency, digestive diseases, pernicious anemia, and having a surgery in which part of the stomach or small intestine is removed.  Certain medicines can make it harder for the body to absorb vitamin B12. These medicines include: heartburn medications; some antibiotics; some  medications used to treat diabetes, gout, and high cholesterol.  In some cases, there are no symptoms of this condition. If the condition leads to anemia or nerve damage, various symptoms can occur, such as weakness or fatigue, shortness of breath, and numbness or tingling in your hands and feet.   Treatment:  May include taking vitamin B12 supplements.  Avoid alcohol.  Eat lots of healthy foods that contain vitamin B12: Beef, pork, chicken, [place], and organ meats, such as liver.  Seafood: This includes clams, rainbow trout, salmon, tuna, and haddock. Eggs.  Cereal and dairy products that are fortified: This means that vitamin B12 has been added to the food.   Lab/Orders today or future: - Vitamin B12  6. Obesity, Current BMI 30.2 Abigail Singh is currently in the action stage of change. As such, her goal is to continue with weight loss efforts. She has agreed to change to practicing portion control and making smarter food choices, such as increasing vegetables and decreasing simple carbohydrates.   Focus on mindful eating. Focus on getting 90 oz of water a day. Continue to exercise.  Exercise goals:  As is  Behavioral modification strategies: increasing lean protein intake, decreasing simple carbohydrates, and planning for success.  Abigail Singh has agreed to follow-up with our clinic in 4 weeks. She was informed of the importance of frequent follow-up visits to maximize her success with intensive lifestyle modifications for her multiple health conditions.   Abigail Singh was informed we would discuss her lab results at her next visit unless there is a critical issue that needs to be addressed sooner. Abigail Singh agreed to keep her next visit at the agreed upon time to discuss these results.  Objective:   Blood pressure 118/83, pulse 72, temperature 98.7 F (37.1 C), height '5\' 4"'$  (1.626 m), weight 176 lb (79.8 kg), SpO2 100 %. Body mass index is 30.21 kg/m.  General: Cooperative, alert, well  developed, in no acute distress. HEENT: Conjunctivae and lids unremarkable. Cardiovascular: Regular rhythm.  Lungs: Normal work of breathing. Neurologic: No focal deficits.   Lab Results  Component Value Date   CREATININE 0.85 12/28/2021   BUN 13 12/28/2021   NA 139 12/28/2021   K 4.4 12/28/2021   CL 102 12/28/2021   CO2 22 12/28/2021   Lab Results  Component Value Date   ALT 13 12/28/2021   AST 13 12/28/2021   ALKPHOS 114 12/28/2021   BILITOT 0.5 12/28/2021   Lab Results  Component Value Date   HGBA1C 5.7 (H) 12/28/2021   HGBA1C 5.6 04/03/2021   HGBA1C 5.8 (H) 12/13/2020   HGBA1C 5.6 12/05/2017   Lab Results  Component Value Date   INSULIN 10.8 12/28/2021   INSULIN 15.2 04/03/2021   INSULIN 13.1 12/13/2020   Lab Results  Component Value Date   TSH 1.650 12/28/2021   Lab Results  Component Value Date   CHOL 161 12/28/2021   HDL 39 (L) 12/28/2021   LDLCALC 103 (H) 12/28/2021   TRIG 100 12/28/2021   CHOLHDL 4.1 12/28/2021   Lab Results  Component Value Date  VD25OH 59.0 12/28/2021   VD25OH 54.79 07/13/2021   VD25OH 55.7 12/13/2020   Lab Results  Component Value Date   WBC 6.6 07/13/2021   HGB 13.7 07/13/2021   HCT 41.4 07/13/2021   MCV 85.3 07/13/2021   PLT 292.0 07/13/2021    Attestation Statements:   Reviewed by clinician on day of visit: allergies, medications, problem list, medical history, surgical history, family history, social history, and previous encounter notes.  I, Kathlene November, BS, CMA, am acting as transcriptionist for [employer], DO.   I have reviewed the above documentation for accuracy and completeness, and I agree with the above. Marjory Sneddon, D.O.  [place] was signed into law in 2016 which includes the topic of electronic health records.  This provides immediate access to information in MyChart.  This includes consultation notes, operative notes, office notes, lab results and pathology reports.   If you have any questions about what you read please let us know at your next visit so we can discuss your concerns and take corrective action if need be.  We are right here with you.

## 2022-01-07 ENCOUNTER — Other Ambulatory Visit: Payer: Self-pay | Admitting: Family Medicine

## 2022-01-09 NOTE — Telephone Encounter (Signed)
Patient is requesting a refill of the following medications: Requested Prescriptions   Pending Prescriptions Disp Refills   traZODone (DESYREL) 50 MG tablet [Pharmacy Med Name: TRAZODONE 50 MG TABLET] 90 tablet 2    Sig: TAKE 1/2 TO 1 TABLET BY MOUTH AT BEDTIME AS NEEDED FOR SLEEP    Date of patient request: 01/09/22 Last office visit: 07/18/21 Date of last refill: 04/11/21 Last refill amount: 90x2

## 2022-01-10 ENCOUNTER — Ambulatory Visit (INDEPENDENT_AMBULATORY_CARE_PROVIDER_SITE_OTHER): Payer: BC Managed Care – PPO | Admitting: Psychology

## 2022-01-10 DIAGNOSIS — F4310 Post-traumatic stress disorder, unspecified: Secondary | ICD-10-CM

## 2022-01-10 NOTE — Progress Notes (Signed)
[facility] Counselor/Therapist Progress Note  Patient ID: Abigail Singh, MRN: [id number],    Date: 01/10/2022  Time Spent: 60 minutes  Treatment Type: Individual Therapy  Reported Symptoms: Hx of flashbacks, worry, depression, history of hypervigilance.  Mental Status Exam: Appearance:  Casual     Behavior: Appropriate  Motor: Normal  Speech/Language:  Normal Rate  Affect: Blunt  Mood: depressed  Thought process: normal  Thought content:   WNL  Sensory/Perceptual disturbances:   WNL  Orientation: oriented to person, place, time/date, and situation  Attention: Good  Concentration: Good  Memory: WNL  Fund of knowledge:  Good  Insight:   Good  Judgment:  Good  Impulse Control: Good   Risk Assessment: Danger to Self:  No Self-injurious Behavior: No Danger to Others: No Duty to Warn:no Physical Aggression / Violence:No  Access to Firearms a concern: No  Gang Involvement:No   Subjective: The patient attended a face-to-face individual therapy session in the office today.  The patient presents with a blunted affect and mood is pleasant.  The patient reports that she is doing okay.  She still continues to focus on other people and taking care of others as opposed to herself.  We talked about the stressors that she has going on in her life.  Her father is involved in chemotherapy presently and this is stressful.  She reports that her daughter seem to be doing better and are back in school.  In addition she is concerned about her husband because he will not follow through with the doctors.  We talked about her needing to continue to do what she needs to do to take care of herself amidst all of the things that she does for others.  She seems to be managing things relatively well and has not had many issues with any of her previous trauma and quite some time.  Interventions: Cognitive Behavioral Therapy, Insight-Oriented, and Interpersonal  Diagnosis:PTSD  (post-traumatic stress disorder)  Plan: Client Abilities/Strengths  insightful, motivated, intelligent  Client Treatment Preferences  Outpatient Individual therapy and EMDR  Client Statement of Needs  "Dr. Cheryln Manly sent me to have EMDR for my trauma"  Treatment Level  Outpatient Individual therapy  Symptoms  Demonstrates an exaggerated startle response.: (Status: improved). Describes a reliving of the event, particularly through dissociative flashbacks.: (Status:  improved). Displays a significant decline in interest and engagement in activities.:  (Status: improved). Displays significant psychological and/or physiological distress resulting  from internal and external clues that are reminiscent of the traumatic event.:  (Status: improved). Experiences disturbing and persistent thoughts, images, and/or perceptions of the  traumatic event.: N(Status: improved). Has been exposed to a traumatic event  involving actual or perceived threat of death or serious injury.: (Status:  maintained). Impairment in social, occupational, or other areas of functioning.: (Status: improved). Intentionally avoids activities, places, people, or objects (e.g., up-armored vehicles) that evoke memories of the event.:  (Status: improved). Intentionally avoids  thoughts, feelings, or discussions related to the traumatic event.: (Status:  improved). Reports difficulty concentrating as well as feelings of guilt.:  (Status: improved). Reports hypervigilance.: N (Status: improved). Reports  response of intense fear, helplessness, or horror to the traumatic event.:  (Status: improved). Symptoms present more than one month.: (Status: improved).  Problems Addressed  Posttraumatic Stress Disorder (PTSD),  Goals 1. Eliminate or reduce the negative impact trauma related symptoms have  on social, occupational, and family functioning. Objective Participate in Eye Movement Desensitization and Reprocessing (EMDR) to reduce  emotional distress  related to traumatic thoughts, feelings, and images. Target Date: 2022-05-22 Frequency: Biweekly Progress: 90 Modality: individual Related Interventions 1. Utilize Eye Movement Desensitization and Reprocessing (EMDR) to reduce the client's  emotional reactivity to the traumatic event and reduce PTSD symptoms. 2. No longer avoids persons, places, activities, and objects that are  reminiscent of the traumatic event. 3. No longer experiences intrusive event recollections, avoidance of event  reminders, intense arousal, or disinterest in activities or  relationships. 4. Returns to the level of psychological functioning prior to exposure to  the traumatic event. 5. Thinks about or openly discusses the traumatic event with others  without experiencing psychological or physiological distress. Diagnosis F43.10 (Posttraumatic stress disorder) - Open - [Signifier: n/a] Posttraumatic Stress  Disorder  Conditions For Discharge Achievement of treatment goals and objectives. The patient has approved this plan and is making progress.  Will see patient again in 4 weeks.  Nelva Hauk G Aaleah Hirsch, LCSW                                                                                                           Metzli Pollick G Stephanye Finnicum, LCSW               Mikalyn Hermida G Emanuell Morina, LCSW               Shadrach Bartunek G Dawnell Bryant, LCSW

## 2022-01-16 ENCOUNTER — Other Ambulatory Visit: Payer: Self-pay | Admitting: Cardiology

## 2022-01-24 ENCOUNTER — Other Ambulatory Visit: Payer: Self-pay | Admitting: Family Medicine

## 2022-01-24 MED ORDER — AMPHETAMINE-DEXTROAMPHET ER 10 MG PO CP24: 20.0000 mg | ORAL_CAPSULE | Freq: Every day | ORAL | 0 refills | Status: DC

## 2022-01-25 ENCOUNTER — Ambulatory Visit: Payer: BC Managed Care – PPO | Admitting: Family Medicine

## 2022-01-25 ENCOUNTER — Encounter: Payer: Self-pay | Admitting: Family Medicine

## 2022-01-25 VITALS — BP 128/70 | HR 86 | Temp 98.1°F | Resp 17 | Ht 64.0 in | Wt 181.8 lb

## 2022-01-25 DIAGNOSIS — H66002 Acute suppurative otitis media without spontaneous rupture of ear drum, left ear: Secondary | ICD-10-CM | POA: Diagnosis not present

## 2022-01-25 DIAGNOSIS — J029 Acute pharyngitis, unspecified: Secondary | ICD-10-CM

## 2022-01-25 LAB — POC COVID19 BINAXNOW: SARS Coronavirus 2 Ag: NEGATIVE

## 2022-01-25 LAB — POCT RAPID STREP A (OFFICE): Rapid Strep A Screen: NEGATIVE

## 2022-01-25 MED ORDER — PANTOPRAZOLE SODIUM 40 MG PO TBEC: DELAYED_RELEASE_TABLET | ORAL | 1 refills | Status: DC

## 2022-01-25 MED ORDER — AZITHROMYCIN 250 MG PO TABS: ORAL_TABLET | ORAL | 0 refills | Status: DC

## 2022-01-25 NOTE — Patient Instructions (Signed)
Follow up as needed or as scheduled COVID and Strep are NEGATIVEPhebe Singh!!! START the Zpack as directed- 2 pills today and then 1 pill daily Drink LOTS of fluids REST! Alternate Tylenol/Ibuprofen as needed for headache, body aches Robitussin/Delsym as needed for cough Call with any questions or concerns Hang in there!

## 2022-01-25 NOTE — Progress Notes (Signed)
   Subjective:    Patient ID: Abigail Singh, female    DOB: 1975-11-18, 46 y.o.   MRN: [id number]  HPI Sore throat- sxs started yesterday when she woke w/ 'scratchy throat'.  Developed HA midday.  HA worsened until she went to bed early.  + sick contacts at home- strep.  + body aches, chills.  No fever.  Multiple sick contacts at work [employer]).  Denies sinus pain/pressure.  + fatigue.   Review of Systems For ROS see HPI     Objective:   Physical Exam Vitals reviewed.  Constitutional:      General: She is not in acute distress.    Appearance: She is well-developed. She is ill-appearing.  HENT:     Head: Normocephalic and atraumatic.     Right Ear: Tympanic membrane and ear canal normal. No middle ear effusion.     Left Ear: A middle ear effusion is present. Tympanic membrane is erythematous.     Nose: Congestion present.     Mouth/Throat:     Mouth: Mucous membranes are moist.     Pharynx: Posterior oropharyngeal erythema (copious PND) present. No oropharyngeal exudate.     Tonsils: No tonsillar exudate or tonsillar abscesses.  Eyes:     Conjunctiva/sclera: Conjunctivae normal.     Pupils: Pupils are equal, round, and reactive to light.  Cardiovascular:     Rate and Rhythm: Normal rate and regular rhythm.  Pulmonary:     Effort: Pulmonary effort is normal. No respiratory distress.     Breath sounds: Normal breath sounds. No wheezing or rhonchi.  Musculoskeletal:     Cervical back: Neck supple.  Lymphadenopathy:     Cervical: No cervical adenopathy.  Skin:    General: Skin is warm and dry.  Neurological:     General: No focal deficit present.     Mental Status: She is alert and oriented to person, place, and time.  Psychiatric:        Mood and Affect: Mood normal.        Behavior: Behavior normal.           Assessment & Plan:   L OM- new.  Pt's COVID test is negative but sxs just started yesterday so it might be too early to tell.  L ear is definitely infected  and due to PCN allergy will start [place].  Reviewed supportive care and red flags that should prompt return.  Pt expressed understanding and is in agreement w/ plan.

## 2022-01-29 ENCOUNTER — Encounter (INDEPENDENT_AMBULATORY_CARE_PROVIDER_SITE_OTHER): Payer: Self-pay | Admitting: Family Medicine

## 2022-01-29 ENCOUNTER — Ambulatory Visit (INDEPENDENT_AMBULATORY_CARE_PROVIDER_SITE_OTHER): Payer: BC Managed Care – PPO | Admitting: Family Medicine

## 2022-01-29 VITALS — BP 108/82 | HR 89 | Temp 97.7°F | Ht 64.0 in | Wt 176.6 lb

## 2022-01-29 DIAGNOSIS — R7303 Prediabetes: Secondary | ICD-10-CM | POA: Diagnosis not present

## 2022-01-29 DIAGNOSIS — E786 Lipoprotein deficiency: Secondary | ICD-10-CM

## 2022-01-29 DIAGNOSIS — Z6830 Body mass index (BMI) 30.0-30.9, adult: Secondary | ICD-10-CM

## 2022-01-29 DIAGNOSIS — E7849 Other hyperlipidemia: Secondary | ICD-10-CM | POA: Diagnosis not present

## 2022-01-29 DIAGNOSIS — E538 Deficiency of other specified B group vitamins: Secondary | ICD-10-CM | POA: Diagnosis not present

## 2022-01-29 DIAGNOSIS — E559 Vitamin D deficiency, unspecified: Secondary | ICD-10-CM

## 2022-01-29 DIAGNOSIS — E669 Obesity, unspecified: Secondary | ICD-10-CM

## 2022-02-02 ENCOUNTER — Other Ambulatory Visit: Payer: Self-pay | Admitting: Cardiology

## 2022-02-02 NOTE — Progress Notes (Unsigned)
Chief Complaint:   OBESITY Abigail Singh is here to discuss her progress with her obesity treatment plan along with follow-up of her obesity related diagnoses. Abigail Singh is on practicing portion control and making smarter food choices, such as increasing vegetables and decreasing simple carbohydrates and states she is following her eating plan approximately 60% of the time. Abigail Singh states she is walking 30 minutes 5 times per week.  Today's visit was #: 63 Starting weight: 209 lbs Starting date: 12/13/2020 Today's weight: 176 lbs Today's date: 01/29/2022 Total lbs lost to date: 33 Total lbs lost since last in-office visit: 0  Interim History: Abigail Singh is still struggling with eating her proteins. She is unable to meal prep due to her husband's low protein and her father's illness- she just can't get it in. We changed to PC/[place] at last OV, and she really likes it. Here to review labs.  Subjective:   1. Vitamin D deficiency Discussed labs with patient today. Pt is taking OTC Vit D 2000 IU daily.  2. B12 deficiency Discussed labs with patient today. She also takes OTC B12 500 mcg daily.  3. Other hyperlipidemia Discussed labs with patient today. Abigail Singh's LDL and HDL have improved. CMP is unremarkable and essentially within normal limits.  4. Prediabetes Discussed labs with patient today. Fasting insulin and A1c reviewed.  Assessment/Plan:   1. Vitamin D deficiency Vit D at goal at 73. Low Vitamin D level contributes to fatigue and are associated with obesity, breast, and colon cancer. She agrees to continue OTC Vitamin D 2,000 IU daily and will follow-up for routine testing of Vitamin D, at least 2-3 times per year to avoid over-replacement.  2. B12 deficiency B12 at goal at 1178. Pt with improved energy levels lately. The diagnosis was reviewed with the patient. Counseling provided today, see below. We will continue to monitor. Orders and follow up as documented in patient  record.  Counseling The body needs vitamin B12: to make red blood cells; to make DNA; and to help the nerves work properly so they can carry messages from the brain to the body.  The main causes of vitamin B12 deficiency include dietary deficiency, digestive diseases, pernicious anemia, and having a surgery in which part of the stomach or small intestine is removed.  Certain medicines can make it harder for the body to absorb vitamin B12. These medicines include: heartburn medications; some antibiotics; some medications used to treat diabetes, gout, and high cholesterol.  In some cases, there are no symptoms of this condition. If the condition leads to anemia or nerve damage, various symptoms can occur, such as weakness or fatigue, shortness of breath, and numbness or tingling in your hands and feet.   Treatment:  May include taking vitamin B12 supplements.  Avoid alcohol.  Eat lots of healthy foods that contain vitamin B12: Beef, pork, chicken, [place], and organ meats, such as liver.  Seafood: This includes clams, rainbow trout, salmon, tuna, and haddock. Eggs.  Cereal and dairy products that are fortified: This means that vitamin B12 has been added to the food.   3. Other hyperlipidemia Cardiovascular risk and specific lipid/LDL goals reviewed.  We discussed several lifestyle modifications today and Abigail Singh will continue to work on diet, exercise and weight loss efforts. Orders and follow up as documented in patient record.   Counseling Intensive lifestyle modifications are the first line treatment for this issue. Dietary changes: Increase soluble fiber. Decrease simple carbohydrates. Exercise changes: Moderate to vigorous-intensity aerobic activity 150 minutes  per week if tolerated. Lipid-lowering medications: see documented in medical record.  4. Prediabetes A1c is worse at 5.7 and fasting insulin improved to 10.8. Abigail Singh will continue to work on weight loss, exercise, and decreasing  simple carbohydrates to help decrease the risk of diabetes.   5. Obesity with current BMI of 30.3 Abigail Singh is currently in the action stage of change. As such, her goal is to continue with weight loss efforts. She has agreed to practicing portion control and making smarter food choices, such as increasing vegetables and decreasing simple carbohydrates.   Exercise goals:  As is  Behavioral modification strategies: planning for success.  Abigail Singh has agreed to follow-up with our clinic in 4 weeks. She was informed of the importance of frequent follow-up visits to maximize her success with intensive lifestyle modifications for her multiple health conditions.   Objective:   Blood pressure 108/82, pulse 89, temperature 97.7 F (36.5 C), temperature source Oral, height '5\' 4"'$  (1.626 m), weight 176 lb 9.6 oz (80.1 kg), SpO2 98 %. Body mass index is 30.31 kg/m.  General: Cooperative, alert, well developed, in no acute distress. HEENT: Conjunctivae and lids unremarkable. Cardiovascular: Regular rhythm.  Lungs: Normal work of breathing. Neurologic: No focal deficits.   Lab Results  Component Value Date   CREATININE 0.85 12/28/2021   BUN 13 12/28/2021   NA 139 12/28/2021   K 4.4 12/28/2021   CL 102 12/28/2021   CO2 22 12/28/2021   Lab Results  Component Value Date   ALT 13 12/28/2021   AST 13 12/28/2021   ALKPHOS 114 12/28/2021   BILITOT 0.5 12/28/2021   Lab Results  Component Value Date   HGBA1C 5.7 (H) 12/28/2021   HGBA1C 5.6 04/03/2021   HGBA1C 5.8 (H) 12/13/2020   HGBA1C 5.6 12/05/2017   Lab Results  Component Value Date   INSULIN 10.8 12/28/2021   INSULIN 15.2 04/03/2021   INSULIN 13.1 12/13/2020   Lab Results  Component Value Date   TSH 1.650 12/28/2021   Lab Results  Component Value Date   CHOL 161 12/28/2021   HDL 39 (L) 12/28/2021   LDLCALC 103 (H) 12/28/2021   TRIG 100 12/28/2021   CHOLHDL 4.1 12/28/2021   Lab Results  Component Value Date   VD25OH  59.0 12/28/2021   VD25OH 54.79 07/13/2021   VD25OH 55.7 12/13/2020   Lab Results  Component Value Date   WBC 6.6 07/13/2021   HGB 13.7 07/13/2021   HCT 41.4 07/13/2021   MCV 85.3 07/13/2021   PLT 292.0 07/13/2021    Attestation Statements:   Reviewed by clinician on day of visit: allergies, medications, problem list, medical history, surgical history, family history, social history, and previous encounter notes.  I, Kathlene November, BS, CMA, am acting as transcriptionist for [employer], DO.  I have reviewed the above documentation for accuracy and completeness, and I agree with the above. -  ***

## 2022-02-02 NOTE — Telephone Encounter (Signed)
Rx refill sent to pharmacy. 

## 2022-02-07 ENCOUNTER — Ambulatory Visit: Payer: BC Managed Care – PPO | Admitting: Psychology

## 2022-02-14 ENCOUNTER — Ambulatory Visit (INDEPENDENT_AMBULATORY_CARE_PROVIDER_SITE_OTHER): Payer: BC Managed Care – PPO | Admitting: Psychology

## 2022-02-14 DIAGNOSIS — F4310 Post-traumatic stress disorder, unspecified: Secondary | ICD-10-CM | POA: Diagnosis not present

## 2022-02-15 ENCOUNTER — Other Ambulatory Visit: Payer: Self-pay | Admitting: Cardiology
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
## 2022-02-22 ENCOUNTER — Encounter: Payer: Self-pay | Admitting: Cardiology

## 2022-02-22 ENCOUNTER — Ambulatory Visit: Payer: BC Managed Care – PPO | Attending: Cardiology | Admitting: Cardiology

## 2022-02-22 VITALS — BP 116/80 | HR 84 | Ht 66.5 in | Wt 182.4 lb

## 2022-02-22 DIAGNOSIS — I471 Supraventricular tachycardia, unspecified: Secondary | ICD-10-CM | POA: Diagnosis not present

## 2022-02-22 DIAGNOSIS — I10 Essential (primary) hypertension: Secondary | ICD-10-CM | POA: Diagnosis not present

## 2022-02-22 MED ORDER — METOPROLOL SUCCINATE ER 50 MG PO TB24: ORAL_TABLET | ORAL | 3 refills | Status: DC

## 2022-02-22 NOTE — Progress Notes (Signed)
Cardiology Office Note:    Date:  02/22/2022   ID:  Abigail Singh, DOB [birth year], MRN [id number]  PCP:  Midge Minium, MD  Cardiologist:  Berniece Salines, DO  Electrophysiologist:  None   Referring MD: Midge Minium, MD    History of Present Illness:    Abigail Singh is a 46 y.o. female with a hx of COVID-19 infection and long COVID syndrome with autonomic dysfunction recently started on metoprolol due to persistent tachycardia, obesity, fibromyalgia, migraine headaches with vertigo, familial hyperlipidemia.   I saw the patient on 09/27/2020 at that time she was experiencing palpitations. She had been started on beta blockers by her pcp. During her last visit we increased her toprol xl to 50 mg daily.  An echo was also ordered.   At her visit on November 11, 2020 we adjusted her beta-blocker.  Since that time she had stopped the nighttime beta-blocker and only takes the Toprol 50 mg daily.  She shared with me she has had a rough time her husband was diagnosed with kidney cancer but he is recovering he has had his surgery-her father was also diagnosed with cancer.  She is pulling through.  He is grateful for help she tells me.   Past Medical History:  Diagnosis Date   ADD (attention deficit disorder)    Allergy    sseasonal   Anemia    as teenager   Anxiety    Autonomic instability    Back pain    Chest pain    Chewing difficulty    Chronic fatigue    COVID-19 long hauler    Depression    Diastolic dysfunction 4010   Edema of both lower extremities    Fibroid    Fibromyalgia    Food allergy    GERD (gastroesophageal reflux disease)    Heartburn in pregnancy    History of fibromyalgia    Hypertension    IBS (irritable bowel syndrome)    Joint pain    Lactose intolerance    Migraines    Neuromuscular disorder (HCC)    fibromyalgia   Other hyperlipidemia    Pneumonia 05/2020   Postpartum care following cesarean delivery 03/17/2012   Preterm labor    SOB  (shortness of breath)    Status post cesarean delivery (repeat x 2) 03/17/2012   Swallowing difficulty    Urinary tract infection    Vitamin D deficiency     Past Surgical History:  Procedure Laterality Date   abnormal cells in colon/tatoo area     2012   CESAREAN SECTION  2009   CESAREAN SECTION  03/17/2012   Procedure: CESAREAN SECTION;  Surgeon: Marvene Staff, MD;  Location: [facility];  Service: Obstetrics;  Laterality: N/A;  Repeat C/S.  EDD: 03/23/12   COLONOSCOPY     DILATION AND CURETTAGE OF UTERUS     POLYPECTOMY     tubes in ears     WISDOM TOOTH EXTRACTION      Current Medications: Current Meds  Medication Sig   acetaminophen (TYLENOL) 325 MG tablet Take 650 mg by mouth every 6 (six) hours as needed for moderate pain.   albuterol (VENTOLIN HFA) 108 (90 Base) MCG/ACT inhaler Inhale 1 puff into the lungs as needed for wheezing or shortness of breath.   amphetamine-dextroamphetamine (ADDERALL XR) 10 MG 24 hr capsule Take 2 capsules (20 mg total) by mouth daily.   Ascorbic Acid (VITAMIN C) 1000 MG tablet Take 1,000 mg by  mouth daily.   azelastine (ASTELIN) 0.1 % nasal spray Place 2 sprays into both nostrils 2 (two) times daily.   azithromycin (ZITHROMAX) 250 MG tablet 2 tabs on day 1, 1 tab on day 2-5   BIOTIN PO Take 200 mg by mouth daily.   Budesonide-Formoterol Fumarate (SYMBICORT IN) Inhale 1 puff into the lungs as needed (shortness of breath).   Calcium Carbonate (CALCIUM 500 PO) Take 500 mg by mouth daily.   chlorpheniramine (CHLOR-TRIMETON) 4 MG tablet Take 4 mg by mouth every 4 (four) hours as needed. 2AT BEDTIME   Cholecalciferol (VITAMIN D) 50 MCG (2000 UT) CAPS Take 2,000 Units by mouth daily.   EPINEPHrine 0.3 mg/0.3 mL IJ SOAJ injection Inject 0.3 mLs (0.3 mg total) into the muscle as needed for anaphylaxis.   furosemide (LASIX) 20 MG tablet TAKE 1 TABLET (20 MG TOTAL) BY MOUTH AS NEEDED (BILATERAL LEG SWELLING).   ibuprofen (ADVIL) 200 MG tablet Take 400  mg by mouth as needed for moderate pain.   medroxyPROGESTERone (DEPO-PROVERA) 150 MG/ML injection SMARTSIG:1 Milliliter(s) IM Every 12 Weeks   MULTIPLE VITAMIN PO Take 1 capsule by mouth daily.   pantoprazole (PROTONIX) 40 MG tablet TAKE 1 TABLET DAILY 30-60 MIN BEFORE 1ST MEAL OF THE DAY   potassium chloride (KLOR-CON) 10 MEQ tablet TAKE 1 TABLET (10 MEQ TOTAL) BY MOUTH DAILY AS NEEDED (FLUID, SWELLING).   Probiotic Product (PROBIOTIC-10 PO) Take by mouth.   promethazine-dextromethorphan (PROMETHAZINE-DM) 6.25-15 MG/5ML syrup Take 5 mLs by mouth 4 (four) times daily as needed for cough.   traZODone (DESYREL) 50 MG tablet TAKE 1/2 TO 1 TABLET BY MOUTH AT BEDTIME AS NEEDED FOR SLEEP   triamcinolone (NASACORT) 55 MCG/ACT AERO nasal inhaler Place 2 sprays into the nose daily.   vitamin B-12 (CYANOCOBALAMIN) 500 MCG tablet Take 500 mcg by mouth daily.   Zinc 30 MG TABS Take 30 mg by mouth daily.   [DISCONTINUED] metoprolol succinate (TOPROL-XL) 50 MG 24 hr tablet TAKE 1 TABLET BY MOUTH EVERY DAY WITH OR IMMEDIATELY FOLLOWING A MEAL     Allergies:   Aspirin, Citrus, Codeine, Eggs or egg-derived products, Erythromycin, Lactose intolerance (gi), Penicillins, and Sulfonamide derivatives   Social History   Socioeconomic History   Marital status: Married    Spouse name: Not on file   Number of children: Not on file   Years of education: Not on file   Highest education level: Not on file  Occupational History   Not on file  Tobacco Use   Smoking status: Former    Packs/day: 1.00    Years: 11.00    Total pack years: 11.00    Types: Cigarettes    Quit date: 05/07/1997    Years since quitting: 24.8   Smokeless tobacco: Never   Tobacco comments:    no E cigs  Vaping Use   Vaping Use: Never used  Substance and Sexual Activity   Alcohol use: No    Alcohol/week: 0.0 standard drinks of alcohol   Drug use: No   Sexual activity: Yes  Other Topics Concern   Not on file  Social History Narrative    Not on file   Social Determinants of Health   Financial Resource Strain: Not on file  Food Insecurity: Not on file  Transportation Needs: Not on file  Physical Activity: Not on file  Stress: Not on file  Social Connections: Not on file     Family History: The patient's family history includes Allergic rhinitis in her  daughter; Anxiety disorder in her daughter, daughter, and daughter; Asthma in her daughter; Colon cancer in her paternal grandmother; Colon cancer (age of onset: 63) in her father; Colon polyps in her maternal grandfather and maternal grandmother; Colon polyps (age of onset: 78) in her father; Depression in her mother; Diabetes in her father, maternal aunt, maternal grandfather, maternal grandmother, mother, and another family member; Eczema in her daughter; Food Allergy in her father; Heart attack in her father and another family member; Heart disease in her maternal grandfather and paternal grandfather; Heart failure in an other family member; High Cholesterol in her father and mother; Hyperlipidemia in her father and mother; Hypertension in her father; Lactose intolerance in her daughter, daughter, and daughter; Non-Hodgkin's lymphoma (age of onset: 84) in her father; OCD in her daughter; Stroke in an other family member. There is no history of Angioedema, Immunodeficiency, Esophageal cancer, Stomach cancer, or Rectal cancer.  ROS:   Review of Systems  Constitution: Negative for decreased appetite, fever and weight gain.  HENT: Negative for congestion, ear discharge, hoarse voice and sore throat.   Eyes: Negative for discharge, redness, vision loss in right eye and visual halos.  Cardiovascular: Negative for chest pain, dyspnea on exertion, leg swelling, orthopnea and palpitations.  Respiratory: Negative for cough, hemoptysis, shortness of breath and snoring.   Endocrine: Negative for heat intolerance and polyphagia.  Hematologic/Lymphatic: Negative for bleeding problem.  Does not bruise/bleed easily.  Skin: Negative for flushing, nail changes, rash and suspicious lesions.  Musculoskeletal: Negative for arthritis, joint pain, muscle cramps, myalgias, neck pain and stiffness.  Gastrointestinal: Negative for abdominal pain, bowel incontinence, diarrhea and excessive appetite.  Genitourinary: Negative for decreased libido, genital sores and incomplete emptying.  Neurological: Negative for brief paralysis, focal weakness, headaches and loss of balance.  Psychiatric/Behavioral: Negative for altered mental status, depression and suicidal ideas.  Allergic/Immunologic: Negative for HIV exposure and persistent infections.    EKGs/Labs/Other Studies Reviewed:    The following studies were reviewed today:   EKG:  The ekg ordered today demonstrates sinus rhythm, heart rate 84 bpm.  Recent Labs: 07/13/2021: Hemoglobin 13.7; Platelets 292.0 12/28/2021: ALT 13; BUN 13; Creatinine, Ser 0.85; Potassium 4.4; Sodium 139; TSH 1.650  Recent Lipid Panel    Component Value Date/Time   CHOL 161 12/28/2021 0810   TRIG 100 12/28/2021 0810   HDL 39 (L) 12/28/2021 0810   CHOLHDL 4.1 12/28/2021 0810   CHOLHDL 4 07/13/2021 0811   VLDL 25.8 07/13/2021 0811   LDLCALC 103 (H) 12/28/2021 0810    Physical Exam:    VS:  BP 116/80   Pulse 84   Ht 5' 6.5" (1.689 m)   Wt 182 lb 6.4 oz (82.7 kg)   SpO2 95%   BMI 29.00 kg/m     Wt Readings from Last 3 Encounters:  02/22/22 182 lb 6.4 oz (82.7 kg)  01/29/22 176 lb 9.6 oz (80.1 kg)  01/25/22 181 lb 12.8 oz (82.5 kg)     GEN: Well nourished, well developed in no acute distress HEENT: Normal NECK: No JVD; No carotid bruits LYMPHATICS: No lymphadenopathy CARDIAC: S1S2 noted,RRR, no murmurs, rubs, gallops RESPIRATORY:  Clear to auscultation without rales, wheezing or rhonchi  ABDOMEN: Soft, non-tender, non-distended, +bowel sounds, no guarding. EXTREMITIES: No edema, No cyanosis, no clubbing MUSCULOSKELETAL:  No deformity   SKIN: Warm and dry NEUROLOGIC:  Alert and oriented x 3, non-focal PSYCHIATRIC:  Normal affect, good insight  ASSESSMENT:    1. PSVT (paroxysmal supraventricular tachycardia)  2. Palpitations    PLAN:     1.  No medication changes today. 2 the patient understands the need to lose weight with diet and exercise. We have discussed specific strategies for this.  The patient is in agreement with the above plan. The patient left the office in stable condition.  The patient will follow up in   Medication Adjustments/Labs and Tests Ordered: Current medicines are reviewed at length with the patient today.  Concerns regarding medicines are outlined above.  Orders Placed This Encounter  Procedures   EKG 12-Lead   Meds ordered this encounter  Medications   metoprolol succinate (TOPROL-XL) 50 MG 24 hr tablet    Sig: TAKE 1 TABLET BY MOUTH EVERY DAY WITH OR IMMEDIATELY FOLLOWING A MEAL    Dispense:  90 tablet    Refill:  3    Appointment needed for future refills    Patient Instructions  Medication Instructions:  Your physician recommends that you continue on your current medications as directed. Please refer to the Current Medication list given to you today.  *If you need a refill on your cardiac medications before your next appointment, please call your pharmacy*   Lab Work: NONE If you have labs (blood work) drawn today and your tests are completely normal, you will receive your results only by: [place] (if you have MyChart) OR A paper copy in the mail If you have any lab test that is abnormal or we need to change your treatment, we will call you to review the results.   Testing/Procedures: NONE   Follow-Up: At [facility], you and your health needs are our priority.  As part of our continuing mission to provide you with exceptional heart care, we have created designated Provider Care Teams.  These Care Teams include your primary Cardiologist (physician)  and Advanced Practice Providers (APPs -  Physician Assistants and Nurse Practitioners) who all work together to provide you with the care you need, when you need it.  We recommend signing up for the patient portal called "MyChart".  Sign up information is provided on this After Visit Summary.  MyChart is used to connect with patients for Virtual Visits (Telemedicine).  Patients are able to view lab/test results, encounter notes, upcoming appointments, etc.  Non-urgent messages can be sent to your provider as well.   To learn more about what you can do with MyChart, go to [email or web address].    Your next appointment:   1 year(s)  The format for your next appointment:   Virtual Visit   Provider:   Berniece Salines, DO      Adopting a Healthy Lifestyle.  Know what a healthy weight is for you (roughly BMI <25) and aim to maintain this   Aim for 7+ servings of fruits and vegetables daily   65-80+ fluid ounces of water or unsweet tea for healthy kidneys   Limit to max 1 drink of alcohol per day; avoid smoking/tobacco   Limit animal fats in diet for cholesterol and heart health - choose grass fed whenever available   Avoid highly processed foods, and foods high in saturated/trans fats   Aim for low stress - take time to unwind and care for your mental health   Aim for 150 min of moderate intensity exercise weekly for heart health, and weights twice weekly for bone health   Aim for 7-9 hours of sleep daily   When it comes to diets, agreement about the perfect plan isnt easy to  find, even among the experts. Experts at the [employer] developed an idea known as the Healthy Eating Plate. Just imagine a plate divided into logical, healthy portions.   The emphasis is on diet quality:   Load up on vegetables and fruits - one-half of your plate: Aim for color and variety, and remember that potatoes dont count.   Go for whole grains - one-quarter of your plate: Whole  wheat, barley, wheat berries, quinoa, oats, brown rice, and foods made with them. If you want pasta, go with whole wheat pasta.   Protein power - one-quarter of your plate: Fish, chicken, beans, and nuts are all healthy, versatile protein sources. Limit red meat.   The diet, however, does go beyond the plate, offering a few other suggestions.   Use healthy plant oils, such as olive, canola, soy, corn, sunflower and peanut. Check the labels, and avoid partially hydrogenated oil, which have unhealthy trans fats.   If youre thirsty, drink water. Coffee and tea are good in moderation, but skip sugary drinks and limit milk and dairy products to one or two daily servings.   The type of carbohydrate in the diet is more important than the amount. Some sources of carbohydrates, such as vegetables, fruits, whole grains, and beans-are healthier than others.   Finally, stay active  Signed, Berniece Salines, DO  02/22/2022 9:02 AM    [place]

## 2022-02-22 NOTE — Patient Instructions (Signed)
Medication Instructions:  Your physician recommends that you continue on your current medications as directed. Please refer to the Current Medication list given to you today.  *If you need a refill on your cardiac medications before your next appointment, please call your pharmacy*   Lab Work: NONE If you have labs (blood work) drawn today and your tests are completely normal, you will receive your results only by: [place] (if you have MyChart) OR A paper copy in the mail If you have any lab test that is abnormal or we need to change your treatment, we will call you to review the results.   Testing/Procedures: NONE   Follow-Up: At [facility], you and your health needs are our priority.  As part of our continuing mission to provide you with exceptional heart care, we have created designated Provider Care Teams.  These Care Teams include your primary Cardiologist (physician) and Advanced Practice Providers (APPs -  Physician Assistants and Nurse Practitioners) who all work together to provide you with the care you need, when you need it.  We recommend signing up for the patient portal called "MyChart".  Sign up information is provided on this After Visit Summary.  MyChart is used to connect with patients for Virtual Visits (Telemedicine).  Patients are able to view lab/test results, encounter notes, upcoming appointments, etc.  Non-urgent messages can be sent to your provider as well.   To learn more about what you can do with MyChart, go to [email or web address].    Your next appointment:   1 year(s)  The format for your next appointment:   Virtual Visit   Provider:   Berniece Salines, DO

## 2022-03-05 ENCOUNTER — Telehealth: Payer: Self-pay | Admitting: Neurology

## 2022-03-05 NOTE — Telephone Encounter (Signed)
LVM and sent mychart msg informing pt of r/s needed for 12/27 appt- Judson Roch out.

## 2022-03-07 ENCOUNTER — Ambulatory Visit: Payer: BC Managed Care – PPO | Admitting: Psychology

## 2022-03-08 ENCOUNTER — Ambulatory Visit (INDEPENDENT_AMBULATORY_CARE_PROVIDER_SITE_OTHER): Payer: BC Managed Care – PPO | Admitting: Family Medicine

## 2022-03-08 ENCOUNTER — Encounter (INDEPENDENT_AMBULATORY_CARE_PROVIDER_SITE_OTHER): Payer: Self-pay | Admitting: Family Medicine

## 2022-03-08 VITALS — BP 108/67 | HR 94 | Temp 98.2°F | Ht 64.0 in | Wt 175.0 lb

## 2022-03-08 DIAGNOSIS — I10 Essential (primary) hypertension: Secondary | ICD-10-CM | POA: Diagnosis not present

## 2022-03-08 DIAGNOSIS — E669 Obesity, unspecified: Secondary | ICD-10-CM

## 2022-03-08 DIAGNOSIS — Z6830 Body mass index (BMI) 30.0-30.9, adult: Secondary | ICD-10-CM | POA: Diagnosis not present

## 2022-03-18 NOTE — Progress Notes (Signed)
Chief Complaint:   OBESITY Abigail Singh is here to discuss her progress with her obesity treatment plan along with follow-up of her obesity related diagnoses. Abigail Singh is on practicing portion control and making smarter food choices, such as increasing vegetables and decreasing simple carbohydrates and states she is following her eating plan approximately 60% of the time. Abigail Singh states she is walking 15-20 minutes 3 times per week.  Today's visit was #: 38 Starting weight: 209 lbs Starting date: 12/13/2020 Today's weight: 175 lbs Today's date: 03/08/2022 Total lbs lost to date: 34 Total lbs lost since last in-office visit: 1  Interim History: The last couple of weeks, Abigail Singh has not been drinking enough water on some days. She is still struggling with that and is trying to change to higher protein snacks.  Subjective:   1. Essential hypertension Abigail Singh just went to see Dr. Harriet Masson, and pt's metoprolol was decreased by 25 mg daily. Pt is without dizziness and is asymptomatic and without concerns.  Assessment/Plan:  No orders of the defined types were placed in this encounter.   There are no discontinued medications.   No orders of the defined types were placed in this encounter.    1. Essential hypertension BP at goal and pt has no issues. Abigail Singh is working on healthy weight loss and exercise to improve blood pressure control. We will watch for signs of hypotension as she continues her lifestyle modifications.  Continue meds per cardiology. Decrease salt intake and increase exercise.  2. Obesity, Current BMI 30.1 Abigail Singh is currently in the action stage of change. As such, her goal is to continue with weight loss efforts. She has agreed to practicing portion control and making smarter food choices, such as increasing vegetables and decreasing simple carbohydrates.   Pt is walking 15 minutes 3 days a week. She 's encouraged to increase as tolerated. Handouts: PC/[place]; Pt will  really pay attention to servings sizes- fruits and vegetables and   proteins per day.  Exercise goals: For substantial health benefits, adults should do at least 150 minutes (2 hours and 30 minutes) a week of moderate-intensity, or 75 minutes (1 hour and 15 minutes) a week of vigorous-intensity aerobic physical activity, or an equivalent combination of moderate- and vigorous-intensity aerobic activity. Aerobic activity should be performed in episodes of at least 10 minutes, and preferably, it should be spread throughout the week.  Behavioral modification strategies: increasing lean protein intake.  Abigail Singh has agreed to follow-up with our clinic in early January. She was informed of the importance of frequent follow-up visits to maximize her success with intensive lifestyle modifications for her multiple health conditions.   Objective:   Blood pressure 108/67, pulse 94, temperature 98.2 F (36.8 C), height '5\' 4"'$  (1.626 m), weight 175 lb (79.4 kg), SpO2 99 %. Body mass index is 30.04 kg/m.  General: Cooperative, alert, well developed, in no acute distress. HEENT: Conjunctivae and lids unremarkable. Cardiovascular: Regular rhythm.  Lungs: Normal work of breathing. Neurologic: No focal deficits.   Lab Results  Component Value Date   CREATININE 0.85 12/28/2021   BUN 13 12/28/2021   NA 139 12/28/2021   K 4.4 12/28/2021   CL 102 12/28/2021   CO2 22 12/28/2021   Lab Results  Component Value Date   ALT 13 12/28/2021   AST 13 12/28/2021   ALKPHOS 114 12/28/2021   BILITOT 0.5 12/28/2021   Lab Results  Component Value Date   HGBA1C 5.7 (H) 12/28/2021   HGBA1C 5.6 04/03/2021  HGBA1C 5.8 (H) 12/13/2020   HGBA1C 5.6 12/05/2017   Lab Results  Component Value Date   INSULIN 10.8 12/28/2021   INSULIN 15.2 04/03/2021   INSULIN 13.1 12/13/2020   Lab Results  Component Value Date   TSH 1.650 12/28/2021   Lab Results  Component Value Date   CHOL 161 12/28/2021   HDL 39 (L)  12/28/2021   LDLCALC 103 (H) 12/28/2021   TRIG 100 12/28/2021   CHOLHDL 4.1 12/28/2021   Lab Results  Component Value Date   VD25OH 59.0 12/28/2021   VD25OH 54.79 07/13/2021   VD25OH 55.7 12/13/2020   Lab Results  Component Value Date   WBC 6.6 07/13/2021   HGB 13.7 07/13/2021   HCT 41.4 07/13/2021   MCV 85.3 07/13/2021   PLT 292.0 07/13/2021    Attestation Statements:   Reviewed by clinician on day of visit: allergies, medications, problem list, medical history, surgical history, family history, social history, and previous encounter notes.  Time spent on visit including pre-visit chart review and post-visit care and charting was 30 minutes.   I, Kathlene November, BS, CMA, am acting as transcriptionist for [employer], DO.   I have reviewed the above documentation for accuracy and completeness, and I agree with the above. Marjory Sneddon, D.O.  The [place] was signed into law in 2016 which includes the topic of electronic health records.  This provides immediate access to information in MyChart.  This includes consultation notes, operative notes, office notes, lab results and pathology reports.  If you have any questions about what you read please let us know at your next visit so we can discuss your concerns and take corrective action if need be.  We are right here with you.

## 2022-03-24 ENCOUNTER — Other Ambulatory Visit: Payer: Self-pay | Admitting: Family Medicine

## 2022-03-26 ENCOUNTER — Other Ambulatory Visit: Payer: Self-pay | Admitting: Family Medicine

## 2022-03-26 MED ORDER — AMPHETAMINE-DEXTROAMPHET ER 10 MG PO CP24: 20.0000 mg | ORAL_CAPSULE | Freq: Every day | ORAL | 0 refills | Status: DC

## 2022-03-26 NOTE — Telephone Encounter (Signed)
Adderall 10 mg LOV: 07/13/21 Last Refill:01/24/22 Upcoming appt: 07/16/22

## 2022-04-04 ENCOUNTER — Ambulatory Visit (INDEPENDENT_AMBULATORY_CARE_PROVIDER_SITE_OTHER): Payer: BC Managed Care – PPO | Admitting: Psychology

## 2022-04-04 DIAGNOSIS — F4310 Post-traumatic stress disorder, unspecified: Secondary | ICD-10-CM | POA: Diagnosis not present

## 2022-04-05 NOTE — Progress Notes (Signed)
[facility] Counselor/Therapist Progress Note  Patient ID: Abigail Singh, MRN: [id number],    Date: 04/04/2022  Time Spent: 60 minutes  Treatment Type: Individual Therapy  Reported Symptoms: Hx of flashbacks, worry, depression, history of hypervigilance.  Mental Status Exam: Appearance:  Casual     Behavior: Appropriate  Motor: Normal  Speech/Language:  Normal Rate  Affect: Blunt  Mood: Pleasant  Thought process: normal  Thought content:   WNL  Sensory/Perceptual disturbances:   WNL  Orientation: oriented to person, place, time/date, and situation  Attention: Good  Concentration: Good  Memory: WNL  Fund of knowledge:  Good  Insight:   Good  Judgment:  Good  Impulse Control: Good   Risk Assessment: Danger to Self:  No Self-injurious Behavior: No Danger to Others: No Duty to Warn:no Physical Aggression / Violence:No  Access to Firearms a concern: No  Gang Involvement:No   Subjective: The patient attended a face-to-face individual therapy session in the office today.  The patient presents with a blunted affect and mood is pleasant.  The patient was venting about some issues that she was having with her sister today.  She reports that her sister seems to be detaching some from the family and this is bothersome to her.  The patient also talked about her daughter [name], and some of her issues with identity and her concerns about how she is processing some of these things.  The patient is doing relatively well related to her own trauma history.  She is concerned that because her daughter has autism that she may struggle with recognizing when things are not safe and we talked about her having to just be more vigilant about how she monitors her daughters activities.  We talked about what she wants to do in regard to appointments and she wants to continue to stay once a month so that she has availability if necessary.  Interventions: Cognitive Behavioral Therapy,  Insight-Oriented, and Interpersonal  Diagnosis:PTSD (post-traumatic stress disorder)  Plan: Client Abilities/Strengths  insightful, motivated, intelligent  Client Treatment Preferences  Outpatient Individual therapy and EMDR  Client Statement of Needs  "Dr. Cheryln Manly sent me to have EMDR for my trauma"  Treatment Level  Outpatient Individual therapy  Symptoms  Demonstrates an exaggerated startle response.: (Status: improved). Describes a reliving of the event, particularly through dissociative flashbacks.: (Status:  improved). Displays a significant decline in interest and engagement in activities.:  (Status: improved). Displays significant psychological and/or physiological distress resulting  from internal and external clues that are reminiscent of the traumatic event.:  (Status: improved). Experiences disturbing and persistent thoughts, images, and/or perceptions of the  traumatic event.: N(Status: improved). Has been exposed to a traumatic event  involving actual or perceived threat of death or serious injury.: (Status:  maintained). Impairment in social, occupational, or other areas of functioning.: (Status: improved). Intentionally avoids activities, places, people, or objects (e.g., up-armored vehicles) that evoke memories of the event.:  (Status: improved). Intentionally avoids  thoughts, feelings, or discussions related to the traumatic event.: (Status:  improved). Reports difficulty concentrating as well as feelings of guilt.:  (Status: improved). Reports hypervigilance.: N (Status: improved). Reports  response of intense fear, helplessness, or horror to the traumatic event.:  (Status: improved). Symptoms present more than one month.: (Status: improved).  Problems Addressed  Posttraumatic Stress Disorder (PTSD),  Goals 1. Eliminate or reduce the negative impact trauma related symptoms have  on social, occupational, and family functioning. Objective Participate in Eye  Movement Desensitization and Reprocessing (  EMDR) to reduce emotional distress  related to traumatic thoughts, feelings, and images. Target Date: 2022-05-22 Frequency: Biweekly Progress: 90 Modality: individual Related Interventions 1. Utilize Eye Movement Desensitization and Reprocessing (EMDR) to reduce the client's  emotional reactivity to the traumatic event and reduce PTSD symptoms. 2. No longer avoids persons, places, activities, and objects that are  reminiscent of the traumatic event. 3. No longer experiences intrusive event recollections, avoidance of event  reminders, intense arousal, or disinterest in activities or  relationships. 4. Returns to the level of psychological functioning prior to exposure to  the traumatic event. 5. Thinks about or openly discusses the traumatic event with others  without experiencing psychological or physiological distress. Diagnosis F43.10 (Posttraumatic stress disorder) - Open - [Signifier: n/a] Posttraumatic Stress  Disorder  Conditions For Discharge Achievement of treatment goals and objectives. The patient has approved this plan and is making progress.  Will see patient again in 4 weeks.  Season Astacio G Johnwilliam Shepperson, LCSW

## 2022-04-18 ENCOUNTER — Encounter: Payer: Self-pay | Admitting: Family Medicine

## 2022-05-02 ENCOUNTER — Ambulatory Visit: Payer: BC Managed Care – PPO | Admitting: Neurology

## 2022-05-02 ENCOUNTER — Ambulatory Visit (INDEPENDENT_AMBULATORY_CARE_PROVIDER_SITE_OTHER): Payer: BC Managed Care – PPO | Admitting: Psychology

## 2022-05-02 DIAGNOSIS — F4310 Post-traumatic stress disorder, unspecified: Secondary | ICD-10-CM | POA: Diagnosis not present

## 2022-05-02 NOTE — Progress Notes (Signed)
[facility] Counselor/Therapist Progress Note  Patient ID: Abigail Singh, MRN: [id number],    Date: 05/02/2022  Time Spent: 60 minutes  Treatment Type: Individual Therapy  Reported Symptoms: Hx of flashbacks, worry, depression, history of hypervigilance.  Mental Status Exam: Appearance:  Casual     Behavior: Appropriate  Motor: Normal  Speech/Language:  Normal Rate  Affect: Blunt  Mood: Pleasant  Thought process: normal  Thought content:   WNL  Sensory/Perceptual disturbances:   WNL  Orientation: oriented to person, place, time/date, and situation  Attention: Good  Concentration: Good  Memory: WNL  Fund of knowledge:  Good  Insight:   Good  Judgment:  Good  Impulse Control: Good   Risk Assessment: Danger to Self:  No Self-injurious Behavior: No Danger to Others: No Duty to Warn:no Physical Aggression / Violence:No  Access to Firearms a concern: No  Gang Involvement:No   Subjective: The patient attended a face-to-face individual therapy session in the office today.  The patient presents with a blunted affect and mood is pleasant.  The patient reports that their holidays were good.  She states that everyone in her family has had COVID since I saw her last.  The patient talked today about being frustrated about the clutter in her house.  The patient has a tendency to hoard at times.  We talked about her just going ahead and cleaning up the house instead of trying to get the girls to help her.  I told her that I thought that it would be better for her mentally if she would just go ahead and take care of it herself.  The patient has a lot of household responsibilities as well as working full time and her husband does not seem to do a lot around the house.  We talked about a plan for her to be able to take care of some of the organization and cleaning that she needs to do. Interventions: Cognitive Behavioral Therapy, Insight-Oriented, and  Interpersonal  Diagnosis:PTSD (post-traumatic stress disorder)  Plan: Client Abilities/Strengths  insightful, motivated, intelligent  Client Treatment Preferences  Outpatient Individual therapy and EMDR  Client Statement of Needs  "Dr. Cheryln Manly sent me to have EMDR for my trauma"  Treatment Level  Outpatient Individual therapy  Symptoms  Demonstrates an exaggerated startle response.: (Status: improved). Describes a reliving of the event, particularly through dissociative flashbacks.: (Status:  improved). Displays a significant decline in interest and engagement in activities.:  (Status: improved). Displays significant psychological and/or physiological distress resulting  from internal and external clues that are reminiscent of the traumatic event.:  (Status: improved). Experiences disturbing and persistent thoughts, images, and/or perceptions of the  traumatic event.: N(Status: improved). Has been exposed to a traumatic event  involving actual or perceived threat of death or serious injury.: (Status:  maintained). Impairment in social, occupational, or other areas of functioning.: (Status: improved). Intentionally avoids activities, places, people, or objects (e.g., up-armored vehicles) that evoke memories of the event.:  (Status: improved). Intentionally avoids  thoughts, feelings, or discussions related to the traumatic event.: (Status:  improved). Reports difficulty concentrating as well as feelings of guilt.:  (Status: improved). Reports hypervigilance.: N (Status: improved). Reports  response of intense fear, helplessness, or horror to the traumatic event.:  (Status: improved). Symptoms present more than one month.: (Status: improved).  Problems Addressed  Posttraumatic Stress Disorder (PTSD),  Goals 1. Eliminate or reduce the negative impact trauma related symptoms have  on social, occupational, and family functioning. Objective Participate in [place]  Movement Desensitization  and Reprocessing (EMDR) to reduce emotional distress  related to traumatic thoughts, feelings, and images. Target Date: 2022-05-22 Frequency: Biweekly Progress: 90 Modality: individual Related Interventions 1. Utilize Eye Movement Desensitization and Reprocessing (EMDR) to reduce the client's  emotional reactivity to the traumatic event and reduce PTSD symptoms. 2. No longer avoids persons, places, activities, and objects that are  reminiscent of the traumatic event. 3. No longer experiences intrusive event recollections, avoidance of event  reminders, intense arousal, or disinterest in activities or  relationships. 4. Returns to the level of psychological functioning prior to exposure to  the traumatic event. 5. Thinks about or openly discusses the traumatic event with others  without experiencing psychological or physiological distress. Diagnosis F43.10 (Posttraumatic stress disorder) - Open - [Signifier: n/a] Posttraumatic Stress  Disorder  Conditions For Discharge Achievement of treatment goals and objectives. The patient has approved this plan and is making progress.  Will see patient again in 4 weeks.  Rayli Wiederhold G Mackenna Kamer, LCSW

## 2022-05-10 ENCOUNTER — Ambulatory Visit (INDEPENDENT_AMBULATORY_CARE_PROVIDER_SITE_OTHER): Payer: BC Managed Care – PPO | Admitting: Family Medicine

## 2022-05-14 LAB — HM MAMMOGRAPHY

## 2022-05-27 IMAGING — CR DG CHEST 2V
2 series · 2 of 2 positions shown · non-contrast
Comparison: 09/14/2019

CLINICAL DATA: Chronic cough, shortness of breath

EXAM:
CHEST - 2 VIEW

[w chest pa]
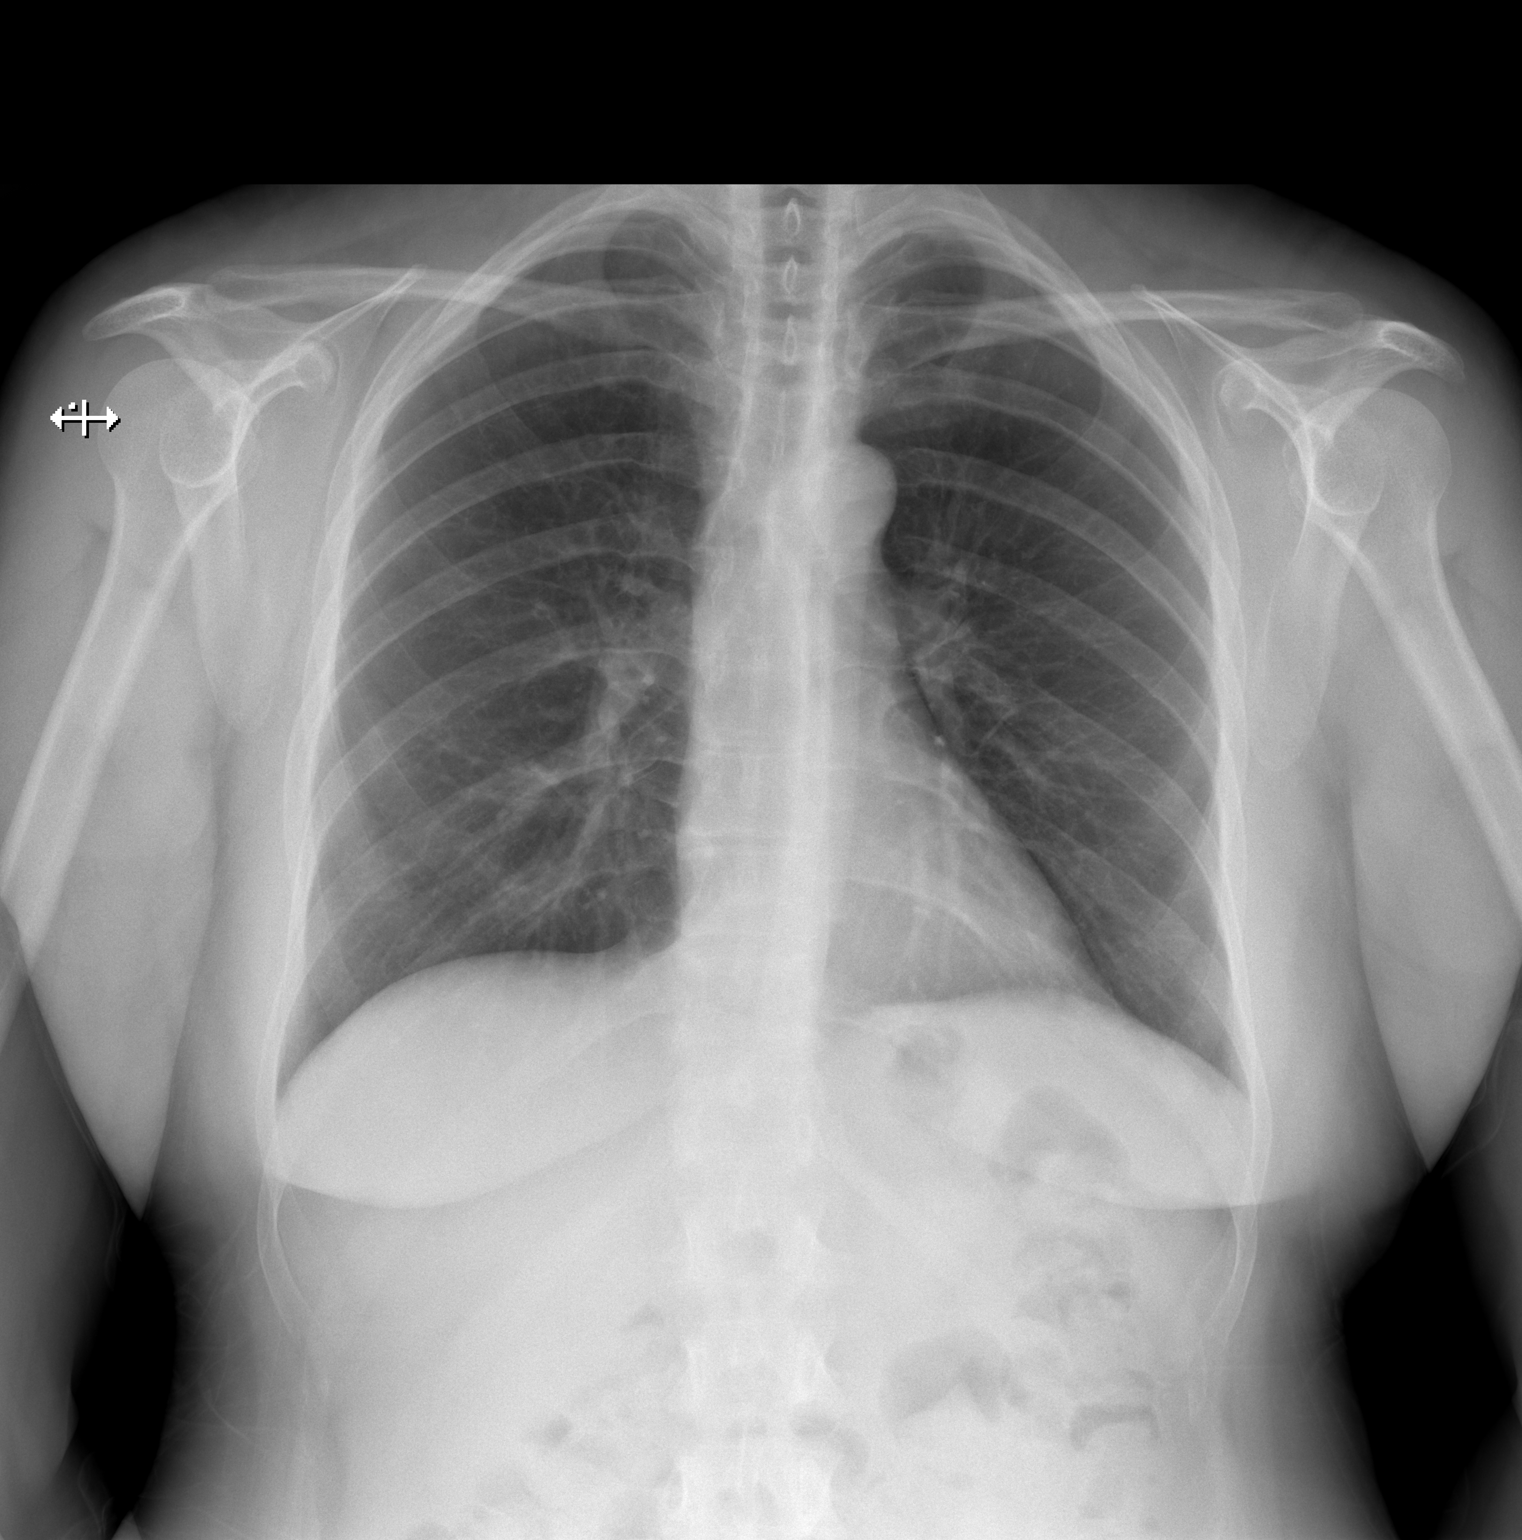

[w chest lat]
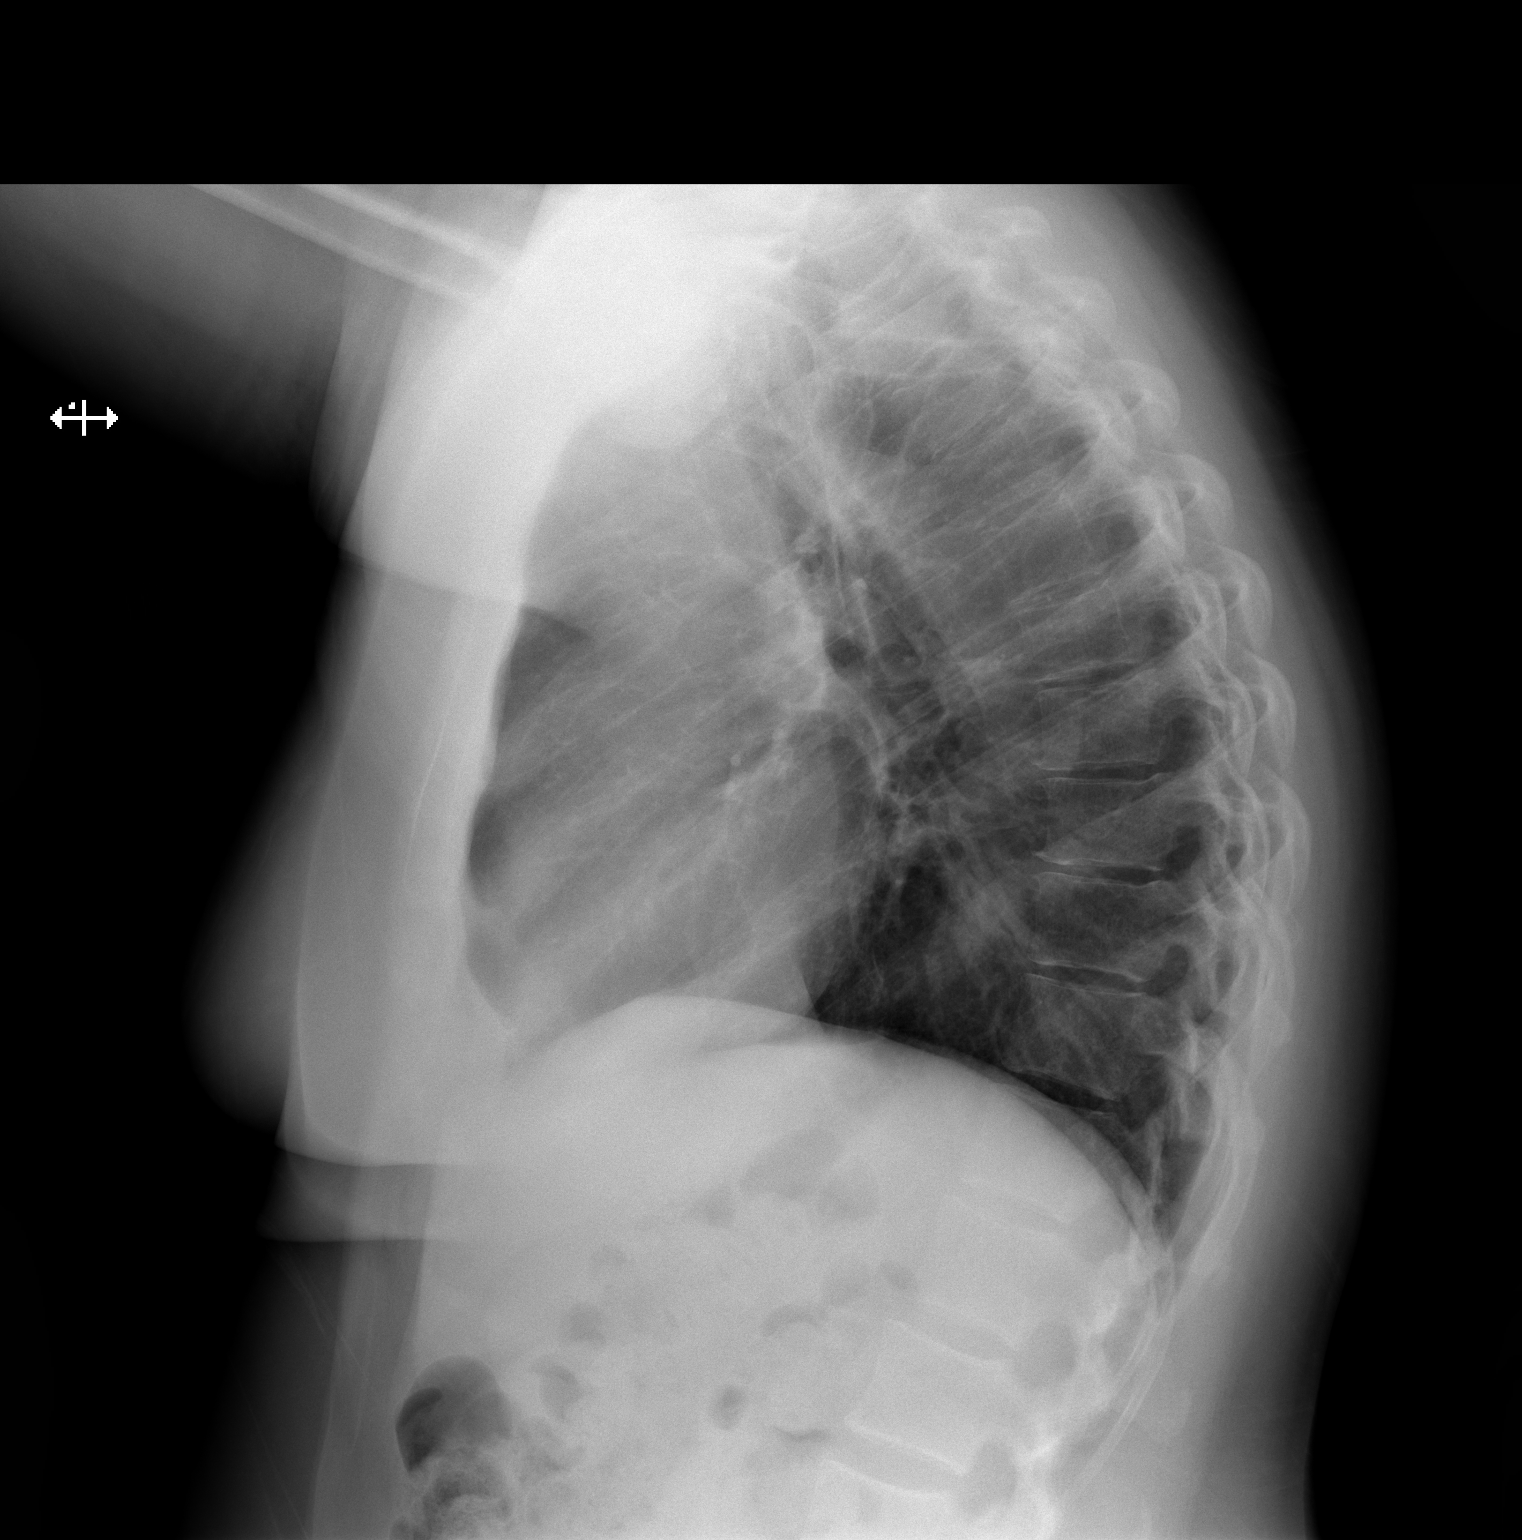

[2 of 2 positions shown; findings below may reference images not displayed]

FINDINGS: The heart size and mediastinal contours are within normal limits.
Both lungs are clear. The visualized skeletal structures are
unremarkable.
IMPRESSION: Normal study

## 2022-05-29 ENCOUNTER — Other Ambulatory Visit: Payer: Self-pay | Admitting: Family Medicine

## 2022-05-29 MED ORDER — AMPHETAMINE-DEXTROAMPHET ER 10 MG PO CP24: 20.0000 mg | ORAL_CAPSULE | Freq: Every day | ORAL | 0 refills | Status: DC

## 2022-05-29 NOTE — Telephone Encounter (Signed)
Adderall XR 10 mg LOV: 01/25/22 Last Refill:03/26/22 Upcoming appt: no apt

## 2022-05-29 NOTE — Telephone Encounter (Signed)
Left [facility] stating Rx has been sent in 

## 2022-05-30 ENCOUNTER — Ambulatory Visit: Payer: BC Managed Care – PPO | Admitting: Psychology

## 2022-06-19 IMAGING — DX DG CHEST 2V
2 series · 2 of 2 positions shown · non-contrast
Comparison: May 31, 2020

CLINICAL DATA: Cough

EXAM:
CHEST - 2 VIEW

[chest pa]
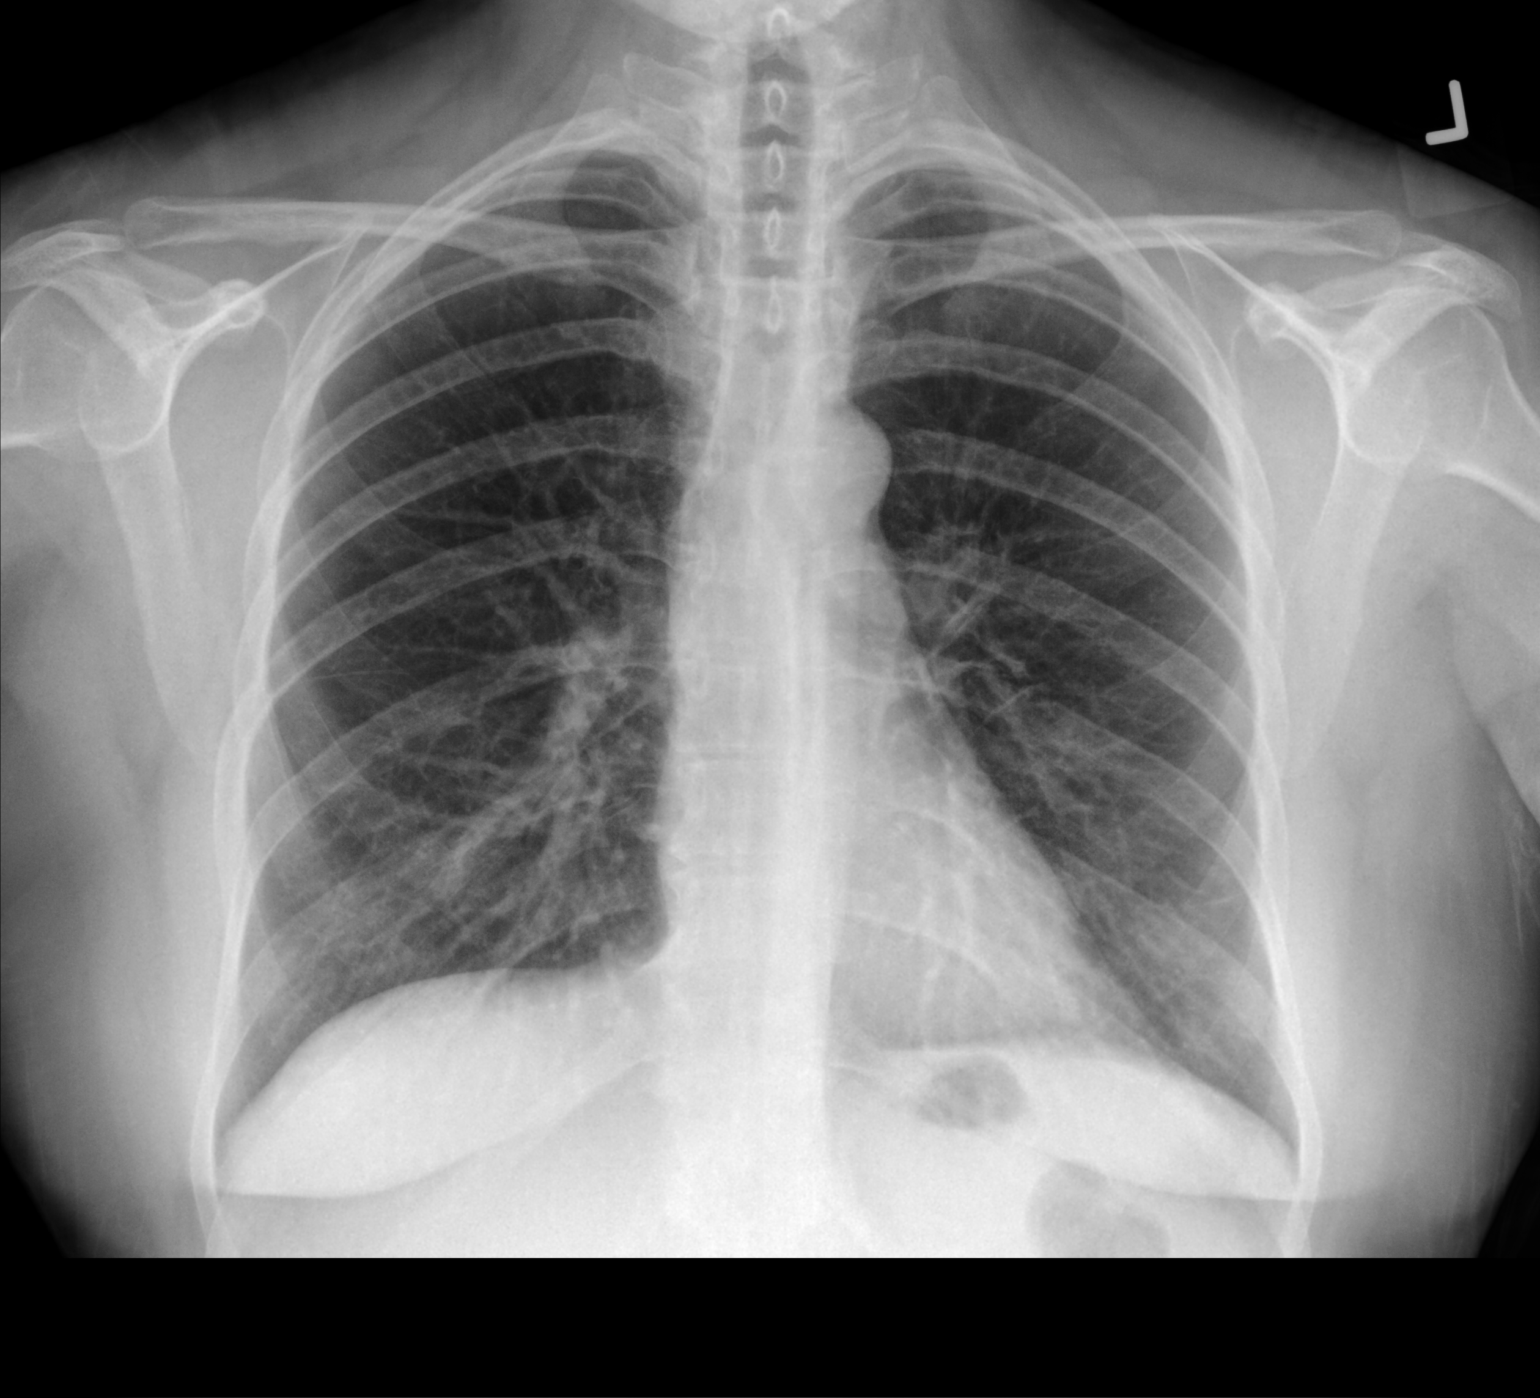

[chest lat]
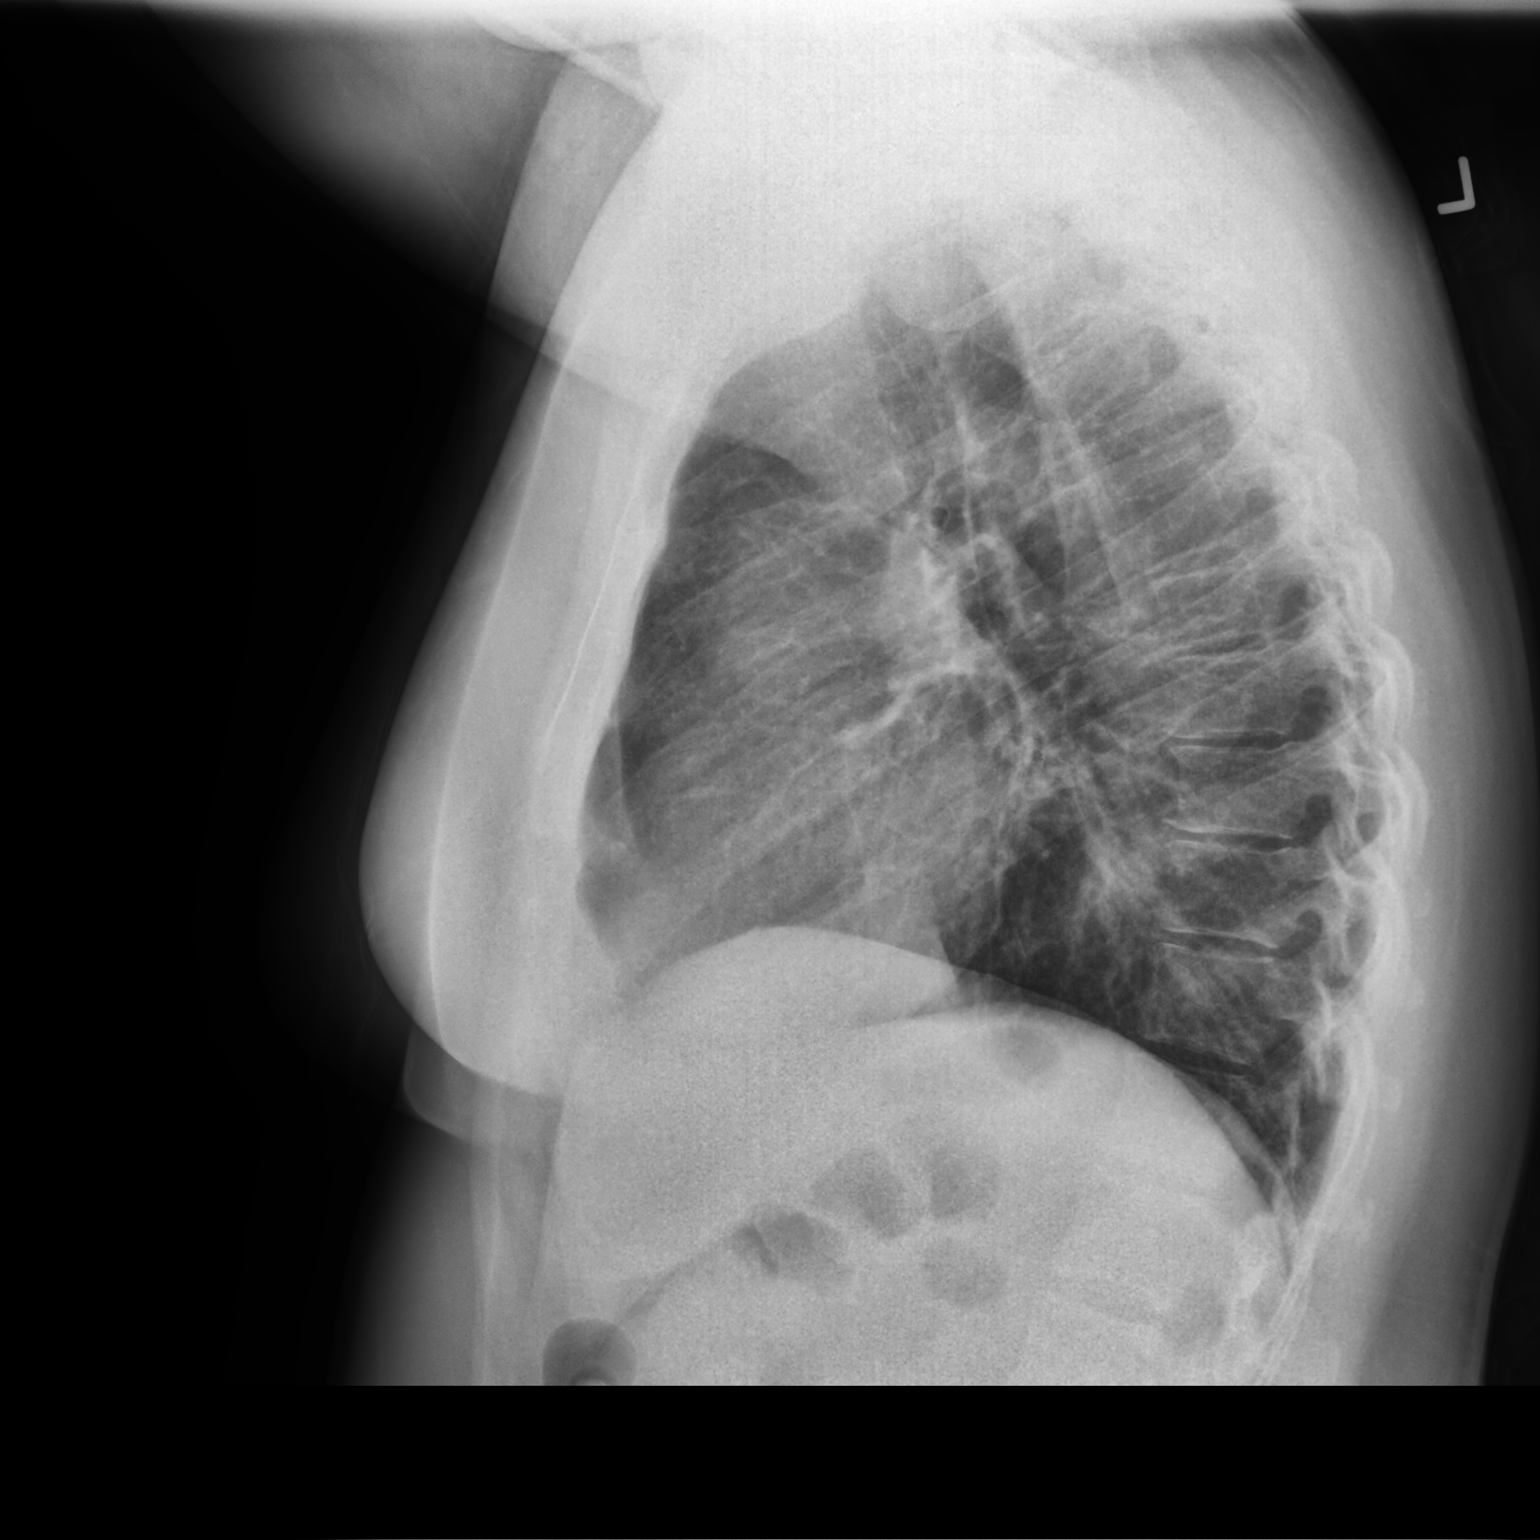

[2 of 2 positions shown; findings below may reference images not displayed]

FINDINGS: Lungs are clear. Heart size and pulmonary vascularity are normal. No
adenopathy. No bone lesions.
IMPRESSION: Lungs clear.  Cardiac silhouette normal.

## 2022-06-27 ENCOUNTER — Ambulatory Visit (INDEPENDENT_AMBULATORY_CARE_PROVIDER_SITE_OTHER): Payer: BC Managed Care – PPO | Admitting: Psychology

## 2022-06-27 DIAGNOSIS — F4310 Post-traumatic stress disorder, unspecified: Secondary | ICD-10-CM

## 2022-06-28 NOTE — Progress Notes (Signed)
[facility] Counselor/Therapist Progress Note  Patient ID: Abigail Singh, MRN: [id number],    Date: 06/27/2022  Time Spent: 60 minutes  Treatment Type: Individual Therapy  Reported Symptoms: Hx of flashbacks, worry, depression, history of hypervigilance.  Mental Status Exam: Appearance:  Casual     Behavior: Appropriate  Motor: Normal  Speech/Language:  Normal Rate  Affect: Blunt  Mood: Pleasant  Thought process: normal  Thought content:   WNL  Sensory/Perceptual disturbances:   WNL  Orientation: oriented to person, place, time/date, and situation  Attention: Good  Concentration: Good  Memory: WNL  Fund of knowledge:  Good  Insight:   Good  Judgment:  Good  Impulse Control: Good   Risk Assessment: Danger to Self:  No Self-injurious Behavior: No Danger to Others: No Duty to Warn:no Physical Aggression / Violence:No  Access to Firearms a concern: No  Gang Involvement:No   Subjective: The patient attended a face-to-face individual therapy session in the office today.  The patient presents with a blunted affect and mood is pleasant.  The patient reports that things are moving along.  She states that her father is no longer getting treatments for cancer and he seems to be doing well.  The patient also reports that her husband is doing okay with his kidney condition however she would like some more definite answers about what can be done.  The patient is not having any symptoms right now of PTSD.  She is not doing a very good job of taking care of herself and maybe gets an hour a week to take care of herself.  We talked about the need for her to try to expand that so that she could do a little more to take care of her own needs.  She focuses a lot on her family and making sure that they are all taking care of.  The patient did become tearful when talking about her daughter and the difficulty that she has with her identity and autism.  Interventions: Cognitive  Behavioral Therapy, Insight-Oriented, and Interpersonal  Diagnosis:PTSD (post-traumatic stress disorder)  Plan: Client Abilities/Strengths  insightful, motivated, intelligent  Client Treatment Preferences  Outpatient Individual therapy and EMDR  Client Statement of Needs  "Dr. Cheryln Manly sent me to have EMDR for my trauma"  Treatment Level  Outpatient Individual therapy  Symptoms  Demonstrates an exaggerated startle response.: (Status: improved). Describes a reliving of the event, particularly through dissociative flashbacks.: (Status:  improved). Displays a significant decline in interest and engagement in activities.:  (Status: improved). Displays significant psychological and/or physiological distress resulting  from internal and external clues that are reminiscent of the traumatic event.:  (Status: improved). Experiences disturbing and persistent thoughts, images, and/or perceptions of the  traumatic event.: N(Status: improved). Has been exposed to a traumatic event  involving actual or perceived threat of death or serious injury.: (Status:  maintained). Impairment in social, occupational, or other areas of functioning.: (Status: improved). Intentionally avoids activities, places, people, or objects (e.g., up-armored vehicles) that evoke memories of the event.:  (Status: improved). Intentionally avoids  thoughts, feelings, or discussions related to the traumatic event.: (Status:  improved). Reports difficulty concentrating as well as feelings of guilt.:  (Status: improved). Reports hypervigilance.: N (Status: improved). Reports  response of intense fear, helplessness, or horror to the traumatic event.:  (Status: improved). Symptoms present more than one month.: (Status: improved).  Problems Addressed  Posttraumatic Stress Disorder (PTSD),  Goals 1. Eliminate or reduce the negative impact trauma related symptoms have  on social, occupational, and family  functioning. Objective Participate in Eye Movement Desensitization and Reprocessing (EMDR) to reduce emotional distress  related to traumatic thoughts, feelings, and images. Target Date: 2023-05-23 Frequency: Biweekly Progress: 90 Modality: individual Related Interventions 1. Utilize Eye Movement Desensitization and Reprocessing (EMDR) to reduce the client's  emotional reactivity to the traumatic event and reduce PTSD symptoms. 2. No longer avoids persons, places, activities, and objects that are  reminiscent of the traumatic event. 3. No longer experiences intrusive event recollections, avoidance of event  reminders, intense arousal, or disinterest in activities or  relationships. 4. Returns to the level of psychological functioning prior to exposure to  the traumatic event. 5. Thinks about or openly discusses the traumatic event with others  without experiencing psychological or physiological distress. Diagnosis F43.10 (Posttraumatic stress disorder) - Open - [Signifier: n/a] Posttraumatic Stress  Disorder  Conditions For Discharge Achievement of treatment goals and objectives. The patient has approved this plan and is making progress.  Will see patient again in 4 weeks.  [name], LCSW

## 2022-07-06 ENCOUNTER — Other Ambulatory Visit: Payer: Self-pay | Admitting: Family Medicine

## 2022-07-16 ENCOUNTER — Encounter: Payer: Self-pay | Admitting: Family Medicine

## 2022-07-16 ENCOUNTER — Ambulatory Visit (INDEPENDENT_AMBULATORY_CARE_PROVIDER_SITE_OTHER): Payer: BC Managed Care – PPO | Admitting: Family Medicine

## 2022-07-16 VITALS — BP 128/78 | HR 81 | Temp 98.6°F | Resp 17 | Ht 64.0 in | Wt 187.0 lb

## 2022-07-16 DIAGNOSIS — E669 Obesity, unspecified: Secondary | ICD-10-CM | POA: Diagnosis not present

## 2022-07-16 DIAGNOSIS — Z0000 Encounter for general adult medical examination without abnormal findings: Secondary | ICD-10-CM | POA: Diagnosis not present

## 2022-07-16 DIAGNOSIS — E559 Vitamin D deficiency, unspecified: Secondary | ICD-10-CM | POA: Diagnosis not present

## 2022-07-16 DIAGNOSIS — Z23 Encounter for immunization: Secondary | ICD-10-CM | POA: Diagnosis not present

## 2022-07-16 DIAGNOSIS — F988 Other specified behavioral and emotional disorders with onset usually occurring in childhood and adolescence: Secondary | ICD-10-CM | POA: Diagnosis not present

## 2022-07-16 LAB — BASIC METABOLIC PANEL
BUN: 14 mg/dL (ref 6–23)
CO2: 27 mEq/L (ref 19–32)
Calcium: 9.3 mg/dL (ref 8.4–10.5)
Chloride: 104 mEq/L (ref 96–112)
Creatinine, Ser: 0.76 mg/dL (ref 0.40–1.20)
GFR: 93.92 mL/min (ref >60.00)
Glucose, Bld: 93 mg/dL (ref 70–99)
Potassium: 3.7 mEq/L (ref 3.5–5.1)
Sodium: 138 mEq/L (ref 135–145)

## 2022-07-16 LAB — CBC WITH DIFFERENTIAL/PLATELET
Basophils Absolute: 0.1 10*3/uL (ref 0.0–0.1)
Basophils Relative: 0.8 % (ref 0.0–3.0)
Eosinophils Absolute: 0.1 10*3/uL (ref 0.0–0.7)
Eosinophils Relative: 1.5 % (ref 0.0–5.0)
HCT: 42.5 % (ref 36.0–46.0)
Hemoglobin: 14.3 g/dL (ref 12.0–15.0)
Lymphocytes Relative: 32 % (ref 12.0–46.0)
Lymphs Abs: 2 10*3/uL (ref 0.7–4.0)
MCHC: 33.6 g/dL (ref 30.0–36.0)
MCV: 87.8 fl (ref 78.0–100.0)
Monocytes Absolute: 0.3 10*3/uL (ref 0.1–1.0)
Monocytes Relative: 5.3 % (ref 3.0–12.0)
Neutro Abs: 3.8 10*3/uL (ref 1.4–7.7)
Neutrophils Relative %: 60.4 % (ref 43.0–77.0)
Platelets: 256 10*3/uL (ref 150.0–400.0)
RBC: 4.84 Mil/uL (ref 3.87–5.11)
RDW: 13.3 % (ref 11.5–15.5)
WBC: 6.2 10*3/uL (ref 4.0–10.5)

## 2022-07-16 LAB — HEPATIC FUNCTION PANEL
ALT: 12 U/L (ref 0–35)
AST: 17 U/L (ref 0–37)
Albumin: 4 g/dL (ref 3.5–5.2)
Alkaline Phosphatase: 74 U/L (ref 39–117)
Bilirubin, Direct: 0.1 mg/dL (ref 0.0–0.3)
Total Bilirubin: 0.4 mg/dL (ref 0.2–1.2)
Total Protein: 7.2 g/dL (ref 6.0–8.3)

## 2022-07-16 LAB — LIPID PANEL
Cholesterol: 160 mg/dL (ref 0–200)
HDL: 35.6 mg/dL — ABNORMAL LOW (ref >39.00)
LDL Cholesterol: 99 mg/dL (ref 0–99)
NonHDL: 124.34
Total CHOL/HDL Ratio: 4
Triglycerides: 125 mg/dL (ref 0.0–149.0)
VLDL: 25 mg/dL (ref 0.0–40.0)

## 2022-07-16 LAB — TSH: TSH: 1.88 u[IU]/mL (ref 0.35–5.50)

## 2022-07-16 LAB — VITAMIN D 25 HYDROXY (VIT D DEFICIENCY, FRACTURES): VITD: 46.93 ng/mL (ref 30.00–100.00)

## 2022-07-16 MED ORDER — PANTOPRAZOLE SODIUM 40 MG PO TBEC: DELAYED_RELEASE_TABLET | ORAL | 1 refills | Status: DC

## 2022-07-16 MED ORDER — AMPHETAMINE-DEXTROAMPHET ER 10 MG PO CP24: 20.0000 mg | ORAL_CAPSULE | Freq: Every day | ORAL | 0 refills | Status: DC

## 2022-07-16 NOTE — Patient Instructions (Addendum)
Follow up in 1 year or as needed We'll notify you of your lab results and make any changes if needed Continue to work on healthy diet and regular exercise- you can do it!!! Call with any questions or concerns Stay Safe!  Stay Healthy! Hang in there!!! 

## 2022-07-16 NOTE — Assessment & Plan Note (Signed)
Check labs and replete prn. 

## 2022-07-16 NOTE — Progress Notes (Signed)
   Subjective:    Patient ID: Abigail Singh, female    DOB: 1976/01/29, 47 y.o.   MRN: [id number]  HPI CPE- UTD on mammo, colonoscopy.  Due for Tdap, and pap (scheduled)  Patient Care Team    Relationship Specialty Notifications Start End  Midge Minium, MD PCP - General   04/19/10   Berniece Salines, DO PCP - Cardiology Cardiology  11/11/20   Servando Salina, MD Consulting Physician Obstetrics and Gynecology  11/26/16   Irene Shipper, MD Consulting Physician Gastroenterology  07/11/20   Tanda Rockers, MD Consulting Physician Pulmonary Disease  07/11/20      Health Maintenance  Topic Date Due   DTaP/Tdap/Td (8 - Td or Tdap) 10/31/2021   PAP SMEAR-Modifier  03/19/2022   INFLUENZA VACCINE  08/05/2022 (Originally 12/05/2021)   MAMMOGRAM  05/15/2023   COLONOSCOPY (Pts 45-63yrs Insurance coverage will need to be confirmed)  11/09/2026   HIV Screening  Completed   HPV VACCINES  Aged Out   COVID-19 Vaccine  Discontinued   Hepatitis C Screening  Discontinued      Review of Systems Patient reports no vision/ hearing changes, adenopathy,fever, persistant/recurrent hoarseness , swallowing issues, chest pain, palpitations, edema, persistant/recurrent cough, hemoptysis, dyspnea (rest/exertional/paroxysmal nocturnal), gastrointestinal bleeding (melena, rectal bleeding), abdominal pain, significant heartburn, bowel changes, GU symptoms (dysuria, hematuria, incontinence), Gyn symptoms (abnormal  bleeding, pain),  syncope, focal weakness, memory loss, numbness & tingling, skin/hair/nail changes, abnormal bruising or bleeding, anxiety, or depression.   + 12 lb weight gain    Objective:   Physical Exam General Appearance:    Alert, cooperative, no distress, appears stated age  Head:    Normocephalic, without obvious abnormality, atraumatic  Eyes:    PERRL, conjunctiva/corneas clear, EOM's intact both eyes  Ears:    Normal TM's and external ear canals, both ears  Nose:   Nares normal, septum  midline, mucosa normal, no drainage    or sinus tenderness  Throat:   Lips, mucosa, and tongue normal; teeth and gums normal  Neck:   Supple, symmetrical, trachea midline, no adenopathy;    Thyroid: no enlargement/tenderness/nodules  Back:     Symmetric, no curvature, ROM normal, no CVA tenderness  Lungs:     Clear to auscultation bilaterally, respirations unlabored  Chest Wall:    No tenderness or deformity   Heart:    Regular rate and rhythm, S1 and S2 normal, no murmur, rub   or gallop  Breast Exam:    Deferred to GYN  Abdomen:     Soft, non-tender, bowel sounds active all four quadrants,    no masses, no organomegaly  Genitalia:    Deferred to GYN  Rectal:    Extremities:   Extremities normal, atraumatic, no cyanosis or edema  Pulses:   2+ and symmetric all extremities  Skin:   Skin color, texture, turgor normal, no rashes or lesions  Lymph nodes:   Cervical, supraclavicular, and axillary nodes normal  Neurologic:   CNII-XII intact, normal strength, sensation and reflexes    throughout          Assessment & Plan:

## 2022-07-16 NOTE — Assessment & Plan Note (Signed)
Pt's PE WNL w/ exception of BMI.  Tdap given.  Pap scheduled.  UTD on mammo and colonoscopy.  Check labs.  Anticipatory guidance provided.

## 2022-07-16 NOTE — Assessment & Plan Note (Signed)
+   12 lb weight gain.  Encouraged low carb diet and regular exercise.  Check labs to risk stratify.  Will follow.

## 2022-07-17 ENCOUNTER — Telehealth: Payer: Self-pay

## 2022-07-17 NOTE — Telephone Encounter (Signed)
-----   Message from Midge Minium, MD sent at 07/17/2022  7:04 AM EDT ----- Labs look great!  No changes at this time

## 2022-07-17 NOTE — Telephone Encounter (Signed)
Informed pt of lab results  

## 2022-07-25 ENCOUNTER — Ambulatory Visit (INDEPENDENT_AMBULATORY_CARE_PROVIDER_SITE_OTHER): Payer: BC Managed Care – PPO | Admitting: Psychology

## 2022-07-25 DIAGNOSIS — F4310 Post-traumatic stress disorder, unspecified: Secondary | ICD-10-CM | POA: Diagnosis not present

## 2022-07-25 NOTE — Progress Notes (Signed)
[facility] Counselor/Therapist Progress Note  Patient ID: Abigail Singh, MRN: [id number],    Date: 07/25/2022  Time Spent: 60 minutes  Treatment Type: Individual Therapy  Reported Symptoms: Hx of flashbacks, worry, depression, history of hypervigilance.  Mental Status Exam: Appearance:  Casual     Behavior: Appropriate  Motor: Normal  Speech/Language:  Normal Rate  Affect: Blunt  Mood: Pleasant  Thought process: normal  Thought content:   WNL  Sensory/Perceptual disturbances:   WNL  Orientation: oriented to person, place, time/date, and situation  Attention: Good  Concentration: Good  Memory: WNL  Fund of knowledge:  Good  Insight:   Good  Judgment:  Good  Impulse Control: Good   Risk Assessment: Danger to Self:  No Self-injurious Behavior: No Danger to Others: No Duty to Warn:no Physical Aggression / Violence:No  Access to Firearms a concern: No  Gang Involvement:No   Subjective: The patient attended a face-to-face individual therapy session in the office today.  The patient presents with a blunted affect and mood is pleasant.  The patient reports that things seem to be settling down some for her.  She states that her father's treatments are over and her husband seems to be doing a little better as well.  She reports that she still feels somewhat anxious about what is going on with her daughter Abigail Singh.  The patient says that Abigail Singh is struggling with her identity and she has autism.  She did have a therapist and she has not been back to her therapist in a while.  I recommended that the patient consider following up with a therapist or getting a new therapist if she felt that the need was there.  I told her about our services through [facility].  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 Interventions: Cognitive Behavioral Therapy, Insight-Oriented, and  Interpersonal  Diagnosis:PTSD (post-traumatic stress disorder)  Plan: Client Abilities/Strengths  insightful, motivated, intelligent  Client Treatment Preferences  Outpatient Individual therapy and EMDR  Client Statement of Needs  "Dr. Cheryln Manly sent me to have EMDR for my trauma"  Treatment Level  Outpatient Individual therapy  Symptoms  Demonstrates an exaggerated startle response.: (Status: improved). Describes a reliving of the event, particularly through dissociative flashbacks.: (Status:  improved). Displays a significant decline in interest and engagement in activities.:  (Status: improved). Displays significant psychological and/or physiological distress resulting  from internal and external clues that are reminiscent of the traumatic event.:  (Status: improved). Experiences disturbing and persistent thoughts, images, and/or perceptions of the  traumatic event.: N(Status: improved). Has been exposed to a traumatic event  involving actual or perceived threat of death or serious injury.: (Status:  maintained). Impairment in social, occupational, or other areas of functioning.: (Status: improved). Intentionally avoids activities, places, people, or objects (e.g., up-armored vehicles) that evoke memories of the event.:  (Status: improved). Intentionally avoids  thoughts, feelings, or discussions related to the traumatic event.: (Status:  improved). Reports difficulty concentrating as well as feelings of guilt.:  (Status: improved). Reports hypervigilance.: N (Status: improved). Reports  response of intense fear, helplessness, or horror to the traumatic event.:  (Status: improved). Symptoms present more than one month.: (Status: improved).  Problems Addressed  Posttraumatic Stress Disorder (PTSD),  Goals 1. Eliminate or reduce the negative impact trauma related symptoms have  on social, occupational, and family functioning. Objective Participate in Eye Movement Desensitization  and Reprocessing (EMDR) to reduce emotional distress  related to traumatic thoughts, feelings, and images. Target Date: 2023-05-23 Frequency: Biweekly Progress: 90 Modality: individual Related Interventions 1. Utilize Eye Movement Desensitization and Reprocessing (EMDR) to reduce the client's  emotional reactivity to the traumatic event and reduce PTSD symptoms. 2. No longer avoids persons, places, activities, and objects that are  reminiscent of the traumatic event. 3. No longer experiences intrusive event recollections, avoidance of event  reminders, intense arousal, or disinterest in activities or  relationships. 4. Returns to the level of psychological functioning prior to exposure to  the traumatic event. 5. Thinks about or openly discusses the traumatic event with others  without experiencing psychological or physiological distress. Diagnosis F43.10 (Posttraumatic stress disorder) - Open - [Signifier: n/a] Posttraumatic Stress  Disorder  Conditions For Discharge Achievement of treatment goals and objectives. The patient has approved this plan and is making progress.  Will see patient again in 4 weeks.  Andray Assefa G Samuel Rittenhouse, LCSW

## 2022-08-22 ENCOUNTER — Ambulatory Visit (INDEPENDENT_AMBULATORY_CARE_PROVIDER_SITE_OTHER): Payer: BC Managed Care – PPO | Admitting: Psychology

## 2022-08-22 DIAGNOSIS — F4310 Post-traumatic stress disorder, unspecified: Secondary | ICD-10-CM

## 2022-08-23 NOTE — Progress Notes (Signed)
[facility] Counselor/Therapist Progress Note  Patient ID: Abigail Singh, MRN: [id number],    Date: 08/22/2022  Time Spent: 60 minutes  Treatment Type: Individual Therapy  Reported Symptoms: Hx of flashbacks, worry, depression, history of hypervigilance.  Mental Status Exam: Appearance:  Casual     Behavior: Appropriate  Motor: Normal  Speech/Language:  Normal Rate  Affect: Blunt  Mood: anxious  Thought process: normal  Thought content:   WNL  Sensory/Perceptual disturbances:   WNL  Orientation: oriented to person, place, time/date, and situation  Attention: Good  Concentration: Good  Memory: WNL  Fund of knowledge:  Good  Insight:   Good  Judgment:  Good  Impulse Control: Good   Risk Assessment: Danger to Self:  No Self-injurious Behavior: No Danger to Others: No Duty to Warn:no Physical Aggression / Violence:No  Access to Firearms a concern: No  Gang Involvement:No   Subjective: The patient attended a face-to-face individual therapy session in the office today.  The patient presents with a blunted affect and mood is a little anxious.  The patient talked today about what is going on with her children and it seems that all of her girls have some problem with anxiety.  She talked mostly about her youngest daughter Aldean Jewett and I recommended that she get Millie into some sort of counseling situation again.  I offered for the patient to come back more regularly as she is only being seen 1 time a month now and it seems that she is having more stress in her life related to her children and also her husband.  We will continue to meet with her monthly unless she decides to come in more regularly.  Interventions: Cognitive Behavioral Therapy, Insight-Oriented, and Interpersonal  Diagnosis:PTSD (post-traumatic stress disorder)  Plan: Client Abilities/Strengths  insightful, motivated, intelligent  Client Treatment Preferences  Outpatient Individual therapy and  EMDR  Client Statement of Needs  "Dr. Dellia Cloud sent me to have EMDR for my trauma"  Treatment Level  Outpatient Individual therapy  Symptoms  Demonstrates an exaggerated startle response.: (Status: improved). Describes a reliving of the event, particularly through dissociative flashbacks.: (Status:  improved). Displays a significant decline in interest and engagement in activities.:  (Status: improved). Displays significant psychological and/or physiological distress resulting  from internal and external clues that are reminiscent of the traumatic event.:  (Status: improved). Experiences disturbing and persistent thoughts, images, and/or perceptions of the  traumatic event.: N(Status: improved). Has been exposed to a traumatic event  involving actual or perceived threat of death or serious injury.: (Status:  maintained). Impairment in social, occupational, or other areas of functioning.: (Status: improved). Intentionally avoids activities, places, people, or objects (e.g., up-armored vehicles) that evoke memories of the event.:  (Status: improved). Intentionally avoids  thoughts, feelings, or discussions related to the traumatic event.: (Status:  improved). Reports difficulty concentrating as well as feelings of guilt.:  (Status: improved). Reports hypervigilance.: N (Status: improved). Reports  response of intense fear, helplessness, or horror to the traumatic event.:  (Status: improved). Symptoms present more than one month.: (Status: improved).  Problems Addressed  Posttraumatic Stress Disorder (PTSD),  Goals 1. Eliminate or reduce the negative impact trauma related symptoms have  on social, occupational, and family functioning. Objective Participate in Eye Movement Desensitization and Reprocessing (EMDR) to reduce emotional distress  related to traumatic thoughts, feelings, and images. Target Date: 2023-05-23 Frequency: Biweekly Progress: 90 Modality: individual Related  Interventions 1. Utilize Eye Movement Desensitization and Reprocessing (EMDR) to reduce the client's  emotional reactivity to the traumatic event and reduce PTSD symptoms. 2. No longer avoids persons, places, activities, and objects that are  reminiscent of the traumatic event. 3. No longer experiences intrusive event recollections, avoidance of event  reminders, intense arousal, or disinterest in activities or  relationships. 4. Returns to the level of psychological functioning prior to exposure to  the traumatic event. 5. Thinks about or openly discusses the traumatic event with others  without experiencing psychological or physiological distress. Diagnosis F43.10 (Posttraumatic stress disorder) - Open - [Signifier: n/a] Posttraumatic Stress  Disorder  Conditions For Discharge Achievement of treatment goals and objectives. The patient has approved this plan and is making progress.  Will see patient again in 4 weeks.  Sierah Lacewell G Leoncio Hansen, LCSW

## 2022-08-24 ENCOUNTER — Encounter: Payer: Self-pay | Admitting: Cardiology

## 2022-09-13 ENCOUNTER — Encounter: Payer: Self-pay | Admitting: Cardiology

## 2022-09-13 ENCOUNTER — Ambulatory Visit: Payer: BC Managed Care – PPO | Attending: Cardiology | Admitting: Cardiology

## 2022-09-13 VITALS — BP 108/73 | HR 105 | Ht 64.0 in | Wt 180.0 lb

## 2022-09-13 DIAGNOSIS — E669 Obesity, unspecified: Secondary | ICD-10-CM | POA: Diagnosis not present

## 2022-09-13 DIAGNOSIS — I471 Supraventricular tachycardia, unspecified: Secondary | ICD-10-CM | POA: Diagnosis not present

## 2022-09-13 DIAGNOSIS — Z6830 Body mass index (BMI) 30.0-30.9, adult: Secondary | ICD-10-CM

## 2022-09-13 DIAGNOSIS — E7849 Other hyperlipidemia: Secondary | ICD-10-CM

## 2022-09-13 MED ORDER — METOPROLOL SUCCINATE ER 25 MG PO TB24: 25.0000 mg | ORAL_TABLET | Freq: Two times a day (BID) | ORAL | 3 refills | Status: DC

## 2022-09-13 NOTE — Patient Instructions (Addendum)
Medication Instructions:  Your physician has recommended you make the following change in your medication:  START: Metoprolol succinate (Toprol-XL) 25 mg twice daily *If you need a refill on your cardiac medications before your next appointment, please call your pharmacy*   Lab Work: None   Testing/Procedures: None   Follow-Up: At [facility], you and your health needs are our priority.  As part of our continuing mission to provide you with exceptional heart care, we have created designated Provider Care Teams.  These Care Teams include your primary Cardiologist (physician) and Advanced Practice Providers (APPs -  Physician Assistants and Nurse Practitioners) who all work together to provide you with the care you need, when you need it.    Your next appointment:   6 month(s)  Provider:   Thomasene Ripple, DO

## 2022-09-16 NOTE — Progress Notes (Signed)
Virtual Visit via Video Note   Because of Markeshia Manbeck Bullen's co-morbid illnesses, she is at least at moderate risk for complications without adequate follow up.  This format is felt to be most appropriate for this patient at this time.  All issues noted in this document were discussed and addressed.  A limited physical exam was performed with this format.  Please refer to the patient's chart for her consent to telehealth for [facility].      Date:  09/16/2022   ID:  Michelle Piper, DOB 1976/01/09, MRN [id number]  Patient Location: Home Provider Location: Office/Clinic  Virtual Visit via Video  Note . I connected with the patient today by a   video enabled telemedicine application and verified that I am speaking with the correct person using two identifiers.  PCP:  Sheliah Hatch, MD  Cardiologist:  Thomasene Ripple, DO  Electrophysiologist:  None   Evaluation Performed:  Follow-Up Visit  Chief Complaint:  " I am feeling much better"  History of Present Illness:    ARIADNA BECTON is a 47 y.o. female with  hx of COVID-19 infection and long COVID syndrome with autonomic dysfunction recently started on metoprolol due to persistent tachycardia, obesity, fibromyalgia, migraine headaches with vertigo, familial hyperlipidemia.   I saw the patient on 09/27/2020 at that time she was experiencing palpitations. She had been started on beta blockers by her pcp. During her last visit we increased her toprol xl to 50 mg daily.  An echo was also ordered.   At her visit on November 11, 2020 we adjusted her beta-blocker.  Since that time she had stopped the nighttime beta-blocker and only takes the Toprol 50 mg daily.   At her last visit she was doing well from a CV standpoint no medication changes were made. She was going through other social stressors.   Since I saw that patient she requested to be seen.  At the time of her call to the office she tells me that she was experiencing  significant elevated blood pressure and some increasing heart rates.  Now that she is felt better she thinks that she had COVID-19 and this was amplifying the symptoms.  She notes that all of the symptoms has resolved pretty much.  In she is happy about this.  The patient does not have symptoms concerning for COVID-19 infection (fever, chills, cough, or new shortness of breath).    Past Medical History:  Diagnosis Date   ADD (attention deficit disorder)    Allergy    sseasonal   Anemia    as teenager   Anxiety    Autonomic instability    Back pain    Chest pain    Chewing difficulty    Chronic fatigue    COVID-19 long hauler    Depression    Diastolic dysfunction 2022   Edema of both lower extremities    Fibroid    Fibromyalgia    Food allergy    GERD (gastroesophageal reflux disease)    Heartburn in pregnancy    History of fibromyalgia    Hypertension    IBS (irritable bowel syndrome)    Joint pain    Lactose intolerance    Migraines    Neuromuscular disorder (HCC)    fibromyalgia   Other hyperlipidemia    Pneumonia 05/2020   Postpartum care following cesarean delivery 03/17/2012   Preterm labor    SOB (shortness of breath)    Status post cesarean  delivery (repeat x 2) 03/17/2012   Swallowing difficulty    Urinary tract infection    Vitamin D deficiency    Past Surgical History:  Procedure Laterality Date   abnormal cells in colon/tatoo area     2012   CESAREAN SECTION  2009   CESAREAN SECTION  03/17/2012   Procedure: CESAREAN SECTION;  Surgeon: Serita Kyle, MD;  Location: [facility];  Service: Obstetrics;  Laterality: N/A;  Repeat C/S.  EDD: 03/23/12   COLONOSCOPY     DILATION AND CURETTAGE OF UTERUS     POLYPECTOMY     tubes in ears     WISDOM TOOTH EXTRACTION       Current Meds  Medication Sig   acetaminophen (TYLENOL) 325 MG tablet Take 650 mg by mouth every 6 (six) hours as needed for moderate pain.   albuterol (VENTOLIN HFA) 108 (90 Base)  MCG/ACT inhaler Inhale 1 puff into the lungs as needed for wheezing or shortness of breath.   amphetamine-dextroamphetamine (ADDERALL XR) 10 MG 24 hr capsule Take 2 capsules (20 mg total) by mouth daily.   Ascorbic Acid (VITAMIN C) 1000 MG tablet Take 1,000 mg by mouth daily.   azelastine (ASTELIN) 0.1 % nasal spray Place 2 sprays into both nostrils 2 (two) times daily.   BIOTIN PO Take 200 mg by mouth daily.   Budesonide-Formoterol Fumarate (SYMBICORT IN) Inhale 1 puff into the lungs as needed (shortness of breath).   Calcium Carbonate (CALCIUM 500 PO) Take 500 mg by mouth daily.   chlorpheniramine (CHLOR-TRIMETON) 4 MG tablet Take 4 mg by mouth every 4 (four) hours as needed. 2AT BEDTIME   Cholecalciferol (VITAMIN D) 50 MCG (2000 UT) CAPS Take 2,000 Units by mouth daily.   EPINEPHrine 0.3 mg/0.3 mL IJ SOAJ injection Inject 0.3 mLs (0.3 mg total) into the muscle as needed for anaphylaxis.   ibuprofen (ADVIL) 200 MG tablet Take 400 mg by mouth as needed for moderate pain.   medroxyPROGESTERone (DEPO-PROVERA) 150 MG/ML injection SMARTSIG:1 Milliliter(s) IM Every 12 Weeks   metoprolol succinate (TOPROL XL) 25 MG 24 hr tablet Take 1 tablet (25 mg total) by mouth in the morning and at bedtime.   MULTIPLE VITAMIN PO Take 1 capsule by mouth daily.   pantoprazole (PROTONIX) 40 MG tablet Take one tablet daily   Probiotic Product (PROBIOTIC-10 PO) Take by mouth.   traZODone (DESYREL) 50 MG tablet TAKE 1/2 TO 1 TABLET BY MOUTH AT BEDTIME AS NEEDED FOR SLEEP   triamcinolone (NASACORT) 55 MCG/ACT AERO nasal inhaler Place 2 sprays into the nose daily.   vitamin B-12 (CYANOCOBALAMIN) 500 MCG tablet Take 500 mcg by mouth daily.   Zinc 30 MG TABS Take 30 mg by mouth daily.   [DISCONTINUED] metoprolol succinate (TOPROL-XL) 50 MG 24 hr tablet TAKE 1 TABLET BY MOUTH EVERY DAY WITH OR IMMEDIATELY FOLLOWING A MEAL     Allergies:   Aspirin, Citrus, Codeine, Egg-derived products, Erythromycin, Lactose intolerance  (gi), Penicillins, and Sulfonamide derivatives   Social History   Tobacco Use   Smoking status: Former    Packs/day: 1.00    Years: 11.00    Additional pack years: 0.00    Total pack years: 11.00    Types: Cigarettes    Quit date: 05/07/1997    Years since quitting: 25.3   Smokeless tobacco: Never   Tobacco comments:    no E cigs  Vaping Use   Vaping Use: Never used  Substance Use Topics   Alcohol use:  No    Alcohol/week: 0.0 standard drinks of alcohol   Drug use: No     Family Hx: The patient's family history includes Allergic rhinitis in her daughter; Anxiety disorder in her daughter, daughter, and daughter; Asthma in her daughter; Colon cancer in her paternal grandmother; Colon cancer (age of onset: 79) in her father; Colon polyps in her maternal grandfather and maternal grandmother; Colon polyps (age of onset: 77) in her father; Depression in her mother; Diabetes in her father, maternal aunt, maternal grandfather, maternal grandmother, mother, and another family member; Eczema in her daughter; Food Allergy in her father; Heart attack in her father and another family member; Heart disease in her maternal grandfather and paternal grandfather; Heart failure in an other family member; High Cholesterol in her father and mother; Hyperlipidemia in her father and mother; Hypertension in her father; Lactose intolerance in her daughter, daughter, and daughter; Non-Hodgkin's lymphoma (age of onset: 57) in her father; OCD in her daughter; Stroke in an other family member. There is no history of Angioedema, Immunodeficiency, Esophageal cancer, Stomach cancer, or Rectal cancer.  ROS:   Please see the history of present illness.     All other systems reviewed and are negative.   Prior CV studies:   The following studies were reviewed today: TTE 11/04/20 IMPRESSIONS     1. Left ventricular ejection fraction, by estimation, is 55 to 60%. The  left ventricle has normal function. The left  ventricle has no regional  wall motion abnormalities. Left ventricular diastolic parameters are  consistent with Grade I diastolic  dysfunction (impaired relaxation).   2. Right ventricular systolic function is normal. The right ventricular  size is normal. There is normal pulmonary artery systolic pressure.   3. The mitral valve is normal in structure. No evidence of mitral valve  regurgitation. No evidence of mitral stenosis.   4. The aortic valve is normal in structure. Aortic valve regurgitation is  not visualized. No aortic stenosis is present.   5. The inferior vena cava is normal in size with greater than 50%  respiratory variability, suggesting right atrial pressure of 3 mmHg.   FINDINGS   Left Ventricle: Left ventricular ejection fraction, by estimation, is 55  to 60%. The left ventricle has normal function. The left ventricle has no  regional wall motion abnormalities. The left ventricular internal cavity  size was normal in size. There is   no left ventricular hypertrophy. Left ventricular diastolic parameters  are consistent with Grade I diastolic dysfunction (impaired relaxation).   Right Ventricle: The right ventricular size is normal. No increase in  right ventricular wall thickness. Right ventricular systolic function is  normal. There is normal pulmonary artery systolic pressure. The tricuspid  regurgitant velocity is 2.05 m/s, and   with an assumed right atrial pressure of 3 mmHg, the estimated right  ventricular systolic pressure is 19.8 mmHg.   Left Atrium: Left atrial size was normal in size.   Right Atrium: Right atrial size was normal in size.   Pericardium: There is no evidence of pericardial effusion.   Mitral Valve: The mitral valve is normal in structure. No evidence of  mitral valve regurgitation. No evidence of mitral valve stenosis. MV peak  gradient, 4.2 mmHg. The mean mitral valve gradient is 2.0 mmHg.   Tricuspid Valve: The tricuspid valve is  normal in structure. Tricuspid  valve regurgitation is mild . No evidence of tricuspid stenosis.   Aortic Valve: The aortic valve is normal in structure. Aortic  valve  regurgitation is not visualized. No aortic stenosis is present. Aortic  valve mean gradient measures 4.0 mmHg. Aortic valve peak gradient measures  8.2 mmHg. Aortic valve area, by VTI  measures 2.06 cm.   Pulmonic Valve: The pulmonic valve was normal in structure. Pulmonic valve  regurgitation is not visualized. No evidence of pulmonic stenosis.   Aorta: The aortic root is normal in size and structure.   Venous: The inferior vena cava is normal in size with greater than 50%  respiratory variability, suggesting right atrial pressure of 3 mmHg.   IAS/Shunts: No atrial level shunt detected by color flow Doppler.     Labs/Other Tests and Data Reviewed:    EKG:  No ECG reviewed.  Recent Labs: 07/16/2022: ALT 12; BUN 14; Creatinine, Ser 0.76; Hemoglobin 14.3; Platelets 256.0; Potassium 3.7; Sodium 138; TSH 1.88   Recent Lipid Panel Lab Results  Component Value Date/Time   CHOL 160 07/16/2022 08:35 AM   CHOL 161 12/28/2021 08:10 AM   TRIG 125.0 07/16/2022 08:35 AM   HDL 35.60 (L) 07/16/2022 08:35 AM   HDL 39 (L) 12/28/2021 08:10 AM   CHOLHDL 4 07/16/2022 08:35 AM   LDLCALC 99 07/16/2022 08:35 AM   LDLCALC 103 (H) 12/28/2021 08:10 AM    Wt Readings from Last 3 Encounters:  09/13/22 180 lb (81.6 kg)  07/16/22 187 lb (84.8 kg)  03/08/22 175 lb (79.4 kg)     Objective:    Vital Signs:  BP 108/73   Pulse (!) 105   Ht 5\' 4"  (1.626 m)   Wt 180 lb (81.6 kg)   BMI 30.90 kg/m      ASSESSMENT & PLAN:    PSVT Obesity Hyperlipidemia   Thankfully she is doing better. Stable from a CV standpoint. We will keep her on the toprol but change dose to 25 mg twice a day.     COVID-19 Education: The signs and symptoms of COVID-19 were discussed with the patient and how to seek care for testing (follow up with  PCP or arrange E-visit).  The importance of social distancing was discussed today.  Time:   Today, I have spent 12 minutes with the patient with telehealth technology discussing the above problems.     Medication Adjustments/Labs and Tests Ordered: Current medicines are reviewed at length with the patient today.  Concerns regarding medicines are outlined above.   Tests Ordered: No orders of the defined types were placed in this encounter.   Medication Changes: Meds ordered this encounter  Medications   metoprolol succinate (TOPROL XL) 25 MG 24 hr tablet    Sig: Take 1 tablet (25 mg total) by mouth in the morning and at bedtime.    Dispense:  180 tablet    Refill:  3    Follow Up:  In Person   Osvaldo Shipper, DO  09/16/2022 8:56 PM    [facility]

## 2022-09-19 ENCOUNTER — Ambulatory Visit (INDEPENDENT_AMBULATORY_CARE_PROVIDER_SITE_OTHER): Payer: BC Managed Care – PPO | Admitting: Psychology

## 2022-09-19 DIAGNOSIS — F4310 Post-traumatic stress disorder, unspecified: Secondary | ICD-10-CM | POA: Diagnosis not present
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
## 2022-10-03 ENCOUNTER — Other Ambulatory Visit: Payer: Self-pay | Admitting: Family Medicine

## 2022-10-03 DIAGNOSIS — F988 Other specified behavioral and emotional disorders with onset usually occurring in childhood and adolescence: Secondary | ICD-10-CM

## 2022-10-03 MED ORDER — AMPHETAMINE-DEXTROAMPHET ER 10 MG PO CP24: 20.0000 mg | ORAL_CAPSULE | Freq: Every day | ORAL | 0 refills | Status: DC

## 2022-10-03 NOTE — Telephone Encounter (Signed)
Adderall XR 10 mg LOV: 07/16/22 Last Refill:07/16/22 Upcoming appt: 07/17/23

## 2022-10-04 NOTE — Telephone Encounter (Signed)
Pt aware of refill.

## 2022-10-15 ENCOUNTER — Encounter: Payer: Self-pay | Admitting: Family Medicine

## 2022-10-17 ENCOUNTER — Ambulatory Visit (INDEPENDENT_AMBULATORY_CARE_PROVIDER_SITE_OTHER): Payer: BC Managed Care – PPO | Admitting: Psychology

## 2022-10-17 DIAGNOSIS — F4310 Post-traumatic stress disorder, unspecified: Secondary | ICD-10-CM | POA: Diagnosis not present
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
## 2022-11-14 ENCOUNTER — Ambulatory Visit (INDEPENDENT_AMBULATORY_CARE_PROVIDER_SITE_OTHER): Payer: BC Managed Care – PPO | Admitting: Psychology

## 2022-11-14 DIAGNOSIS — F4310 Post-traumatic stress disorder, unspecified: Secondary | ICD-10-CM | POA: Diagnosis not present

## 2022-11-15 NOTE — Progress Notes (Signed)
[facility] Counselor/Therapist Progress Note  Patient ID: Abigail Singh, MRN: [id number],    Date: 11/14/2022  Time Spent: 60 minutes  Time in:  4:00 Time out:  5:00  Treatment Type: Individual Therapy  Reported Symptoms: Hx of flashbacks, worry, depression, history of hypervigilance.  Mental Status Exam: Appearance:  Casual     Behavior: Appropriate  Motor: Normal  Speech/Language:  Normal Rate  Affect: Blunt  Mood: pleasant  Thought process: normal  Thought content:   WNL  Sensory/Perceptual disturbances:   WNL  Orientation: oriented to person, place, time/date, and situation  Attention: Good  Concentration: Good  Memory: WNL  Fund of knowledge:  Good  Insight:   Good  Judgment:  Good  Impulse Control: Good   Risk Assessment: Danger to Self:  No Self-injurious Behavior: No Danger to Others: No Duty to Warn:no Physical Aggression / Violence:No  Access to Firearms a concern: No  Gang Involvement:No   Subjective: The patient attended a face-to-face individual therapy session in the office today.  The patient presents with a blunted affect and mood is pleasant.  The patient does present as somewhat stressed.  We talked about her stress level with her 2 older daughters.  Ivor Messier is at camp at [employer] and apparently she is calling her and sending her messages frequently because she wants to leave camp.  We talked about some parenting skills and talked about the need for her to encourage her daughter to stay as I think sometimes her daughters pick up on her own anxiety.  The patient also talked about her other daughter doing better with her identity and how she is handling those things.  The patient has a lot of stress in her life and I encouraged her to do some self-care activities.  We talked about the need for her to do a potential girl's trip where she could go with someone who is likeminded and just have a weekend to herself as she does a lot for her  family.  Interventions: Cognitive Behavioral Therapy, Insight-Oriented, and Interpersonal  Diagnosis:PTSD (post-traumatic stress disorder)  Plan: Client Abilities/Strengths  insightful, motivated, intelligent  Client Treatment Preferences  Outpatient Individual therapy and EMDR  Client Statement of Needs  "Dr. Dellia Cloud sent me to have EMDR for my trauma"  Treatment Level  Outpatient Individual therapy  Symptoms  Demonstrates an exaggerated startle response.: (Status: improved). Describes a reliving of the event, particularly through dissociative flashbacks.: (Status:  improved). Displays a significant decline in interest and engagement in activities.:  (Status: improved). Displays significant psychological and/or physiological distress resulting  from internal and external clues that are reminiscent of the traumatic event.:  (Status: improved). Experiences disturbing and persistent thoughts, images, and/or perceptions of the  traumatic event.: N(Status: improved). Has been exposed to a traumatic event  involving actual or perceived threat of death or serious injury.: (Status:  maintained). Impairment in social, occupational, or other areas of functioning.: (Status: improved). Intentionally avoids activities, places, people, or objects (e.g., up-armored vehicles) that evoke memories of the event.:  (Status: improved). Intentionally avoids  thoughts, feelings, or discussions related to the traumatic event.: (Status:  improved). Reports difficulty concentrating as well as feelings of guilt.:  (Status: improved). Reports hypervigilance.: N (Status: improved). Reports  response of intense fear, helplessness, or horror to the traumatic event.:  (Status: improved). Symptoms present more than one month.: (Status: improved).  Problems Addressed  Posttraumatic Stress Disorder (PTSD),  Goals 1. Eliminate or reduce the negative impact trauma  related symptoms have  on social, occupational, and  family functioning. Objective Participate in Eye Movement Desensitization and Reprocessing (EMDR) to reduce emotional distress  related to traumatic thoughts, feelings, and images. Target Date: 2023-05-23 Frequency: Biweekly Progress: 90 Modality: individual Related Interventions 1. Utilize Eye Movement Desensitization and Reprocessing (EMDR) to reduce the client's  emotional reactivity to the traumatic event and reduce PTSD symptoms. 2. No longer avoids persons, places, activities, and objects that are  reminiscent of the traumatic event. 3. No longer experiences intrusive event recollections, avoidance of event  reminders, intense arousal, or disinterest in activities or  relationships. 4. Returns to the level of psychological functioning prior to exposure to  the traumatic event. 5. Thinks about or openly discusses the traumatic event with others  without experiencing psychological or physiological distress. Diagnosis F43.10 (Posttraumatic stress disorder) - Open - [Signifier: n/a] Posttraumatic Stress  Disorder  Conditions For Discharge Achievement of treatment goals and objectives. The patient has approved this plan and is making progress.  Will see patient again in 4 weeks.  Adrain Nesbit G Georgia Delsignore, LCSW

## 2022-11-30 ENCOUNTER — Other Ambulatory Visit: Payer: Self-pay | Admitting: Family Medicine

## 2022-11-30 DIAGNOSIS — F988 Other specified behavioral and emotional disorders with onset usually occurring in childhood and adolescence: Secondary | ICD-10-CM

## 2022-11-30 MED ORDER — AMPHETAMINE-DEXTROAMPHET ER 10 MG PO CP24: 20.0000 mg | ORAL_CAPSULE | Freq: Every day | ORAL | 0 refills | Status: DC

## 2022-11-30 NOTE — Telephone Encounter (Signed)
Patient is requesting a refill of the following medications: Requested Prescriptions   Pending Prescriptions Disp Refills   amphetamine-dextroamphetamine (ADDERALL XR) 10 MG 24 hr capsule 60 capsule 0    Sig: Take 2 capsules (20 mg total) by mouth daily.    Date of patient request: 11/30/22 Last office visit: 07/16/22 Date of last refill: 10/03/22 Last refill amount: 60 Follow up time period per chart: 1 year

## 2022-12-05 ENCOUNTER — Ambulatory Visit (INDEPENDENT_AMBULATORY_CARE_PROVIDER_SITE_OTHER): Payer: BC Managed Care – PPO | Admitting: Psychology

## 2022-12-05 DIAGNOSIS — F4310 Post-traumatic stress disorder, unspecified: Secondary | ICD-10-CM | POA: Diagnosis not present

## 2022-12-06 NOTE — Progress Notes (Signed)
[facility] Counselor/Therapist Progress Note  Patient ID: CHANICE SNOKE, MRN: [id number],    Date: 12/05/2022  Time Spent: 60 minutes  Time in:  4:00 Time out:  5:00  Treatment Type: Individual Therapy  Reported Symptoms: Hx of flashbacks, worry, depression, history of hypervigilance.  Mental Status Exam: Appearance:  Casual     Behavior: Appropriate  Motor: Normal  Speech/Language:  Normal Rate  Affect: Blunt  Mood: pleasant  Thought process: normal  Thought content:   WNL  Sensory/Perceptual disturbances:   WNL  Orientation: oriented to person, place, time/date, and situation  Attention: Good  Concentration: Good  Memory: WNL  Fund of knowledge:  Good  Insight:   Good  Judgment:  Good  Impulse Control: Good   Risk Assessment: Danger to Self:  No Self-injurious Behavior: No Danger to Others: No Duty to Warn:no Physical Aggression / Violence:No  Access to Firearms a concern: No  Gang Involvement:No   Subjective: The patient attended a face-to-face individual therapy session in the office today.  The patient presents with a blunted affect and mood is pleasant.  The patient reports that things are going okay.  She states that her girls of course are her  major concern we talked about the need for her to do things that are good for her and she has been doing some crocheting and doing some art and taking walks.  I encouraged her to do as much of that as she can.  She and her family are going next week to the beach with her parents for their yearly trip.  We talked about how she can have some time for herself next week because she gets very anxious when worrying about her daughters.  Provided problem solving and some cognitive behavioral therapy today.  Interventions: Cognitive Behavioral Therapy, Insight-Oriented, and Interpersonal  Diagnosis:PTSD (post-traumatic stress disorder)  Plan: Client Abilities/Strengths  insightful, motivated, intelligent   Client Treatment Preferences  Outpatient Individual therapy and EMDR  Client Statement of Needs  "Dr. Dellia Cloud sent me to have EMDR for my trauma"  Treatment Level  Outpatient Individual therapy  Symptoms  Demonstrates an exaggerated startle response.: (Status: improved). Describes a reliving of the event, particularly through dissociative flashbacks.: (Status:  improved). Displays a significant decline in interest and engagement in activities.:  (Status: improved). Displays significant psychological and/or physiological distress resulting  from internal and external clues that are reminiscent of the traumatic event.:  (Status: improved). Experiences disturbing and persistent thoughts, images, and/or perceptions of the  traumatic event.: N(Status: improved). Has been exposed to a traumatic event  involving actual or perceived threat of death or serious injury.: (Status:  maintained). Impairment in social, occupational, or other areas of functioning.: (Status: improved). Intentionally avoids activities, places, people, or objects (e.g., up-armored vehicles) that evoke memories of the event.:  (Status: improved). Intentionally avoids  thoughts, feelings, or discussions related to the traumatic event.: (Status:  improved). Reports difficulty concentrating as well as feelings of guilt.:  (Status: improved). Reports hypervigilance.: N (Status: improved). Reports  response of intense fear, helplessness, or horror to the traumatic event.:  (Status: improved). Symptoms present more than one month.: (Status: improved).  Problems Addressed  Posttraumatic Stress Disorder (PTSD),  Goals 1. Eliminate or reduce the negative impact trauma related symptoms have  on social, occupational, and family functioning. Objective Participate in Eye Movement Desensitization and Reprocessing (EMDR) to reduce emotional distress  related to traumatic thoughts, feelings, and images. Target Date: 2023-05-23  Frequency: Biweekly Progress: 90  Modality: individual Related Interventions 1. Utilize Eye Movement Desensitization and Reprocessing (EMDR) to reduce the client's  emotional reactivity to the traumatic event and reduce PTSD symptoms. 2. No longer avoids persons, places, activities, and objects that are  reminiscent of the traumatic event. 3. No longer experiences intrusive event recollections, avoidance of event  reminders, intense arousal, or disinterest in activities or  relationships. 4. Returns to the level of psychological functioning prior to exposure to  the traumatic event. 5. Thinks about or openly discusses the traumatic event with others  without experiencing psychological or physiological distress. Diagnosis F43.10 (Posttraumatic stress disorder) - Open - [Signifier: n/a] Posttraumatic Stress  Disorder  Conditions For Discharge Achievement of treatment goals and objectives. The patient has approved this plan and is making progress.  Will see patient again in 4 weeks.  Cydney Alvarenga G Samanthajo Payano, LCSW

## 2022-12-12 ENCOUNTER — Ambulatory Visit: Payer: BC Managed Care – PPO | Admitting: Psychology

## 2023-01-09 ENCOUNTER — Ambulatory Visit (INDEPENDENT_AMBULATORY_CARE_PROVIDER_SITE_OTHER): Payer: BC Managed Care – PPO | Admitting: Psychology

## 2023-01-09 DIAGNOSIS — F4310 Post-traumatic stress disorder, unspecified: Secondary | ICD-10-CM

## 2023-01-09 NOTE — Progress Notes (Signed)
[facility] Counselor/Therapist Progress Note  Patient ID: Abigail Singh, MRN: [id number],    Date: 01/09/2023  Time Spent: 60 minutes  Time in:  4:00 Time out:  5:00  Treatment Type: Individual Therapy  Reported Symptoms: Hx of flashbacks, worry, depression, history of hypervigilance.  Mental Status Exam: Appearance:  Casual     Behavior: Appropriate  Motor: Normal  Speech/Language:  Normal Rate  Affect: Blunt  Mood: pleasant  Thought process: normal  Thought content:   WNL  Sensory/Perceptual disturbances:   WNL  Orientation: oriented to person, place, time/date, and situation  Attention: Good  Concentration: Good  Memory: WNL  Fund of knowledge:  Good  Insight:   Good  Judgment:  Good  Impulse Control: Good   Risk Assessment: Danger to Self:  No Self-injurious Behavior: No Danger to Others: No Duty to Warn:no Physical Aggression / Violence:No  Access to Firearms a concern: No  Gang Involvement:No   Subjective: The patient attended a face-to-face individual therapy session in the office today.  The patient presents with a blunted affect and mood is pleasant.  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  I explained to her what splitting was and about personality disorders.  The patient reported that she felt so much better after we had this discussion and she had the education because she felt like she was always trying to make the other person happy when they were there.  The other person has since left and I explained to the patient that she does not necessarily need to continue a relationship with this person as the person seems to have caused some issues for her at her place of employment.  The patient seems to be doing  very well and she felt like today's session was very helpful. Interventions: Cognitive Behavioral Therapy, Insight-Oriented, and Interpersonal  Diagnosis:PTSD (post-traumatic stress disorder)  Plan: Client Abilities/Strengths  insightful, motivated, intelligent  Client Treatment Preferences  Outpatient Individual therapy and EMDR  Client Statement of Needs  "Dr. Dellia Cloud sent me to have EMDR for my trauma"  Treatment Level  Outpatient Individual therapy  Symptoms  Demonstrates an exaggerated startle response.: (Status: improved). Describes a reliving of the event, particularly through dissociative flashbacks.: (Status:  improved). Displays a significant decline in interest and engagement in activities.:  (Status: improved). Displays significant psychological and/or physiological distress resulting  from internal and external clues that are reminiscent of the traumatic event.:  (Status: improved). Experiences disturbing and persistent thoughts, images, and/or perceptions of the  traumatic event.: N(Status: improved). Has been exposed to a traumatic event  involving actual or perceived threat of death or serious injury.: (Status:  maintained). Impairment in social, occupational, or other areas of functioning.: (Status: improved). Intentionally avoids activities, places, people, or objects (e.g., up-armored vehicles) that evoke memories of the event.:  (Status: improved). Intentionally avoids  thoughts, feelings, or discussions related to the traumatic event.: (Status:  improved). Reports difficulty concentrating as well as feelings of guilt.:  (Status: improved). Reports hypervigilance.: N (Status: improved). Reports  response of intense fear, helplessness, or horror to the traumatic event.:  (Status: improved). Symptoms present more than one month.: (Status: improved).  Problems Addressed  Posttraumatic Stress Disorder (PTSD),  Goals 1. Eliminate  or reduce the negative impact trauma  related symptoms have  on social, occupational, and family functioning. Objective Participate in Eye Movement Desensitization and Reprocessing (EMDR) to reduce emotional distress  related to traumatic thoughts, feelings, and images. Target Date: 2023-05-23 Frequency: Biweekly Progress: 90 Modality: individual Related Interventions 1. Utilize Eye Movement Desensitization and Reprocessing (EMDR) to reduce the client's  emotional reactivity to the traumatic event and reduce PTSD symptoms. 2. No longer avoids persons, places, activities, and objects that are  reminiscent of the traumatic event. 3. No longer experiences intrusive event recollections, avoidance of event  reminders, intense arousal, or disinterest in activities or  relationships. 4. Returns to the level of psychological functioning prior to exposure to  the traumatic event. 5. Thinks about or openly discusses the traumatic event with others  without experiencing psychological or physiological distress. Diagnosis F43.10 (Posttraumatic stress disorder) - Open - [Signifier: n/a] Posttraumatic Stress  Disorder  Conditions For Discharge Achievement of treatment goals and objectives. The patient has approved this plan and is making progress.  Will see patient again in 4 weeks.  Lilianne Delair G Gerry Heaphy, LCSW

## 2023-01-22 ENCOUNTER — Other Ambulatory Visit: Payer: Self-pay | Admitting: Family Medicine

## 2023-01-29 ENCOUNTER — Other Ambulatory Visit: Payer: Self-pay | Admitting: Family Medicine

## 2023-01-29 DIAGNOSIS — F988 Other specified behavioral and emotional disorders with onset usually occurring in childhood and adolescence: Secondary | ICD-10-CM

## 2023-01-29 NOTE — Telephone Encounter (Signed)
Patient is requesting a refill of the following medications: Requested Prescriptions   Pending Prescriptions Disp Refills   amphetamine-dextroamphetamine (ADDERALL XR) 10 MG 24 hr capsule 60 capsule 0    Sig: Take 2 capsules (20 mg total) by mouth daily.    Date of patient request: 01/29/23 Last office visit: 07/16/22 Date of last refill: 11/30/22 Last refill amount: 60 Follow up time period per chart: 1 year

## 2023-01-30 MED ORDER — AMPHETAMINE-DEXTROAMPHET ER 10 MG PO CP24: 20.0000 mg | ORAL_CAPSULE | Freq: Every day | ORAL | 0 refills | Status: DC

## 2023-02-01 ENCOUNTER — Other Ambulatory Visit: Payer: Self-pay | Admitting: Cardiology

## 2023-02-02 ENCOUNTER — Emergency Department (HOSPITAL_BASED_OUTPATIENT_CLINIC_OR_DEPARTMENT_OTHER): Payer: BC Managed Care – PPO | Admitting: Radiology

## 2023-02-02 ENCOUNTER — Emergency Department (HOSPITAL_BASED_OUTPATIENT_CLINIC_OR_DEPARTMENT_OTHER)
Admission: EM | Admit: 2023-02-02 | Discharge: 2023-02-02 | Disposition: A | Discharge status: Home / Self Care | Payer: BC Managed Care – PPO | Attending: Emergency Medicine | Admitting: Emergency Medicine

## 2023-02-02 ENCOUNTER — Encounter (HOSPITAL_BASED_OUTPATIENT_CLINIC_OR_DEPARTMENT_OTHER): Payer: Self-pay | Admitting: Emergency Medicine

## 2023-02-02 ENCOUNTER — Other Ambulatory Visit: Payer: Self-pay

## 2023-02-02 DIAGNOSIS — Y9301 Activity, walking, marching and hiking: Secondary | ICD-10-CM | POA: Insufficient documentation

## 2023-02-02 DIAGNOSIS — S80211A Abrasion, right knee, initial encounter: Secondary | ICD-10-CM | POA: Diagnosis not present

## 2023-02-02 DIAGNOSIS — S40012A Contusion of left shoulder, initial encounter: Secondary | ICD-10-CM | POA: Insufficient documentation

## 2023-02-02 DIAGNOSIS — W108XXA Fall (on) (from) other stairs and steps, initial encounter: Secondary | ICD-10-CM | POA: Insufficient documentation

## 2023-02-02 DIAGNOSIS — M25512 Pain in left shoulder: Secondary | ICD-10-CM | POA: Diagnosis present

## 2023-02-02 DIAGNOSIS — W19XXXA Unspecified fall, initial encounter: Secondary | ICD-10-CM

## 2023-02-02 NOTE — ED Notes (Signed)
Dc instructions reviewed with patient. Patient voiced understanding. Dc with belongings.  °

## 2023-02-02 NOTE — Discharge Instructions (Signed)
Please read and follow all provided instructions.  Your diagnoses today include:  1. Contusion of left shoulder, initial encounter   2. Abrasion of right knee, initial encounter   3. Fall, initial encounter     Tests performed today include: An x-ray of the affected area - does NOT show any broken bones Vital signs. See below for your results today.   Medications prescribed:  Please use over-the-counter NSAID medications (ibuprofen, naproxen) or Tylenol (acetaminophen) as directed on the packaging for pain -- as long as you do not have any reasons avoid these medications. Reasons to avoid NSAID medications include: weak kidneys, a history of bleeding in your stomach or gut, or uncontrolled high blood pressure or previous heart attack. Reasons to avoid Tylenol include: liver problems or ongoing alcohol use. Never take more than 4000mg  or 8 Extra strength Tylenol in a 24 hour period.     Take any prescribed medications only as directed.  Home care instructions:  Follow any educational materials contained in this packet Follow R.I.C.E. Protocol: R - rest your injury  I  - use ice on injury without applying directly to skin C - compress injury with bandage or splint E - elevate the injury as much as possible  Follow-up instructions: Please follow-up with your primary care provider or the provided orthopedic physician (bone specialist) if you continue to have significant pain in 1 week. In this case you may have a more severe injury that requires further care.   Return instructions:  Please return if your fingers are numb or tingling, appear gray or blue, or you have severe pain (also elevate the arm and loosen splint or wrap if you were given one) Please return to the Emergency Department if you experience worsening symptoms.  Please return if you have any other emergent concerns.  Additional Information:  Your vital signs today were: BP (!) 146/98   Pulse 88   Temp 97.8 F (36.6  C) (Oral)   Resp 20   Wt 81.6 kg   SpO2 100%   BMI 30.90 kg/m  If your blood pressure (BP) was elevated above 135/85 this visit, please have this repeated by your doctor within one month. --------------

## 2023-02-02 NOTE — ED Triage Notes (Signed)
Pt fell forward, landed on her left shoulder, left arm was extended, she also hit her right knee. Painful when she moves the arm/shoulder

## 2023-02-02 NOTE — ED Provider Notes (Signed)
EMERGENCY DEPARTMENT AT [facility] Provider Note   CSN: [id number] Arrival date & time: 02/02/23  1631     History  No chief complaint on file.   Abigail Singh is a 47 y.o. female.  Patient presents to the emergency department for evaluation of shoulder pain.  She was walking upstairs prior to arrival carrying some objects.  She fell and landed on her left shoulder and right knee.  She did not hit her head or lose consciousness.  She has pain with movement of her left arm.  No distal numbness or tingling.  She has had some soreness in her elbow but not in her wrist.  She is able to bear weight on her right leg without difficulty and thinks that she just sustained an abrasion.  She took ibuprofen prior to arrival.  Pain is worse with movement and palpation.       Home Medications Prior to Admission medications   Medication Sig Start Date End Date Taking? Authorizing Provider  acetaminophen (TYLENOL) 325 MG tablet Take 650 mg by mouth every 6 (six) hours as needed for moderate pain.    [provider]  albuterol (VENTOLIN HFA) 108 (90 Base) MCG/ACT inhaler Inhale 1 puff into the lungs as needed for wheezing or shortness of breath.    [provider]  amphetamine-dextroamphetamine (ADDERALL XR) 10 MG 24 hr capsule Take 2 capsules (20 mg total) by mouth daily. 01/30/23   Sheliah Hatch, MD  Ascorbic Acid (VITAMIN C) 1000 MG tablet Take 1,000 mg by mouth daily.    [provider]  azelastine (ASTELIN) 0.1 % nasal spray Place 2 sprays into both nostrils 2 (two) times daily. 06/23/21   Ardith Dark, MD  BIOTIN PO Take 200 mg by mouth daily.    [provider]  Budesonide-Formoterol Fumarate (SYMBICORT IN) Inhale 1 puff into the lungs as needed (shortness of breath).    [provider]  Calcium Carbonate (CALCIUM 500 PO) Take 500 mg by mouth daily.    [provider]  chlorpheniramine (CHLOR-TRIMETON) 4 MG  tablet Take 4 mg by mouth every 4 (four) hours as needed. 2AT BEDTIME    [provider]  Cholecalciferol (VITAMIN D) 50 MCG (2000 UT) CAPS Take 2,000 Units by mouth daily.    [provider]  EPINEPHrine 0.3 mg/0.3 mL IJ SOAJ injection Inject 0.3 mLs (0.3 mg total) into the muscle as needed for anaphylaxis. 06/09/18   Sheliah Hatch, MD  furosemide (LASIX) 20 MG tablet TAKE 1 TABLET (20 MG TOTAL) BY MOUTH AS NEEDED (BILATERAL LEG SWELLING). 03/26/22 06/24/22  Sheliah Hatch, MD  ibuprofen (ADVIL) 200 MG tablet Take 400 mg by mouth as needed for moderate pain.    [provider]  medroxyPROGESTERone (DEPO-PROVERA) 150 MG/ML injection SMARTSIG:1 Milliliter(s) IM Every 12 Weeks 08/20/20   [provider]  metoprolol succinate (TOPROL XL) 25 MG 24 hr tablet Take 1 tablet (25 mg total) by mouth in the morning and at bedtime. 09/13/22   Tobb, Kardie, DO  MULTIPLE VITAMIN PO Take 1 capsule by mouth daily.    [provider]  pantoprazole (PROTONIX) 40 MG tablet TAKE 1 TABLET DAILY 01/22/23   Sheliah Hatch, MD  Probiotic Product (PROBIOTIC-10 PO) Take by mouth.    [provider]  traZODone (DESYREL) 50 MG tablet TAKE 1/2 TO 1 TABLET BY MOUTH AT BEDTIME AS NEEDED FOR SLEEP 01/09/22   Sheliah Hatch, MD  triamcinolone (  NASACORT) 55 MCG/ACT AERO nasal inhaler Place 2 sprays into the nose daily. 11/24/19   Waldon Merl, PA-C  vitamin B-12 (CYANOCOBALAMIN) 500 MCG tablet Take 500 mcg by mouth daily.    [provider]  Zinc 30 MG TABS Take 30 mg by mouth daily.    [provider]      Allergies    Aspirin, Citrus, Codeine, Egg-derived products, Erythromycin, Lactose intolerance (gi), Penicillins, and Sulfonamide derivatives    Review of Systems   Review of Systems  Physical Exam Updated Vital Signs BP (!) 146/98   Pulse 88   Temp 97.8 F (36.6 C) (Oral)   Resp 20   Wt 81.6 kg   SpO2 100%   BMI 30.90 kg/m   Physical Exam Vitals and nursing note reviewed.  Constitutional:      Appearance: She is well-developed.  HENT:     Head: Normocephalic and atraumatic.  Eyes:     Pupils: Pupils are equal, round, and reactive to light.  Cardiovascular:     Pulses: Normal pulses. No decreased pulses.  Musculoskeletal:        General: Tenderness present.     Left shoulder: Tenderness present. No bony tenderness. Decreased range of motion.     Left elbow: Normal range of motion. Tenderness present.     Left wrist: No tenderness. Normal range of motion.     Cervical back: Normal range of motion and neck supple. No tenderness. Normal range of motion.     Thoracic back: No bony tenderness. Normal range of motion.     Right knee: Normal range of motion. Tenderness present. No medial joint line tenderness.     Right foot: Normal range of motion. No tenderness or bony tenderness.     Left foot: Normal range of motion. No tenderness or bony tenderness.       Legs:  Skin:    General: Skin is warm and dry.  Neurological:     Mental Status: She is alert.     Sensory: No sensory deficit.     Comments: Motor, sensation, and vascular distal to the injury is fully intact.   Psychiatric:        Mood and Affect: Mood normal.     ED Results / Procedures / Treatments   Labs (all labs ordered are listed, but only abnormal results are displayed) Labs Reviewed - No data to display  EKG None  Radiology DG Shoulder Left  Result Date: 02/02/2023 CLINICAL DATA:  Fall and left shoulder pain. EXAM: LEFT SHOULDER - 2+ VIEW COMPARISON:  None Available. FINDINGS: There is no evidence of fracture or dislocation. There is no evidence of arthropathy or other focal bone abnormality. Soft tissues are unremarkable. IMPRESSION: Negative. Electronically Signed   By: Elgie Collard M.D.   On: 02/02/2023 18:21    Procedures Procedures    Medications Ordered in ED Medications - No data to display  ED Course/ Medical  Decision Making/ A&P    Patient seen and examined. History obtained directly from patient.   Labs/EKG: None ordered  Imaging: Ordered x-ray of the left shoulder.   Medications/Fluids: Offered oral pain medication, patient declines at this time.  Most recent vital signs reviewed and are as follows: BP (!) 146/98   Pulse 88   Temp 97.8 F (36.6 C) (Oral)   Resp 20   Wt 81.6 kg   SpO2 100%   BMI 30.90 kg/m   Initial impression: Left shoulder injury after a fall,  minor injury to the right knee and no concern for fracture in this area.  6:42 PM Reassessment performed. Patient appears stable.  Imaging personally visualized and interpreted including: X-ray of the left shoulder, agree no fracture.  Reviewed pertinent lab work and imaging with patient at bedside. Questions answered.   Most current vital signs reviewed and are as follows: BP (!) 146/98   Pulse 88   Temp 97.8 F (36.6 C) (Oral)   Resp 20   Wt 81.6 kg   SpO2 100%   BMI 30.90 kg/m   Plan: Discharge to home.   Prescriptions written for: None  Other home care instructions discussed: RICE protocol, NSAIDs  ED return instructions discussed: New or worsening symptoms  Follow-up instructions discussed: Patient encouraged to follow-up with orthopedics in 1 week if not improving.  Referral given.  Patient is also in physical therapy for her right elbow.                                 Medical Decision Making Amount and/or Complexity of Data Reviewed Radiology: ordered.   Patient with mechanical fall onto her left shoulder.  X-rays are negative.  Left upper extremity is neurovascularly intact.  Routine care indicated at this point.  No signs of fracture or dislocation.  She also skinned her knee, however this is minor.         Final Clinical Impression(s) / ED Diagnoses Final diagnoses:  Contusion of left shoulder, initial encounter  Abrasion of right knee, initial encounter  Fall, initial encounter     Rx / DC Orders ED Discharge Orders     None         Renne Crigler, PA-C 02/02/23 1843    Vanetta Mulders, MD 02/02/23 2328

## 2023-02-06 ENCOUNTER — Ambulatory Visit: Payer: BC Managed Care – PPO | Admitting: Psychology

## 2023-02-06 DIAGNOSIS — F4310 Post-traumatic stress disorder, unspecified: Secondary | ICD-10-CM | POA: Diagnosis not present

## 2023-02-07 NOTE — Progress Notes (Signed)
[facility] Counselor/Therapist Progress Note  Patient ID: Abigail Singh, MRN: [id number],    Date: 02/06/2023  Time Spent: 60 minutes  Time in:  4:00 Time out:  5:00  Treatment Type: Individual Therapy  Reported Symptoms: Hx of flashbacks, worry, depression, history of hypervigilance.  Mental Status Exam: Appearance:  Casual     Behavior: Appropriate  Motor: Normal  Speech/Language:  Normal Rate  Affect: Blunt  Mood: pleasant  Thought process: normal  Thought content:   WNL  Sensory/Perceptual disturbances:   WNL  Orientation: oriented to person, place, time/date, and situation  Attention: Good  Concentration: Good  Memory: WNL  Fund of knowledge:  Good  Insight:   Good  Judgment:  Good  Impulse Control: Good   Risk Assessment: Danger to Self:  No Self-injurious Behavior: No Danger to Others: No Duty to Warn:no Physical Aggression / Violence:No  Access to Firearms a concern: No  Gang Involvement:No   Subjective: The patient attended a face-to-face individual therapy session in the office today.  The patient presents with a blunted affect and mood is pleasant.  The patient came in in a sling today and reports that she fell and hurt her shoulder and her elbow.  The patient is very impatient about this and we talked about how to work herself into being more patient so that she can recover quicker.  The patient talked about her sadness because she is not quite the same since she had COVID several years ago.  We also talked about what is going on with her family and discussed strategies that she could use to help improve interactions with them.  She talked about her husband still struggling with being yellow at times and I recommended that she either go with him to his doctor's appointment or have him talk with his primary care physician about problems with that as his mother had a rare form of cancer related to a bial duct being blocked.  Interventions:  Cognitive Behavioral Therapy, Insight-Oriented, and Interpersonal, Problem solving  Diagnosis:PTSD (post-traumatic stress disorder)  Plan: Client Abilities/Strengths  insightful, motivated, intelligent  Client Treatment Preferences  Outpatient Individual therapy and EMDR  Client Statement of Needs  "Dr. Dellia Cloud sent me to have EMDR for my trauma"  Treatment Level  Outpatient Individual therapy  Symptoms  Demonstrates an exaggerated startle response.: (Status: improved). Describes a reliving of the event, particularly through dissociative flashbacks.: (Status:  improved). Displays a significant decline in interest and engagement in activities.:  (Status: improved). Displays significant psychological and/or physiological distress resulting  from internal and external clues that are reminiscent of the traumatic event.:  (Status: improved). Experiences disturbing and persistent thoughts, images, and/or perceptions of the  traumatic event.: N(Status: improved). Has been exposed to a traumatic event  involving actual or perceived threat of death or serious injury.: (Status:  maintained). Impairment in social, occupational, or other areas of functioning.: (Status: improved). Intentionally avoids activities, places, people, or objects (e.g., up-armored vehicles) that evoke memories of the event.:  (Status: improved). Intentionally avoids  thoughts, feelings, or discussions related to the traumatic event.: (Status:  improved). Reports difficulty concentrating as well as feelings of guilt.:  (Status: improved). Reports hypervigilance.: N (Status: improved). Reports  response of intense fear, helplessness, or horror to the traumatic event.:  (Status: improved). Symptoms present more than one month.: (Status: improved).  Problems Addressed  Posttraumatic Stress Disorder (PTSD),  Goals 1. Eliminate or reduce the negative impact trauma related symptoms have  on social,  occupational, and family  functioning. Objective Participate in Eye Movement Desensitization and Reprocessing (EMDR) to reduce emotional distress  related to traumatic thoughts, feelings, and images. Target Date: 2023-05-23 Frequency: Biweekly Progress: 90 Modality: individual Related Interventions 1. Utilize Eye Movement Desensitization and Reprocessing (EMDR) to reduce the client's  emotional reactivity to the traumatic event and reduce PTSD symptoms. 2. No longer avoids persons, places, activities, and objects that are  reminiscent of the traumatic event. 3. No longer experiences intrusive event recollections, avoidance of event  reminders, intense arousal, or disinterest in activities or  relationships. 4. Returns to the level of psychological functioning prior to exposure to  the traumatic event. 5. Thinks about or openly discusses the traumatic event with others  without experiencing psychological or physiological distress. Diagnosis F43.10 (Posttraumatic stress disorder) - Open - [Signifier: n/a] Posttraumatic Stress  Disorder  Conditions For Discharge Achievement of treatment goals and objectives. The patient has approved this plan and is making progress.  Will see patient again in 4 weeks.  Maxcine Strong G Trevian Hayashida, LCSW

## 2023-02-14 ENCOUNTER — Encounter: Payer: Self-pay | Admitting: Family Medicine

## 2023-02-26 ENCOUNTER — Ambulatory Visit (INDEPENDENT_AMBULATORY_CARE_PROVIDER_SITE_OTHER): Payer: BC Managed Care – PPO | Admitting: Psychology

## 2023-02-26 DIAGNOSIS — F4310 Post-traumatic stress disorder, unspecified: Secondary | ICD-10-CM | POA: Diagnosis not present

## 2023-02-27 NOTE — Progress Notes (Signed)
[facility] Counselor/Therapist Progress Note  Patient ID: Abigail Singh, MRN: [id number],    Date: 02/26/2023  Time Spent: 60 minutes  Time in:  4:00 Time out:  5:00  Treatment Type: Individual Therapy  Reported Symptoms: Hx of flashbacks, worry, depression, history of hypervigilance.  Mental Status Exam: Appearance:  Casual     Behavior: Appropriate  Motor: Normal  Speech/Language:  Normal Rate  Affect: Blunt  Mood: pleasant  Thought process: normal  Thought content:   WNL  Sensory/Perceptual disturbances:   WNL  Orientation: oriented to person, place, time/date, and situation  Attention: Good  Concentration: Good  Memory: WNL  Fund of knowledge:  Good  Insight:   Good  Judgment:  Good  Impulse Control: Good   Risk Assessment: Danger to Self:  No Self-injurious Behavior: No Danger to Others: No Duty to Warn:no Physical Aggression / Violence:No  Access to Firearms a concern: No  Gang Involvement:No   Subjective: The patient attended a face-to-face individual therapy session in the office today.  The patient presents with a blunted affect and mood is pleasant.  The patient talked today about being frustrated about her shoulder injury.  She is getting treatment for that and seems to be managing that okay.  The patient talked about her children and her husband and the things that they have been dealing with.  The patient has done so much better because she is not as reactive to the trauma that she has experienced in the past.  She very rarely if ever mentions it in therapy and is doing very well.  She likes to come into talk about what is going on in her life and I explained to her that that was perfectly fine for [place] to do and we do that on a monthly basis.  Provided problem solving and cognitive behavioral therapy today. Interventions: Cognitive Behavioral Therapy, Insight-Oriented, and Interpersonal, Problem solving  Diagnosis:PTSD (post-traumatic stress  disorder)  Plan: Client Abilities/Strengths  insightful, motivated, intelligent  Client Treatment Preferences  Outpatient Individual therapy and EMDR  Client Statement of Needs  "Dr. Dellia Cloud sent me to have EMDR for my trauma"  Treatment Level  Outpatient Individual therapy  Symptoms  Demonstrates an exaggerated startle response.: (Status: improved). Describes a reliving of the event, particularly through dissociative flashbacks.: (Status:  improved). Displays a significant decline in interest and engagement in activities.:  (Status: improved). Displays significant psychological and/or physiological distress resulting  from internal and external clues that are reminiscent of the traumatic event.:  (Status: improved). Experiences disturbing and persistent thoughts, images, and/or perceptions of the  traumatic event.: N(Status: improved). Has been exposed to a traumatic event  involving actual or perceived threat of death or serious injury.: (Status:  maintained). Impairment in social, occupational, or other areas of functioning.: (Status: improved). Intentionally avoids activities, places, people, or objects (e.g., up-armored vehicles) that evoke memories of the event.:  (Status: improved). Intentionally avoids  thoughts, feelings, or discussions related to the traumatic event.: (Status:  improved). Reports difficulty concentrating as well as feelings of guilt.:  (Status: improved). Reports hypervigilance.: N (Status: improved). Reports  response of intense fear, helplessness, or horror to the traumatic event.:  (Status: improved). Symptoms present more than one month.: (Status: improved).  Problems Addressed  Posttraumatic Stress Disorder (PTSD),  Goals 1. Eliminate or reduce the negative impact trauma related symptoms have  on social, occupational, and family functioning. Objective Participate in Eye Movement Desensitization and Reprocessing (EMDR) to reduce emotional distress   related  to traumatic thoughts, feelings, and images. Target Date: 2023-05-23 Frequency: Biweekly Progress: 90 Modality: individual Related Interventions 1. Utilize Eye Movement Desensitization and Reprocessing (EMDR) to reduce the client's  emotional reactivity to the traumatic event and reduce PTSD symptoms. 2. No longer avoids persons, places, activities, and objects that are  reminiscent of the traumatic event. 3. No longer experiences intrusive event recollections, avoidance of event  reminders, intense arousal, or disinterest in activities or  relationships. 4. Returns to the level of psychological functioning prior to exposure to  the traumatic event. 5. Thinks about or openly discusses the traumatic event with others  without experiencing psychological or physiological distress. Diagnosis F43.10 (Posttraumatic stress disorder) - Open - [Signifier: n/a] Posttraumatic Stress  Disorder  Conditions For Discharge Achievement of treatment goals and objectives. The patient has approved this plan and is making progress.  Will see patient again in 4 weeks.  Istvan Behar G [place], LCSW

## 2023-03-06 ENCOUNTER — Ambulatory Visit: Payer: BC Managed Care – PPO | Admitting: Psychology

## 2023-03-27 ENCOUNTER — Encounter: Payer: Self-pay | Admitting: Family Medicine

## 2023-04-01 ENCOUNTER — Encounter: Payer: Self-pay | Admitting: Family Medicine

## 2023-04-01 ENCOUNTER — Ambulatory Visit: Payer: BC Managed Care – PPO | Admitting: Family Medicine

## 2023-04-01 VITALS — BP 124/74 | HR 95 | Temp 97.8°F | Ht 64.0 in | Wt 192.2 lb

## 2023-04-01 DIAGNOSIS — R051 Acute cough: Secondary | ICD-10-CM

## 2023-04-01 LAB — POC INFLUENZA A&B (BINAX/QUICKVUE)
Influenza A, POC: NEGATIVE
Influenza B, POC: NEGATIVE

## 2023-04-01 LAB — POC COVID19 BINAXNOW: SARS Coronavirus 2 Ag: NEGATIVE

## 2023-04-01 MED ORDER — AZITHROMYCIN 250 MG PO TABS: ORAL_TABLET | ORAL | 0 refills | Status: DC

## 2023-04-01 NOTE — Progress Notes (Signed)
   Subjective:    Patient ID: Abigail Singh, female    DOB: 1975-12-19, 47 y.o.   MRN: [id number]  HPI URI- sxs include congestion, cough, sore throat.  No fever.  Sxs started last week.  + sick contacts.  Cough is productive.  + occasional wheeze.  Cough is painful.  + sinus pain.  No tooth pain.  + HA.  Pt has allergy to erythromycin but not azithromycin.     Review of Systems For ROS see HPI     Objective:   Physical Exam Vitals reviewed.  Constitutional:      General: She is not in acute distress.    Appearance: She is well-developed.  HENT:     Head: Normocephalic and atraumatic.     Right Ear: Tympanic membrane and ear canal normal.     Left Ear: Tympanic membrane and ear canal normal.     Nose: Congestion present. No rhinorrhea.     Comments: No TTP over frontal or maxillary sinuses    Mouth/Throat:     Mouth: Mucous membranes are moist. No oral lesions.     Pharynx: Oropharynx is clear. No oropharyngeal exudate or posterior oropharyngeal erythema.  Eyes:     Extraocular Movements: EOM normal.     Conjunctiva/sclera: Conjunctivae normal.     Pupils: Pupils are equal, round, and reactive to light.  Cardiovascular:     Rate and Rhythm: Normal rate and regular rhythm.     Heart sounds: Normal heart sounds. No murmur heard. Pulmonary:     Effort: Pulmonary effort is normal. No respiratory distress.     Breath sounds: No wheezing.     Comments: Faint crackles of RLL Musculoskeletal:     Cervical back: Normal range of motion and neck supple.  Lymphadenopathy:     Cervical: No cervical adenopathy.  Skin:    General: Skin is warm and dry.  Neurological:     Mental Status: She is alert and oriented to person, place, and time.           Assessment & Plan:  Acute cough- new.  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  Cough is not improving after 1 week of sxs.  Now w/ wheezing and fait RLL crackles on exam today.  Given the high community  prevalence of mycoplasma, will start Zpack.  Reviewed supportive care and red flags that should prompt return.  Pt expressed understanding and is in agreement w/ plan.

## 2023-04-01 NOTE — Patient Instructions (Signed)
Follow up as needed or as scheduled START the Zpack as directed Drink LOTS of fluids Robitussin or Delsym for cough REST! Call with any questions or concerns Hang in there!! Happy Holidays!

## 2023-04-03 ENCOUNTER — Ambulatory Visit: Payer: BC Managed Care – PPO | Admitting: Psychology

## 2023-04-08 ENCOUNTER — Other Ambulatory Visit: Payer: Self-pay | Admitting: Family Medicine

## 2023-04-08 DIAGNOSIS — F988 Other specified behavioral and emotional disorders with onset usually occurring in childhood and adolescence: Secondary | ICD-10-CM

## 2023-04-08 MED ORDER — AMPHETAMINE-DEXTROAMPHET ER 10 MG PO CP24: 20.0000 mg | ORAL_CAPSULE | Freq: Every day | ORAL | 0 refills | Status: DC

## 2023-04-08 NOTE — Telephone Encounter (Signed)
Requested Prescriptions   Pending Prescriptions Disp Refills   amphetamine-dextroamphetamine (ADDERALL XR) 10 MG 24 hr capsule 60 capsule 0    Sig: Take 2 capsules (20 mg total) by mouth daily.     Date of patient request: 05/08/2022 Last office visit: 04/01/2023 Upcoming visit: 07/17/2023 Date of last refill: 01/30/2023 Last refill amount: #60 0 refills

## 2023-04-19 ENCOUNTER — Ambulatory Visit (INDEPENDENT_AMBULATORY_CARE_PROVIDER_SITE_OTHER): Payer: BC Managed Care – PPO | Admitting: Psychology

## 2023-04-19 DIAGNOSIS — F4310 Post-traumatic stress disorder, unspecified: Secondary | ICD-10-CM | POA: Diagnosis not present

## 2023-04-21 NOTE — Progress Notes (Signed)
[facility] Counselor/Therapist Progress Note  Patient ID: Abigail Singh, MRN: [id number],    Date: 04/19/2023  Time Spent: 60 minutes  Time in:  4:00 Time out:  5:00  Treatment Type: Individual Therapy  Reported Symptoms: Hx of flashbacks, worry, depression, history of hypervigilance.  Mental Status Exam: Appearance:  Casual     Behavior: Appropriate  Motor: Normal  Speech/Language:  Normal Rate  Affect: Blunt  Mood: pleasant  Thought process: normal  Thought content:   WNL  Sensory/Perceptual disturbances:   WNL  Orientation: oriented to person, place, time/date, and situation  Attention: Good  Concentration: Good  Memory: WNL  Fund of knowledge:  Good  Insight:   Good  Judgment:  Good  Impulse Control: Good   Risk Assessment: Danger to Self:  No Self-injurious Behavior: No Danger to Others: No Duty to Warn:no Physical Aggression / Violence:No  Access to Firearms a concern: No  Gang Involvement:No   Subjective: The patient attended a face-to-face individual therapy session in the office today.  The patient presents with a blunted affect and mood is pleasant.  The patient reports that she continues to be frustrated with her shoulder injury.  She states that things seem to be going okay right now.  She is going to a conference for a whole week and is looking forward to that.  She states that she is just dealing with regular situations with her daughters being teenagers.  She would like to try to get to where she is exercising more.  We talked about how she might work toward that goal.    Interventions: Cognitive Behavioral Therapy, Insight-Oriented, and Interpersonal, Problem solving  Diagnosis:PTSD (post-traumatic stress disorder)  Plan: Client Abilities/Strengths  insightful, motivated, intelligent  Client Treatment Preferences  Outpatient Individual therapy and EMDR  Client Statement of Needs  "Dr. Dellia Cloud sent me to have EMDR for my trauma"   Treatment Level  Outpatient Individual therapy  Symptoms  Demonstrates an exaggerated startle response.: (Status: improved). Describes a reliving of the event, particularly through dissociative flashbacks.: (Status:  improved). Displays a significant decline in interest and engagement in activities.:  (Status: improved). Displays significant psychological and/or physiological distress resulting  from internal and external clues that are reminiscent of the traumatic event.:  (Status: improved). Experiences disturbing and persistent thoughts, images, and/or perceptions of the  traumatic event.: N(Status: improved). Has been exposed to a traumatic event  involving actual or perceived threat of death or serious injury.: (Status:  maintained). Impairment in social, occupational, or other areas of functioning.: (Status: improved). Intentionally avoids activities, places, people, or objects (e.g., up-armored vehicles) that evoke memories of the event.:  (Status: improved). Intentionally avoids  thoughts, feelings, or discussions related to the traumatic event.: (Status:  improved). Reports difficulty concentrating as well as feelings of guilt.:  (Status: improved). Reports hypervigilance.: N (Status: improved). Reports  response of intense fear, helplessness, or horror to the traumatic event.:  (Status: improved). Symptoms present more than one month.: (Status: improved).  Problems Addressed  Posttraumatic Stress Disorder (PTSD),  Goals 1. Eliminate or reduce the negative impact trauma related symptoms have  on social, occupational, and family functioning. Objective Participate in Eye Movement Desensitization and Reprocessing (EMDR) to reduce emotional distress  related to traumatic thoughts, feelings, and images. Target Date: 2024-05-22 Frequency: Biweekly Progress: 90 Modality: individual Related Interventions 1. Utilize Eye Movement Desensitization and Reprocessing (EMDR) to reduce the  client's  emotional reactivity to the traumatic event and reduce PTSD symptoms. 2. No longer  avoids persons, places, activities, and objects that are  reminiscent of the traumatic event. 3. No longer experiences intrusive event recollections, avoidance of event  reminders, intense arousal, or disinterest in activities or  relationships. 4. Returns to the level of psychological functioning prior to exposure to  the traumatic event. 5. Thinks about or openly discusses the traumatic event with others  without experiencing psychological or physiological distress. Diagnosis F43.10 (Posttraumatic stress disorder) - Open - [Signifier: n/a] Posttraumatic Stress  Disorder  Conditions For Discharge Achievement of treatment goals and objectives. The patient has approved this plan and is making progress.  Will see patient again in 4 weeks.  Kenyette Gundy G Syana Degraffenreid, LCSW

## 2023-05-29 ENCOUNTER — Ambulatory Visit: Payer: 59 | Admitting: Psychology

## 2023-05-29 DIAGNOSIS — F4310 Post-traumatic stress disorder, unspecified: Secondary | ICD-10-CM

## 2023-05-29 NOTE — Progress Notes (Signed)
[facility] Counselor/Therapist Progress Note  Patient ID: Abigail Singh, MRN: [id number],    Date: 05/29/2023  Time Spent: 60 minutes  Time in:  4:00 Time out:  5:00  Treatment Type: Individual Therapy  Reported Symptoms: Hx of flashbacks, worry, depression, history of hypervigilance.  Mental Status Exam: Appearance:  Casual     Behavior: Appropriate  Motor: Normal  Speech/Language:  Normal Rate  Affect: Blunt  Mood: pleasant  Thought process: normal  Thought content:   WNL  Sensory/Perceptual disturbances:   WNL  Orientation: oriented to person, place, time/date, and situation  Attention: Good  Concentration: Good  Memory: WNL  Fund of knowledge:  Good  Insight:   Good  Judgment:  Good  Impulse Control: Good   Risk Assessment: Danger to Self:  No Self-injurious Behavior: No Danger to Others: No Duty to Warn:no Physical Aggression / Violence:No  Access to Firearms a concern: No  Gang Involvement:No   Subjective: The patient attended a face-to-face individual therapy session in the office today.  The patient presents with a blunted affect and mood is pleasant.  The patient reports that she is still getting treatment for her shoulder injury.  She states that they are giving her cortisone shots and that she has gained 15 pounds in the last 4 months.  We talked about the things that are causing her stress and it seems that she is worried about the inauguration and the leadership of our country.  We talked about trying to keep her self as neutral as possible.  We talked about not doing what if thinking and I recommended that she do some meditation and mindfulness and recommended some books for her that will help her learn how to do that for herself.  She reports that she is not walking right now and that used to be a good meditative process for her.  In addition she reports that she crochets and that was good but she had made some presents for Christmas and there  was some joking around it and that caused her to not want to do it anymore.  I encouraged her to continue to try to do those things as they are helpful but we talked about her shoulder injury probably affecting her mood as well as the medication that she is on for that. Interventions: Cognitive Behavioral Therapy, Insight-Oriented, and Interpersonal, Problem solving  Diagnosis:PTSD (post-traumatic stress disorder)  Plan: Client Abilities/Strengths  insightful, motivated, intelligent  Client Treatment Preferences  Outpatient Individual therapy and EMDR  Client Statement of Needs  "Dr. Dellia Cloud sent me to have EMDR for my trauma"  Treatment Level  Outpatient Individual therapy  Symptoms  Demonstrates an exaggerated startle response.: (Status: improved). Describes a reliving of the event, particularly through dissociative flashbacks.: (Status:  improved). Displays a significant decline in interest and engagement in activities.:  (Status: improved). Displays significant psychological and/or physiological distress resulting  from internal and external clues that are reminiscent of the traumatic event.:  (Status: improved). Experiences disturbing and persistent thoughts, images, and/or perceptions of the  traumatic event.: N(Status: improved). Has been exposed to a traumatic event  involving actual or perceived threat of death or serious injury.: (Status:  maintained). Impairment in social, occupational, or other areas of functioning.: (Status: improved). Intentionally avoids activities, places, people, or objects (e.g., up-armored vehicles) that evoke memories of the event.:  (Status: improved). Intentionally avoids  thoughts, feelings, or discussions related to the traumatic event.: (Status:  improved). Reports difficulty concentrating as well as  feelings of guilt.:  (Status: improved). Reports hypervigilance.: N (Status: improved). Reports  response of intense fear, helplessness, or horror  to the traumatic event.:  (Status: improved). Symptoms present more than one month.: (Status: improved).  Problems Addressed  Posttraumatic Stress Disorder (PTSD),  Goals 1. Eliminate or reduce the negative impact trauma related symptoms have  on social, occupational, and family functioning. Objective Participate in Eye Movement Desensitization and Reprocessing (EMDR) to reduce emotional distress  related to traumatic thoughts, feelings, and images. Target Date: 2024-05-22 Frequency: Biweekly Progress: 90 Modality: individual Related Interventions 1. Utilize Eye Movement Desensitization and Reprocessing (EMDR) to reduce the client's  emotional reactivity to the traumatic event and reduce PTSD symptoms. 2. No longer avoids persons, places, activities, and objects that are  reminiscent of the traumatic event. 3. No longer experiences intrusive event recollections, avoidance of event  reminders, intense arousal, or disinterest in activities or  relationships. 4. Returns to the level of psychological functioning prior to exposure to  the traumatic event. 5. Thinks about or openly discusses the traumatic event with others  without experiencing psychological or physiological distress. Diagnosis F43.10 (Posttraumatic stress disorder) - Open - [Signifier: n/a] Posttraumatic Stress  Disorder  Conditions For Discharge Achievement of treatment goals and objectives. The patient has approved this plan and is making progress.  Will see patient again in 4 weeks.  Jasan Doughtie G Komal Stangelo, LCSW

## 2023-05-31 ENCOUNTER — Other Ambulatory Visit: Payer: Self-pay | Admitting: Family Medicine

## 2023-05-31 DIAGNOSIS — F988 Other specified behavioral and emotional disorders with onset usually occurring in childhood and adolescence: Secondary | ICD-10-CM

## 2023-05-31 NOTE — Telephone Encounter (Signed)
Requested Prescriptions   Pending Prescriptions Disp Refills   amphetamine-dextroamphetamine (ADDERALL XR) 10 MG 24 hr capsule 60 capsule 0    Sig: Take 2 capsules (20 mg total) by mouth daily.     Date of patient request: 05/31/2023 Last office visit: 04/01/2023 Upcoming visit: 07/17/2023 Date of last refill: 04/08/2023 Last refill amount: 60

## 2023-06-03 MED ORDER — AMPHETAMINE-DEXTROAMPHET ER 10 MG PO CP24: 20.0000 mg | ORAL_CAPSULE | Freq: Every day | ORAL | 0 refills | Status: DC

## 2023-06-26 ENCOUNTER — Ambulatory Visit: Payer: BC Managed Care – PPO | Admitting: Psychology

## 2023-07-03 ENCOUNTER — Ambulatory Visit: Payer: 59 | Admitting: Psychology

## 2023-07-17 ENCOUNTER — Ambulatory Visit: Payer: 59 | Admitting: Family Medicine

## 2023-07-17 ENCOUNTER — Encounter: Payer: Self-pay | Admitting: Family Medicine

## 2023-07-17 VITALS — BP 118/72 | HR 80 | Temp 98.0°F | Ht 64.0 in | Wt 199.1 lb

## 2023-07-17 DIAGNOSIS — Z0000 Encounter for general adult medical examination without abnormal findings: Secondary | ICD-10-CM | POA: Diagnosis not present

## 2023-07-17 DIAGNOSIS — E559 Vitamin D deficiency, unspecified: Secondary | ICD-10-CM | POA: Diagnosis not present

## 2023-07-17 DIAGNOSIS — E538 Deficiency of other specified B group vitamins: Secondary | ICD-10-CM | POA: Diagnosis not present

## 2023-07-17 DIAGNOSIS — E669 Obesity, unspecified: Secondary | ICD-10-CM

## 2023-07-17 DIAGNOSIS — Z6834 Body mass index (BMI) 34.0-34.9, adult: Secondary | ICD-10-CM | POA: Diagnosis not present

## 2023-07-17 DIAGNOSIS — Z1159 Encounter for screening for other viral diseases: Secondary | ICD-10-CM

## 2023-07-17 LAB — BASIC METABOLIC PANEL
BUN: 14 mg/dL (ref 6–23)
CO2: 27 meq/L (ref 19–32)
Calcium: 9.5 mg/dL (ref 8.4–10.5)
Chloride: 105 meq/L (ref 96–112)
Creatinine, Ser: 0.74 mg/dL (ref 0.40–1.20)
GFR: 96.29 mL/min (ref >60.00)
Glucose, Bld: 101 mg/dL — ABNORMAL HIGH (ref 70–99)
Potassium: 4.8 meq/L (ref 3.5–5.1)
Sodium: 140 meq/L (ref 135–145)

## 2023-07-17 LAB — LIPID PANEL
Cholesterol: 185 mg/dL (ref 0–200)
HDL: 39 mg/dL — ABNORMAL LOW (ref >39.00)
LDL Cholesterol: 122 mg/dL — ABNORMAL HIGH (ref 0–99)
NonHDL: 145.62
Total CHOL/HDL Ratio: 5
Triglycerides: 117 mg/dL (ref 0.0–149.0)
VLDL: 23.4 mg/dL (ref 0.0–40.0)

## 2023-07-17 LAB — B12 AND FOLATE PANEL
Folate: 14.1 ng/mL (ref >5.9)
Vitamin B-12: 391 pg/mL (ref 211–911)

## 2023-07-17 LAB — CBC WITH DIFFERENTIAL/PLATELET
Basophils Absolute: 0 10*3/uL (ref 0.0–0.1)
Basophils Relative: 0.4 % (ref 0.0–3.0)
Eosinophils Absolute: 0.1 10*3/uL (ref 0.0–0.7)
Eosinophils Relative: 1.5 % (ref 0.0–5.0)
HCT: 44.1 % (ref 36.0–46.0)
Hemoglobin: 14.6 g/dL (ref 12.0–15.0)
Lymphocytes Relative: 26.7 % (ref 12.0–46.0)
Lymphs Abs: 1.8 10*3/uL (ref 0.7–4.0)
MCHC: 33.1 g/dL (ref 30.0–36.0)
MCV: 87.8 fl (ref 78.0–100.0)
Monocytes Absolute: 0.4 10*3/uL (ref 0.1–1.0)
Monocytes Relative: 6.5 % (ref 3.0–12.0)
Neutro Abs: 4.4 10*3/uL (ref 1.4–7.7)
Neutrophils Relative %: 64.9 % (ref 43.0–77.0)
Platelets: 275 10*3/uL (ref 150.0–400.0)
RBC: 5.03 Mil/uL (ref 3.87–5.11)
RDW: 13.5 % (ref 11.5–15.5)
WBC: 6.8 10*3/uL (ref 4.0–10.5)

## 2023-07-17 LAB — VITAMIN D 25 HYDROXY (VIT D DEFICIENCY, FRACTURES): VITD: 25.04 ng/mL — ABNORMAL LOW (ref 30.00–100.00)

## 2023-07-17 LAB — HEPATIC FUNCTION PANEL
ALT: 15 U/L (ref 0–35)
AST: 14 U/L (ref 0–37)
Albumin: 4.4 g/dL (ref 3.5–5.2)
Alkaline Phosphatase: 85 U/L (ref 39–117)
Bilirubin, Direct: 0.1 mg/dL (ref 0.0–0.3)
Total Bilirubin: 0.5 mg/dL (ref 0.2–1.2)
Total Protein: 7.3 g/dL (ref 6.0–8.3)

## 2023-07-17 LAB — TSH: TSH: 1.9 u[IU]/mL (ref 0.35–5.50)

## 2023-07-17 MED ORDER — PANTOPRAZOLE SODIUM 40 MG PO TBEC: DELAYED_RELEASE_TABLET | ORAL | 1 refills | Status: DC

## 2023-07-17 NOTE — Progress Notes (Unsigned)
   Subjective:    Patient ID: Abigail Singh, female    DOB: 20-Jul-1975, 48 y.o.   MRN: [id number]  HPI CPE- UTD on colonoscopy, Tdap.  Has pap and mammo scheduled.  Patient Care Team    Relationship Specialty Notifications Start End  Sheliah Hatch, MD PCP - General   04/19/10   Thomasene Ripple, DO PCP - Cardiology Cardiology  11/11/20   Maxie Better, MD Consulting Physician Obstetrics and Gynecology  11/26/16   Hilarie Fredrickson, MD Consulting Physician Gastroenterology  07/11/20   Nyoka Cowden, MD Consulting Physician Pulmonary Disease  07/11/20     Health Maintenance  Topic Date Due   Pneumococcal Vaccine 30-14 Years old (1 of 2 - PCV) Never done   Hepatitis C Screening  Never done   Cervical Cancer Screening (HPV/Pap Cotest)  03/19/2022   INFLUENZA VACCINE  12/06/2022   COVID-19 Vaccine (4 - 2024-25 season) 01/06/2023   MAMMOGRAM  05/15/2023   Colonoscopy  11/09/2026   DTaP/Tdap/Td (9 - Td or Tdap) 07/15/2032   HIV Screening  Completed   HPV VACCINES  Aged Out      Review of Systems Patient reports no vision/ hearing changes, adenopathy,fever, persistant/recurrent hoarseness , swallowing issues, chest pain, palpitations, edema, persistant/recurrent cough, hemoptysis, dyspnea (rest/exertional/paroxysmal nocturnal), gastrointestinal bleeding (melena, rectal bleeding), abdominal pain, significant heartburn, bowel changes, GU symptoms (dysuria, hematuria, incontinence), Gyn symptoms (abnormal  bleeding, pain),  syncope, focal weakness, memory loss, numbness & tingling, skin/hair/nail changes, abnormal bruising or bleeding, anxiety, or depression.   + 7 lb gain    Objective:   Physical Exam General Appearance:    Alert, cooperative, no distress, appears stated age  Head:    Normocephalic, without obvious abnormality, atraumatic  Eyes:    PERRL, conjunctiva/corneas clear, EOM's intact both eyes  Ears:    Normal TM's and external ear canals, both ears  Nose:   Nares normal,  septum midline, mucosa normal, no drainage    or sinus tenderness  Throat:   Lips, mucosa, and tongue normal; teeth and gums normal  Neck:   Supple, symmetrical, trachea midline, no adenopathy;    Thyroid: no enlargement/tenderness/nodules  Back:     Symmetric, no curvature, ROM normal, no CVA tenderness  Lungs:     Clear to auscultation bilaterally, respirations unlabored  Chest Wall:    No tenderness or deformity   Heart:    Regular rate and rhythm, S1 and S2 normal, no murmur, rub   or gallop  Breast Exam:    Deferred to GYN  Abdomen:     Soft, non-tender, bowel sounds active all four quadrants,    no masses, no organomegaly  Genitalia:    Deferred to GYN  Rectal:    Extremities:   Extremities normal, atraumatic, no cyanosis or edema  Pulses:   2+ and symmetric all extremities  Skin:   Skin color, texture, turgor normal, no rashes or lesions  Lymph nodes:   Cervical, supraclavicular, and axillary nodes normal  Neurologic:   CNII-XII intact, normal strength, sensation and reflexes    throughout          Assessment & Plan:

## 2023-07-17 NOTE — Patient Instructions (Signed)
 Follow up in 6 months to recheck weight loss progress We'll notify you of your lab results and make any changes if needed Continue to work on healthy diet and regular exercise- you can do it! Call with any questions or concerns Stay Safe!  Stay Healthy! Hang in there!!!

## 2023-07-18 ENCOUNTER — Encounter: Payer: Self-pay | Admitting: Family Medicine

## 2023-07-18 LAB — HEPATITIS C ANTIBODY: Hepatitis C Ab: NONREACTIVE

## 2023-07-18 NOTE — Assessment & Plan Note (Signed)
 Deteriorated.  Pt has gained 7 lbs since last visit.  Encouraged low carb diet and regular physical activity.  Check labs to risk stratify.  Will follow.

## 2023-07-18 NOTE — Assessment & Plan Note (Signed)
 Pt's PE WNL w/ excpetion of BMI.  UTD on colonoscopy, Tdap.  Pap and mammo scheduled.  Check labs.  Anticipatory guidance provided.

## 2023-07-19 MED ORDER — VITAMIN D (ERGOCALCIFEROL) 1.25 MG (50000 UNIT) PO CAPS: 50000.0000 [IU] | ORAL_CAPSULE | ORAL | 0 refills | Status: AC

## 2023-07-19 NOTE — Addendum Note (Signed)
 Addended by: Eldred Manges on: 07/19/2023 09:51 AM   Modules accepted: Orders

## 2023-07-24 ENCOUNTER — Other Ambulatory Visit: Payer: Self-pay | Admitting: Family Medicine

## 2023-07-24 ENCOUNTER — Ambulatory Visit (INDEPENDENT_AMBULATORY_CARE_PROVIDER_SITE_OTHER): Payer: BC Managed Care – PPO | Admitting: Psychology

## 2023-07-24 DIAGNOSIS — F988 Other specified behavioral and emotional disorders with onset usually occurring in childhood and adolescence: Secondary | ICD-10-CM

## 2023-07-24 DIAGNOSIS — F4310 Post-traumatic stress disorder, unspecified: Secondary | ICD-10-CM

## 2023-07-24 MED ORDER — AMPHETAMINE-DEXTROAMPHET ER 10 MG PO CP24: 20.0000 mg | ORAL_CAPSULE | Freq: Every day | ORAL | 0 refills | Status: DC
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
## 2023-08-19 LAB — HM MAMMOGRAPHY

## 2023-08-27 ENCOUNTER — Ambulatory Visit (INDEPENDENT_AMBULATORY_CARE_PROVIDER_SITE_OTHER): Admitting: Psychology

## 2023-08-27 DIAGNOSIS — F4323 Adjustment disorder with mixed anxiety and depressed mood: Secondary | ICD-10-CM | POA: Diagnosis not present

## 2023-08-27 DIAGNOSIS — F4310 Post-traumatic stress disorder, unspecified: Secondary | ICD-10-CM | POA: Diagnosis not present

## 2023-08-28 NOTE — Progress Notes (Signed)
 [facility] Counselor/Therapist Progress Note  Patient ID: Abigail Singh, MRN: [id number],    Date: 08/27/2023  Time Spent: 60 minutes  Time in:  4:00 Time out:  5:00  Treatment Type: Individual Therapy  Reported Symptoms: Hx of flashbacks, worry, depression, history of hypervigilance.  Mental Status Exam: Appearance:  Casual     Behavior: Appropriate  Motor: Normal  Speech/Language:  Normal Rate  Affect: Blunt  Mood: sad  Thought process: normal  Thought content:   WNL  Sensory/Perceptual disturbances:   WNL  Orientation: oriented to person, place, time/date, and situation  Attention: Good  Concentration: Good  Memory: WNL  Fund of knowledge:  Good  Insight:   Good  Judgment:  Good  Impulse Control: Good   Risk Assessment: Danger to Self:  No Self-injurious Behavior: No Danger to Others: No Duty to Warn:no Physical Aggression / Violence:No  Access to Firearms a concern: No  Gang Involvement:No   Subjective: The patient attended a face-to-face individual therapy session in the office today.  The patient presents with a blunted affect and mood is sad.  The patient reports that her husband's cancer has come back and it is in his adrenal gland.  We talked about how she felt about this and she does not seem to be really emotional about it right now and that may be her coping strategy just to deal with things.  At this point they do not have all the answers about what is going on with him and he has some testing coming up so that they can figure out what the next steps are.  We talked about her anxiety around this and she seems to be managing things right now.  They both tend to minimize things with their daughters and we will continue to offer support and her reframe things as she needs to in regards to his health.  I told her that if we needed to make more appointments we could do that moving forward. Interventions: Cognitive Behavioral Therapy,  Insight-Oriented, and Interpersonal, Problem solving  Diagnosis:PTSD (post-traumatic stress disorder)  Adjustment disorder with mixed anxiety and depressed mood  Plan: Client Abilities/Strengths  insightful, motivated, intelligent  Client Treatment Preferences  Outpatient Individual therapy and EMDR  Client Statement of Needs  "Dr. Onetha Bile sent me to have EMDR for my trauma"  Treatment Level  Outpatient Individual therapy  Symptoms  Demonstrates an exaggerated startle response.: (Status: improved). Describes a reliving of the event, particularly through dissociative flashbacks.: (Status:  improved). Displays a significant decline in interest and engagement in activities.:  (Status: improved). Displays significant psychological and/or physiological distress resulting  from internal and external clues that are reminiscent of the traumatic event.:  (Status: improved). Experiences disturbing and persistent thoughts, images, and/or perceptions of the  traumatic event.: N(Status: improved). Has been exposed to a traumatic event  involving actual or perceived threat of death or serious injury.: (Status:  maintained). Impairment in social, occupational, or other areas of functioning.: (Status: improved). Intentionally avoids activities, places, people, or objects (e.g., up-armored vehicles) that evoke memories of the event.:  (Status: improved). Intentionally avoids  thoughts, feelings, or discussions related to the traumatic event.: (Status:  improved). Reports difficulty concentrating as well as feelings of guilt.:  (Status: improved). Reports hypervigilance.: N (Status: improved). Reports  response of intense fear, helplessness, or horror to the traumatic event.:  (Status: improved). Symptoms present more than one month.: (Status: improved).  Problems Addressed  Posttraumatic Stress Disorder (PTSD),  Goals 1. Eliminate  or reduce the negative impact trauma related symptoms have  on  social, occupational, and family functioning. Objective Participate in Eye Movement Desensitization and Reprocessing (EMDR) to reduce emotional distress  related to traumatic thoughts, feelings, and images. Target Date: 2024-05-22 Frequency: Biweekly Progress: 90 Modality: individual Related Interventions 1. Utilize Eye Movement Desensitization and Reprocessing (EMDR) to reduce the client's  emotional reactivity to the traumatic event and reduce PTSD symptoms. 2. No longer avoids persons, places, activities, and objects that are  reminiscent of the traumatic event. 3. No longer experiences intrusive event recollections, avoidance of event  reminders, intense arousal, or disinterest in activities or  relationships. 4. Returns to the level of psychological functioning prior to exposure to  the traumatic event. 5. Thinks about or openly discusses the traumatic event with others  without experiencing psychological or physiological distress. Diagnosis F43.10 (Posttraumatic stress disorder) - Open - [Signifier: n/a] Posttraumatic Stress  Disorder  Conditions For Discharge Achievement of treatment goals and objectives. The patient has approved this plan and is making progress.  Will see patient again in 4 weeks.  Tynleigh Birt G Everard Interrante, LCSW

## 2023-09-13 ENCOUNTER — Encounter: Payer: Self-pay | Admitting: Family Medicine

## 2023-09-13 ENCOUNTER — Ambulatory Visit: Admitting: Family Medicine

## 2023-09-13 VITALS — BP 120/68 | HR 110 | Temp 99.5°F | Ht 64.0 in | Wt 198.5 lb

## 2023-09-13 DIAGNOSIS — J029 Acute pharyngitis, unspecified: Secondary | ICD-10-CM

## 2023-09-13 LAB — POCT RAPID STREP A (OFFICE): Rapid Strep A Screen: NEGATIVE

## 2023-09-13 MED ORDER — AZITHROMYCIN 250 MG PO TABS: ORAL_TABLET | ORAL | 0 refills | Status: DC

## 2023-09-13 NOTE — Patient Instructions (Signed)
 Follow up as needed or as scheduled START the Zpack as directed Drink LOTS of fluids Tylenol  and Ibuprofen  as needed for pain/fever Call with any questions or concerns Hang in there!!! HAPPY MOTHER'S DAY!!!

## 2023-09-13 NOTE — Progress Notes (Unsigned)
   Subjective:    Patient ID: Abigail Singh, female    DOB: 1975-07-10, 48 y.o.   MRN: [id number]  HPI Sore throat- sxs started suddenly last night w/ sore throat.  'it felt like my tongue was swollen'.  Throat now feels like it's 'on fire'.  R ear is throbbing.  Has upset stomach.  + low grade temp.  + chills, HA.  Very painful to swallow.   Review of Systems For ROS see HPI     Objective:   Physical Exam Vitals reviewed.  Constitutional:      General: She is not in acute distress.    Appearance: She is well-developed.  HENT:     Head: Normocephalic and atraumatic.     Right Ear: Tympanic membrane and ear canal normal.     Left Ear: Tympanic membrane and ear canal normal.     Nose: Nose normal. No congestion or rhinorrhea.     Mouth/Throat:     Pharynx: Posterior oropharyngeal erythema present.     Tonsils: Tonsillar exudate present. 2+ on the right. 2+ on the left.  Cardiovascular:     Rate and Rhythm: Normal rate and regular rhythm.     Heart sounds: Normal heart sounds.  Pulmonary:     Effort: Pulmonary effort is normal. No respiratory distress.     Breath sounds: Normal breath sounds. No wheezing or rales.  Musculoskeletal:     Cervical back: Normal range of motion and neck supple.  Lymphadenopathy:     Cervical: Cervical adenopathy present.  Neurological:     Mental Status: She is alert.           Assessment & Plan:   Sore throat- new.  Sxs and PE consistent w/ strep despite negative POCT.  Will start Zpack given PCN allergy.  Reviewed supportive care and red flags that should prompt return.  Pt expressed understanding and is in agreement w/ plan.

## 2023-09-18 ENCOUNTER — Ambulatory Visit (INDEPENDENT_AMBULATORY_CARE_PROVIDER_SITE_OTHER): Admitting: Psychology

## 2023-09-18 ENCOUNTER — Ambulatory Visit: Payer: 59 | Admitting: Psychology

## 2023-09-18 DIAGNOSIS — F4310 Post-traumatic stress disorder, unspecified: Secondary | ICD-10-CM

## 2023-09-18 DIAGNOSIS — F4323 Adjustment disorder with mixed anxiety and depressed mood: Secondary | ICD-10-CM | POA: Diagnosis not present

## 2023-09-19 NOTE — Progress Notes (Signed)
 [facility] Counselor/Therapist Progress Note  Patient ID: Abigail Singh, MRN: [id number],    Date: 09/18/2023  Time Spent: 60 minutes  Time in:  5:00 Time out:  6:00  Treatment Type: Individual Therapy  Reported Symptoms: Hx of flashbacks, worry, depression, history of hypervigilance.  Mental Status Exam: Appearance:  Casual     Behavior: Appropriate  Motor: Normal  Speech/Language:  Normal Rate  Affect: Blunt  Mood: sad  Thought process: normal  Thought content:   WNL  Sensory/Perceptual disturbances:   WNL  Orientation: oriented to person, place, time/date, and situation  Attention: Good  Concentration: Good  Memory: WNL  Fund of knowledge:  Good  Insight:   Good  Judgment:  Good  Impulse Control: Good   Risk Assessment: Danger to Self:  No Self-injurious Behavior: No Danger to Others: No Duty to Warn:no Physical Aggression / Violence:No  Access to Firearms a concern: No  Gang Involvement:No   Subjective: The patient attended a face-to-face individual therapy session in the office today.  The patient presents with a blunted affect and mood is sad.  The patient states that her husband is having more test because they feel like he does have cancer.  He is having a biopsy on June 12 and she reports that she is feeling a little anxious about that because she feels like he does not need to wait that long.  We talked about how she is feeling and it seems that she is still in shock pretty much.  We talked about some of the things she might experience and talked about how she is handling things.  In addition we talked about the importance of her getting things in order such as the wheel and having some structure to those things so that she does not have to be concerned about that too much if something should happen.  We are going to start seeing each other more often due to what is going on in her family. Interventions: Cognitive Behavioral Therapy,  Insight-Oriented, and Interpersonal, Problem solving  Diagnosis:PTSD (post-traumatic stress disorder)  Adjustment disorder with mixed anxiety and depressed mood  Plan: Client Abilities/Strengths  insightful, motivated, intelligent  Client Treatment Preferences  Outpatient Individual therapy and EMDR  Client Statement of Needs  "Dr. Onetha Bile sent me to have EMDR for my trauma"  Treatment Level  Outpatient Individual therapy  Symptoms  Demonstrates an exaggerated startle response.: (Status: improved). Describes a reliving of the event, particularly through dissociative flashbacks.: (Status:  improved). Displays a significant decline in interest and engagement in activities.:  (Status: improved). Displays significant psychological and/or physiological distress resulting  from internal and external clues that are reminiscent of the traumatic event.:  (Status: improved). Experiences disturbing and persistent thoughts, images, and/or perceptions of the  traumatic event.: N(Status: improved). Has been exposed to a traumatic event  involving actual or perceived threat of death or serious injury.: (Status:  maintained). Impairment in social, occupational, or other areas of functioning.: (Status: improved). Intentionally avoids activities, places, people, or objects (e.g., up-armored vehicles) that evoke memories of the event.:  (Status: improved). Intentionally avoids  thoughts, feelings, or discussions related to the traumatic event.: (Status:  improved). Reports difficulty concentrating as well as feelings of guilt.:  (Status: improved). Reports hypervigilance.: N (Status: improved). Reports  response of intense fear, helplessness, or horror to the traumatic event.:  (Status: improved). Symptoms present more than one month.: (Status: improved).  Problems Addressed  Posttraumatic Stress Disorder (PTSD),  Goals 1. Eliminate or  reduce the negative impact trauma related symptoms have  on  social, occupational, and family functioning. Objective Participate in Eye Movement Desensitization and Reprocessing (EMDR) to reduce emotional distress  related to traumatic thoughts, feelings, and images. Target Date: 2024-05-22 Frequency: Biweekly Progress: 90 Modality: individual Related Interventions 1. Utilize Eye Movement Desensitization and Reprocessing (EMDR) to reduce the client's  emotional reactivity to the traumatic event and reduce PTSD symptoms. 2. No longer avoids persons, places, activities, and objects that are  reminiscent of the traumatic event. 3. No longer experiences intrusive event recollections, avoidance of event  reminders, intense arousal, or disinterest in activities or  relationships. 4. Returns to the level of psychological functioning prior to exposure to  the traumatic event. 5. Thinks about or openly discusses the traumatic event with others  without experiencing psychological or physiological distress. Diagnosis F43.10 (Posttraumatic stress disorder) - Open - [Signifier: n/a] Posttraumatic Stress  Disorder  Conditions For Discharge Achievement of treatment goals and objectives. The patient has approved this plan and is making progress.  Will see patient again in 2 weeks.  Amare Bail G Allecia Bells, LCSW

## 2023-09-22 ENCOUNTER — Other Ambulatory Visit: Payer: Self-pay | Admitting: Cardiology

## 2023-09-24 ENCOUNTER — Ambulatory Visit: Admitting: Psychology

## 2023-10-03 ENCOUNTER — Encounter: Payer: Self-pay | Admitting: Emergency Medicine

## 2023-10-03 ENCOUNTER — Ambulatory Visit: Attending: Emergency Medicine | Admitting: Emergency Medicine

## 2023-10-03 VITALS — BP 90/72 | HR 77 | Ht 64.0 in | Wt 199.0 lb

## 2023-10-03 DIAGNOSIS — I471 Supraventricular tachycardia, unspecified: Secondary | ICD-10-CM | POA: Diagnosis not present

## 2023-10-03 DIAGNOSIS — E782 Mixed hyperlipidemia: Secondary | ICD-10-CM

## 2023-10-03 DIAGNOSIS — E669 Obesity, unspecified: Secondary | ICD-10-CM

## 2023-10-03 DIAGNOSIS — U099 Post covid-19 condition, unspecified: Secondary | ICD-10-CM

## 2023-10-03 MED ORDER — METOPROLOL SUCCINATE ER 25 MG PO TB24: ORAL_TABLET | ORAL | 1 refills | Status: AC

## 2023-10-03 NOTE — Progress Notes (Signed)
 Cardiology Office Note:    Date:  10/03/2023  ID:  Abigail Singh, DOB 11/26/1975, MRN [id number] PCP: Abigail Morita, MD  [facility] Providers Cardiologist:  Abigail Morin, DO       Patient Profile:       Chief Complaint: 1 year follow-up History of Present Illness:  Abigail Singh is a 48 y.o. female with visit-pertinent history of COVID-19 infection and long COVID syndrome with autonomic dysfunction, persistent tachycardia, obesity, fibromyalgia, migraine headaches with vertigo, familial hyperlipidemia  She established with cardiology service on 09/27/2020 due to palpitations.  She has been started on beta-blockers per PCP.  Metoprolol  was increased to 50 mg daily.  Echocardiogram 10/2020 showed LVEF 55 to 60%, no RWMA, grade 1 DD, normal RV size and function, normal PASP, no valvular abnormalities.  At follow-up video visit on 11/11/2020.  Patient was still noticing palpitations.  She was continued on Toprol -XL 50 mg in the a.m. and started on Toprol -XL 12.5 mg in the p.m.  She reported some lower extremity edema, she was started on Lasix  20 mg daily as needed.  She was last seen on 09/13/2022 via video visit.  She was doing well from a CV standpoint.  She has been experiencing some elevated blood pressures with an increased heart rates.  Thought this could have been related to COVID-19 diagnosis does amplify her symptoms.  Her metoprolol  was changed to 25 mg twice daily as she was experiencing tachycardia particularly at nighttime.   Discussed the use of AI scribe software for clinical note transcription with the patient, who gave verbal consent to proceed.  History of Present Illness Abigail Singh is a 48 year old female with long COVID who presents with symptoms of autonomic dysfunction and exercise intolerance.  Her symptoms started after diagnosed with COVID.   Her symptoms include waking up with shortness of breath, fluttering sensations, and vertigo-like feelings  while lying down. She is on metoprolol  previously taken 25 mg in the a.m. and 25 mg in the p.m., now taking two 25 mg tablets in the morning due to prior nighttime dose due causing dizziness and spinning sensations.  Exercise intolerance is significant, with heart rate dropping when walking up stairs and increasing when lying down. 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   Her blood pressure, initially high post-COVID, has returned to a low range of 90/72 which was her average prior to COVID.  However, overall she notes that she has been doing well as she has been dealing with these symptoms for several years.  She feels like her symptoms are stable.  She has the above complaints over these are nonacute for her.  Review of systems:  Please see the history of present illness. All other systems are reviewed and otherwise negative.      Studies Reviewed:    EKG Interpretation Date/Time:  Thursday Oct 03 2023 15:23:55 EDT Ventricular Rate:  77 PR Interval:  162 QRS Duration:  82 QT Interval:  364 QTC Calculation: 411 R Axis:   18  Text Interpretation: Normal sinus rhythm Normal ECG When compared with ECG of 24-Sep-2019 18:16, No significant change was found Confirmed by Abigail Singh [phone]) on 10/03/2023 3:53:09 PM    Echocardiogram 10/27/2020  1. Left ventricular ejection fraction, by estimation, is 55 to 60%. The  left ventricle has normal function. The left ventricle has no regional  wall motion abnormalities. Left ventricular diastolic  parameters are  consistent with Grade I diastolic  dysfunction (impaired relaxation).   2. Right ventricular systolic function is normal. The right ventricular  size is normal. There is normal pulmonary artery systolic pressure.   3. The mitral valve is normal in structure. No evidence of mitral valve  regurgitation. No evidence of mitral stenosis.   4. The aortic valve  is normal in structure. Aortic valve regurgitation is  not visualized. No aortic stenosis is present.   5. The inferior vena cava is normal in size with greater than 50%  respiratory variability, suggesting right atrial pressure of 3 mmHg.   Risk Assessment/Calculations:              Physical Exam:   VS:  BP 90/72 (BP Location: Left Arm, Patient Position: Sitting, Cuff Size: Large)   Pulse 77   Ht 5\' 4"  (1.626 m)   Wt 199 lb (90.3 kg)   BMI 34.16 kg/m    Wt Readings from Last 3 Encounters:  10/03/23 199 lb (90.3 kg)  09/13/23 198 lb 8 oz (90 kg)  07/17/23 199 lb 2 oz (90.3 kg)    GEN: Well nourished, well developed in no acute distress NECK: No JVD; No carotid bruits CARDIAC: RRR, no murmurs, rubs, gallops RESPIRATORY:  Clear to auscultation without rales, wheezing or rhonchi  ABDOMEN: Soft, non-tender, non-distended EXTREMITIES:  No edema; No acute deformity      Assessment and Plan:  Paroxysmal supraventricular tachycardia Long COVID syndrome H/o long COVID syndrome with autonomic dysfunction previously started on metoprolol  due to persistent tachycardia - Patient notes over the past year she felt like her symptoms have been stable.  She has not been experiencing as many increasing heart rates.  She notes her heart rate tends to decrease on exertion while she experiences increased heart rate at times at rest - Fortunately she denies any syncope or presyncope.  She experiences mild lightheadedness/dizziness more during times of exertion which she feels like her heart rate has been unchanged or actually decrease - Her blood pressure was also noted to be 90/72 which she notes to be similar to her pre-COVID blood pressure.  Notes she experienced persistent HTN post COVID. - Given her history of autonomic dysfunction and now heart rate blunted during times of exertion sounds like she is experiencing chronotropic incompetence - Pulse today remained stable at 77 bpm while walking in  the room - Will decrease metoprolol  XL from 50 mg to 37.5 mg daily - Maintain adequate hydration  Obesity BMI 34.16 Recommend DASH diet (high in vegetables, fruits, low-fat dairy products, whole grains, poultry, fish, and nuts and low in sweets, sugar-sweetened beverages, and red meats), salt restriction and increase physical activity.  - She plans to speak with her PCP regarding GLP-1 therapy  Hyperlipidemia LDL 122, HDL 39, TC 185 on 07/2023 No statin recommended at this time due to ASCVD of 0.7% - Recommend physical exercise and heart dieting     Dispo:  Return in about 6 months (around 04/04/2024).  Signed, Ava Boatman, NP

## 2023-10-03 NOTE — Patient Instructions (Addendum)
 Medication Instructions:  TAKE TWO METOPROLOL  SUCCINATE 25 MG TABLETS IN [facility] MORNING.   Lab Work: NONE   Testing/Procedures: NONE  Follow-Up: At [facility], you and your health needs are our priority.  As part of our continuing mission to provide you with exceptional heart care, our providers are all part of one team.  This team includes your primary Cardiologist (physician) and Advanced Practice Providers or APPs (Physician Assistants and Nurse Practitioners) who all work together to provide you with the care you need, when you need it.  Your next appointment:   6 MONTHS  Provider:   Kardie Tobb, DO OR [facility] Bobo, [place]

## 2023-10-09 ENCOUNTER — Encounter: Payer: Self-pay | Admitting: Family Medicine

## 2023-10-09 DIAGNOSIS — F988 Other specified behavioral and emotional disorders with onset usually occurring in childhood and adolescence: Secondary | ICD-10-CM

## 2023-10-09 MED ORDER — AMPHETAMINE-DEXTROAMPHET ER 10 MG PO CP24: 20.0000 mg | ORAL_CAPSULE | Freq: Every day | ORAL | 0 refills | Status: DC

## 2023-10-09 NOTE — Telephone Encounter (Signed)
 Requested Prescriptions   Pending Prescriptions Disp Refills   amphetamine -dextroamphetamine (ADDERALL XR) 10 MG 24 hr capsule 60 capsule 0    Sig: Take 2 capsules (20 mg total) by mouth daily.     Date of patient request: 10/09/2023 Last office visit: 09/13/2023 Upcoming visit: 01/17/2024 Date of last refill: 07/17/2023 Last refill amount: 60 tabs 0 refills

## 2023-10-16 ENCOUNTER — Ambulatory Visit (INDEPENDENT_AMBULATORY_CARE_PROVIDER_SITE_OTHER): Admitting: Psychology

## 2023-10-16 DIAGNOSIS — F4310 Post-traumatic stress disorder, unspecified: Secondary | ICD-10-CM | POA: Diagnosis not present

## 2023-10-16 DIAGNOSIS — F4323 Adjustment disorder with mixed anxiety and depressed mood: Secondary | ICD-10-CM

## 2023-10-18 NOTE — Progress Notes (Signed)
 [facility] Counselor/Therapist Progress Note  Patient ID: HAYLIN CAMILLI, MRN: [id number],    Date: 10/16/2023  Time Spent: 60 minutes  Time in:  4:00 Time out:  5:00  Treatment Type: Individual Therapy  Reported Symptoms: Hx of flashbacks, worry, depression, history of hypervigilance.  Mental Status Exam: Appearance:  Casual     Behavior: Appropriate  Motor: Normal  Speech/Language:  Normal Rate  Affect: Blunt  Mood: Somewhat sad and anxious  Thought process: normal  Thought content:   WNL  Sensory/Perceptual disturbances:   WNL  Orientation: oriented to person, place, time/date, and situation  Attention: Good  Concentration: Good  Memory: WNL  Fund of knowledge:  Good  Insight:   Good  Judgment:  Good  Impulse Control: Good   Risk Assessment: Danger to Self:  No Self-injurious Behavior: No Danger to Others: No Duty to Warn:no Physical Aggression / Violence:No  Access to Firearms a concern: No  Gang Involvement:No   Subjective: The patient attended a face-to-face individual therapy session in the office today.  The patient presents with a blunted affect and mood is somewhat sad and anxious.  The patient reports that her husband is having a biopsy on his adrenal gland tomorrow and she is hoping they will also do a biopsy of his lungs.  They are basically in a holding pattern until they get the results of these tests back.  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  She is trying to be positive about the situation and hoping that if it is cancer they can go ahead and get it out by removing his adrenal gland.  The good news is is that she will be out for the summer after today.  We are talking about whether we need to increase the frequency of her visits moving forward because of what is going on with her family.  We will do that if needed moving forward. Interventions: Cognitive Behavioral  Therapy, Insight-Oriented, and Interpersonal, Problem solving  Diagnosis:PTSD (post-traumatic stress disorder)  Adjustment disorder with mixed anxiety and depressed mood  Plan: Client Abilities/Strengths  insightful, motivated, intelligent  Client Treatment Preferences  Outpatient Individual therapy and EMDR  Client Statement of Needs  Dr. Onetha Bile sent me to have EMDR for my trauma  Treatment Level  Outpatient Individual therapy  Symptoms  Demonstrates an exaggerated startle response.: (Status: improved). Describes a reliving of the event, particularly through dissociative flashbacks.: (Status:  improved). Displays a significant decline in interest and engagement in activities.:  (Status: improved). Displays significant psychological and/or physiological distress resulting  from internal and external clues that are reminiscent of the traumatic event.:  (Status: improved). Experiences disturbing and persistent thoughts, images, and/or perceptions of the  traumatic event.: N(Status: improved). Has been exposed to a traumatic event  involving actual or perceived threat of death or serious injury.: (Status:  maintained). Impairment in social, occupational, or other areas of functioning.: (Status: improved). Intentionally avoids activities, places, people, or objects (e.g., up-armored vehicles) that evoke memories of the event.:  (Status: improved). Intentionally avoids  thoughts, feelings, or discussions related to the traumatic event.: (Status:  improved). Reports difficulty concentrating as well as feelings of guilt.:  (Status: improved). Reports hypervigilance.: N (Status: improved). Reports  response of intense fear, helplessness, or horror to the traumatic event.:  (Status: improved). Symptoms present more than one month.: (Status: improved).  Problems Addressed  Posttraumatic Stress Disorder (PTSD),  Goals 1.  Eliminate or reduce the negative impact trauma related symptoms have   on social, occupational, and family functioning. Objective Participate in Eye Movement Desensitization and Reprocessing (EMDR) to reduce emotional distress  related to traumatic thoughts, feelings, and images. Target Date: 2024-05-22 Frequency: Biweekly Progress: 90 Modality: individual Related Interventions 1. Utilize Eye Movement Desensitization and Reprocessing (EMDR) to reduce the client's  emotional reactivity to the traumatic event and reduce PTSD symptoms. 2. No longer avoids persons, places, activities, and objects that are  reminiscent of the traumatic event. 3. No longer experiences intrusive event recollections, avoidance of event  reminders, intense arousal, or disinterest in activities or  relationships. 4. Returns to the level of psychological functioning prior to exposure to  the traumatic event. 5. Thinks about or openly discusses the traumatic event with others  without experiencing psychological or physiological distress. Diagnosis F43.10 (Posttraumatic stress disorder) - Open - [Signifier: n/a] Posttraumatic Stress  Disorder  Conditions For Discharge Achievement of treatment goals and objectives. The patient has approved this plan and is making progress.  Will see patient again in 2 weeks.  Mieshia Pepitone G Manraj Yeo, LCSW

## 2023-10-30 ENCOUNTER — Ambulatory Visit: Admitting: Psychology

## 2023-10-30 DIAGNOSIS — F4310 Post-traumatic stress disorder, unspecified: Secondary | ICD-10-CM

## 2023-10-30 DIAGNOSIS — F4323 Adjustment disorder with mixed anxiety and depressed mood: Secondary | ICD-10-CM | POA: Diagnosis not present

## 2023-10-31 NOTE — Progress Notes (Signed)
 [facility] Counselor/Therapist Progress Note  Patient ID: Abigail Singh, MRN: [id number],    Date: 10/30/2023  Time Spent: 60 minutes  Time in:  5:10 Time out:  6:10  Treatment Type: Individual Therapy  Reported Symptoms: Hx of flashbacks, worry, depression, history of hypervigilance.  Mental Status Exam: Appearance:  Casual     Behavior: Appropriate  Motor: Normal  Speech/Language:  Normal Rate  Affect: Blunt  Mood: Somewhat sad and anxious  Thought process: normal  Thought content:   WNL  Sensory/Perceptual disturbances:   WNL  Orientation: oriented to person, place, time/date, and situation  Attention: Good  Concentration: Good  Memory: WNL  Fund of knowledge:  Good  Insight:   Good  Judgment:  Good  Impulse Control: Good   Risk Assessment: Danger to Self:  No Self-injurious Behavior: No Danger to Others: No Duty to Warn:no Physical Aggression / Violence:No  Access to Firearms a concern: No  Gang Involvement:No   Subjective: The patient attended a face-to-face individual therapy session in the office today.  The patient presents with a blunted affect and mood is somewhat sad and anxious.  Patient reports that she still is not sure what is exactly going on with her husband.  She said that the biopsy came back inconclusive and that it said something about a blood clot.  She went to an appointment with an oncologist with him and apparently the oncologist is encouraging them to go ahead and take out his adrenal gland because he feels like it probably is that and that he probably has cancer.  The oncologist had done some blood work and we talked about how the blood work turned out and it seems that it is possible he could have the cancer.  The patient talked about just being in the now and trying to figure out what the next steps are.  It seems that her husband's surgeon wanted them to wait for 3 months for him to have any kind of surgery.  We talked about how  she feels about that and she was feeling that she wanted to go ahead and had the surgery so that she could do it while she was out of school and so that they could go ahead and move on with their lives.  It seems that the surgeon and the oncologist might be disagreeing with the amount of time they need to wait for the surgery.  I suggested that he send his oncologist a message to see if there is a possibility that they can speed the surgery up since the surgeon cannot do it for at least 6 weeks.  Because of the numbers in his lab work it seems that he needs to go ahead and make that decision to have surgery because ultimately he will have to have it removed anyway.  The patient reports that she is taking 1 day at a time and that she is handling things as she usually does.  We will continue to provide her with supportive therapy.  Interventions: Cognitive Behavioral Therapy, Insight-Oriented, and Interpersonal, Problem solving  Diagnosis:PTSD (post-traumatic stress disorder)  Adjustment disorder with mixed anxiety and depressed mood  Plan: Client Abilities/Strengths  insightful, motivated, intelligent  Client Treatment Preferences  Outpatient Individual therapy and EMDR  Client Statement of Needs  Dr. Coni sent me to have EMDR for my trauma  Treatment Level  Outpatient Individual therapy  Symptoms  Demonstrates an exaggerated startle response.: (Status: improved). Describes a reliving of the event, particularly  through dissociative flashbacks.: (Status:  improved). Displays a significant decline in interest and engagement in activities.:  (Status: improved). Displays significant psychological and/or physiological distress resulting  from internal and external clues that are reminiscent of the traumatic event.:  (Status: improved). Experiences disturbing and persistent thoughts, images, and/or perceptions of the  traumatic event.: N(Status: improved). Has been exposed to a traumatic event   involving actual or perceived threat of death or serious injury.: (Status:  maintained). Impairment in social, occupational, or other areas of functioning.: (Status: improved). Intentionally avoids activities, places, people, or objects (e.g., up-armored vehicles) that evoke memories of the event.:  (Status: improved). Intentionally avoids  thoughts, feelings, or discussions related to the traumatic event.: (Status:  improved). Reports difficulty concentrating as well as feelings of guilt.:  (Status: improved). Reports hypervigilance.: N (Status: improved). Reports  response of intense fear, helplessness, or horror to the traumatic event.:  (Status: improved). Symptoms present more than one month.: (Status: improved).  Problems Addressed  Posttraumatic Stress Disorder (PTSD),  Goals 1. Eliminate or reduce the negative impact trauma related symptoms have  on social, occupational, and family functioning. Objective Participate in Eye Movement Desensitization and Reprocessing (EMDR) to reduce emotional distress  related to traumatic thoughts, feelings, and images. Target Date: 2024-05-22 Frequency: Biweekly Progress: 90 Modality: individual Related Interventions 1. Utilize Eye Movement Desensitization and Reprocessing (EMDR) to reduce the client's  emotional reactivity to the traumatic event and reduce PTSD symptoms. 2. No longer avoids persons, places, activities, and objects that are  reminiscent of the traumatic event. 3. No longer experiences intrusive event recollections, avoidance of event  reminders, intense arousal, or disinterest in activities or  relationships. 4. Returns to the level of psychological functioning prior to exposure to  the traumatic event. 5. Thinks about or openly discusses the traumatic event with others  without experiencing psychological or physiological distress. Diagnosis F43.10 (Posttraumatic stress disorder) - Open - [Signifier: n/a] Posttraumatic  Stress  Disorder  Conditions For Discharge Achievement of treatment goals and objectives. The patient has approved this plan and is making progress.  Will see patient again in 2 weeks.  Jennilee Demarco G Khaleed Holan, LCSW

## 2023-11-13 ENCOUNTER — Ambulatory Visit (INDEPENDENT_AMBULATORY_CARE_PROVIDER_SITE_OTHER): Admitting: Psychology

## 2023-11-13 DIAGNOSIS — F4323 Adjustment disorder with mixed anxiety and depressed mood: Secondary | ICD-10-CM

## 2023-11-13 DIAGNOSIS — F4310 Post-traumatic stress disorder, unspecified: Secondary | ICD-10-CM | POA: Diagnosis not present

## 2023-11-15 NOTE — Progress Notes (Signed)
 [facility] Counselor/Therapist Progress Note  Patient ID: Abigail Singh, MRN: [id number],    Date: 11/13/2023  Time Spent: 60 minutes  Time in:  4:00 Time out:  5:00  Treatment Type: Individual Therapy  Reported Symptoms: Hx of flashbacks, worry, depression, history of hypervigilance.  Mental Status Exam: Appearance:  Casual     Behavior: Appropriate  Motor: Normal  Speech/Language:  Normal Rate  Affect: Blunt  Mood: pleasant  Thought process: normal  Thought content:   WNL  Sensory/Perceptual disturbances:   WNL  Orientation: oriented to person, place, time/date, and situation  Attention: Good  Concentration: Good  Memory: WNL  Fund of knowledge:  Good  Insight:   Good  Judgment:  Good  Impulse Control: Good   Risk Assessment: Danger to Self:  No Self-injurious Behavior: No Danger to Others: No Duty to Warn:no Physical Aggression / Violence:No  Access to Firearms a concern: No  Gang Involvement:No   Subjective: The patient attended a face-to-face individual therapy session in the office today.  The patient presents with a blunted affect and mood is pleasant.  The patient reports that her husband is having surgery on Friday, July 18.  The patient reports that she is and planning mode and that is probably keeping her from over thinking this situation about her husband.  She reports that they have not found any more information out about the lab results or whether the situation is cancer but because his labs were elevated in 1 area they are pretty sure that it is possible that his cancer.  We talked about how she is going to deal with things and she seems to be managing things right now really okay.  I will continue to see her on a more regular basis depending on how things go with the surgery.  I want to make sure that I can provide her as much support as possible from an emotional standpoint. Interventions: Cognitive Behavioral Therapy, Insight-Oriented,  and Interpersonal, Problem solving  Diagnosis:PTSD (post-traumatic stress disorder)  Adjustment disorder with mixed anxiety and depressed mood  Plan: Client Abilities/Strengths  insightful, motivated, intelligent  Client Treatment Preferences  Outpatient Individual therapy and EMDR  Client Statement of Needs  Dr. Coni sent me to have EMDR for my trauma  Treatment Level  Outpatient Individual therapy  Symptoms  Demonstrates an exaggerated startle response.: (Status: improved). Describes a reliving of the event, particularly through dissociative flashbacks.: (Status:  improved). Displays a significant decline in interest and engagement in activities.:  (Status: improved). Displays significant psychological and/or physiological distress resulting  from internal and external clues that are reminiscent of the traumatic event.:  (Status: improved). Experiences disturbing and persistent thoughts, images, and/or perceptions of the  traumatic event.: N(Status: improved). Has been exposed to a traumatic event  involving actual or perceived threat of death or serious injury.: (Status:  maintained). Impairment in social, occupational, or other areas of functioning.: (Status: improved). Intentionally avoids activities, places, people, or objects (e.g., up-armored vehicles) that evoke memories of the event.:  (Status: improved). Intentionally avoids  thoughts, feelings, or discussions related to the traumatic event.: (Status:  improved). Reports difficulty concentrating as well as feelings of guilt.:  (Status: improved). Reports hypervigilance.: N (Status: improved). Reports  response of intense fear, helplessness, or horror to the traumatic event.:  (Status: improved). Symptoms present more than one month.: (Status: improved).  Problems Addressed  Posttraumatic Stress Disorder (PTSD),  Goals 1. Eliminate or reduce the negative impact trauma related symptoms have  on social, occupational,  and family functioning. Objective Participate in Eye Movement Desensitization and Reprocessing (EMDR) to reduce emotional distress  related to traumatic thoughts, feelings, and images. Target Date: 2024-05-22 Frequency: Biweekly Progress: 90 Modality: individual Related Interventions 1. Utilize Eye Movement Desensitization and Reprocessing (EMDR) to reduce the client's  emotional reactivity to the traumatic event and reduce PTSD symptoms. 2. No longer avoids persons, places, activities, and objects that are  reminiscent of the traumatic event. 3. No longer experiences intrusive event recollections, avoidance of event  reminders, intense arousal, or disinterest in activities or  relationships. 4. Returns to the level of psychological functioning prior to exposure to  the traumatic event. 5. Thinks about or openly discusses the traumatic event with others  without experiencing psychological or physiological distress. Diagnosis F43.10 (Posttraumatic stress disorder) - Open - [Signifier: n/a] Posttraumatic Stress  Disorder  Conditions For Discharge Achievement of treatment goals and objectives. The patient has approved this plan and is making progress.  Will see patient again in 2 weeks.  Earsie Humm G Aviraj Kentner, LCSW

## 2023-11-22 ENCOUNTER — Encounter: Payer: Self-pay | Admitting: Advanced Practice Midwife

## 2023-11-29 ENCOUNTER — Ambulatory Visit: Admitting: Psychology

## 2023-12-03 ENCOUNTER — Encounter: Payer: Self-pay | Admitting: Family Medicine

## 2023-12-03 DIAGNOSIS — F988 Other specified behavioral and emotional disorders with onset usually occurring in childhood and adolescence: Secondary | ICD-10-CM

## 2023-12-04 ENCOUNTER — Ambulatory Visit (INDEPENDENT_AMBULATORY_CARE_PROVIDER_SITE_OTHER): Admitting: Psychology

## 2023-12-04 DIAGNOSIS — F4323 Adjustment disorder with mixed anxiety and depressed mood: Secondary | ICD-10-CM

## 2023-12-04 DIAGNOSIS — F4310 Post-traumatic stress disorder, unspecified: Secondary | ICD-10-CM

## 2023-12-04 MED ORDER — AMPHETAMINE-DEXTROAMPHET ER 10 MG PO CP24: 20.0000 mg | ORAL_CAPSULE | Freq: Every day | ORAL | 0 refills | Status: DC

## 2023-12-04 NOTE — Telephone Encounter (Signed)
 Requested Prescriptions   Pending Prescriptions Disp Refills   amphetamine -dextroamphetamine (ADDERALL XR) 10 MG 24 hr capsule 60 capsule 0    Sig: Take 2 capsules (20 mg total) by mouth daily.     Date of patient request: 12/04/2023 Last office visit: 09/13/2023 Upcoming visit: 01/17/2024 Date of last refill: 10/09/2023 Last refill amount: 60 caps 0 refill

## 2023-12-05 NOTE — Progress Notes (Signed)
 [facility] Counselor/Therapist Progress Note  Patient ID: Abigail Singh, MRN: [id number],    Date: 12/04/2023  Time Spent: 60 minutes  Time in:  2:00 Time out:  3:00  Treatment Type: Individual Therapy  Reported Symptoms: Hx of flashbacks, worry, depression, history of hypervigilance.  Mental Status Exam: Appearance:  Casual     Behavior: Appropriate  Motor: Normal  Speech/Language:  Normal Rate  Affect: Blunt  Mood: anxious  Thought process: normal  Thought content:   WNL  Sensory/Perceptual disturbances:   WNL  Orientation: oriented to person, place, time/date, and situation  Attention: Good  Concentration: Good  Memory: WNL  Fund of knowledge:  Good  Insight:   Good  Judgment:  Good  Impulse Control: Good   Risk Assessment: Danger to Self:  No Self-injurious Behavior: No Danger to Others: No Duty to Warn:no Physical Aggression / Violence:No  Access to Firearms a concern: No  Gang Involvement:No   Subjective: The patient attended a face-to-face individual therapy session in the office today.  The patient presents with a blunted affect and mood is anxious.  The patient presents as pleasant but there is some level of anxiety underneath her exterior.  She has a tendency to hide the way she feels because she does not want to be hurt and does not want to show her emotions a lot of times.  The patient reports that her husband was diagnosed with stage IV kidney cancer.  She states that they have an appointment with the oncologist on Thursday where they talk about treatment options.  She is trying to keep herself calm and not jumping to conclusions as she needs to hear what it is that the doctor has to say.  I do believe she is feeling some anxiety and she and her family are supposed to be going to the beach with her parents next week but she is not sure that her husband will make it because it depends on his treatment options.  We talked about her taking care of  everybody and I encouraged her to do some good things for herself and even if her husband does not go if she feels that it safe to leave him at home that might be a better option so that she can have some time away for herself as well.  I will continue to provide supportive therapy while she and her family are going through this difficult time.  Interventions: Cognitive Behavioral Therapy, Insight-Oriented, and Interpersonal, Problem solving  Diagnosis:PTSD (post-traumatic stress disorder)  Adjustment disorder with mixed anxiety and depressed mood  Plan: Client Abilities/Strengths  insightful, motivated, intelligent  Client Treatment Preferences  Outpatient Individual therapy and EMDR  Client Statement of Needs  Dr. Coni sent me to have EMDR for my trauma  Treatment Level  Outpatient Individual therapy  Symptoms  Demonstrates an exaggerated startle response.: (Status: improved). Describes a reliving of the event, particularly through dissociative flashbacks.: (Status:  improved). Displays a significant decline in interest and engagement in activities.:  (Status: improved). Displays significant psychological and/or physiological distress resulting  from internal and external clues that are reminiscent of the traumatic event.:  (Status: improved). Experiences disturbing and persistent thoughts, images, and/or perceptions of the  traumatic event.: N(Status: improved). Has been exposed to a traumatic event  involving actual or perceived threat of death or serious injury.: (Status:  maintained). Impairment in social, occupational, or other areas of functioning.: (Status: improved). Intentionally avoids activities, places, people, or objects (e.g., up-armored vehicles) that  evoke memories of the event.:  (Status: improved). Intentionally avoids  thoughts, feelings, or discussions related to the traumatic event.: (Status:  improved). Reports difficulty concentrating as well as feelings of  guilt.:  (Status: improved). Reports hypervigilance.: N (Status: improved). Reports  response of intense fear, helplessness, or horror to the traumatic event.:  (Status: improved). Symptoms present more than one month.: (Status: improved).  Problems Addressed  Posttraumatic Stress Disorder (PTSD),  Goals 1. Eliminate or reduce the negative impact trauma related symptoms have  on social, occupational, and family functioning. Objective Participate in Eye Movement Desensitization and Reprocessing (EMDR) to reduce emotional distress  related to traumatic thoughts, feelings, and images. Target Date: 2024-05-22 Frequency: Biweekly Progress: 90 Modality: individual Related Interventions 1. Utilize Eye Movement Desensitization and Reprocessing (EMDR) to reduce the client's  emotional reactivity to the traumatic event and reduce PTSD symptoms. 2. No longer avoids persons, places, activities, and objects that are  reminiscent of the traumatic event. 3. No longer experiences intrusive event recollections, avoidance of event  reminders, intense arousal, or disinterest in activities or  relationships. 4. Returns to the level of psychological functioning prior to exposure to  the traumatic event. 5. Thinks about or openly discusses the traumatic event with others  without experiencing psychological or physiological distress. Diagnosis F43.10 (Posttraumatic stress disorder) - Open - [Signifier: n/a] Posttraumatic Stress  Disorder  Conditions For Discharge Achievement of treatment goals and objectives. The patient has approved this plan and is making progress.  Will see patient again in 2 weeks.  Chloie Loney G Briella Hobday, LCSW

## 2023-12-11 ENCOUNTER — Ambulatory Visit: Admitting: Psychology

## 2024-01-01 ENCOUNTER — Ambulatory Visit: Admitting: Psychology

## 2024-01-17 ENCOUNTER — Ambulatory Visit: Admitting: Family Medicine

## 2024-01-17 ENCOUNTER — Encounter: Payer: Self-pay | Admitting: Family Medicine

## 2024-01-17 ENCOUNTER — Ambulatory Visit: Payer: Self-pay | Admitting: Family Medicine

## 2024-01-17 VITALS — BP 112/64 | HR 81 | Temp 97.9°F | Ht 64.0 in | Wt 197.4 lb

## 2024-01-17 DIAGNOSIS — Z23 Encounter for immunization: Secondary | ICD-10-CM

## 2024-01-17 DIAGNOSIS — E669 Obesity, unspecified: Secondary | ICD-10-CM | POA: Diagnosis not present

## 2024-01-17 DIAGNOSIS — F419 Anxiety disorder, unspecified: Secondary | ICD-10-CM | POA: Diagnosis not present

## 2024-01-17 LAB — HEMOGLOBIN A1C: Hgb A1c MFr Bld: 6.1 % (ref 4.6–6.5)

## 2024-01-17 LAB — BASIC METABOLIC PANEL WITH GFR
BUN: 15 mg/dL (ref 6–23)
CO2: 28 meq/L (ref 19–32)
Calcium: 9.4 mg/dL (ref 8.4–10.5)
Chloride: 104 meq/L (ref 96–112)
Creatinine, Ser: 0.88 mg/dL (ref 0.40–1.20)
GFR: 77.94 mL/min (ref >60.00)
Glucose, Bld: 94 mg/dL (ref 70–99)
Potassium: 4.2 meq/L (ref 3.5–5.1)
Sodium: 139 meq/L (ref 135–145)

## 2024-01-17 LAB — CBC WITH DIFFERENTIAL/PLATELET
Basophils Absolute: 0 K/uL (ref 0.0–0.1)
Basophils Relative: 0.4 % (ref 0.0–3.0)
Eosinophils Absolute: 0.1 K/uL (ref 0.0–0.7)
Eosinophils Relative: 1.4 % (ref 0.0–5.0)
HCT: 42.6 % (ref 36.0–46.0)
Hemoglobin: 14.3 g/dL (ref 12.0–15.0)
Lymphocytes Relative: 28.9 % (ref 12.0–46.0)
Lymphs Abs: 1.7 K/uL (ref 0.7–4.0)
MCHC: 33.6 g/dL (ref 30.0–36.0)
MCV: 86.5 fl (ref 78.0–100.0)
Monocytes Absolute: 0.6 K/uL (ref 0.1–1.0)
Monocytes Relative: 9.7 % (ref 3.0–12.0)
Neutro Abs: 3.5 K/uL (ref 1.4–7.7)
Neutrophils Relative %: 59.6 % (ref 43.0–77.0)
Platelets: 255 K/uL (ref 150.0–400.0)
RBC: 4.92 Mil/uL (ref 3.87–5.11)
RDW: 13.7 % (ref 11.5–15.5)
WBC: 5.8 K/uL (ref 4.0–10.5)

## 2024-01-17 LAB — TSH: TSH: 1.5 u[IU]/mL (ref 0.35–5.50)

## 2024-01-17 LAB — LIPID PANEL
Cholesterol: 169 mg/dL (ref 0–200)
HDL: 36 mg/dL — ABNORMAL LOW (ref >39.00)
LDL Cholesterol: 109 mg/dL — ABNORMAL HIGH (ref 0–99)
NonHDL: 133.07
Total CHOL/HDL Ratio: 5
Triglycerides: 119 mg/dL (ref 0.0–149.0)
VLDL: 23.8 mg/dL (ref 0.0–40.0)

## 2024-01-17 LAB — HEPATIC FUNCTION PANEL
ALT: 17 U/L (ref 0–35)
AST: 21 U/L (ref 0–37)
Albumin: 4.3 g/dL (ref 3.5–5.2)
Alkaline Phosphatase: 87 U/L (ref 39–117)
Bilirubin, Direct: 0.1 mg/dL (ref 0.0–0.3)
Total Bilirubin: 0.4 mg/dL (ref 0.2–1.2)
Total Protein: 7.5 g/dL (ref 6.0–8.3)

## 2024-01-17 MED ORDER — BUPROPION HCL 75 MG PO TABS: 75.0000 mg | ORAL_TABLET | Freq: Two times a day (BID) | ORAL | 3 refills | Status: DC

## 2024-01-17 NOTE — Progress Notes (Signed)
 Patient reviewed normal labs and read Dr. Charis message

## 2024-01-17 NOTE — Progress Notes (Signed)
   Subjective:    Patient ID: Abigail Singh, female    DOB: 09/20/1975, 48 y.o.   MRN: [id number]  HPI Obesity- pt's weight and BMI are stable.  BMI 33.88.  Pt reports she is walking 30-60 minutes and is frustrated that she is not able to lose weight.  Has seen 3 separate nutritionists that haven't had much to say/recommend.  Pt has had very high stress levels- currently in therapy, doing meditation.  Husband has stage 4 cancer.  Wakes at night w/ racing thoughts   Review of Systems For ROS see HPI     Objective:   Physical Exam Vitals reviewed.  Constitutional:      General: She is not in acute distress.    Appearance: Normal appearance. She is obese. She is not ill-appearing.  HENT:     Head: Normocephalic and atraumatic.  Eyes:     Extraocular Movements: Extraocular movements intact.     Conjunctiva/sclera: Conjunctivae normal.  Cardiovascular:     Rate and Rhythm: Normal rate and regular rhythm.  Pulmonary:     Effort: Pulmonary effort is normal. No respiratory distress.  Musculoskeletal:     Cervical back: Normal range of motion and neck supple.  Lymphadenopathy:     Cervical: No cervical adenopathy.  Skin:    General: Skin is warm and dry.  Neurological:     General: No focal deficit present.     Mental Status: She is alert and oriented to person, place, and time.  Psychiatric:        Mood and Affect: Mood normal.        Behavior: Behavior normal.        Thought Content: Thought content normal.           Assessment & Plan:

## 2024-01-17 NOTE — Patient Instructions (Addendum)
 Follow up in 6 weeks to recheck mood We'll notify you of your lab results and make any changes if needed Start the Wellbutrin  twice daily Continue to work on healthy diet and regular exercise- you're doing great!! Call with any questions or concerns Hang in there!!!

## 2024-01-21 NOTE — Assessment & Plan Note (Signed)
 Ongoing issue.  Weight and BMI are stable.  She is frustrated b/c she is walking regularly, has seen 3 separate nutritionists, and weight is not changing.  Discussed impact that stress can have on weight.  Will start Wellbutrin  and monitor closely.  Pt expressed understanding and is in agreement w/ plan.

## 2024-01-21 NOTE — Assessment & Plan Note (Signed)
 New.  Pt has had very high stress recently.  Husband has stage 4 cancer, has teenage daughters, works in the school system.  She is currently in therapy and doing meditation but continues to have racing thoughts and difficulty sleeping.  Will start Wellbutrin  to help w/ anxiety and also possibly weight loss.  Pt expressed understanding and is in agreement w/ plan.

## 2024-01-22 ENCOUNTER — Encounter: Payer: Self-pay | Admitting: Family Medicine

## 2024-02-03 ENCOUNTER — Encounter: Payer: Self-pay | Admitting: Family Medicine

## 2024-02-03 DIAGNOSIS — F988 Other specified behavioral and emotional disorders with onset usually occurring in childhood and adolescence: Secondary | ICD-10-CM

## 2024-02-03 MED ORDER — AMPHETAMINE-DEXTROAMPHET ER 10 MG PO CP24: 20.0000 mg | ORAL_CAPSULE | Freq: Every day | ORAL | 0 refills | Status: DC

## 2024-02-03 NOTE — Telephone Encounter (Signed)
 Request received as PCP out of office.  Recent visit with PCP few weeks ago.  Controlled substance database reviewed.  #60 dextroamphetamine-amphetamine  ER 10 mg last filled on 12/04/2023, previously 10/09/2023, 07/24/2023.  Refill ordered.

## 2024-02-03 NOTE — Addendum Note (Signed)
 Addended by: Gyselle Matthew R on: 02/03/2024 06:52 PM   Modules accepted: Orders

## 2024-02-05 ENCOUNTER — Ambulatory Visit (INDEPENDENT_AMBULATORY_CARE_PROVIDER_SITE_OTHER): Admitting: Psychology

## 2024-02-05 DIAGNOSIS — F4323 Adjustment disorder with mixed anxiety and depressed mood: Secondary | ICD-10-CM

## 2024-02-05 DIAGNOSIS — F4310 Post-traumatic stress disorder, unspecified: Secondary | ICD-10-CM

## 2024-02-06 NOTE — Progress Notes (Signed)
 [facility] Counselor/Therapist Progress Note  Patient ID: ZUMA HUST, MRN: [id number],    Date: 02/05/2024  Time Spent: 60 minutes  Time in:  4:00 Time out:  5:00  Treatment Type: Individual Therapy  Reported Symptoms: Hx of flashbacks, worry, depression, history of hypervigilance.  Mental Status Exam: Appearance:  Casual     Behavior: Appropriate  Motor: Normal  Speech/Language:  Normal Rate  Affect: Blunt  Mood: pleasant  Thought process: normal  Thought content:   WNL  Sensory/Perceptual disturbances:   WNL  Orientation: oriented to person, place, time/date, and situation  Attention: Good  Concentration: Good  Memory: WNL  Fund of knowledge:  Good  Insight:   Good  Judgment:  Good  Impulse Control: Good   Risk Assessment: Danger to Self:  No Self-injurious Behavior: No Danger to Others: No Duty to Warn:no Physical Aggression / Violence:No  Access to Firearms a concern: No  Gang Involvement:No   Subjective: The patient attended a face-to-face individual therapy session in the office today.  The patient presents with a blunted affect and mood is pleasant.  The patient reports that her husband seems to be doing okay and that he is returned to work after his adrenal gland was removed.  He does have 3 spots that they are going to have to look at doing something with at some point.  She reports that she has not gotten any information from the oncologist about how long he may have to live but when she does her research it seems that about 5 to 10 years is the most that someone can we have with this type of cancer.  She seems to be going through the motions and taking care of everything.  She did process a situation that happened at work earlier.  We will continue to meet and I told her to contact me if she felt she needed something before her next appointment. Interventions: Cognitive Behavioral Therapy, Insight-Oriented, and Interpersonal, Problem  solving  Diagnosis:PTSD (post-traumatic stress disorder)  Adjustment disorder with mixed anxiety and depressed mood  Plan: Client Abilities/Strengths  insightful, motivated, intelligent  Client Treatment Preferences  Outpatient Individual therapy and EMDR  Client Statement of Needs  Dr. Coni sent me to have EMDR for my trauma  Treatment Level  Outpatient Individual therapy  Symptoms  Demonstrates an exaggerated startle response.: (Status: improved). Describes a reliving of the event, particularly through dissociative flashbacks.: (Status:  improved). Displays a significant decline in interest and engagement in activities.:  (Status: improved). Displays significant psychological and/or physiological distress resulting  from internal and external clues that are reminiscent of the traumatic event.:  (Status: improved). Experiences disturbing and persistent thoughts, images, and/or perceptions of the  traumatic event.: N(Status: improved). Has been exposed to a traumatic event  involving actual or perceived threat of death or serious injury.: (Status:  maintained). Impairment in social, occupational, or other areas of functioning.: (Status: improved). Intentionally avoids activities, places, people, or objects (e.g., up-armored vehicles) that evoke memories of the event.:  (Status: improved). Intentionally avoids  thoughts, feelings, or discussions related to the traumatic event.: (Status:  improved). Reports difficulty concentrating as well as feelings of guilt.:  (Status: improved). Reports hypervigilance.: N (Status: improved). Reports  response of intense fear, helplessness, or horror to the traumatic event.:  (Status: improved). Symptoms present more than one month.: (Status: improved).  Problems Addressed  Posttraumatic Stress Disorder (PTSD),  Goals 1. Eliminate or reduce the negative impact trauma related symptoms have  on  social, occupational, and family  functioning. Objective Participate in Eye Movement Desensitization and Reprocessing (EMDR) to reduce emotional distress  related to traumatic thoughts, feelings, and images. Target Date: 2024-05-22 Frequency: Biweekly Progress: 90 Modality: individual Related Interventions 1. Utilize Eye Movement Desensitization and Reprocessing (EMDR) to reduce the client's  emotional reactivity to the traumatic event and reduce PTSD symptoms. 2. No longer avoids persons, places, activities, and objects that are  reminiscent of the traumatic event. 3. No longer experiences intrusive event recollections, avoidance of event  reminders, intense arousal, or disinterest in activities or  relationships. 4. Returns to the level of psychological functioning prior to exposure to  the traumatic event. 5. Thinks about or openly discusses the traumatic event with others  without experiencing psychological or physiological distress. Diagnosis F43.10 (Posttraumatic stress disorder) - Open - [Signifier: n/a] Posttraumatic Stress  Disorder  Conditions For Discharge Achievement of treatment goals and objectives. The patient has approved this plan and is making progress.  Will see patient again in 2 weeks.  Desiray Orchard G Corinn Stoltzfus, LCSW

## 2024-02-13 ENCOUNTER — Other Ambulatory Visit: Payer: Self-pay | Admitting: Family Medicine

## 2024-02-14 ENCOUNTER — Encounter: Payer: Self-pay | Admitting: Family Medicine

## 2024-02-14 NOTE — Telephone Encounter (Signed)
 Patient notes she is breaking out in a rash when she takes wellbutrin , is there an alternative she can take?

## 2024-02-17 ENCOUNTER — Ambulatory Visit: Admitting: Family Medicine

## 2024-02-17 ENCOUNTER — Encounter: Payer: Self-pay | Admitting: Family Medicine

## 2024-02-17 VITALS — BP 110/62 | HR 100 | Temp 98.2°F | Resp 19 | Ht 64.0 in | Wt 197.2 lb

## 2024-02-17 DIAGNOSIS — J029 Acute pharyngitis, unspecified: Secondary | ICD-10-CM

## 2024-02-17 DIAGNOSIS — R509 Fever, unspecified: Secondary | ICD-10-CM

## 2024-02-17 DIAGNOSIS — J039 Acute tonsillitis, unspecified: Secondary | ICD-10-CM

## 2024-02-17 DIAGNOSIS — R059 Cough, unspecified: Secondary | ICD-10-CM

## 2024-02-17 LAB — POCT RAPID STREP A (OFFICE): Rapid Strep A Screen: NEGATIVE

## 2024-02-17 MED ORDER — AZITHROMYCIN 250 MG PO TABS: ORAL_TABLET | ORAL | 0 refills | Status: AC

## 2024-02-17 NOTE — Progress Notes (Signed)
 Subjective:  Patient ID: Abigail Singh, female    DOB: 1976/02/05  Age: 48 y.o. MRN: [id number]  CC:  Chief Complaint  Patient presents with   Nasal Congestion    Sx started Thursday. Fever that broke last night. COVID home test negative.    Ear Pain    Right ear   Cough    HPI ZURIE PLATAS presents for  Acute visit for above.  PCP is Dr. Mahlon.  Congestion, right ear pain and cough Initial symptoms started 4 days ago.  Lump in throat, then fever up to 101.7 tmax. Initial fever that improved last night. 99-100 range. Chills, cough - productive cough, sore in chest with cough. R ear pain started again today - noted last Thurs - resolved then returned.  No ear d/c. No change in hearing.  Home COVID testing was negative 3 days ago.   Sick contacts: Runner, broadcasting/film/video, multiple sick contacts - middle Engineer, site.  Tx: benadyl and tylenol .   Allergies reviewed - has taken zpak without significant side effects.    History Patient Active Problem List   Diagnosis Date Noted   Essential hypertension 03/08/2022   PSVT (paroxysmal supraventricular tachycardia) 02/22/2022   Low HDL (under 40) 12/28/2021   B12 deficiency 12/28/2021   Prediabetes 01/17/2021   Neuromuscular disorder (HCC)    Migraines    Fibroid    Anxiety    Anemia    Allergy    Tachycardia 09/02/2020   Autonomic instability 09/02/2020   Nocturnal hypoxemia 08/31/2020   Fatigue 08/31/2020   DOE (dyspnea on exertion) 07/08/2020   Depression 06/15/2020   Chronic cough 05/31/2020   Long COVID 05/31/2020   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   Seasonal allergic conjunctivitis 06/24/2018   Other allergic rhinitis 06/24/2018   Other dysphagia 06/24/2018   Fibromyalgia 12/05/2017   Vitamin D  deficiency 11/26/2016   Insomnia 11/26/2016   ADHD 11/26/2016   Obesity (BMI 30-39.9) 01/27/2016   Plantar fasciitis, bilateral 11/01/2014   Sialoadenitis 10/21/2013   Physical exam 10/23/2010    Thyroid  nodule 10/21/2009   Past Medical History:  Diagnosis Date   ADD (attention deficit disorder)    Allergy    sseasonal   Anemia    as teenager   Anxiety    Autonomic instability    Back pain    Chest pain    Chewing difficulty    Chronic fatigue    COVID-19 long hauler    Depression    Diastolic dysfunction 2022   Edema of both lower extremities    Fibroid    Fibromyalgia    Food allergy    GERD (gastroesophageal reflux disease)    Heartburn in pregnancy    History of fibromyalgia    Hypertension    IBS (irritable bowel syndrome)    Joint pain    Lactose intolerance    Migraines    Neuromuscular disorder (HCC)    fibromyalgia   Other hyperlipidemia    Pneumonia 05/2020   Postpartum care following cesarean delivery 03/17/2012   Preterm labor    SOB (shortness of breath)    Status post cesarean delivery (repeat x 2) 03/17/2012   Swallowing difficulty    Urinary tract infection    Vitamin D  deficiency    Past Surgical History:  Procedure Laterality Date   abnormal cells in colon/tatoo area     2012   CESAREAN SECTION  2009   CESAREAN SECTION  03/17/2012   Procedure: CESAREAN  SECTION;  Surgeon: Dickie DELENA Carder, MD;  Location: [facility];  Service: Obstetrics;  Laterality: N/A;  Repeat C/S.  EDD: 03/23/12   COLONOSCOPY     DILATION AND CURETTAGE OF UTERUS     POLYPECTOMY     tubes in ears     WISDOM TOOTH EXTRACTION     Allergies  Allergen Reactions   Aspirin Anaphylaxis    Pt reports being able to take ibuprofen  with no problems.   Citrus Hives and Swelling   Codeine Other (See Comments)    halucinations   Egg Protein-Containing Drug Products Other (See Comments)    Patient states ok with flu shots, etc. No anaphalaxis.  Eggs give stomach cramps with diarrhea. After diarrhea ok.-pt eats baked goods that contains eggs with no issues    Erythromycin Rash   Lactose Intolerance (Gi) Diarrhea    Diarrhea with lactose.   Penicillins Rash   Sulfonamide  Derivatives Rash    Childhood reaction, has taken recently with only stomach irritation.   Prior to Admission medications   Medication Sig Start Date End Date Taking? Authorizing Provider  acetaminophen  (TYLENOL ) 325 MG tablet Take 650 mg by mouth every 6 (six) hours as needed for moderate pain.    [provider]  albuterol  (VENTOLIN  HFA) 108 (90 Base) MCG/ACT inhaler Inhale 1 puff into the lungs as needed for wheezing or shortness of breath.    [provider]  amphetamine -dextroamphetamine (ADDERALL XR) 10 MG 24 hr capsule Take 2 capsules (20 mg total) by mouth daily. 02/03/24   Levora Reyes SAUNDERS, MD  Ascorbic Acid (VITAMIN C) 1000 MG tablet Take 1,000 mg by mouth daily.    [provider]  azelastine  (ASTELIN ) 0.1 % nasal spray Place 2 sprays into both nostrils 2 (two) times daily. 06/23/21   Kennyth Worth HERO, MD  azithromycin  (ZITHROMAX ) 250 MG tablet 2 tabs on day 1, 1 tab on day 2-5 Patient not taking: Reported on 02/17/2024 09/13/23   Tabori, Katherine E, MD  BIOTIN PO Take 200 mg by mouth daily.    [provider]  Budesonide -Formoterol  Fumarate (SYMBICORT  IN) Inhale 1 puff into the lungs as needed (shortness of breath).    [provider]  buPROPion  (WELLBUTRIN ) 75 MG tablet TAKE 1 TABLET BY MOUTH TWICE A DAY 02/13/24   Tabori, Katherine E, MD  Calcium Carbonate (CALCIUM 500 PO) Take 500 mg by mouth daily.    [provider]  chlorpheniramine (CHLOR-TRIMETON) 4 MG tablet Take 4 mg by mouth every 4 (four) hours as needed. 2AT BEDTIME    [provider]  Cholecalciferol (VITAMIN D ) 50 MCG (2000 UT) CAPS Take 2,000 Units by mouth daily.    [provider]  EPINEPHrine  0.3 mg/0.3 mL IJ SOAJ injection Inject 0.3 mLs (0.3 mg total) into the muscle as needed for anaphylaxis. 06/09/18   Tabori, Katherine E, MD  furosemide  (LASIX ) 20 MG tablet TAKE 1 TABLET (20 MG TOTAL) BY MOUTH AS NEEDED (BILATERAL LEG SWELLING). 03/26/22 01/17/24   Tabori, Katherine E, MD  ibuprofen  (ADVIL ) 200 MG tablet Take 400 mg by mouth as needed for moderate pain.    [provider]  medroxyPROGESTERone (DEPO-PROVERA) 150 MG/ML injection SMARTSIG:1 Milliliter(s) IM Every 12 Weeks 08/20/20   [provider]  metoprolol  succinate (TOPROL -XL) 25 MG 24 hr tablet TAKE TWO 25 MG TABLETS IN THE MORNING. 10/03/23   Fountain, Madison L, NP  MULTIPLE VITAMIN PO Take 1 capsule by mouth daily.    [provider]  pantoprazole  (PROTONIX ) 40 MG tablet TAKE 1 TABLET DAILY 07/17/23   Tabori, Katherine E, MD  Probiotic Product (PROBIOTIC-10 PO) Take by mouth.    [provider]  triamcinolone  (NASACORT ) 55 MCG/ACT AERO nasal inhaler Place 2 sprays into the nose daily. 11/24/19   Gladis Elsie BROCKS, PA-C  vitamin B-12 (CYANOCOBALAMIN ) 500 MCG tablet Take 500 mcg by mouth daily.    [provider]  Vitamin D , Ergocalciferol , (DRISDOL ) 1.25 MG (50000 UNIT) CAPS capsule Take 1 capsule (50,000 Units total) by mouth every 7 (seven) days. 07/19/23   Tabori, Katherine E, MD  Zinc  30 MG TABS Take 30 mg by mouth daily.    [provider]   Social History   Socioeconomic History   Marital status: Married    Spouse name: Not on file   Number of children: Not on file   Years of education: Not on file   Highest education level: Not on file  Occupational History   Not on file  Tobacco Use   Smoking status: Former    Current packs/day: 0.00    Average packs/day: 1 pack/day for 11.0 years (11.0 ttl pk-yrs)    Types: Cigarettes    Start date: 05/07/1986    Quit date: 05/07/1997    Years since quitting: 26.8   Smokeless tobacco: Never   Tobacco comments:    no E cigs  Vaping Use   Vaping status: Never Used  Substance and Sexual Activity   Alcohol use: No    Alcohol/week: 0.0 standard drinks of alcohol   Drug use: No   Sexual activity: Yes  Other Topics Concern   Not on file  Social History Narrative   Not on file    Social Drivers of Health   Financial Resource Strain: Not on file  Food Insecurity: Not on file  Transportation Needs: Not on file  Physical Activity: Not on file  Stress: Not on file  Social Connections: Not on file  Intimate Partner Violence: Not on file    Review of Systems Per HPI.   Objective:   Vitals:   02/17/24 0809  BP: 110/62  Pulse: 100  Resp: 19  Temp: 98.2 F (36.8 C)  TempSrc: Temporal  SpO2: 99%  Weight: 197 lb 3.2 oz (89.4 kg)  Height: 5' 4 (1.626 m)     Physical Exam Vitals reviewed.  Constitutional:      General: She is not in acute distress.    Appearance: She is well-developed.  HENT:     Head: Normocephalic and atraumatic.     Right Ear: Hearing, tympanic membrane, ear canal and external ear normal.     Left Ear: Hearing, tympanic membrane, ear canal and external ear normal.     Ears:     Comments: Bilateral ears without erythema, retraction, or effusion, canals clear.    Nose: Nose normal.     Mouth/Throat:     Mouth: Mucous membranes are moist.     Pharynx: Oropharyngeal exudate (Small amount of white exudate in shallow crypts of bilateral tonsils.  No tonsillar enlargement, no abscess, uvula midline.  Clearing secretions and vocalizing normally) present. No posterior oropharyngeal erythema.  Eyes:     Conjunctiva/sclera: Conjunctivae normal.     Pupils: Pupils are equal, round, and reactive to light.  Cardiovascular:     Rate and Rhythm: Normal rate and regular rhythm.     Heart sounds: Normal heart sounds. No murmur heard. Pulmonary:     Effort: Pulmonary effort  is normal. No respiratory distress.     Breath sounds: Normal breath sounds. No stridor. No wheezing or rhonchi.  Skin:    General: Skin is warm and dry.     Findings: No rash.  Neurological:     Mental Status: She is alert and oriented to person, place, and time.  Psychiatric:        Mood and Affect: Mood normal.        Behavior: Behavior normal.        Assessment & Plan:  ADENIKE SHIDLER is a 48 y.o. female . Fever, unspecified fever cause - Plan: POCT rapid strep A, Culture, Group A Strep, azithromycin  (ZITHROMAX ) 250 MG tablet  Cough, unspecified type  Sore throat - Plan: POCT rapid strep A, Culture, Group A Strep  Tonsillitis - Plan: azithromycin  (ZITHROMAX ) 250 MG tablet  Initial sore throat, fever, proceeded to cough with suspected viral illness, but exudative tonsillitis noted.  Negative rapid strep.  Will check throat culture.  Start azithromycin , has taken previously and has penicillin allergy.  No sign of otitis media at this time, suspected referred pain from throat.  Symptomatic care discussed, possible side effects of antibiotics discussed, RTC/ER precautions given.  Meds ordered this encounter  Medications   azithromycin  (ZITHROMAX ) 250 MG tablet    Sig: Take 2 tablets on day 1, then 1 tablet daily on days 2 through 5    Dispense:  6 tablet    Refill:  0   Patient Instructions  Rapid strep test was negative today.  I am sending throat culture as I did see a small amount of exudate or pus on the tonsils.  I do not see any sign of abscess.  However if you notice any increased swelling in the back of your throat, asymmetry will 1 side looks larger than the other, or the uvula in the center of the back of your throat is pushing to 1 side, be seen right away.  I do not expect that to occur.  I do not see any ear infection at this time, some of the ear pain could be referred from your throat.  Will start azithromycin  for now to cover for possible bacterial infection.  Sore throat lozenges like Cepacol can be helpful, okay to continue Tylenol .  Drink plenty of fluids, rest and be seen if any new or worsening symptoms.  Hope you feel better soon and thank you for coming in today.  Sore Throat A sore throat is pain, burning, irritation, or scratchiness in the throat. When you have a sore throat, you may feel pain or  tenderness in your throat when you swallow or talk. Many things can cause a sore throat, including: An infection. Seasonal allergies. Dryness in the air. Irritants, such as smoke or pollution. Radiation treatment for cancer. Gastroesophageal reflux disease (GERD). A tumor. A sore throat is often the first sign of another sickness. It may happen with other symptoms, such as coughing, sneezing, fever, and swollen neck glands. Most sore throats go away without medical treatment. Follow these instructions at home:     Medicines Take over-the-counter and prescription medicines only as told by your health care provider. Children often get sore throats. Do not give your child aspirin because of the association with Reye's syndrome. Use throat sprays to soothe your throat as told by your health care provider. Managing pain To help with pain, try: Sipping warm liquids, such as broth, herbal tea, or warm water. Eating or drinking cold or  frozen liquids, such as frozen ice pops. Gargling with a mixture of salt and water 3-4 times a day or as needed. To make salt water, completely dissolve -1 tsp (3-6 g) of salt in 1 cup (237 mL) of warm water. Sucking on hard candy or throat lozenges. Putting a cool-mist humidifier in your bedroom at night to moisten the air. Sitting in the bathroom with the door closed for 5-10 minutes while you run hot water in the shower. General instructions Do not use any products that contain nicotine or tobacco. These products include cigarettes, chewing tobacco, and vaping devices, such as e-cigarettes. If you need help quitting, ask your health care provider. Rest as needed. Drink enough fluid to keep your urine pale yellow. Wash your hands often with soap and water for at least 20 seconds. If soap and water are not available, use hand sanitizer. Contact a health care provider if: You have a fever for more than 2-3 days. You have symptoms that last for more than 2-3  days. Your throat does not get better within 7 days. You have a fever and your symptoms suddenly get worse. Get help right away if: You have difficulty breathing. You cannot swallow fluids, soft foods, or your saliva. You have increased swelling in your throat or neck. You have persistent nausea and vomiting. These symptoms may represent a serious problem that is an emergency. Do not wait to see if the symptoms will go away. Get medical help right away. Call your local emergency services (911 in the [place] Do not drive yourself to the hospital. Summary A sore throat is pain, burning, irritation, or scratchiness in the throat. Many things can cause a sore throat. Take over-the-counter medicines only as told by your health care provider. Rest as needed. Drink enough fluid to keep your urine pale yellow. Contact a health care provider if your throat does not get better within 7 days. This information is not intended to replace advice given to you by your health care provider. Make sure you discuss any questions you have with your health care provider. Document Revised: 07/20/2020 Document Reviewed: 07/20/2020 Elsevier Patient Education  2024 [employer]    Signed,   Reyes Pines, MD [facility], [facility] 02/17/24 8:59 AM

## 2024-02-17 NOTE — Patient Instructions (Signed)
 Rapid strep test was negative today.  I am sending throat culture as I did see a small amount of exudate or pus on the tonsils.  I do not see any sign of abscess.  However if you notice any increased swelling in the back of your throat, asymmetry will 1 side looks larger than the other, or the uvula in the center of the back of your throat is pushing to 1 side, be seen right away.  I do not expect that to occur.  I do not see any ear infection at this time, some of the ear pain could be referred from your throat.  Will start azithromycin  for now to cover for possible bacterial infection.  Sore throat lozenges like Cepacol can be helpful, okay to continue Tylenol .  Drink plenty of fluids, rest and be seen if any new or worsening symptoms.  Hope you feel better soon and thank you for coming in today.  Sore Throat A sore throat is pain, burning, irritation, or scratchiness in the throat. When you have a sore throat, you may feel pain or tenderness in your throat when you swallow or talk. Many things can cause a sore throat, including: An infection. Seasonal allergies. Dryness in the air. Irritants, such as smoke or pollution. Radiation treatment for cancer. Gastroesophageal reflux disease (GERD). A tumor. A sore throat is often the first sign of another sickness. It may happen with other symptoms, such as coughing, sneezing, fever, and swollen neck glands. Most sore throats go away without medical treatment. Follow these instructions at home:     Medicines Take over-the-counter and prescription medicines only as told by your health care provider. Children often get sore throats. Do not give your child aspirin because of the association with Reye's syndrome. Use throat sprays to soothe your throat as told by your health care provider. Managing pain To help with pain, try: Sipping warm liquids, such as broth, herbal tea, or warm water. Eating or drinking cold or frozen liquids, such as frozen ice  pops. Gargling with a mixture of salt and water 3-4 times a day or as needed. To make salt water, completely dissolve -1 tsp (3-6 g) of salt in 1 cup (237 mL) of warm water. Sucking on hard candy or throat lozenges. Putting a cool-mist humidifier in your bedroom at night to moisten the air. Sitting in the bathroom with the door closed for 5-10 minutes while you run hot water in the shower. General instructions Do not use any products that contain nicotine or tobacco. These products include cigarettes, chewing tobacco, and vaping devices, such as e-cigarettes. If you need help quitting, ask your health care provider. Rest as needed. Drink enough fluid to keep your urine pale yellow. Wash your hands often with soap and water for at least 20 seconds. If soap and water are not available, use hand sanitizer. Contact a health care provider if: You have a fever for more than 2-3 days. You have symptoms that last for more than 2-3 days. Your throat does not get better within 7 days. You have a fever and your symptoms suddenly get worse. Get help right away if: You have difficulty breathing. You cannot swallow fluids, soft foods, or your saliva. You have increased swelling in your throat or neck. You have persistent nausea and vomiting. These symptoms may represent a serious problem that is an emergency. Do not wait to see if the symptoms will go away. Get medical help right away. Call your local emergency  services (911 in the [place] Do not drive yourself to the hospital. Summary A sore throat is pain, burning, irritation, or scratchiness in the throat. Many things can cause a sore throat. Take over-the-counter medicines only as told by your health care provider. Rest as needed. Drink enough fluid to keep your urine pale yellow. Contact a health care provider if your throat does not get better within 7 days. This information is not intended to replace advice given to you by your health care  provider. Make sure you discuss any questions you have with your health care provider. Document Revised: 07/20/2020 Document Reviewed: 07/20/2020 Elsevier Patient Education  2024 [employer].

## 2024-02-19 ENCOUNTER — Ambulatory Visit: Payer: Self-pay | Admitting: Family Medicine

## 2024-02-19 LAB — CULTURE, GROUP A STREP
Micro Number: 17091119
SPECIMEN QUALITY:: ADEQUATE

## 2024-02-28 ENCOUNTER — Ambulatory Visit: Admitting: Family Medicine

## 2024-03-04 ENCOUNTER — Ambulatory Visit (INDEPENDENT_AMBULATORY_CARE_PROVIDER_SITE_OTHER): Admitting: Psychology

## 2024-03-04 DIAGNOSIS — F4323 Adjustment disorder with mixed anxiety and depressed mood: Secondary | ICD-10-CM

## 2024-03-04 DIAGNOSIS — F4310 Post-traumatic stress disorder, unspecified: Secondary | ICD-10-CM | POA: Diagnosis not present
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
## 2024-03-12 ENCOUNTER — Other Ambulatory Visit: Payer: Self-pay | Admitting: Nurse Practitioner

## 2024-03-12 ENCOUNTER — Ambulatory Visit: Payer: Self-pay | Admitting: Nurse Practitioner

## 2024-03-12 ENCOUNTER — Ambulatory Visit
Admission: RE | Admit: 2024-03-12 | Discharge: 2024-03-12 | Disposition: A | Discharge status: Home / Self Care | Source: Ambulatory Visit | Attending: Nurse Practitioner | Admitting: Nurse Practitioner

## 2024-03-12 DIAGNOSIS — S3992XA Unspecified injury of lower back, initial encounter: Secondary | ICD-10-CM

## 2024-03-24 ENCOUNTER — Ambulatory Visit: Admitting: Psychology

## 2024-03-24 DIAGNOSIS — F4310 Post-traumatic stress disorder, unspecified: Secondary | ICD-10-CM | POA: Diagnosis not present

## 2024-03-24 DIAGNOSIS — F4323 Adjustment disorder with mixed anxiety and depressed mood: Secondary | ICD-10-CM | POA: Diagnosis not present
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
## 2024-03-27 ENCOUNTER — Ambulatory Visit: Admitting: Psychology

## 2024-04-01 ENCOUNTER — Ambulatory Visit: Admitting: Psychology

## 2024-04-03 ENCOUNTER — Other Ambulatory Visit: Payer: Self-pay | Admitting: Family Medicine

## 2024-04-07 ENCOUNTER — Encounter: Payer: Self-pay | Admitting: Family Medicine

## 2024-04-07 DIAGNOSIS — F988 Other specified behavioral and emotional disorders with onset usually occurring in childhood and adolescence: Secondary | ICD-10-CM

## 2024-04-07 NOTE — Telephone Encounter (Signed)
 Requested Prescriptions   Pending Prescriptions Disp Refills   amphetamine -dextroamphetamine (ADDERALL XR) 10 MG 24 hr capsule 60 capsule 0    Sig: Take 2 capsules (20 mg total) by mouth daily.     Date of patient request: 04/07/2024 Last office visit: 01/17/2024 Upcoming visit: Visit date not found Date of last refill: 02/03/2024 Last refill amount: 60 caps 0 refills    Will send a mychart message to patient to schedule a follow up appointment.

## 2024-04-08 MED ORDER — AMPHETAMINE-DEXTROAMPHET ER 10 MG PO CP24: 20.0000 mg | ORAL_CAPSULE | Freq: Every day | ORAL | 0 refills | Status: DC

## 2024-04-09 ENCOUNTER — Ambulatory Visit: Admitting: Psychology

## 2024-04-28 ENCOUNTER — Ambulatory Visit: Admitting: Psychology

## 2024-04-28 DIAGNOSIS — F4323 Adjustment disorder with mixed anxiety and depressed mood: Secondary | ICD-10-CM

## 2024-04-28 DIAGNOSIS — F4310 Post-traumatic stress disorder, unspecified: Secondary | ICD-10-CM | POA: Diagnosis not present

## 2024-04-28 NOTE — Progress Notes (Signed)
 " [facility] Counselor/Therapist Progress Note  Patient ID: Abigail Singh, MRN: [id number],    Date: 04/28/2024  Time Spent: 58 minutes  Time in:  9:02 Time out:  10:00  Treatment Type: Individual Therapy  Reported Symptoms: Hx of flashbacks, worry, depression, history of hypervigilance.  Mental Status Exam: Appearance:  Casual     Behavior: Appropriate  Motor: Normal  Speech/Language:  Normal Rate  Affect: Blunt  Mood: pleasant  Thought process: normal  Thought content:   WNL  Sensory/Perceptual disturbances:   WNL  Orientation: oriented to person, place, time/date, and situation  Attention: Good  Concentration: Good  Memory: WNL  Fund of knowledge:  Good  Insight:   Good  Judgment:  Good  Impulse Control: Good   Risk Assessment: Danger to Self:  No Self-injurious Behavior: No Danger to Others: No Duty to Warn:no Physical Aggression / Violence:No  Access to Firearms a concern: No  Gang Involvement:No   Subjective: The patient attended a face-to-face individual therapy session in the office today.  The patient presents with a blunted affect and mood is pleasant.  The patient reports that they went on their trip and it was very good.  We talked about what they did and apparently they had a big argument at the end.  I do believe that her husband is being somewhat reactive and that some of its grief around him having cancer.  I think it was the end of the vacation and I think the realization hit him he potentially could be having issues because of the thoughts about him is not the way.  I encouraged her to encourage him to go get therapy but she does not think that he we will.  In addition we talked about how she is feeling and she is very sad and was tearful today during the session.  We talked about her continuing therapy may be at the [employer] office since she is aware of the therapist that works in this office and they worked with her children.  I am going  to see if Abigail Singh would be willing to take the patient on for therapy  Interventions: Cognitive Behavioral Therapy, Insight-Oriented, and Interpersonal, Problem solving  Diagnosis:PTSD (post-traumatic stress disorder)  Adjustment disorder with mixed anxiety and depressed mood  Plan: Client Abilities/Strengths  insightful, motivated, intelligent  Client Treatment Preferences  Outpatient Individual therapy and EMDR  Client Statement of Needs  Dr. Coni sent me to have EMDR for my trauma  Treatment Level  Outpatient Individual therapy  Symptoms  Demonstrates an exaggerated startle response.: (Status: improved). Describes a reliving of the event, particularly through dissociative flashbacks.: (Status:  improved). Displays a significant decline in interest and engagement in activities.:  (Status: improved). Displays significant psychological and/or physiological distress resulting  from internal and external clues that are reminiscent of the traumatic event.:  (Status: improved). Experiences disturbing and persistent thoughts, images, and/or perceptions of the  traumatic event.: N(Status: improved). Has been exposed to a traumatic event  involving actual or perceived threat of death or serious injury.: (Status:  maintained). Impairment in social, occupational, or other areas of functioning.: (Status: improved). Intentionally avoids activities, places, people, or objects (e.g., up-armored vehicles) that evoke memories of the event.:  (Status: improved). Intentionally avoids  thoughts, feelings, or discussions related to the traumatic event.: (Status:  improved). Reports difficulty concentrating as well as feelings of guilt.:  (Status: improved). Reports hypervigilance.: N (Status: improved). Reports  response of intense fear, helplessness, or  horror to the traumatic event.:  (Status: improved). Symptoms present more than one month.: (Status: improved).  Problems Addressed   Posttraumatic Stress Disorder (PTSD),  Goals 1. Eliminate or reduce the negative impact trauma related symptoms have  on social, occupational, and family functioning. Objective Participate in Eye Movement Desensitization and Reprocessing (EMDR) to reduce emotional distress  related to traumatic thoughts, feelings, and images. Target Date: 2025-05-22 Frequency: Biweekly Progress: 90 Modality: individual Related Interventions 1. Utilize Eye Movement Desensitization and Reprocessing (EMDR) to reduce the client's  emotional reactivity to the traumatic event and reduce PTSD symptoms. 2. No longer avoids persons, places, activities, and objects that are  reminiscent of the traumatic event. 3. No longer experiences intrusive event recollections, avoidance of event  reminders, intense arousal, or disinterest in activities or  relationships. 4. Returns to the level of psychological functioning prior to exposure to  the traumatic event. 5. Thinks about or openly discusses the traumatic event with others  without experiencing psychological or physiological distress. Diagnosis F43.10 (Posttraumatic stress disorder) - Open - [Signifier: n/a] Posttraumatic Stress  Disorder  Conditions For Discharge Achievement of treatment goals and objectives. The patient has approved this plan and is making progress.  Will see patient again in 2-4 weeks.  Senan Urey G Faelynn Wynder, LCSW                                                                                                            Siddhanth Denk G Alethia Melendrez, LCSW "

## 2024-05-27 ENCOUNTER — Ambulatory Visit: Admitting: Psychology

## 2024-05-27 DIAGNOSIS — F4310 Post-traumatic stress disorder, unspecified: Secondary | ICD-10-CM

## 2024-05-27 DIAGNOSIS — F4323 Adjustment disorder with mixed anxiety and depressed mood: Secondary | ICD-10-CM

## 2024-05-27 NOTE — Progress Notes (Signed)
 " [facility] Counselor/Therapist Progress Note  Patient ID: Abigail Singh, MRN: [id number],    Date: 05/26/2024  Time Spent: 60 minutes  Time in:  4:00 Time out:  5:00  Treatment Type: Individual Therapy  Reported Symptoms: Hx of flashbacks, worry, depression, history of hypervigilance.  Mental Status Exam: Appearance:  Casual     Behavior: Appropriate  Motor: Normal  Speech/Language:  Normal Rate  Affect: Blunt  Mood: pleasant  Thought process: normal  Thought content:   WNL  Sensory/Perceptual disturbances:   WNL  Orientation: oriented to person, place, time/date, and situation  Attention: Good  Concentration: Good  Memory: WNL  Fund of knowledge:  Good  Insight:   Good  Judgment:  Good  Impulse Control: Good   Risk Assessment: Danger to Self:  No Self-injurious Behavior: No Danger to Others: No Duty to Warn:no Physical Aggression / Violence:No  Access to Firearms a concern: No  Gang Involvement:No   Subjective: The patient attended a face-to-face individual therapy session in the office today.  The patient presents with a blunted affect and mood is pleasant.  The patient is continuing to handle a lot of things at home.  Her husband seems to be not handling the situation with his health very well at all and has not sought any kind of counseling and is pretty grumpy most of the time.  The patient reports that he has been very nostalgic and looking at all family photos and movies.  We talked about her encouraging him to go get some treatment because I think this is going to cause her problems as well.  The patient seems to be pretty shut off from her emotions about any of this and I am  going to encourage her to have sessions about every other week moving forward.  We talked about some things that are going on with her daughter at school because she is stressed about that and talked about better ways to take care of herself.  We also discussed her going to see  Rolin Pouch instead of Lavetta Herald because the location is more convenient for her and I will talk to [facility] about that and make a determination how she feels about seeing the patient for therapy  Interventions: Cognitive Behavioral Therapy, Insight-Oriented, and Interpersonal, Problem solving  Diagnosis:PTSD (post-traumatic stress disorder)  Adjustment disorder with mixed anxiety and depressed mood  Plan: Client Abilities/Strengths  insightful, motivated, intelligent  Client Treatment Preferences  Outpatient Individual therapy and EMDR  Client Statement of Needs  Dr. Coni sent me to have EMDR for my trauma  Treatment Level  Outpatient Individual therapy  Symptoms  Demonstrates an exaggerated startle response.: (Status: improved). Describes a reliving of the event, particularly through dissociative flashbacks.: (Status:  improved). Displays a significant decline in interest and engagement in activities.:  (Status: improved). Displays significant psychological and/or physiological distress resulting  from internal and external clues that are reminiscent of the traumatic event.:  (Status: improved). Experiences disturbing and persistent thoughts, images, and/or perceptions of the  traumatic event.: N(Status: improved). Has been exposed to a traumatic event  involving actual or perceived threat of death or serious injury.: (Status:  maintained). Impairment in social, occupational, or other areas of functioning.: (Status: improved). Intentionally avoids activities, places, people, or objects (e.g., up-armored vehicles) that evoke memories of the event.:  (Status: improved). Intentionally avoids  thoughts, feelings, or discussions related to the traumatic event.: (Status:  improved). Reports difficulty concentrating as well as feelings of guilt.:  (  Status: improved). Reports hypervigilance.: N (Status: improved). Reports  response of intense fear, helplessness, or horror to the  traumatic event.:  (Status: improved). Symptoms present more than one month.: (Status: improved).  Problems Addressed  Posttraumatic Stress Disorder (PTSD),  Goals 1. Eliminate or reduce the negative impact trauma related symptoms have  on social, occupational, and family functioning. Objective Participate in Eye Movement Desensitization and Reprocessing (EMDR) to reduce emotional distress  related to traumatic thoughts, feelings, and images. Target Date: 2025-05-22 Frequency: Biweekly Progress: 90 Modality: individual Related Interventions 1. Utilize Eye Movement Desensitization and Reprocessing (EMDR) to reduce the client's  emotional reactivity to the traumatic event and reduce PTSD symptoms. 2. No longer avoids persons, places, activities, and objects that are  reminiscent of the traumatic event. 3. No longer experiences intrusive event recollections, avoidance of event  reminders, intense arousal, or disinterest in activities or  relationships. 4. Returns to the level of psychological functioning prior to exposure to  the traumatic event. 5. Thinks about or openly discusses the traumatic event with others  without experiencing psychological or physiological distress. Diagnosis F43.10 (Posttraumatic stress disorder) - Open - [Signifier: n/a] Posttraumatic Stress  Disorder  Conditions For Discharge Achievement of treatment goals and objectives. The patient has approved this plan and is making progress.  Will see patient again in 2-4 weeks.  Gustabo Gordillo G Nailah Luepke, LCSW                                                                                                     "

## 2024-06-04 ENCOUNTER — Encounter: Payer: Self-pay | Admitting: Family Medicine

## 2024-06-04 DIAGNOSIS — F988 Other specified behavioral and emotional disorders with onset usually occurring in childhood and adolescence: Secondary | ICD-10-CM

## 2024-06-04 MED ORDER — AMPHETAMINE-DEXTROAMPHET ER 10 MG PO CP24: 20.0000 mg | ORAL_CAPSULE | Freq: Every day | ORAL | 0 refills | Status: DC

## 2024-06-04 NOTE — Telephone Encounter (Signed)
 Requested Prescriptions   Pending Prescriptions Disp Refills   amphetamine -dextroamphetamine (ADDERALL XR) 10 MG 24 hr capsule 60 capsule 0    Sig: Take 2 capsules (20 mg total) by mouth daily.     Date of patient request: 06/04/24 Last office visit: 01/17/2024 Upcoming visit: Visit date not found Date of last refill: 04/08/24 Last refill amount: 60

## 2024-06-09 MED ORDER — AMPHETAMINE-DEXTROAMPHET ER 20 MG PO CP24: 20.0000 mg | ORAL_CAPSULE | ORAL | 0 refills | Status: AC

## 2024-06-09 NOTE — Addendum Note (Signed)
 Addended by: Aliyah Abeyta E on: 06/09/2024 01:24 PM   Modules accepted: Orders

## 2024-06-09 NOTE — Telephone Encounter (Signed)
 Adderall 10 mg id currently out of stock at patient pharmacy can she be written 20 mg temporarily?

## 2024-06-10 ENCOUNTER — Ambulatory Visit: Admitting: Psychology

## 2024-06-10 DIAGNOSIS — F4323 Adjustment disorder with mixed anxiety and depressed mood: Secondary | ICD-10-CM

## 2024-06-10 DIAGNOSIS — F4310 Post-traumatic stress disorder, unspecified: Secondary | ICD-10-CM

## 2024-06-11 NOTE — Progress Notes (Signed)
 " [facility] Counselor/Therapist Progress Note  Patient ID: Abigail Singh, MRN: [id number],    Date: 06/10/2024  Time Spent: 60 minutes  Time in:  5:00 Time out:  6:00  Treatment Type: Individual Therapy  Reported Symptoms: Hx of flashbacks, worry, depression, history of hypervigilance.  Mental Status Exam: Appearance:  Casual     Behavior: Appropriate  Motor: Normal  Speech/Language:  Normal Rate  Affect: Blunt  Mood: pleasant  Thought process: normal  Thought content:   WNL  Sensory/Perceptual disturbances:   WNL  Orientation: oriented to person, place, time/date, and situation  Attention: Good  Concentration: Good  Memory: WNL  Fund of knowledge:  Good  Insight:   Good  Judgment:  Good  Impulse Control: Good   Risk Assessment: Danger to Self:  No Self-injurious Behavior: No Danger to Others: No Duty to Warn:no Physical Aggression / Violence:No  Access to Firearms a concern: No  Gang Involvement:No   Subjective: The patient attended a face-to-face individual therapy session in the office today.  The patient presents with a blunted affect and mood is pleasant.  The patient reports that she is having more difficulty with her youngest daughter.  We processed what is going on with her and we talked about how she is handling it and it seems that they are doing the best job they can but I do believe that she and her children as well as her husband are struggling to try to manage their emotions around his cancer diagnosis.  She seems to be managing it to the point of not having feelings at all at this point.  I do believe that she is going to need a little more support after I retire to deal with the potential loss that she is going to have to deal with.  We talked and processed parenting skills and talked about her going to see Rolin Pouch when I retire.  She does seem to be okay with that and Jasmine has given the okay for that to happen as  well. Interventions: Cognitive Behavioral Therapy, Insight-Oriented, and Interpersonal, Problem solving  Diagnosis:PTSD (post-traumatic stress disorder)  Adjustment disorder with mixed anxiety and depressed mood  Plan: Client Abilities/Strengths  insightful, motivated, intelligent  Client Treatment Preferences  Outpatient Individual therapy and EMDR  Client Statement of Needs  Dr. Coni sent me to have EMDR for my trauma  Treatment Level  Outpatient Individual therapy  Symptoms  Demonstrates an exaggerated startle response.: (Status: improved). Describes a reliving of the event, particularly through dissociative flashbacks.: (Status:  improved). Displays a significant decline in interest and engagement in activities.:  (Status: improved). Displays significant psychological and/or physiological distress resulting  from internal and external clues that are reminiscent of the traumatic event.:  (Status: improved). Experiences disturbing and persistent thoughts, images, and/or perceptions of the  traumatic event.: N(Status: improved). Has been exposed to a traumatic event  involving actual or perceived threat of death or serious injury.: (Status:  maintained). Impairment in social, occupational, or other areas of functioning.: (Status: improved). Intentionally avoids activities, places, people, or objects (e.g., up-armored vehicles) that evoke memories of the event.:  (Status: improved). Intentionally avoids  thoughts, feelings, or discussions related to the traumatic event.: (Status:  improved). Reports difficulty concentrating as well as feelings of guilt.:  (Status: improved). Reports hypervigilance.: N (Status: improved). Reports  response of intense fear, helplessness, or horror to the traumatic event.:  (Status: improved). Symptoms present more than one month.: (Status: improved).  Problems Addressed  Posttraumatic Stress Disorder (PTSD),  Goals 1. Eliminate or reduce the  negative impact trauma related symptoms have  on social, occupational, and family functioning. Objective Participate in Eye Movement Desensitization and Reprocessing (EMDR) to reduce emotional distress  related to traumatic thoughts, feelings, and images. Target Date: 2025-05-22 Frequency: Biweekly Progress: 90 Modality: individual Related Interventions 1. Utilize Eye Movement Desensitization and Reprocessing (EMDR) to reduce the client's  emotional reactivity to the traumatic event and reduce PTSD symptoms. 2. No longer avoids persons, places, activities, and objects that are  reminiscent of the traumatic event. 3. No longer experiences intrusive event recollections, avoidance of event  reminders, intense arousal, or disinterest in activities or  relationships. 4. Returns to the level of psychological functioning prior to exposure to  the traumatic event. 5. Thinks about or openly discusses the traumatic event with others  without experiencing psychological or physiological distress. Diagnosis F43.10 (Posttraumatic stress disorder) - Open - [Signifier: n/a] Posttraumatic Stress  Disorder  Conditions For Discharge Achievement of treatment goals and objectives. The patient has approved this plan and is making progress.  Will see patient again in 2-4 weeks.  Rozlynn Lippold G Jaedynn Bohlken, LCSW                                                                                                     "

## 2024-06-12 ENCOUNTER — Telehealth: Payer: Self-pay

## 2024-06-12 ENCOUNTER — Other Ambulatory Visit (HOSPITAL_COMMUNITY): Payer: Self-pay

## 2024-06-12 NOTE — Telephone Encounter (Signed)
 Pharmacy Patient Advocate Encounter  Received notification from [facility] that Prior Authorization for  Amphetamine -Dextroamphet ER 10MG  er capsules  has been APPROVED from 06/12/24 to 06/13/27   PA #/Case ID/Reference #: [id number]

## 2024-06-12 NOTE — Telephone Encounter (Signed)
 Pharmacy Patient Advocate Encounter   Received notification from [employer] that prior authorization for Amphetamine -Dextroamphet ER 10MG  er capsules is required/requested.   Insurance verification completed.   The patient is insured through [facility].   Per test claim: PA required; PA submitted to above mentioned insurance via Latent Key/confirmation #/EOC [id number] Status is pending

## 2024-06-24 ENCOUNTER — Ambulatory Visit: Admitting: Psychology

## 2024-07-09 ENCOUNTER — Ambulatory Visit: Admitting: Psychology

## 2024-07-22 ENCOUNTER — Ambulatory Visit: Admitting: Psychology

## 2024-08-04 ENCOUNTER — Ambulatory Visit: Admitting: Psychology
